# Patient Record
Sex: Male | Born: 1935 | Race: White | Hispanic: No | Smoking: Former smoker
Health system: Southern US, Community
[De-identification: ages and names within clinical notes are randomized; demographics above are authoritative.]

## PROBLEM LIST (undated history)

## (undated) DIAGNOSIS — I10 Essential (primary) hypertension: Secondary | ICD-10-CM

## (undated) DIAGNOSIS — N183 Chronic kidney disease, stage 3 unspecified: Secondary | ICD-10-CM

## (undated) DIAGNOSIS — C801 Malignant (primary) neoplasm, unspecified: Secondary | ICD-10-CM

## (undated) DIAGNOSIS — N3281 Overactive bladder: Secondary | ICD-10-CM

## (undated) DIAGNOSIS — N401 Enlarged prostate with lower urinary tract symptoms: Secondary | ICD-10-CM

## (undated) DIAGNOSIS — K2900 Acute gastritis without bleeding: Secondary | ICD-10-CM

## (undated) DIAGNOSIS — E119 Type 2 diabetes mellitus without complications: Secondary | ICD-10-CM

## (undated) DIAGNOSIS — H269 Unspecified cataract: Secondary | ICD-10-CM

## (undated) DIAGNOSIS — I495 Sick sinus syndrome: Secondary | ICD-10-CM

## (undated) DIAGNOSIS — K5900 Constipation, unspecified: Secondary | ICD-10-CM

## (undated) DIAGNOSIS — Z9109 Other allergy status, other than to drugs and biological substances: Secondary | ICD-10-CM

## (undated) DIAGNOSIS — K5730 Diverticulosis of large intestine without perforation or abscess without bleeding: Secondary | ICD-10-CM

## (undated) DIAGNOSIS — K224 Dyskinesia of esophagus: Secondary | ICD-10-CM

## (undated) DIAGNOSIS — I5030 Unspecified diastolic (congestive) heart failure: Secondary | ICD-10-CM

## (undated) DIAGNOSIS — E538 Deficiency of other specified B group vitamins: Secondary | ICD-10-CM

## (undated) DIAGNOSIS — K222 Esophageal obstruction: Secondary | ICD-10-CM

## (undated) DIAGNOSIS — K5790 Diverticulosis of intestine, part unspecified, without perforation or abscess without bleeding: Secondary | ICD-10-CM

## (undated) DIAGNOSIS — M1990 Unspecified osteoarthritis, unspecified site: Secondary | ICD-10-CM

## (undated) DIAGNOSIS — K219 Gastro-esophageal reflux disease without esophagitis: Secondary | ICD-10-CM

## (undated) DIAGNOSIS — Z86010 Personal history of colonic polyps: Secondary | ICD-10-CM

## (undated) DIAGNOSIS — I4891 Unspecified atrial fibrillation: Secondary | ICD-10-CM

## (undated) DIAGNOSIS — I441 Atrioventricular block, second degree: Secondary | ICD-10-CM

## (undated) DIAGNOSIS — Z800 Family history of malignant neoplasm of digestive organs: Secondary | ICD-10-CM

## (undated) DIAGNOSIS — Z950 Presence of cardiac pacemaker: Secondary | ICD-10-CM

## (undated) DIAGNOSIS — K449 Diaphragmatic hernia without obstruction or gangrene: Secondary | ICD-10-CM

## (undated) DIAGNOSIS — K56609 Unspecified intestinal obstruction, unspecified as to partial versus complete obstruction: Secondary | ICD-10-CM

## (undated) DIAGNOSIS — Z85828 Personal history of other malignant neoplasm of skin: Secondary | ICD-10-CM

## (undated) DIAGNOSIS — N138 Other obstructive and reflux uropathy: Secondary | ICD-10-CM

## (undated) HISTORY — DX: Benign prostatic hyperplasia with lower urinary tract symptoms: N40.1

## (undated) HISTORY — DX: Chronic kidney disease, stage 3 (moderate): N18.3

## (undated) HISTORY — DX: Presence of cardiac pacemaker: Z95.0

## (undated) HISTORY — DX: Unspecified atrial fibrillation: I48.91

## (undated) HISTORY — DX: Overactive bladder: N32.81

## (undated) HISTORY — DX: Dyskinesia of esophagus: K22.4

## (undated) HISTORY — DX: Sick sinus syndrome: I49.5

## (undated) HISTORY — DX: Chronic kidney disease, stage 3 unspecified: N18.30

## (undated) HISTORY — DX: Type 2 diabetes mellitus without complications: E11.9

## (undated) HISTORY — DX: Deficiency of other specified B group vitamins: E53.8

## (undated) HISTORY — PX: TOTAL HIP ARTHROPLASTY: SHX124

## (undated) HISTORY — PX: APPENDECTOMY: SHX54

## (undated) HISTORY — DX: Other obstructive and reflux uropathy: N13.8

## (undated) HISTORY — PX: EYE SURGERY: SHX253

## (undated) HISTORY — PX: PROSTATE SURGERY: SHX751

## (undated) HISTORY — PX: LAPAROSCOPY: SHX197

## (undated) HISTORY — DX: Unspecified osteoarthritis, unspecified site: M19.90

## (undated) HISTORY — DX: Acute gastritis without bleeding: K29.00

## (undated) HISTORY — DX: Atrioventricular block, second degree: I44.1

## (undated) HISTORY — DX: Unspecified cataract: H26.9

## (undated) HISTORY — PX: TONSILLECTOMY: SUR1361

## (undated) HISTORY — DX: Esophageal obstruction: K22.2

## (undated) HISTORY — DX: Essential (primary) hypertension: I10

## (undated) HISTORY — DX: Diverticulosis of intestine, part unspecified, without perforation or abscess without bleeding: K57.90

## (undated) HISTORY — DX: Personal history of colonic polyps: Z86.010

## (undated) HISTORY — DX: Family history of malignant neoplasm of digestive organs: Z80.0

## (undated) HISTORY — DX: Diaphragmatic hernia without obstruction or gangrene: K44.9

## (undated) HISTORY — DX: Diverticulosis of large intestine without perforation or abscess without bleeding: K57.30

---

## 1998-11-05 ENCOUNTER — Ambulatory Visit (HOSPITAL_COMMUNITY): Admission: RE | Admit: 1998-11-05 | Discharge: 1998-11-05 | Payer: Self-pay | Admitting: Orthopedic Surgery

## 1998-11-05 ENCOUNTER — Encounter: Payer: Self-pay | Admitting: Orthopedic Surgery

## 1998-11-20 ENCOUNTER — Encounter: Payer: Self-pay | Admitting: Orthopedic Surgery

## 1998-11-20 ENCOUNTER — Ambulatory Visit (HOSPITAL_COMMUNITY): Admission: RE | Admit: 1998-11-20 | Discharge: 1998-11-20 | Payer: Self-pay | Admitting: Orthopedic Surgery

## 1998-12-04 ENCOUNTER — Ambulatory Visit (HOSPITAL_COMMUNITY): Admission: RE | Admit: 1998-12-04 | Discharge: 1998-12-04 | Payer: Self-pay | Admitting: Orthopedic Surgery

## 1998-12-04 ENCOUNTER — Encounter: Payer: Self-pay | Admitting: Orthopedic Surgery

## 1999-01-08 ENCOUNTER — Encounter: Admission: RE | Admit: 1999-01-08 | Discharge: 1999-04-08 | Payer: Self-pay | Admitting: Family Medicine

## 1999-06-10 ENCOUNTER — Encounter: Admission: RE | Admit: 1999-06-10 | Discharge: 1999-06-17 | Payer: Self-pay | Admitting: Family Medicine

## 2003-02-20 ENCOUNTER — Encounter: Admission: RE | Admit: 2003-02-20 | Discharge: 2003-02-20 | Payer: Self-pay | Admitting: Family Medicine

## 2003-03-22 ENCOUNTER — Encounter: Admission: RE | Admit: 2003-03-22 | Discharge: 2003-03-22 | Payer: Self-pay | Admitting: Family Medicine

## 2003-04-26 ENCOUNTER — Encounter: Admission: RE | Admit: 2003-04-26 | Discharge: 2003-04-26 | Payer: Self-pay | Admitting: Family Medicine

## 2003-04-26 ENCOUNTER — Ambulatory Visit (HOSPITAL_COMMUNITY): Admission: RE | Admit: 2003-04-26 | Discharge: 2003-04-26 | Payer: Self-pay | Admitting: Family Medicine

## 2003-06-06 ENCOUNTER — Encounter: Admission: RE | Admit: 2003-06-06 | Discharge: 2003-06-06 | Payer: Self-pay | Admitting: Family Medicine

## 2003-12-29 ENCOUNTER — Inpatient Hospital Stay (HOSPITAL_COMMUNITY): Admission: EM | Admit: 2003-12-29 | Discharge: 2003-12-31 | Payer: Self-pay | Admitting: Emergency Medicine

## 2004-01-15 ENCOUNTER — Encounter: Admission: RE | Admit: 2004-01-15 | Discharge: 2004-01-15 | Payer: Self-pay | Admitting: Family Medicine

## 2004-04-29 ENCOUNTER — Encounter: Admission: RE | Admit: 2004-04-29 | Discharge: 2004-04-29 | Payer: Self-pay | Admitting: Sports Medicine

## 2004-04-29 ENCOUNTER — Encounter: Admission: RE | Admit: 2004-04-29 | Discharge: 2004-04-29 | Payer: Self-pay | Admitting: Family Medicine

## 2004-05-30 ENCOUNTER — Encounter: Admission: RE | Admit: 2004-05-30 | Discharge: 2004-05-30 | Payer: Self-pay | Admitting: Family Medicine

## 2004-10-21 ENCOUNTER — Ambulatory Visit: Payer: Self-pay | Admitting: Sports Medicine

## 2004-11-08 ENCOUNTER — Ambulatory Visit: Payer: Self-pay | Admitting: Gastroenterology

## 2004-11-17 HISTORY — PX: PACEMAKER INSERTION: SHX728

## 2004-11-25 ENCOUNTER — Ambulatory Visit: Payer: Self-pay | Admitting: Gastroenterology

## 2005-01-16 ENCOUNTER — Ambulatory Visit: Payer: Self-pay | Admitting: Family Medicine

## 2005-04-16 ENCOUNTER — Ambulatory Visit: Payer: Self-pay | Admitting: Family Medicine

## 2005-04-18 ENCOUNTER — Encounter: Admission: RE | Admit: 2005-04-18 | Discharge: 2005-04-18 | Payer: Self-pay | Admitting: Family Medicine

## 2005-04-24 ENCOUNTER — Encounter: Admission: RE | Admit: 2005-04-24 | Discharge: 2005-04-24 | Payer: Self-pay | Admitting: Family Medicine

## 2005-04-25 ENCOUNTER — Encounter: Admission: RE | Admit: 2005-04-25 | Discharge: 2005-05-23 | Payer: Self-pay | Admitting: Sports Medicine

## 2005-05-21 ENCOUNTER — Ambulatory Visit: Payer: Self-pay | Admitting: Family Medicine

## 2005-08-28 ENCOUNTER — Ambulatory Visit: Payer: Self-pay | Admitting: Sports Medicine

## 2005-10-02 ENCOUNTER — Ambulatory Visit: Payer: Self-pay | Admitting: Family Medicine

## 2005-10-02 ENCOUNTER — Ambulatory Visit: Payer: Self-pay | Admitting: Internal Medicine

## 2005-10-02 ENCOUNTER — Inpatient Hospital Stay (HOSPITAL_COMMUNITY): Admission: EM | Admit: 2005-10-02 | Discharge: 2005-10-15 | Payer: Self-pay | Admitting: Emergency Medicine

## 2005-10-03 ENCOUNTER — Encounter (INDEPENDENT_AMBULATORY_CARE_PROVIDER_SITE_OTHER): Payer: Self-pay | Admitting: *Deleted

## 2005-10-03 ENCOUNTER — Encounter: Payer: Self-pay | Admitting: Cardiology

## 2005-10-03 ENCOUNTER — Ambulatory Visit: Payer: Self-pay | Admitting: Cardiology

## 2005-10-08 ENCOUNTER — Encounter (INDEPENDENT_AMBULATORY_CARE_PROVIDER_SITE_OTHER): Payer: Self-pay | Admitting: *Deleted

## 2005-10-14 DIAGNOSIS — Z950 Presence of cardiac pacemaker: Secondary | ICD-10-CM

## 2005-10-14 HISTORY — DX: Presence of cardiac pacemaker: Z95.0

## 2005-11-05 ENCOUNTER — Ambulatory Visit: Payer: Self-pay | Admitting: Family Medicine

## 2005-11-27 ENCOUNTER — Ambulatory Visit: Payer: Self-pay | Admitting: Family Medicine

## 2005-12-12 ENCOUNTER — Ambulatory Visit: Payer: Self-pay | Admitting: Sports Medicine

## 2006-01-20 ENCOUNTER — Ambulatory Visit: Payer: Self-pay | Admitting: Family Medicine

## 2006-02-20 ENCOUNTER — Ambulatory Visit: Payer: Self-pay | Admitting: Family Medicine

## 2006-02-27 ENCOUNTER — Ambulatory Visit: Payer: Self-pay | Admitting: Sports Medicine

## 2006-03-04 ENCOUNTER — Ambulatory Visit: Payer: Self-pay | Admitting: Family Medicine

## 2006-03-07 ENCOUNTER — Observation Stay (HOSPITAL_COMMUNITY): Admission: EM | Admit: 2006-03-07 | Discharge: 2006-03-07 | Payer: Self-pay | Admitting: Emergency Medicine

## 2006-04-03 ENCOUNTER — Ambulatory Visit: Payer: Self-pay | Admitting: Family Medicine

## 2006-04-10 ENCOUNTER — Ambulatory Visit: Payer: Self-pay | Admitting: Family Medicine

## 2006-04-23 ENCOUNTER — Encounter: Admission: RE | Admit: 2006-04-23 | Discharge: 2006-05-21 | Payer: Self-pay | Admitting: Sports Medicine

## 2006-05-22 ENCOUNTER — Encounter: Admission: RE | Admit: 2006-05-22 | Discharge: 2006-06-18 | Payer: Self-pay | Admitting: Sports Medicine

## 2006-06-19 ENCOUNTER — Encounter: Admission: RE | Admit: 2006-06-19 | Discharge: 2006-07-02 | Payer: Self-pay | Admitting: Sports Medicine

## 2006-06-23 ENCOUNTER — Ambulatory Visit: Payer: Self-pay | Admitting: Sports Medicine

## 2006-07-10 ENCOUNTER — Ambulatory Visit: Payer: Self-pay | Admitting: Family Medicine

## 2006-08-28 ENCOUNTER — Ambulatory Visit: Payer: Self-pay | Admitting: Family Medicine

## 2006-10-06 ENCOUNTER — Ambulatory Visit: Payer: Self-pay | Admitting: Sports Medicine

## 2006-10-07 ENCOUNTER — Encounter: Admission: RE | Admit: 2006-10-07 | Discharge: 2006-10-07 | Payer: Self-pay | Admitting: Sports Medicine

## 2006-10-09 ENCOUNTER — Ambulatory Visit: Payer: Self-pay | Admitting: Gastroenterology

## 2006-10-21 ENCOUNTER — Ambulatory Visit: Payer: Self-pay | Admitting: Gastroenterology

## 2006-10-21 ENCOUNTER — Encounter (INDEPENDENT_AMBULATORY_CARE_PROVIDER_SITE_OTHER): Payer: Self-pay | Admitting: *Deleted

## 2006-10-21 DIAGNOSIS — Z860100 Personal history of colon polyps, unspecified: Secondary | ICD-10-CM

## 2006-10-21 HISTORY — DX: Personal history of colon polyps, unspecified: Z86.0100

## 2006-10-21 HISTORY — DX: Personal history of colonic polyps: Z86.010

## 2006-10-27 ENCOUNTER — Ambulatory Visit: Payer: Self-pay | Admitting: Sports Medicine

## 2006-11-03 ENCOUNTER — Encounter: Admission: RE | Admit: 2006-11-03 | Discharge: 2006-11-03 | Payer: Self-pay | Admitting: Sports Medicine

## 2007-01-06 ENCOUNTER — Ambulatory Visit: Payer: Self-pay

## 2007-01-14 DIAGNOSIS — K219 Gastro-esophageal reflux disease without esophagitis: Secondary | ICD-10-CM

## 2007-01-14 DIAGNOSIS — H269 Unspecified cataract: Secondary | ICD-10-CM

## 2007-01-14 DIAGNOSIS — I495 Sick sinus syndrome: Secondary | ICD-10-CM

## 2007-01-14 DIAGNOSIS — H409 Unspecified glaucoma: Secondary | ICD-10-CM | POA: Insufficient documentation

## 2007-01-14 DIAGNOSIS — N400 Enlarged prostate without lower urinary tract symptoms: Secondary | ICD-10-CM | POA: Insufficient documentation

## 2007-01-25 ENCOUNTER — Ambulatory Visit: Payer: Self-pay | Admitting: Physical Medicine & Rehabilitation

## 2007-01-25 ENCOUNTER — Encounter
Admission: RE | Admit: 2007-01-25 | Discharge: 2007-04-25 | Payer: Self-pay | Admitting: Physical Medicine & Rehabilitation

## 2007-02-02 ENCOUNTER — Ambulatory Visit: Payer: Self-pay | Admitting: Sports Medicine

## 2007-02-02 LAB — CONVERTED CEMR LAB
BUN: 28 mg/dL — ABNORMAL HIGH (ref 6–23)
CO2: 24 meq/L (ref 19–32)
Calcium: 9.6 mg/dL (ref 8.4–10.5)
Creatinine, Ser: 1.3 mg/dL (ref 0.40–1.50)
Glucose, Bld: 83 mg/dL (ref 70–99)

## 2007-02-10 ENCOUNTER — Encounter: Admission: RE | Admit: 2007-02-10 | Discharge: 2007-02-10 | Payer: Self-pay | Admitting: Sports Medicine

## 2007-04-06 ENCOUNTER — Ambulatory Visit: Payer: Self-pay | Admitting: Sports Medicine

## 2007-04-06 LAB — CONVERTED CEMR LAB
Albumin: 4.2 g/dL (ref 3.5–5.2)
Alkaline Phosphatase: 49 units/L (ref 39–117)
BUN: 24 mg/dL — ABNORMAL HIGH (ref 6–23)
CO2: 23 meq/L (ref 19–32)
Cortisol, Plasma: 10.5 ug/dL
Glucose, Bld: 87 mg/dL (ref 70–99)
Hgb A1c MFr Bld: 6 %
MCV: 92.9 fL
Potassium: 4.8 meq/L (ref 3.5–5.3)
RBC: 3.46 M/uL
TSH: 3.688 microintl units/mL (ref 0.350–5.50)
Total Bilirubin: 0.4 mg/dL (ref 0.3–1.2)
WBC: 7.1 10*3/uL

## 2007-04-13 ENCOUNTER — Telehealth: Payer: Self-pay | Admitting: *Deleted

## 2007-05-24 ENCOUNTER — Encounter: Payer: Self-pay | Admitting: Family Medicine

## 2007-06-17 ENCOUNTER — Encounter: Admission: RE | Admit: 2007-06-17 | Discharge: 2007-06-17 | Payer: Self-pay | Admitting: Orthopedic Surgery

## 2007-07-07 ENCOUNTER — Ambulatory Visit: Payer: Self-pay | Admitting: Family Medicine

## 2007-07-07 LAB — CONVERTED CEMR LAB: Hgb A1c MFr Bld: 5.7 %

## 2007-07-21 ENCOUNTER — Inpatient Hospital Stay (HOSPITAL_COMMUNITY): Admission: RE | Admit: 2007-07-21 | Discharge: 2007-07-26 | Payer: Self-pay | Admitting: Orthopedic Surgery

## 2007-08-30 ENCOUNTER — Telehealth: Payer: Self-pay | Admitting: *Deleted

## 2007-09-01 ENCOUNTER — Ambulatory Visit: Payer: Self-pay | Admitting: Family Medicine

## 2007-09-09 ENCOUNTER — Encounter: Payer: Self-pay | Admitting: Family Medicine

## 2007-09-10 ENCOUNTER — Encounter: Payer: Self-pay | Admitting: Family Medicine

## 2007-09-22 ENCOUNTER — Ambulatory Visit: Payer: Self-pay | Admitting: Family Medicine

## 2007-09-23 ENCOUNTER — Encounter: Payer: Self-pay | Admitting: Family Medicine

## 2007-10-25 ENCOUNTER — Encounter: Payer: Self-pay | Admitting: Family Medicine

## 2007-11-26 ENCOUNTER — Ambulatory Visit: Payer: Self-pay | Admitting: Gastroenterology

## 2007-11-26 LAB — CONVERTED CEMR LAB
Basophils Absolute: 0 10*3/uL (ref 0.0–0.1)
Eosinophils Absolute: 0.4 10*3/uL (ref 0.0–0.6)
Folate: >20 ng/mL
HCT: 36.2 % — ABNORMAL LOW (ref 39.0–52.0)
Iron: 47 ug/dL (ref 42–165)
MCHC: 35.8 g/dL (ref 30.0–36.0)
Neutrophils Relative %: 72.6 % (ref 43.0–77.0)
Platelets: 211 10*3/uL (ref 150–400)
RBC: 3.93 M/uL — ABNORMAL LOW (ref 4.22–5.81)
RDW: 12.8 % (ref 11.5–14.6)
Transferrin: 247.3 mg/dL (ref >=212.0)
Vitamin B-12: 323 pg/mL (ref 211–911)

## 2007-12-01 ENCOUNTER — Telehealth: Payer: Self-pay | Admitting: *Deleted

## 2007-12-01 ENCOUNTER — Ambulatory Visit: Payer: Self-pay | Admitting: Family Medicine

## 2007-12-09 ENCOUNTER — Telehealth: Payer: Self-pay | Admitting: *Deleted

## 2007-12-13 ENCOUNTER — Encounter: Payer: Self-pay | Admitting: Family Medicine

## 2007-12-13 ENCOUNTER — Ambulatory Visit: Payer: Self-pay | Admitting: Gastroenterology

## 2008-01-25 ENCOUNTER — Telehealth (INDEPENDENT_AMBULATORY_CARE_PROVIDER_SITE_OTHER): Payer: Self-pay | Admitting: Family Medicine

## 2008-01-26 ENCOUNTER — Ambulatory Visit: Payer: Self-pay | Admitting: Family Medicine

## 2008-01-26 DIAGNOSIS — D518 Other vitamin B12 deficiency anemias: Secondary | ICD-10-CM

## 2008-01-27 ENCOUNTER — Encounter: Payer: Self-pay | Admitting: Family Medicine

## 2008-02-02 ENCOUNTER — Ambulatory Visit: Payer: Self-pay | Admitting: Family Medicine

## 2008-02-02 ENCOUNTER — Telehealth (INDEPENDENT_AMBULATORY_CARE_PROVIDER_SITE_OTHER): Payer: Self-pay | Admitting: Family Medicine

## 2008-02-02 ENCOUNTER — Telehealth: Payer: Self-pay | Admitting: *Deleted

## 2008-02-02 ENCOUNTER — Encounter: Admission: RE | Admit: 2008-02-02 | Discharge: 2008-02-02 | Payer: Self-pay | Admitting: Family Medicine

## 2008-02-02 LAB — CONVERTED CEMR LAB
BUN: 21 mg/dL (ref 6–23)
CO2: 21 meq/L (ref 19–32)
Calcium: 9.6 mg/dL (ref 8.4–10.5)
Chloride: 98 meq/L (ref 96–112)
Creatinine, Ser: 1.23 mg/dL (ref 0.40–1.50)
Glucose, Bld: 105 mg/dL — ABNORMAL HIGH (ref 70–99)

## 2008-02-07 ENCOUNTER — Encounter: Payer: Self-pay | Admitting: Family Medicine

## 2008-02-14 ENCOUNTER — Telehealth: Payer: Self-pay | Admitting: Family Medicine

## 2008-02-24 ENCOUNTER — Ambulatory Visit: Payer: Self-pay | Admitting: Family Medicine

## 2008-03-22 ENCOUNTER — Ambulatory Visit: Payer: Self-pay | Admitting: Family Medicine

## 2008-03-22 LAB — CONVERTED CEMR LAB
ALT: 10 units/L (ref 0–53)
AST: 13 units/L (ref 0–37)
Alkaline Phosphatase: 51 units/L (ref 39–117)
BUN: 23 mg/dL (ref 6–23)
Calcium: 9.4 mg/dL (ref 8.4–10.5)
Creatinine, Ser: 1.39 mg/dL (ref 0.40–1.50)
Total Bilirubin: 0.4 mg/dL (ref 0.3–1.2)

## 2008-03-23 ENCOUNTER — Telehealth: Payer: Self-pay | Admitting: *Deleted

## 2008-03-23 ENCOUNTER — Encounter: Admission: RE | Admit: 2008-03-23 | Discharge: 2008-03-23 | Payer: Self-pay | Admitting: Family Medicine

## 2008-04-12 ENCOUNTER — Ambulatory Visit: Payer: Self-pay | Admitting: Family Medicine

## 2008-05-24 ENCOUNTER — Ambulatory Visit: Payer: Self-pay | Admitting: Family Medicine

## 2008-06-27 ENCOUNTER — Ambulatory Visit: Payer: Self-pay | Admitting: Family Medicine

## 2008-07-05 ENCOUNTER — Ambulatory Visit: Payer: Self-pay | Admitting: Family Medicine

## 2008-07-05 LAB — CONVERTED CEMR LAB
AST: 12 units/L (ref 0–37)
Albumin: 4.8 g/dL (ref 3.5–5.2)
BUN: 24 mg/dL — ABNORMAL HIGH (ref 6–23)
CO2: 24 meq/L (ref 19–32)
Calcium: 9.2 mg/dL (ref 8.4–10.5)
Chloride: 104 meq/L (ref 96–112)
Cholesterol: 152 mg/dL (ref 0–200)
Creatinine, Ser: 1.44 mg/dL (ref 0.40–1.50)
HDL: 28 mg/dL — ABNORMAL LOW (ref >39)
Potassium: 5.3 meq/L (ref 3.5–5.3)
Total CHOL/HDL Ratio: 5.4

## 2008-07-06 ENCOUNTER — Encounter: Payer: Self-pay | Admitting: Family Medicine

## 2008-07-07 ENCOUNTER — Encounter: Admission: RE | Admit: 2008-07-07 | Discharge: 2008-07-07 | Payer: Self-pay | Admitting: Family Medicine

## 2008-07-10 ENCOUNTER — Telehealth (INDEPENDENT_AMBULATORY_CARE_PROVIDER_SITE_OTHER): Payer: Self-pay | Admitting: Family Medicine

## 2008-07-28 ENCOUNTER — Ambulatory Visit: Payer: Self-pay | Admitting: Family Medicine

## 2008-08-07 ENCOUNTER — Encounter: Payer: Self-pay | Admitting: Family Medicine

## 2008-08-28 ENCOUNTER — Ambulatory Visit: Payer: Self-pay | Admitting: Family Medicine

## 2008-10-04 ENCOUNTER — Ambulatory Visit: Payer: Self-pay | Admitting: Family Medicine

## 2008-10-04 DIAGNOSIS — K222 Esophageal obstruction: Secondary | ICD-10-CM | POA: Insufficient documentation

## 2008-10-04 LAB — CONVERTED CEMR LAB: Hgb A1c MFr Bld: 6.2 %

## 2008-11-01 ENCOUNTER — Ambulatory Visit: Payer: Self-pay | Admitting: Family Medicine

## 2008-11-14 ENCOUNTER — Encounter: Payer: Self-pay | Admitting: Family Medicine

## 2008-12-06 ENCOUNTER — Ambulatory Visit: Payer: Self-pay | Admitting: Family Medicine

## 2008-12-14 DIAGNOSIS — Z860100 Personal history of colon polyps, unspecified: Secondary | ICD-10-CM | POA: Insufficient documentation

## 2008-12-14 DIAGNOSIS — K449 Diaphragmatic hernia without obstruction or gangrene: Secondary | ICD-10-CM | POA: Insufficient documentation

## 2008-12-14 DIAGNOSIS — K5730 Diverticulosis of large intestine without perforation or abscess without bleeding: Secondary | ICD-10-CM

## 2008-12-15 ENCOUNTER — Ambulatory Visit: Payer: Self-pay | Admitting: Gastroenterology

## 2008-12-20 ENCOUNTER — Ambulatory Visit: Payer: Self-pay | Admitting: Gastroenterology

## 2009-01-03 ENCOUNTER — Ambulatory Visit: Payer: Self-pay | Admitting: Family Medicine

## 2009-02-01 ENCOUNTER — Ambulatory Visit: Payer: Self-pay | Admitting: Family Medicine

## 2009-02-21 ENCOUNTER — Ambulatory Visit: Payer: Self-pay | Admitting: Family Medicine

## 2009-03-06 ENCOUNTER — Ambulatory Visit: Payer: Self-pay | Admitting: Family Medicine

## 2009-04-06 ENCOUNTER — Ambulatory Visit: Payer: Self-pay | Admitting: Family Medicine

## 2009-04-11 ENCOUNTER — Ambulatory Visit: Payer: Self-pay | Admitting: Family Medicine

## 2009-04-11 DIAGNOSIS — M25559 Pain in unspecified hip: Secondary | ICD-10-CM

## 2009-05-03 ENCOUNTER — Telehealth: Payer: Self-pay | Admitting: Family Medicine

## 2009-05-03 ENCOUNTER — Ambulatory Visit: Payer: Self-pay | Admitting: Family Medicine

## 2009-05-08 ENCOUNTER — Ambulatory Visit: Payer: Self-pay | Admitting: Family Medicine

## 2009-05-15 ENCOUNTER — Ambulatory Visit: Payer: Self-pay | Admitting: Family Medicine

## 2009-05-15 DIAGNOSIS — R498 Other voice and resonance disorders: Secondary | ICD-10-CM | POA: Insufficient documentation

## 2009-05-29 ENCOUNTER — Ambulatory Visit: Payer: Self-pay | Admitting: Gastroenterology

## 2009-05-29 DIAGNOSIS — R1310 Dysphagia, unspecified: Secondary | ICD-10-CM | POA: Insufficient documentation

## 2009-05-29 LAB — CONVERTED CEMR LAB
Basophils Absolute: 0 10*3/uL (ref 0.0–0.1)
Eosinophils Absolute: 0.1 10*3/uL (ref 0.0–0.7)
Ferritin: 128.8 ng/mL (ref 22.0–322.0)
Folate: >20 ng/mL
HCT: 35.8 % — ABNORMAL LOW (ref 39.0–52.0)
Hemoglobin: 12.3 g/dL — ABNORMAL LOW (ref 13.0–17.0)
Lymphs Abs: 0.9 10*3/uL (ref 0.7–4.0)
MCHC: 34.3 g/dL (ref 30.0–36.0)
Monocytes Absolute: 0.7 10*3/uL (ref 0.1–1.0)
Neutro Abs: 6.7 10*3/uL (ref 1.4–7.7)
Platelets: 219 10*3/uL (ref 150.0–400.0)
RDW: 13.3 % (ref 11.5–14.6)
Vitamin B-12: 643 pg/mL (ref 211–911)

## 2009-05-31 ENCOUNTER — Telehealth: Payer: Self-pay | Admitting: Gastroenterology

## 2009-05-31 ENCOUNTER — Encounter: Payer: Self-pay | Admitting: Gastroenterology

## 2009-06-07 ENCOUNTER — Telehealth: Payer: Self-pay | Admitting: Gastroenterology

## 2009-06-13 ENCOUNTER — Ambulatory Visit: Payer: Self-pay | Admitting: Family Medicine

## 2009-06-13 LAB — CONVERTED CEMR LAB
Calcium: 9.2 mg/dL (ref 8.4–10.5)
Creatinine, Ser: 1.2 mg/dL (ref 0.40–1.50)
Sodium: 130 meq/L — ABNORMAL LOW (ref 135–145)

## 2009-06-18 ENCOUNTER — Encounter: Payer: Self-pay | Admitting: Family Medicine

## 2009-06-28 ENCOUNTER — Ambulatory Visit: Payer: Self-pay | Admitting: Family Medicine

## 2009-06-29 ENCOUNTER — Ambulatory Visit: Payer: Self-pay | Admitting: Gastroenterology

## 2009-07-05 ENCOUNTER — Ambulatory Visit: Payer: Self-pay | Admitting: Family Medicine

## 2009-07-05 LAB — CONVERTED CEMR LAB

## 2009-07-09 ENCOUNTER — Telehealth: Payer: Self-pay | Admitting: Gastroenterology

## 2009-07-10 ENCOUNTER — Telehealth: Payer: Self-pay | Admitting: Family Medicine

## 2009-07-10 ENCOUNTER — Ambulatory Visit: Payer: Self-pay | Admitting: Family Medicine

## 2009-07-10 DIAGNOSIS — R0609 Other forms of dyspnea: Secondary | ICD-10-CM

## 2009-07-10 DIAGNOSIS — R0989 Other specified symptoms and signs involving the circulatory and respiratory systems: Secondary | ICD-10-CM

## 2009-07-10 LAB — CONVERTED CEMR LAB
BUN: 33 mg/dL — ABNORMAL HIGH (ref 6–23)
Chloride: 97 meq/L (ref 96–112)
Potassium: 5.4 meq/L — ABNORMAL HIGH (ref 3.5–5.3)
Sodium: 130 meq/L — ABNORMAL LOW (ref 135–145)

## 2009-07-16 ENCOUNTER — Ambulatory Visit: Payer: Self-pay | Admitting: Family Medicine

## 2009-07-16 ENCOUNTER — Encounter: Payer: Self-pay | Admitting: Family Medicine

## 2009-07-16 ENCOUNTER — Encounter: Admission: RE | Admit: 2009-07-16 | Discharge: 2009-07-16 | Payer: Self-pay | Admitting: Family Medicine

## 2009-07-16 ENCOUNTER — Telehealth (INDEPENDENT_AMBULATORY_CARE_PROVIDER_SITE_OTHER): Payer: Self-pay | Admitting: *Deleted

## 2009-07-16 DIAGNOSIS — R0602 Shortness of breath: Secondary | ICD-10-CM

## 2009-07-26 ENCOUNTER — Telehealth: Payer: Self-pay | Admitting: Family Medicine

## 2009-07-30 ENCOUNTER — Encounter: Payer: Self-pay | Admitting: Family Medicine

## 2009-07-30 ENCOUNTER — Ambulatory Visit: Payer: Self-pay | Admitting: Family Medicine

## 2009-07-31 LAB — CONVERTED CEMR LAB
CO2: 21 meq/L (ref 19–32)
Chloride: 99 meq/L (ref 96–112)
Glucose, Bld: 109 mg/dL — ABNORMAL HIGH (ref 70–99)
Potassium: 5 meq/L (ref 3.5–5.3)
Sodium: 133 meq/L — ABNORMAL LOW (ref 135–145)

## 2009-08-08 ENCOUNTER — Ambulatory Visit: Payer: Self-pay | Admitting: Family Medicine

## 2009-08-17 ENCOUNTER — Ambulatory Visit: Payer: Self-pay | Admitting: Family Medicine

## 2009-09-05 ENCOUNTER — Ambulatory Visit: Payer: Self-pay | Admitting: Family Medicine

## 2009-09-05 DIAGNOSIS — J309 Allergic rhinitis, unspecified: Secondary | ICD-10-CM | POA: Insufficient documentation

## 2009-09-20 ENCOUNTER — Ambulatory Visit: Payer: Self-pay | Admitting: Family Medicine

## 2009-10-10 ENCOUNTER — Ambulatory Visit: Payer: Self-pay | Admitting: Family Medicine

## 2009-10-22 ENCOUNTER — Ambulatory Visit: Payer: Self-pay | Admitting: Family Medicine

## 2009-11-22 ENCOUNTER — Ambulatory Visit: Payer: Self-pay | Admitting: Family Medicine

## 2009-12-26 ENCOUNTER — Ambulatory Visit: Payer: Self-pay | Admitting: Family Medicine

## 2009-12-27 ENCOUNTER — Encounter: Payer: Self-pay | Admitting: Family Medicine

## 2009-12-27 LAB — CONVERTED CEMR LAB
BUN: 23 mg/dL (ref 6–23)
CO2: 26 meq/L (ref 19–32)
Calcium: 8.9 mg/dL (ref 8.4–10.5)
Glucose, Bld: 115 mg/dL — ABNORMAL HIGH (ref 70–99)
Sodium: 133 meq/L — ABNORMAL LOW (ref 135–145)

## 2010-01-25 ENCOUNTER — Ambulatory Visit: Payer: Self-pay | Admitting: Family Medicine

## 2010-02-11 ENCOUNTER — Encounter: Payer: Self-pay | Admitting: Family Medicine

## 2010-02-25 ENCOUNTER — Ambulatory Visit: Payer: Self-pay | Admitting: Family Medicine

## 2010-02-26 ENCOUNTER — Encounter: Payer: Self-pay | Admitting: Family Medicine

## 2010-03-05 ENCOUNTER — Encounter: Payer: Self-pay | Admitting: Family Medicine

## 2010-03-13 ENCOUNTER — Encounter: Payer: Self-pay | Admitting: Family Medicine

## 2010-03-29 ENCOUNTER — Ambulatory Visit: Payer: Self-pay | Admitting: Family Medicine

## 2010-05-02 ENCOUNTER — Encounter (INDEPENDENT_AMBULATORY_CARE_PROVIDER_SITE_OTHER): Payer: Self-pay | Admitting: *Deleted

## 2010-05-09 ENCOUNTER — Ambulatory Visit: Payer: Self-pay | Admitting: Family Medicine

## 2010-05-16 ENCOUNTER — Ambulatory Visit: Payer: Self-pay | Admitting: Gastroenterology

## 2010-05-22 ENCOUNTER — Ambulatory Visit: Payer: Self-pay | Admitting: Gastroenterology

## 2010-05-22 DIAGNOSIS — K2900 Acute gastritis without bleeding: Secondary | ICD-10-CM | POA: Insufficient documentation

## 2010-05-29 ENCOUNTER — Ambulatory Visit: Payer: Self-pay | Admitting: Family Medicine

## 2010-05-29 ENCOUNTER — Telehealth: Payer: Self-pay | Admitting: Family Medicine

## 2010-05-29 DIAGNOSIS — R609 Edema, unspecified: Secondary | ICD-10-CM

## 2010-05-29 LAB — CONVERTED CEMR LAB
Albumin: 4.3 g/dL (ref 3.5–5.2)
Alkaline Phosphatase: 63 units/L (ref 39–117)
BUN: 28 mg/dL — ABNORMAL HIGH (ref 6–23)
Calcium: 9.5 mg/dL (ref 8.4–10.5)
Creatinine, Ser: 1.45 mg/dL (ref 0.40–1.50)
Glucose, Bld: 119 mg/dL — ABNORMAL HIGH (ref 70–99)
Potassium: 5 meq/L (ref 3.5–5.3)

## 2010-05-30 ENCOUNTER — Encounter: Payer: Self-pay | Admitting: Family Medicine

## 2010-06-10 ENCOUNTER — Ambulatory Visit: Payer: Self-pay | Admitting: Family Medicine

## 2010-07-11 ENCOUNTER — Ambulatory Visit: Payer: Self-pay | Admitting: Family Medicine

## 2010-08-14 ENCOUNTER — Ambulatory Visit: Payer: Self-pay | Admitting: Family Medicine

## 2010-09-13 ENCOUNTER — Ambulatory Visit: Payer: Self-pay | Admitting: Family Medicine

## 2010-09-17 ENCOUNTER — Telehealth (INDEPENDENT_AMBULATORY_CARE_PROVIDER_SITE_OTHER): Payer: Self-pay | Admitting: *Deleted

## 2010-09-18 ENCOUNTER — Ambulatory Visit: Payer: Self-pay | Admitting: Family Medicine

## 2010-09-18 ENCOUNTER — Encounter: Payer: Self-pay | Admitting: Family Medicine

## 2010-09-25 ENCOUNTER — Encounter: Payer: Self-pay | Admitting: Family Medicine

## 2010-10-16 ENCOUNTER — Ambulatory Visit: Payer: Self-pay | Admitting: Family Medicine

## 2010-11-20 ENCOUNTER — Ambulatory Visit
Admission: RE | Admit: 2010-11-20 | Discharge: 2010-11-20 | Payer: Self-pay | Source: Home / Self Care | Attending: Family Medicine | Admitting: Family Medicine

## 2010-11-27 ENCOUNTER — Encounter: Payer: Self-pay | Admitting: Family Medicine

## 2010-11-27 ENCOUNTER — Ambulatory Visit: Admission: RE | Admit: 2010-11-27 | Discharge: 2010-11-27 | Payer: Self-pay | Source: Home / Self Care

## 2010-11-27 LAB — CONVERTED CEMR LAB
ALT: 13 units/L (ref 0–53)
AST: 14 units/L (ref 0–37)
Albumin: 4.8 g/dL (ref 3.5–5.2)
Calcium: 9.9 mg/dL (ref 8.4–10.5)
Chloride: 103 meq/L (ref 96–112)
Platelets: 217 10*3/uL (ref 150–400)
Potassium: 5.3 meq/L (ref 3.5–5.3)
RDW: 14.4 % (ref 11.5–15.5)

## 2010-12-03 ENCOUNTER — Encounter: Payer: Self-pay | Admitting: Family Medicine

## 2010-12-03 DIAGNOSIS — N183 Chronic kidney disease, stage 3 unspecified: Secondary | ICD-10-CM | POA: Insufficient documentation

## 2010-12-08 ENCOUNTER — Encounter: Payer: Self-pay | Admitting: Orthopedic Surgery

## 2010-12-17 NOTE — Assessment & Plan Note (Signed)
Summary: b12/eo  B12 &  Flu shot given today Nurse Visit   Allergies: 1)  Codeine  Immunizations Administered:  Influenza Vaccine # 1:    Vaccine Type: Fluvax 3+    Site: left deltoid    Mfr: GlaxoSmithKline    Dose: 0.5 ml    Route: IM    Given by: Jimmy Footman, CMA    Exp. Date: 05/14/2011    Lot #: [id number]    VIS given: 06/11/10 version given August 14, 2010.  Flu Vaccine Consent Questions:    Do you have a history of severe allergic reactions to this vaccine? no    Any prior history of allergic reactions to egg and/or gelatin? no    Do you have a sensitivity to the preservative Thimersol? no    Do you have a past history of Guillan-Barre Syndrome? no    Do you currently have an acute febrile illness? no    Have you ever had a severe reaction to latex? no    Vaccine information given and explained to patient? yes  Medication Administration  Injection # 1:    Medication: Vit B12 1000 mcg    Diagnosis: OTHER VITAMIN B12 DEFICIENCY ANEMIA (ICD-281.1)    Route: IM    Site: R deltoid    Exp Date: 12/19/2011    Lot #: [id number]    Mfr: American Regent    Patient tolerated injection without complications    Given by: Jimmy Footman, CMA (August 14, 2010 2:29 PM)  Orders Added: 1)  Admin 1st Vaccine [90471] 2)  Flu Vaccine 66yrs + MEDICARE PATIENTS [Q2039] 3)  Vit B12 1000 mcg [J3420]

## 2010-12-17 NOTE — Assessment & Plan Note (Signed)
Summary: B12 inj,tcb   Nurse Visit   Allergies: 1)  Codeine  Medication Administration  Injection # 1:    Medication: Vit B12 1000 mcg    Diagnosis: OTHER VITAMIN B12 DEFICIENCY ANEMIA (ICD-281.1)    Route: IM    Site: R deltoid    Exp Date: 10/2010    Lot #: [id number]    Mfr: American Regent    Patient tolerated injection without complications    Given by: Theresia Lo RN (May 09, 2010 12:25 PM)  Orders Added: 1)  Vit B12 1000 mcg [J3420] 2)  Admin of Injection (IM/SQ) [96045]   Medication Administration  Injection # 1:    Medication: Vit B12 1000 mcg    Diagnosis: OTHER VITAMIN B12 DEFICIENCY ANEMIA (ICD-281.1)    Route: IM    Site: R deltoid    Exp Date: 10/2010    Lot #: [id number]    Mfr: American Regent    Patient tolerated injection without complications    Given by: Theresia Lo RN (May 09, 2010 12:25 PM)  Orders Added: 1)  Vit B12 1000 mcg [J3420] 2)  Admin of Injection (IM/SQ) [11914]  patient reports  he has had problem with feet swelling in the past but generally will go down at night. recently however swelling has continued in feet even in AM when he gets up. appointment scheduled with Dr. Jennette Kettle at patient's request for next available which is 05/29/2010. advised patient we can make a work in appointment sooner but  he says he will wait. advised to call back if he decides on a work in appointment. Theresia Lo RN  May 09, 2010 12:28 PM

## 2010-12-17 NOTE — Progress Notes (Signed)
 ----   Converted from flag ---- ---- 09/13/2010 3:00 PM, Theresia Lo RN wrote:  will you enter order for shingles vaccine. patient has found out that his insuance that is secondary will pay for it to be given here. Larita Fife ------------------------------  ok    New Problems: PREVENTIVE HEALTH CARE (ICD-V70.0)   New Problems: PREVENTIVE HEALTH CARE (ICD-V70.0)   Complete Medication List: 1)  Exforge 10-160 Mg Tabs (Amlodipine besylate-valsartan) .Marland Kitchen.. 1 by mouth once daily 2)  Protonix 40 Mg Tbec (Pantoprazole sodium) .... Take 1 tablet by mouth once a day 3)  Hydrochlorothiazide 25 Mg Tabs (Hydrochlorothiazide) .... Take one tablet by mouth every other day 4)  Altace 5 Mg Caps (Ramipril) .Marland Kitchen.. 1 by mouth once daily generic preferred 5)  Bayer Childrens Aspirin 81 Mg Chew (Aspirin) .... Take 1 tablet by mouth once a day 6)  Cyanocobalamin 1000 Mcg/ml Soln (Cyanocobalamin) .... Im injection once monthly 7)  Bystolic 10 Mg Tabs (Nebivolol hcl) .... One tablet by mouth once daily 8)  Vitamin B 12 Shot Q M   patient scheduled for tomorrow. Theresia Lo RN  September 17, 2010 5:00 PM

## 2010-12-17 NOTE — Assessment & Plan Note (Signed)
Summary: B12 inj,tcb   Nurse Visit   Allergies: 1)  Codeine  Medication Administration  Injection # 1:    Medication: Vit B12 1000 mcg    Diagnosis: OTHER VITAMIN B12 DEFICIENCY ANEMIA (ICD-281.1)    Route: IM    Site: L deltoid    Exp Date: 07/2011    Lot #: [id number]    Mfr: American Regent    Patient tolerated injection without complications    Given by: Theresia Lo RN (November 22, 2009 10:47 AM)  Orders Added: 1)  Vit B12 1000 mcg [J3420] 2)  Admin of Injection (IM/SQ) [16109]   Medication Administration  Injection # 1:    Medication: Vit B12 1000 mcg    Diagnosis: OTHER VITAMIN B12 DEFICIENCY ANEMIA (ICD-281.1)    Route: IM    Site: L deltoid    Exp Date: 07/2011    Lot #: [id number]    Mfr: American Regent    Patient tolerated injection without complications    Given by: Theresia Lo RN (November 22, 2009 10:47 AM)  Orders Added: 1)  Vit B12 1000 mcg [J3420] 2)  Admin of Injection (IM/SQ) [60454]

## 2010-12-17 NOTE — Assessment & Plan Note (Signed)
Summary: B12 inj,tcb   Nurse Visit   Allergies: 1)  Codeine  Medication Administration  Injection # 3:    Medication: Vit B12 1000 mcg    Diagnosis: OTHER VITAMIN B12 DEFICIENCY ANEMIA (ICD-281.1)    Route: IM    Site: L deltoid    Exp Date: 12/2011    Lot #: [id number]    Mfr: American Regent    Patient tolerated injection without complications    Given by: Theresia Lo RN (June 10, 2010 1:33 PM)  Orders Added: 1)  Vit B12 1000 mcg [J3420] 2)  Admin of Injection (IM/SQ) [54098]   Medication Administration  Injection # 3:    Medication: Vit B12 1000 mcg    Diagnosis: OTHER VITAMIN B12 DEFICIENCY ANEMIA (ICD-281.1)    Route: IM    Site: L deltoid    Exp Date: 12/2011    Lot #: [id number]    Mfr: American Regent    Patient tolerated injection without complications    Given by: Theresia Lo RN (June 10, 2010 1:33 PM)  Orders Added: 1)  Vit B12 1000 mcg [J3420] 2)  Admin of Injection (IM/SQ) [11914]

## 2010-12-17 NOTE — Letter (Signed)
Summary: LAB Letter  [facility]  [address]   [place], [place] [address]   Phone: [phone]  Fax: [phone]    05/30/2010  [name] [address] [place], [place]  [address]  Dear [name],   Labs looked OK. Good to see you!        Sincerely,   Denny Levy MD  Appended Document: LAB Letter mailed

## 2010-12-17 NOTE — Consult Note (Signed)
Summary: [facility]  [facility]   Imported By: Clydell Hakim 03/27/2010 13:31:23  _____________________________________________________________________  External Attachment:    Type:   Image     Comment:   External Document

## 2010-12-17 NOTE — Progress Notes (Signed)
Summary: refill   Phone Note Refill Request Call back at Home Phone [phone] Message from:  Patient  Refills Requested: Medication #1:  ALTACE 5 MG CAPS 1 by mouth once daily generic preferred   Supply Requested: 3 months pls mail to [address], [place] [address]  Initial call taken by: De Nurse,  May 29, 2010 2:14 PM    Prescriptions: ALTACE 5 MG CAPS (RAMIPRIL) 1 by mouth once daily generic preferred  #90 x 3   Entered and Authorized by:   Denny Levy MD   Signed by:   Denny Levy MD on 05/29/2010   Method used:   Print then Give to Patient   RxID:   [id number]

## 2010-12-17 NOTE — Assessment & Plan Note (Signed)
Summary: TROUBLE SWALLOWING/YF    History of Present Illness Visit Type: Follow-up Visit Primary GI MD: Sheryn Bison MD FACP FAGA Primary Provider: Denny Levy, MD Requesting Provider: na Chief Complaint: Dysphagia with solid  History of Present Illness:   77 year old Caucasian male with recurrent solid food dysphagia on chronic Protonix therapy for chronic GERD. Last endoscopic dilation was approximately year and a half ago. He now relates progressive solid food dysphagia in the mid substernal area without significant acid reflux symptoms. He denies lower gastrointestinal or hepatobiliary problems. He is on B12 replacement therapy and also antihypertensive medications but is stable from a cardiovascular standpoint without symptoms of angina or CHF.   GI Review of Systems    Reports dysphagia with solids.      Denies abdominal pain, acid reflux, belching, bloating, chest pain, dysphagia with liquids, heartburn, loss of appetite, nausea, vomiting, vomiting blood, weight loss, and  weight gain.        Denies anal fissure, black tarry stools, change in bowel habit, constipation, diarrhea, diverticulosis, fecal incontinence, heme positive stool, hemorrhoids, irritable bowel syndrome, jaundice, light color stool, liver problems, rectal bleeding, and  rectal pain.    Current Medications (verified): 1)  Exforge 10-160 Mg  Tabs (Amlodipine Besylate-Valsartan) .Marland Kitchen.. 1 By Mouth Once Daily 2)  Protonix 40 Mg Tbec (Pantoprazole Sodium) .... Take 1 Tablet By Mouth Once A Day 3)  Hydrochlorothiazide 25 Mg Tabs (Hydrochlorothiazide) .... Take One Tablet By Mouth Every Other Day 4)  Altace 5 Mg Caps (Ramipril) .Marland Kitchen.. 1 By Mouth Once Daily Generic Preferred 5)  Bayer Childrens Aspirin 81 Mg Chew (Aspirin) .... Take 1 Tablet By Mouth Once A Day 6)  Cyanocobalamin 1000 Mcg/ml Soln (Cyanocobalamin) .... Im Injection Once Monthly 7)  Bystolic 10 Mg Tabs (Nebivolol Hcl) .... One Tablet By Mouth Once  Daily  Allergies (verified): 1)  Codeine  Past History:  Past medical, surgical, family and social histories (including risk factors) reviewed for relevance to current acute and chronic problems.  Past Medical History: Reviewed history from 07/05/2008 and no changes required. 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 appendectomy tonsillectomy carotid dopplers-bilat plaque 0-49% - 11/17/2002,  Cataract surgery - 04/10/2006,  colonoscopy-IBS, severe diverticulosis, `floppy cecum` - 11/17/1996,  ECHO EF55-65% no wall abnmlty - 10/21/2005, EKG SB w/1st degree AVB - 05/02/2003,  Esophageal stricture dilation Dr. 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 Brother-colon ca 70s,  HTN,  F died 4 HTN,  M died 83 parkinsons,  S died 21 `blood infx`,   Social History: Reviewed history from 03/22/2008 and no changes required. Lives in [place] . Wife Thomasenia Sales) deceased 27-Apr-2007. retired.  2 cats, gardens. ; Prior 40pack-yr tob hx, quit 75yrs ago.  Prior social etoh, none since 78.  Independent ADLs.  Exercise bike daily.  ; Family in  area=stepson John in [place], brother in [place], stepdaughter in [place], stepdaughter in [place].   granddaughter has Hodgkins  lyumphoma 03/2008.  Review of Systems       The patient complains of arthritis/joint pain.  The patient denies allergy/sinus, anemia, anxiety-new, back pain, blood in urine, breast changes/lumps, change in vision, confusion, cough, coughing up blood, depression-new, fainting, fatigue, fever, headaches-new, hearing problems, heart murmur, heart rhythm changes, itching, menstrual pain, muscle pains/cramps, night sweats, nosebleeds, pregnancy symptoms, shortness of breath, skin rash, sleeping problems, sore throat, swelling of feet/legs, swollen lymph glands, thirst - excessive , urination - excessive , urination changes/pain, urine leakage, vision changes, and voice change.    Vital Signs:  Patient profile:   77 year old male Height:      66.75 inches Weight:      191 pounds BMI:     30.25 BSA:     1.98 Pulse rate:   88 / minute Pulse rhythm:   regular BP sitting:   128 / 76  (left arm) Cuff size:   regular  Vitals Entered By: Ok Anis CMA (May 16, 2010 10:01 AM)  Physical Exam  General:  Well developed, well nourished, no acute distress.healthy appearing.  Fascial deformity is fascial with previous Mohs surgery. Head:  Normocephalic and atraumatic. Eyes:  PERRLA, no icterus.exam deferred to patient's ophthalmologist.   Neck:  Supple; no masses or thyromegaly. Lungs:  Clear throughout to auscultation. Heart:  Regular rate and rhythm; no murmurs, rubs,  or bruits.irregular rhythm:.   Abdomen:  Soft, nontender and nondistended. No masses, hepatosplenomegaly or hernias noted. Normal bowel sounds. Extremities:  No clubbing, cyanosis, edema or deformities noted. Neurologic:  Alert and  oriented x4;  grossly normal neurologically. Psych:  Alert and cooperative. Normal mood and affect.   Impression & Recommendations:  Problem # 1:  DYSPHAGIA UNSPECIFIED (ICD-787.20) Assessment Deteriorated Long history of recurrent GERD and peptic strictures of the esophagus. We will schedule repeat  endoscopy and esophageal dilatation his convenience. He is to continue his reflux maneuvers and daily PPI therapy.  Problem # 2:  ADENOCARCINOMA, COLON, FAMILY HX (ICD-V16.0) Assessment: Unchanged He has noted lower GI problems at this time and is up-to-date on his colonoscopy exams.  Problem # 3:  HYPERTENSION, BENIGN SYSTEMIC (ICD-401.1) Assessment: Improved blood pressure 128/76 he is to continue his antihypertensives as per primary care.  Patient Instructions: 1)  Copy sent to : Dr. Philipp Deputy 2)  Please continue current medications.  3)  Conscious Sedation brochure given.  4)  Upper Endoscopy with Dilatation brochure given.  5)  Avoid foods high in acid content ( tomatoes, citrus juices, spicy foods) . Avoid eating within 3 to 4 hours of lying down or before exercising. Do not over eat; try smaller more frequent meals. Elevate head of bed four inches when sleeping.   Appended Document: Orders Update    Clinical Lists Changes  Orders: Added new Test order of EGD SAV (EGD SAV) - Signed

## 2010-12-17 NOTE — Letter (Signed)
Summary: Appt Reminder 2  [facility]  [address] [place], [place] [address]   Phone: [phone]  Fax: [phone]        May 02, 2010 MRN: [id number]    [facility] [address] [place]  [address]    Dear [name],   You have a return appointment with Dr. Jarold Motto on 05-16-10 at 10:30am.  Please remember to bring a complete list of the medicines you are taking, your insurance card and your co-pay.  If you have to cancel or reschedule this appointment, please call before 5:00 pm the evening before to avoid a cancellation fee.  If you have any questions or concerns, please call [phone].    Sincerely,  Dr Norval Gable office

## 2010-12-17 NOTE — Consult Note (Signed)
Summary: Abdominal Pain   NAME:  [name], [name]               ACCOUNT NO.:  [id number]   MEDICAL RECORD NO.:  [id number]          PATIENT TYPE:  INP   LOCATION:  3706                         FACILITY:  [facility]   PHYSICIAN:  Santiago Bumpers. Hensel, M.D.DATE OF BIRTH:  06-Aug-1920   DATE OF CONSULTATION:  10/03/2005  DATE OF DISCHARGE:                                   CONSULTATION   HISTORY OF PRESENT ILLNESS:  [name] is an 77 year old male patient  admitted yesterday after a syncopal episode at [facility].  He was seen by EMS  services and found to have bradycardia.  He has now been seen by cardiology,  and recommendations are outlined in the chart regarding permanent pacemaker.   From our standpoint, the patient has had a two-week history of intermittent  abdominal pain crampy.  This discomfort worsened yesterday morning, and  more specifically hurt over the lower aspect of the abdominal wall.  When he  came to the emergency room, a CT scan showed a mid-small bowel obstruction  as well as diverticulosis of the sigmoid colon.  Followup x-ray this morning  shows a bowel gas pattern consistent with small-bowel obstruction.  He does  have an NG tube with about 400 cc out since it has been placed.   REVIEW OF SYSTEMS:  No chest pain, no shortness of breath.  Otherwise,  negative.   ALLERGIES:  CODEINE CAUSES NAUSEA.   CURRENT MEDICATIONS:  1.  Baby aspirin.  2.  Lovenox.  3.  Avapro.  4.  Dilantin.  5.  Protonix.  6.  Altace.   SOCIAL HISTORY:  He lives in [place].  Married.  Former alcohol and  tobacco use.   FAMILY HISTORY:  Mom died at age 65 of Parkinson's.  Dad died at age 19 of  colon cancer and had hypertension.   PAST MEDICAL HISTORY:  1.  Hypertension.  2.  Glaucoma.  3.  Gout.  4.  Diabetes mellitus, diet-controlled.  5.  BPH.  6.  Arthritis.  7.  Cataracts.  8.  Conjunctivitis.  9.  Dysphagia.  10. GERD.   Of note, he did have a previous episode of syncope  in February of 2005 that  was diagnosed as gastroenteritis.   LABORATORY DATA:  Glucose 130.  __________ occult positive.  Otherwise, his  labs are normal.   PHYSICAL EXAMINATION:  VITAL SIGNS:  Temperature 97.1, pulse 71,  respirations 18, blood pressure 145/53, O2 saturation 96% on room air.  HEENT:  Grossly normal.  NECK:  No carotid or subclavian bruits.  No JVD or thyromegaly.  CHEST:  Clear to auscultation bilaterally, no wheeze or rhonchi.  HEART:  Regular rate and rhythm.  1/6 systolic murmur.  ABDOMEN:  Soft but somewhat distended.  More tender over the lower abdominal  wall.  He does have right appendectomy scar.  EXTREMITIES:  No peripheral edema.  SKIN:  Warm and dry.  NEUROLOGIC:  Intact.   ASSESSMENT:  1.  Small-bowel obstruction.  2.  Hypertension.  3.  Glaucoma.  4.  Gout.  5.  Diabetes mellitus,  diet-controlled.  6.  Benign prostatic hypertrophy.  7.  Arthritis.  8.  Cataracts.  9.  Conjunctivitis.  10. Dysphagia.  11. Gastroesophageal reflux disease.   PLAN:  Continue the patient with IV fluids and keep him n.p.o.  He admits to  some flatus and bowel movements during the day today and I expect this has  resolved to a partial small-bowel obstruction that will hopefully resolve on  its own without surgery.  I will recheck the abdominal films in the morning.  The patient will be seen by Dr. Luisa Hart.      Guy Franco, P.A.    ______________________________  Santiago Bumpers Leveda Anna, M.D.    LB/MEDQ  D:  10/03/2005  T:  10/05/2005  Job:  [id number]

## 2010-12-17 NOTE — Miscellaneous (Signed)
Summary: EYE EXAM   Clinical Lists Changes  Observations: Added new observation of DIAB EYE EX: 02/20/2010 no retinopathy (02/26/2010 11:59) Added new observation of DM PROGRESS: N/A (02/26/2010 11:59) Added new observation of DM FSREVIEW: N/A (02/26/2010 11:59) Added new observation of LIPID PROGRS: N/A (02/26/2010 11:59) Added new observation of LIPID FSREVW: N/A (02/26/2010 11:59)      Complete Medication List: 1)  Exforge 10-160 Mg Tabs (Amlodipine besylate-valsartan) .Marland Kitchen.. 1 by mouth once daily 2)  Protonix 40 Mg Tbec (Pantoprazole sodium) .... Take 1 tablet by mouth once a day 3)  Hydrochlorothiazide 25 Mg Tabs (Hydrochlorothiazide) .... Take one tablet by mouth every other day 4)  Altace 5 Mg Caps (Ramipril) .Marland Kitchen.. 1 by mouth once daily generic preferred 5)  Bayer Childrens Aspirin 81 Mg Chew (Aspirin) .... Take 1 tablet by mouth once a day 6)  Cyanocobalamin 1000 Mcg/ml Soln (Cyanocobalamin) .... Im injection once monthly 7)  Florastor 250 Mg Caps (Saccharomyces boulardii) .... Take one by mouth once daily 8)  Calmoseptine 0.44-20.625 % Oint (Menthol-zinc oxide) .... Apply to buttocks 9)  Triamcinolone Acetonide 0.1 % Crea (Triamcinolone acetonide) .... Dispense one 60 g tube apply once daily or two times a day to affected areas on  arms 10)  Fluticasone Propionate 50 Mcg/act Susp (Fluticasone propionate) .Marland Kitchen.. 1-2 sprays each nostril once daily disp 3 months    Prevention & Chronic Care Immunizations   Influenza vaccine: Fluvax MCR  (08/17/2009)   Influenza vaccine due: 07/18/2010    Tetanus booster: 11/17/2001: Done.   Tetanus booster due: 11/18/2011    Pneumococcal vaccine: Done.  (11/18/1995)   Pneumococcal vaccine deferral: Not indicated  (07/05/2009)   Pneumococcal vaccine due: None    H. zoster vaccine: Not documented  Colorectal Screening   Hemoccult: not indicated had colonoscopy  (11/14/2008)   Hemoccult due: Not Indicated    Colonoscopy: normal   (10/28/2006)   Colonoscopy due: 10/28/2016  Other Screening   PSA: done at urology office  (02/02/2008)   PSA action/deferral: Not indicated  (07/05/2009)   PSA due due: 02/01/2009   Smoking status: quit  (10/10/2009)  Lipids   Total Cholesterol: 152  (07/05/2008)   LDL: 61  (07/05/2008)   LDL Direct: 91  (12/26/2009)   HDL: 28  (07/05/2008)   Triglycerides: 315  (07/05/2008)   Lipid panel due: 09/04/2009  Hypertension   Last Blood Pressure: 144 / 72  (12/26/2009)   Serum creatinine: 1.34  (12/26/2009)   BMP action: Ordered   Serum potassium 5.0  (12/26/2009)  Self-Management Support :   Personal Goals (by the next clinic visit) :      Personal blood pressure goal: 165/95  (10/10/2009)   Hypertension self-management support: Written self-care plan  (07/30/2009)    Hypertension self-management support not done because: Good outcomes  (10/10/2009)

## 2010-12-17 NOTE — Assessment & Plan Note (Signed)
Summary: shingles vaccine/ls   Nurse Visit   Vital Signs:  Patient profile:   77 year old male Temp:     98.3 degrees F  Vitals Entered By: Theresia Lo RN (September 18, 2010 11:07 AM)  Allergies: 1)  Codeine  Immunizations Administered:  Zostavax # 1:    Vaccine Type: Zostavax    Site: right arm    Mfr: Merck    Dose: 0.65 ml    Route: Nellie    Given by: Theresia Lo RN    Exp. Date: 12/05/2010    Lot #: [id number]    VIS given: 08/29/05 given September 18, 2010.  Orders Added: 1)  Zoster (Shingles) Vaccine Live [90736] 2)  Admin 1st Vaccine [90471]     Vital Signs:  Patient profile:   77 year old male Temp:     98.3 degrees F  Vitals Entered By: Theresia Lo RN (September 18, 2010 11:07 AM)

## 2010-12-17 NOTE — Consult Note (Signed)
Summary: [facility]  [facility]   Imported By: Bradly Bienenstock 02/20/2010 11:23:12  _____________________________________________________________________  External Attachment:    Type:   Image     Comment:   External Document

## 2010-12-17 NOTE — Miscellaneous (Signed)
Summary: Advance Beneficiary Notice  Advance Beneficiary Notice   Imported By: Bradly Bienenstock 09/24/2010 16:37:41  _____________________________________________________________________  External Attachment:    Type:   Image     Comment:   External Document

## 2010-12-17 NOTE — Letter (Signed)
Summary: EGD Instructions  [facility]  [address] [place], [place] [address]   Phone: [phone]  Fax: [phone]       [name]    08-Nov-1920    MRN: [id number]       Procedure Day /Date: 05/22/10 Wednesday     Arrival Time: 7:30 am     Procedure Time: 8:30 am     Location of Procedure:                    _ x _ [facility] (4th Floor)  PREPARATION FOR ENDOSCOPY   On 05/22/10 THE DAY OF THE PROCEDURE:  1.   No solid foods, milk or milk products are allowed after midnight the night before your procedure.  2.   Do not drink anything colored red or purple.  Avoid juices with pulp.  No orange juice.  3.  You may drink clear liquids until 6:30 am, which is 2 hours before your procedure.                                                                                                CLEAR LIQUIDS INCLUDE: Water Jello Ice Popsicles Tea (sugar ok, no milk/cream) Powdered fruit flavored drinks Coffee (sugar ok, no milk/cream) Gatorade Juice: apple, white grape, white cranberry  Lemonade Clear bullion, consomm, broth Carbonated beverages (any kind) Strained chicken noodle soup Hard Candy   MEDICATION INSTRUCTIONS  Unless otherwise instructed, you should take regular prescription medications with a small sip of water as early as possible the morning of your procedure.                 OTHER INSTRUCTIONS  You will need a responsible adult at least 75 years of age to accompany you and drive you home.   This person must remain in the waiting room during your procedure.  Wear loose fitting clothing that is easily removed.  Leave jewelry and other valuables at home.  However, you may wish to bring a book to read or an iPod/MP3 player to listen to music as you wait for your procedure to start.  Remove all body piercing jewelry and leave at home.  Total time from sign-in until discharge is approximately 2-3 hours.  You should go  home directly after your procedure and rest.  You can resume normal activities the day after your procedure.  The day of your procedure you should not:   Drive   Make legal decisions   Operate machinery   Drink alcohol   Return to work  You will receive specific instructions about eating, activities and medications before you leave.    The above instructions have been reviewed and explained to me by  Lamona Curl CMA Duncan Dull)  May 16, 2010 10:48 AM     I fully understand and can verbalize these instructions _____________________________ Date 05/16/10

## 2010-12-17 NOTE — Assessment & Plan Note (Signed)
Summary: b-12,df   Nurse Visit   Allergies: 1)  Codeine  Medication Administration  Injection # 1:    Medication: Vit B12 1000 mcg    Diagnosis: OTHER VITAMIN B12 DEFICIENCY ANEMIA (ICD-281.1)    Route: IM    Site: L deltoid    Exp Date: 12/2011    Lot #: [id number]    Mfr: American Regent    Patient tolerated injection without complications    Given by: Theresia Lo RN (Mar 29, 2010 4:09 PM)  Orders Added: 1)  Vit B12 1000 mcg [J3420] 2)  Admin of Injection (IM/SQ) [45409]   Medication Administration  Injection # 1:    Medication: Vit B12 1000 mcg    Diagnosis: OTHER VITAMIN B12 DEFICIENCY ANEMIA (ICD-281.1)    Route: IM    Site: L deltoid    Exp Date: 12/2011    Lot #: [id number]    Mfr: [employer] Regent    Patient tolerated injection without complications    Given by: Theresia Lo RN (Mar 29, 2010 4:09 PM)  Orders Added: 1)  Vit B12 1000 mcg [J3420] 2)  Admin of Injection (IM/SQ) [81191]

## 2010-12-17 NOTE — Op Note (Signed)
Summary: Small Bowel Obstruction   NAME:  [name], [name]               ACCOUNT NO.:  [id number]   MEDICAL RECORD NO.:  [id number]          PATIENT TYPE:  INP   LOCATION:  3706                         FACILITY:  [facility]   PHYSICIAN:  Sandria Bales. Ezzard Standing, M.D.  DATE OF BIRTH:  08-28-1920   DATE OF PROCEDURE:  10/08/2005  DATE OF DISCHARGE:                                 OPERATIVE REPORT   PREOPERATIVE DIAGNOSIS:  Small bowel obstruction.   POSTOPERATIVE DIAGNOSIS:  Small bowel obstruction secondary to adhesions of  the right lower quadrant from a prior appendectomy.   PROCEDURE:  Laparoscopic enterolysis of adhesions.   SURGEON:  Sandria Bales. Ezzard Standing, M.D.   FIRST ASSISTANT:  Anselm Pancoast. Zachery Dakins, M.D.   ANESTHESIA:  General endotracheal anesthesia.   ESTIMATED BLOOD LOSS:  Minimal.   INDICATIONS FOR PROCEDURE:  [name] is an 77 year old white male with a  history of syncope and cardiac issues admitted to the hospital on October 02, 2005.  Unfortunately, he has developed a small bowel obstruction.  At  one point, it was thought this may be getting better, but now his  obstruction has gotten worse, and he now comes for laparoscopic and possible  open exploration for enterolysis of adhesions.   The indications and potential complications of the procedure were explained  to the patient.  The potential complications include but are not limited to  bleeding, infection, the possibility of bowel resection, also the  possibility there is something other than adhesions causing his bowel  obstruction.   DESCRIPTION OF PROCEDURE:  The patient comes to the operating room for  general anesthesia.  The patient was placed in a supine position, his left  arm tucked, right arm to his side, had a Foley catheter placed.  His abdomen  was prepped with Betadine solution.  He was given 1 gram Cefoxitin at the  initiation of the procedure.   I was going to start out laparoscopically just to see what I  could see and I  placed 10 mm laparoscope through a 12 mm Hasson trocar in the infraumbilical  incision.  I placed a 5 mm trocar in the right subcostal, 5 mm trocar in the  left subcostal, and a 5 mm trocar in the left mid abdomen.  Using a 0 degree  10mm laparoscope, I first did an exploration.  The right and left lobes of  the liver were unremarkable.  The anterior wall of the stomach was  decompressed and unremarkable.  The colon was actually distended but so was  much of the small bowel.  I was able to identify the terminal ileum and  cecum.   He had at least two band areas, one where the small bowel was adhesed along  the pelvic brim and a second band that came back on itself medial to the  right colon.  I was able to do enterolysis, doing this laparoscopically.  Again, I identified the ileocecal valve and the antimesenteric folds of fat  at the terminal ileum.  I was able to take the adhesions down.  I used  almost exclusively scissors and used no cautery or Harmonic scalpel because  of was worried about bowel damage or heat damage from these instruments.   At the end of the procedure, I ran the bowel from the terminal cecum to the  terminal ileum all the way back to the proximal jejunum until I got to just  totally free bowel that was markedly distended.  At this point, I irrigated  the abdomen out with a liter of saline.  I thought I did an adequate  enterolysis of adhesions and found, again, at least two points which I  thought were high grade obstructions which were causing his problems which  had been taken care of.  I saw no evidence of any malignancy or any other  cause for the bowel obstruction.   At the end of the procedure, the NG tube was replaced and I was able to  visualize the NG tube through the wall of the stomach and this was  positioned in the mid stomach.  The patient tolerated the procedure well.  The trocars were then removed, there was no bleeding at any  trocar site.  The umbilical port was closed with a 0 Vicryl suture.  The skin at each site  was closed with a 5-0 Vicryl suture, painted with tincture of Benzoin, and  covered with Steri-Strips.  The patient tolerated the procedure well and was  transported to the recovery room in good condition.  Sponge and needle count  were correct at the end of the case.      Sandria Bales. Ezzard Standing, M.D.  Electronically Signed     DHN/MEDQ  D:  10/08/2005  T:  10/08/2005  Job:  [id number]   cc:   William A. Leveda Anna, M.D.  Fax: [phone]   Richard A. Alanda Amass, M.D.  Fax: [phone]

## 2010-12-17 NOTE — Miscellaneous (Signed)
Summary: MYOVIEW   Clinical Lists Changes  Observations: Added new observation of ECHOINTERP:  Myoview EF 81% normal wall motion, normal perfusion all regions Dr Lynnea Ferrier (03/04/2010 15:58)      Echocardiogram  Procedure date:  03/04/2010  Findings:       Myoview EF 81% normal wall motion, normal perfusion all regions Dr Lynnea Ferrier   Echocardiogram  Procedure date:  03/04/2010  Findings:       Myoview EF 81% normal wall motion, normal perfusion all regions Dr Lynnea Ferrier

## 2010-12-17 NOTE — Assessment & Plan Note (Signed)
Summary: f/u,df   Vital Signs:  Patient profile:   77 year old male Weight:      188.5 pounds Temp:     97.6 degrees F oral Pulse rate:   81 / minute Pulse rhythm:   regular BP sitting:   144 / 72  (right arm) Cuff size:   regular  Vitals Entered By: Loralee Pacas CMA (December 26, 2009 11:25 AM) CC: follow-up visit Comments congestion, sharp pains in left arm 1-2 x q day x 3 days, B-12 inj  B-12 inj given.Loralee Pacas CMA  December 26, 2009 2:51 PM   Primary Care Provider:  Denny Levy, MD  CC:  follow-up visit.  History of Present Illness: Follow up hypertension. Taking medicines regularly with no problems. Not having any any headaches or chest pains. Bp ranges usually 145-155 / 88 ish. pulse is in mid80s although occasionally up to 92. he says no more dizziness. He thinks his heart does 'beat fast" some times, but does not notice any assoc signs particularly atthese times. Needs some refills.  Needs b 12 shot.  Has had a bit of a chest cold and using otc cough syrup which has seemed to help.  Once or twice he has had some sharp fleeting shoulder pains on left.  Swallowing is going OK.   Current Medications (verified): 1)  Exforge 10-160 Mg  Tabs (Amlodipine Besylate-Valsartan) .Marland Kitchen.. 1 By Mouth Once Daily 2)  Protonix 40 Mg Tbec (Pantoprazole Sodium) .... Take 1 Tablet By Mouth Once A Day 3)  Hydrochlorothiazide 25 Mg Tabs (Hydrochlorothiazide) .... Take One Tablet By Mouth Every Other Day 4)  Altace 5 Mg Caps (Ramipril) .Marland Kitchen.. 1 By Mouth Once Daily Generic Preferred 5)  Bayer Childrens Aspirin 81 Mg Chew (Aspirin) .... Take 1 Tablet By Mouth Once A Day 6)  Cyanocobalamin 1000 Mcg/ml Soln (Cyanocobalamin) .... Im Injection Once Monthly 7)  Florastor 250 Mg Caps (Saccharomyces Boulardii) .... Take One By Mouth Once Daily 8)  Calmoseptine 0.44-20.625 % Oint (Menthol-Zinc Oxide) .... Apply To Buttocks 9)  Triamcinolone Acetonide 0.1 % Crea (Triamcinolone Acetonide) ....  Dispense One 60 G Tube Apply Once Daily or Two Times A Day To Affected Areas On  Arms 10)  Fluticasone Propionate 50 Mcg/act Susp (Fluticasone Propionate) .Marland Kitchen.. 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 Wife Thomasenia Sales) deceased 2007-03-31. retired.  2 cats, gardens. ; Prior 40pack-yr tob hx, quit 81yrs ago.  Prior  social etoh, none since 1990.  Independent ADLs.  Exercise bike daily.  ; Family in area=stepson John in [place], brother in [place], stepdaughter in [place], stepdaughter in [place].   granddaughter has Hodgkins lyumphoma 03/2008.  Review of Systems       The patient complains of decreased hearing.  The patient denies anorexia, fever, weight loss, weight gain, chest pain, syncope, prolonged cough, headaches, abdominal pain, severe  indigestion/heartburn, difficulty walking, and depression.         very slight ankle swelling noticed in evenings, goes away by am no change in dyspnea on exertion some cough but getting better w cough syruo  Physical Exam  General:  alert.   Neck:  supple, full ROM, no masses, no thyromegaly, no JVD, and no carotid bruits.   Lungs:  normal respiratory effort, normal breath sounds, and no wheezes.   Heart:  normal rate, regular rhythm, and no murmur.   Abdomen:  soft and normal bowel sounds.   Msk:  normal ROM.  rises from a chair independently. Is using cane when walking but pretty steady gait. Neurologic:  alert & oriented X3.   Psych:  Oriented X3, memory intact for recent and remote, normally interactive, good eye contact, not anxious appearing, and not depressed appearing.     Impression & Recommendations:  Problem # 1:  HYPERTENSION, BENIGN SYSTEMIC (ICD-401.1) Assessment Unchanged  His updated medication list for this problem includes:    Exforge 10-160 Mg Tabs (Amlodipine besylate-valsartan) .Marland Kitchen... 1 by mouth once daily    Hydrochlorothiazide 25 Mg Tabs (Hydrochlorothiazide) .Marland Kitchen... Take one tablet by mouth every other day    Altace 5 Mg Caps (Ramipril) .Marland Kitchen... 1 by mouth once daily generic preferred  Orders: Basic Met-[facility] (81191-47829) Direct LDL-[facility] (56213-08657) [facility]- Est  Level 4 (84696) doing well on this regimen.  Problem # 2:  DYSPHAGIA UNSPECIFIED (ICD-787.20) no current issues after last esophageal dilation  Problem # 3:  OTHER VITAMIN B12 DEFICIENCY ANEMIA (ICD-281.1)  His updated medication list for this problem includes:    Cyanocobalamin 1000 Mcg/ml Soln (Cyanocobalamin) ..... Im injection once monthly  Orders: Vit B12 1000 mcg (J3420) [facility]- Est  Level 4 (99214) energy level seems good  Complete Medication List: 1)  Exforge 10-160 Mg Tabs (Amlodipine besylate-valsartan) .Marland Kitchen.. 1 by mouth once daily 2)  Protonix 40 Mg Tbec (Pantoprazole sodium) .... Take 1  tablet by mouth once a day 3)  Hydrochlorothiazide 25 Mg Tabs (Hydrochlorothiazide) .... Take one tablet by mouth every other day 4)  Altace 5 Mg Caps (Ramipril) .Marland Kitchen.. 1 by mouth once daily generic preferred 5)  Bayer Childrens Aspirin 81 Mg Chew (Aspirin) .... Take 1 tablet by mouth once a day 6)  Cyanocobalamin 1000 Mcg/ml Soln (Cyanocobalamin) .... Im injection once monthly 7)  Florastor 250 Mg Caps (Saccharomyces boulardii) .... Take one by mouth once daily 8)  Calmoseptine 0.44-20.625 % Oint (Menthol-zinc oxide) .... Apply to buttocks 9)  Triamcinolone Acetonide 0.1 % Crea (Triamcinolone acetonide) .... Dispense one 60 g tube apply once daily or two times a day to affected areas on  arms 10)  Fluticasone Propionate 50 Mcg/act Susp (Fluticasone propionate) .Marland Kitchen.. 1-2 sprays each nostril once daily disp 3 months Prescriptions: PROTONIX 40 MG TBEC (PANTOPRAZOLE SODIUM) Take 1 tablet by mouth once a day  #90 x 3   Entered and Authorized by:   Denny Levy MD   Signed by:   Denny Levy MD on 12/26/2009   Method used:   Print  then Give to Patient   RxID:   [id number] EXFORGE 10-160 MG  TABS (AMLODIPINE BESYLATE-VALSARTAN) 1 by mouth once daily  #90 x 3   Entered and Authorized by:   Denny Levy MD   Signed by:   Denny Levy MD on 12/26/2009   Method used:   Print then Give to Patient   RxID:   [id number]   Prevention & Chronic Care Immunizations   Influenza vaccine: Fluvax MCR  (08/17/2009)   Influenza vaccine due: 07/18/2010    Tetanus booster: 11/17/2001: Done.   Tetanus booster due: 11/18/2011    Pneumococcal vaccine: Done.  (11/18/1995)   Pneumococcal vaccine deferral: Not indicated  (07/05/2009)   Pneumococcal vaccine due: None    H. zoster vaccine: Not documented  Colorectal Screening   Hemoccult: not indicated had colonoscopy  (11/14/2008)   Hemoccult due: Not Indicated    Colonoscopy: normal  (10/28/2006)   Colonoscopy due: 10/28/2016  Other Screening   PSA: done at  urology office  (02/02/2008)   PSA action/deferral: Not indicated  (07/05/2009)   PSA due due: 02/01/2009   Smoking status: quit  (10/10/2009)  Lipids   Total Cholesterol: 152  (07/05/2008)   LDL: 61  (07/05/2008)   LDL Direct: Not documented   HDL: 28  (07/05/2008)   Triglycerides: 315  (07/05/2008)   Lipid panel due: 09/04/2009  Hypertension   Last Blood Pressure: 144 / 72  (12/26/2009)   Serum creatinine: 1.29  (07/30/2009)   BMP action: Ordered   Serum potassium 5.0  (07/30/2009)    Hypertension flowsheet reviewed?: Yes   Progress toward BP goal: At goal  Self-Management Support :   Personal Goals (by the next clinic visit) :      Personal blood pressure goal: 165/95  (10/10/2009)   Hypertension self-management support: Written self-care plan  (07/30/2009)    Hypertension self-management support not done because: Good outcomes  (10/10/2009)   Medication Administration  Injection # 1:    Medication: Vit B12 1000 mcg    Diagnosis: OTHER VITAMIN B12 DEFICIENCY ANEMIA (ICD-281.1)    Route: IM    Site: R deltoid    Exp Date: 08/18/2011    Lot #: [id number]    Mfr: American Regent    Patient tolerated injection without complications    Given by: Loralee Pacas CMA (December 26, 2009 2:50 PM)  Orders Added: 1)  Basic Met-[facility] [phone] 2)  Direct LDL-[facility] [phone] 3)  Vit B12 1000 mcg [J3420] 4)  [facility]- Est  Level 4 [56387]

## 2010-12-17 NOTE — Miscellaneous (Signed)
   Clinical Lists Changes  Problems: Changed problem from Status post  HIP REPLACEMENT RIGHT (CPT-27125) to Status post  ARTHRP ACETBLR/PROX FEM PROSTC AGRFT/ALGRFT (CPT-27130) - right hip replacement

## 2010-12-17 NOTE — Procedures (Signed)
Summary: Upper Endoscopy  Patient: [name] Note: All result statuses are Final unless otherwise noted.  Tests: (1) Upper Endoscopy (EGD)   EGD Upper Endoscopy       DONE     [facility]     [address]     [place], [place]  [address]           ENDOSCOPY PROCEDURE REPORT           PATIENT:  [name], [name]  MR#:  [id number]     BIRTHDATE:  11/08/1920, 89 yrs. old  GENDER:  male           ENDOSCOPIST:  Vania Rea. Jarold Motto, MD, [facility]     Referred by:           PROCEDURE DATE:  05/22/2010     PROCEDURE:  EGD with biopsy, Elease Hashimoto Dilation of Esophagus     ASA CLASS:  Class II     INDICATIONS:  GERD, dysphagia           MEDICATIONS:   Fentanyl 25 mcg IV, Versed 1 mg IV     TOPICAL ANESTHETIC:           DESCRIPTION OF PROCEDURE:   After the risks benefits and     alternatives of the procedure were thoroughly explained, informed     consent was obtained.  The LB GIF-H180 [id number] endoscope was     introduced through the mouth and advanced to the second portion of     the duodenum, without limitations.  The instrument was slowly     withdrawn as the mucosa was fully examined.     <<PROCEDUREIMAGES>>           Moderate gastritis was found in the body and the antrum of the     stomach. erosions and dried heme.clo bx. done.  Normal duodenal     folds were noted.  Normal GE junction was noted. dilated #41f     maloney dilator.no heme or pain.  The esophagus and     gastroesophageal junction were completely normal in appearance. no     inflammation,food,stricture, or large HH.    Retroflexed views     revealed no abnormalities.    The scope was then withdrawn from     the patient and the procedure completed.           COMPLICATIONS:  None           ENDOSCOPIC IMPRESSION:     1) Moderate gastritis in the body and the antrum of the stomach           2) Normal duodenal folds     3) Normal GE junction     4) Normal esophagus     1.R/O H.PYLORI VS. NSAID DAMAGE  2.CHRONIC GERD AND PROBABLE OCCULT STRICTURE DILATED.     RECOMMENDATIONS:     1) Rx CLO if positive     2) continue PPI     3) post dilation instructions           REPEAT EXAM:  No           ______________________________     Vania Rea. Jarold Motto, MD, [facility] Graham           CC:  Denny Levy, MD           n.     Rosalie Doctor:   Vania Rea. Patterson at 05/22/2010 08:52 AM  Lawyer, [place], [phone]  Note: An exclamation mark (!) indicates a result that was not dispersed into the flowsheet. Document Creation Date: 05/22/2010 8:54 AM _______________________________________________________________________  (1) Order result status: Final Collection or observation date-time: 05/22/2010 08:45 Requested date-time:  Receipt date-time:  Reported date-time:  Referring Physician:   Ordering Physician: Sheryn Bison [phone]) Specimen Source:  Source: Launa Grill Order Number: [id number] Lab site:

## 2010-12-17 NOTE — Assessment & Plan Note (Signed)
Summary: b-12,df   Nurse Visit   Allergies: 1)  Codeine  Medication Administration  Injection # 1:    Medication: Vit B12 1000 mcg    Diagnosis: OTHER VITAMIN B12 DEFICIENCY ANEMIA (ICD-281.1)    Route: IM    Site: R deltoid    Exp Date: 10/2011    Lot #: [id number]    Mfr: American Regent    Patient tolerated injection without complications    Given by: Theresia Lo RN (February 25, 2010 12:01 PM)  Orders Added: 1)  Vit B12 1000 mcg [J3420] 2)  Admin of Injection (IM/SQ) [96045]   Medication Administration  Injection # 1:    Medication: Vit B12 1000 mcg    Diagnosis: OTHER VITAMIN B12 DEFICIENCY ANEMIA (ICD-281.1)    Route: IM    Site: R deltoid    Exp Date: 10/2011    Lot #: [id number]    Mfr: [employer]ent    Patient tolerated injection without complications    Given by: Theresia Lo RN (February 25, 2010 12:01 PM)  Orders Added: 1)  Vit B12 1000 mcg [J3420] 2)  Admin of Injection (IM/SQ) [11914]

## 2010-12-17 NOTE — Assessment & Plan Note (Signed)
Summary: b12,df   Nurse Visit   Allergies: 1)  Codeine  Medication Administration  Injection # 1:    Medication: Vit B12 1000 mcg    Diagnosis: OTHER VITAMIN B12 DEFICIENCY ANEMIA (ICD-281.1)    Route: IM    Site: L deltoid    Exp Date: 06/2012    Lot #: [id number]    Mfr: [employer]    Patient tolerated injection without complications    Given by: Theresia Lo RN (October 16, 2010 11:37 AM)  Orders Added: 1)  Vit B12 1000 mcg [J3420] 2)  Admin of Injection (IM/SQ) [81191]   Medication Administration  Injection # 1:    Medication: Vit B12 1000 mcg    Diagnosis: OTHER VITAMIN B12 DEFICIENCY ANEMIA (ICD-281.1)    Route: IM    Site: L deltoid    Exp Date: 06/2012    Lot #: [id number]    Mfr: [employer]    Patient tolerated injection without complications    Given by: Theresia Lo RN (October 16, 2010 11:37 AM)  Orders Added: 1)  Vit B12 1000 mcg [J3420] 2)  Admin of Injection (IM/SQ) [21308]

## 2010-12-17 NOTE — Assessment & Plan Note (Signed)
Summary: feet swelling/ls   Vital Signs:  Patient profile:   77 year old male Height:      66.75 inches Weight:      190.5 pounds BMI:     30.17 Temp:     97.8 degrees F oral Pulse rate:   85 / minute BP sitting:   130 / 68  (left arm) Cuff size:   regular  Vitals Entered By: Gladstone Pih (May 29, 2010 11:05 AM) CC: C/O swelling in feet and cold, dry cough Is Patient Diabetic? Yes Did you bring your meter with you today? No Pain Assessment Patient in pain? no        Primary Care Provider:  Denny Levy, MD  CC:  C/O swelling in feet and cold and dry cough.  History of Present Illness: 1) CV: his heart doctor added new med (bystolic). doing ok altho if he gets up from a supine position he sometimes has a few minutes of weakness or dizziness.  2) Feet are swelling a little more than usual--esp the right one. Goes down overnight but is painful when swollen. No increase in baseline DOE. No episodes of SOB during night.  3) had his Esophagus stretched again and is feeling some better from tat standpoint. Still a sens that a lot of mucus is in his throat and his saliva seems thicker.  Habits & Providers  Alcohol-Tobacco-Diet     Tobacco Status: quit     Year Quit: 1971  Current Medications (verified): 1)  Exforge 10-160 Mg  Tabs (Amlodipine Besylate-Valsartan) .Marland Kitchen.. 1 By Mouth Once Daily 2)  Protonix 40 Mg Tbec (Pantoprazole Sodium) .... Take 1 Tablet By Mouth Once A Day 3)  Hydrochlorothiazide 25 Mg Tabs (Hydrochlorothiazide) .... Take One Tablet By Mouth Every Other Day 4)  Altace 5 Mg Caps (Ramipril) .Marland Kitchen.. 1 By Mouth Once Daily Generic Preferred 5)  Bayer Childrens Aspirin 81 Mg Chew (Aspirin) .... Take 1 Tablet By Mouth Once A Day 6)  Cyanocobalamin 1000 Mcg/ml Soln (Cyanocobalamin) .... Im Injection Once Monthly 7)  Bystolic 10 Mg Tabs (Nebivolol Hcl) .... 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         Please see HPI for additional ROS.\par   Physical Exam  General:  alert, well-developed, well-nourished, and well-hydrated.   Neck:  supple, full ROM,  no masses, and no thyromegaly.   Lungs:  normal respiratory effort, normal breath sounds, no crackles, and no wheezes.   Heart:  normal rate, regular rhythm, and no murmur.   Abdomen:  soft, non-tender, and normal bowel sounds.   Extremities:  slight trace  edema left and 1= PITTING ON RIGHT le TO MID SHIN . SENSATION b FEET IS A LITTLE DIMINISHED BUT SYMMETRICAL  PULSES 1+ b dP. CALVES b ARE SOFT   Impression & Recommendations:  Problem # 1:  EDEMA (ICD-782.3)  MILD VEOUS STASIS, A LITLE ASYMMETRICAL WILL RECOMMEND MEDIUM SUPPORT HOSE (INFO  GIVEN0  Orders: [facility]- Est  Level 4 (19147)  Problem # 2:  DYSPHAGIA UNSPECIFIED (ICD-787.20)  VERY MUCH IMPROVED AFTER DILATION. STILL SENSE OF MUCUS IN THROAT--i THINK NOT CONCERNING AND WILL FOLLOW  Orders: [facility]- Est  Level 4 (82956)  Problem # 3:  HYPERTENSION, BENIGN SYSTEMIC (ICD-401.1)  His updated medication list for this problem includes:    Exforge 10-160 Mg Tabs (Amlodipine besylate-valsartan) .Marland Kitchen... 1 by mouth once daily    Hydrochlorothiazide 25 Mg Tabs (Hydrochlorothiazide) .Marland Kitchen... Take one tablet by mouth every other day    Altace 5 Mg Caps (Ramipril) .Marland Kitchen... 1 by mouth once daily generic preferred    Bystolic 10 Mg Tabs (Nebivolol hcl) ..... One tablet by mouth once daily  Orders: Comp Met-[facility] (21308-65784) [facility]- Est  Level 4 (99214) cARDS ADDED BYSTOLIC--HE IS HAVING SOME MILD ORTHOSTASIS BUT TOLRATING IT PRETTY WELL. CHECK LABS REFILLED HIS ALTACE  Complete Medication List: 1)  Exforge 10-160 Mg Tabs (Amlodipine besylate-valsartan) .Marland Kitchen.. 1 by mouth once daily 2)  Protonix 40 Mg Tbec (Pantoprazole sodium) .... Take 1 tablet by mouth once a day 3)  Hydrochlorothiazide 25 Mg Tabs (Hydrochlorothiazide) .... Take one tablet by mouth every other day 4)  Altace 5 Mg Caps (Ramipril) .Marland Kitchen.. 1 by mouth once daily generic preferred 5)  Bayer Childrens Aspirin 81 Mg Chew (Aspirin) .... Take 1 tablet by mouth once a day 6)  Cyanocobalamin 1000 Mcg/ml Soln (Cyanocobalamin) .... Im injection once monthly 7)  Bystolic 10 Mg Tabs (Nebivolol hcl) .... One tablet by mouth once daily 8)  Vitamin B 12 Shot Q M   Prevention & Chronic Care Immunizations   Influenza vaccine: Fluvax MCR  (08/17/2009)   Influenza vaccine due: 07/18/2010    Tetanus booster: 11/17/2001: Done.   Tetanus booster due: 11/18/2011    Pneumococcal vaccine: Done.  (11/18/1995)   Pneumococcal vaccine deferral: Not indicated  (07/05/2009)   Pneumococcal vaccine due: None    H. zoster vaccine: Not  documented  Colorectal Screening   Hemoccult: not indicated had colonoscopy  (11/14/2008)   Hemoccult due: Not Indicated    Colonoscopy: normal  (10/28/2006)   Colonoscopy due: 10/28/2016  Other Screening   PSA: done at urology office  (02/02/2008)   PSA action/deferral: Not indicated  (07/05/2009)   PSA due due: 02/01/2009   Smoking status: quit  (05/29/2010)  Lipids   Total Cholesterol: 152  (07/05/2008)   Lipid panel action/deferral: Not indicated   LDL: 61  (07/05/2008)   LDL Direct: 91  (12/26/2009)   HDL: 28  (07/05/2008)   Triglycerides: 315  (07/05/2008)   Lipid panel due: 09/04/2009  Hypertension   Last Blood Pressure: 130 / 68  (05/29/2010)   Serum creatinine: 1.34  (12/26/2009)   BMP action: Ordered   Serum potassium 5.0  (12/26/2009) CMP ordered     Hypertension flowsheet reviewed?: Yes   Progress toward BP goal:  At goal  Self-Management Support :   Personal Goals (by the next clinic visit) :      Personal blood pressure goal: 165/95  (10/10/2009)   Hypertension self-management support: Written self-care plan  (07/30/2009)    Hypertension self-management support not done because: Good outcomes  (10/10/2009)

## 2010-12-17 NOTE — Assessment & Plan Note (Signed)
Summary: b12,df   Nurse Visit   Allergies: 1)  Codeine  Medication Administration  Injection # 1:    Medication: Vit B12 1000 mcg    Diagnosis: OTHER VITAMIN B12 DEFICIENCY ANEMIA (ICD-281.1)    Route: IM    Site: R deltoid    Exp Date: 12/2011    Lot #: [id number]    Mfr: [employer]    Given by: Theresia Lo RN (July 11, 2010 11:40 AM)  Orders Added: 1)  Vit B12 1000 mcg [J3420] 2)  Admin of Injection (IM/SQ) [60454]   Medication Administration  Injection # 1:    Medication: Vit B12 1000 mcg    Diagnosis: OTHER VITAMIN B12 DEFICIENCY ANEMIA (ICD-281.1)    Route: IM    Site: R deltoid    Exp Date: 12/2011    Lot #: [id number]    Mfr: [employer]    Given by: Theresia Lo RN (July 11, 2010 11:40 AM)  Orders Added: 1)  Vit B12 1000 mcg [J3420] 2)  Admin of Injection (IM/SQ) [09811]

## 2010-12-17 NOTE — Miscellaneous (Signed)
Summary: clo teast  Clinical Lists Changes  Problems: Added new problem of GASTRITIS, ACUTE W/O HEMORRHAGE (ICD-535.00) Orders: Added new Test order of TLB-H Pylori Screen Gastric Biopsy (83013-CLOTEST) - Signed

## 2010-12-17 NOTE — Letter (Signed)
Summary: LAB Letter  [facility]  [address]   [place], [place] [address]   Phone: [phone]  Fax: [phone]    12/27/2009  YASHAS CAMILLI [address] [place], [place]  [address]  Dear Mr. Fogel,  Your lab work looked great! Stable kidney function and electrolytes. LDL cholesterol at 91 and our goal for you is less than 100 so this is perfect. Keep up the good work!         Sincerely,   Denny Levy MD  Appended Document: LAB Letter mailed.

## 2010-12-17 NOTE — Assessment & Plan Note (Signed)
Summary: b 12,df   Nurse Visit   Allergies: 1)  Codeine  Medication Administration  Injection # 1:    Medication: Vit B12 1000 mcg    Diagnosis: OTHER VITAMIN B12 DEFICIENCY ANEMIA (ICD-281.1)    Route: IM    Site: R deltoid    Exp Date: 06/2012    Lot #: [id number]    Mfr: [employer]    Patient tolerated injection without complications    Given by: Theresia Lo RN (September 13, 2010 2:57 PM)  Orders Added: 1)  Vit B12 1000 mcg [J3420] 2)  Admin of Injection (IM/SQ) [45409]   Medication Administration  Injection # 1:    Medication: Vit B12 1000 mcg    Diagnosis: OTHER VITAMIN B12 DEFICIENCY ANEMIA (ICD-281.1)    Route: IM    Site: R deltoid    Exp Date: 06/2012    Lot #: [id number]    Mfr: [employer]    Patient tolerated injection without complications    Given by: Theresia Lo RN (September 13, 2010 2:57 PM)  Orders Added: 1)  Vit B12 1000 mcg [J3420] 2)  Admin of Injection (IM/SQ) [82956]

## 2010-12-17 NOTE — Assessment & Plan Note (Signed)
Summary: B12inj,tcb   Nurse Visit   Allergies: 1)  Codeine  Medication Administration  Injection # 1:    Medication: Vit B12 1000 mcg    Diagnosis: OTHER VITAMIN B12 DEFICIENCY ANEMIA (ICD-281.1)    Route: IM    Site: L deltoid    Exp Date: 09/2011    Lot #: [id number]    Mfr: [employer]    Patient tolerated injection without complications    Given by: Theresia Lo RN (January 25, 2010 11:13 AM)  Orders Added: 1)  Vit B12 1000 mcg [J3420] 2)  Admin of Injection (IM/SQ) [[address]]   Medication Administration  Injection # 1:    Medication: Vit B12 1000 mcg    Diagnosis: OTHER VITAMIN B12 DEFICIENCY ANEMIA (ICD-281.1)    Route: IM    Site: L deltoid    Exp Date: 09/2011    Lot #: [id number]    Mfr: [employer]    Patient tolerated injection without complications    Given by: Theresia Lo RN (January 25, 2010 11:13 AM)  Orders Added: 1)  Vit B12 1000 mcg [J3420] 2)  Admin of Injection (IM/SQ) [60454]

## 2010-12-19 NOTE — Assessment & Plan Note (Signed)
Summary: b12 shot/bmc   Nurse Visit   Allergies: 1)  Codeine  Medication Administration  Injection # 1:    Medication: Vit B12 1000 mcg    Diagnosis: OTHER VITAMIN B12 DEFICIENCY ANEMIA (ICD-281.1)    Route: IM    Site: R deltoid    Exp Date: 06/2012    Lot #: [id number]    Mfr: [employer]    Patient tolerated injection without complications    Given by: Theresia Lo RN (November 20, 2010 11:38 AM)  Orders Added: 1)  Vit B12 1000 mcg [J3420] 2)  Admin of Injection (IM/SQ) [24401]   Medication Administration  Injection # 1:    Medication: Vit B12 1000 mcg    Diagnosis: OTHER VITAMIN B12 DEFICIENCY ANEMIA (ICD-281.1)    Route: IM    Site: R deltoid    Exp Date: 06/2012    Lot #: [id number]    Mfr: [employer]    Patient tolerated injection without complications    Given by: Theresia Lo RN (November 20, 2010 11:38 AM)  Orders Added: 1)  Vit B12 1000 mcg [J3420] 2)  Admin of Injection (IM/SQ) [64403]

## 2010-12-19 NOTE — Assessment & Plan Note (Signed)
Summary: cpe/kh   Vital Signs:  Patient profile:   77 year old male Height:      66.75 inches Weight:      194 pounds Temp:     97.7 degrees F oral Pulse rate:   84 / minute BP sitting:   142 / 70  (left arm) Cuff size:   regular  Vitals Entered By: Loralee Pacas CMA (November 27, 2010 10:21 AM) CC: cpe Is Patient Diabetic? Yes Comments pt states that he is having some issues with leaking (incontience), and trouble with his throat   Primary Care Provider:  Denny Levy, MD  CC:  cpe.  History of Present Illness: Follow up hypertension. Taking medicines regularly with no problems. Not having any any headaches or chest pains. His cardiologist has changed a couple of meds--has also added a 'cholesterol" med (for his triglyceried. He has questions about that   he got a call from the GI doctor--it is time for repeat colonoscopy. He does not think he wants to do any more. has questions  swallowing is  going well after his last esophageal dilation. Continues on protinix--not a lot of heartburn. wonders if he should continue since he is asx.  doing fairly well through holidays--saw his son and family. Misses his wife (deceased) but coping prettty well> does not feel depressed. Is a little lonely and sometimes the days are long. continues with some urinary hesitancy--no worse. the urologist rtried him on several meds, none of which seemed to help so he stopped them all.  Habits & Providers  Alcohol-Tobacco-Diet     Tobacco Status: quit     Tobacco Counseling: not to resume use of tobacco products     Year Quit: 1971  Exercise-Depression-Behavior     Have you felt down or hopeless? no     Have you felt little pleasure in things? no     Depression Counseling: not indicated; screening negative for depression     Seat Belt Use: always  Current Medications (verified): 1)  Exforge 10-160 Mg  Tabs (Amlodipine Besylate-Valsartan) .Marland Kitchen.. 1 By Mouth Once Daily 2)  Protonix 40 Mg Tbec  (Pantoprazole Sodium) .... Take 1 Tablet By Mouth Once A Day 3)  Hydrochlorothiazide 25 Mg Tabs (Hydrochlorothiazide) .... Take One Tablet By Mouth Every Other Day 4)  Altace 5 Mg Caps (Ramipril) .Marland Kitchen.. 1 By Mouth Once Daily Generic Preferred 5)  Bayer Childrens Aspirin 81 Mg Chew (Aspirin) .... Take 1 Tablet By Mouth Once A Day 6)  Cyanocobalamin 1000 Mcg/ml Soln (Cyanocobalamin) .... Im Injection Once Monthly 7)  Bystolic 5 Mg Tabs (Nebivolol Hcl) .Marland Kitchen.. 1 By Mouth Once Daily 8)  Vitamin B 12 Shot Q M 9)  Fenofibrate Micronized 134 Mg Caps (Fenofibrate Micronized) .Marland Kitchen.. 1 By Mouth Qd 10)  Ocuvite-Lutein  Caps (Multiple Vitamins-Minerals) .Marland Kitchen.. 1 By Mouth Once Daily  Allergies (verified): 1)  Codeine  Family History: Reviewed history from 05/16/2010 and no changes required. Brother-colon ca 70s,  HTN,  F died 80 HTN,  M died 72 parkinsons,  S died 43 `blood infx`,   Social History: Reviewed history from 03/22/2008 and no changes required. Lives in [place] . Wife Thomasenia Sales) deceased 2007-04-19. retired.  2 cats, gardens. ; Prior 40pack-yr tob hx, quit 70yrs ago.  Prior social etoh, none since 60.  Independent ADLs.  Exercise bike daily.  ; Family in area=stepson John in [place], brother in [place], stepdaughter in [place], Museum/gallery conservator in [place].   granddaughter has Hodgkins lyumphoma  03/2008.Seat Belt Use:  always  Review of Systems GU:  Denies urinary hesitancy.  Physical Exam  General:  alert, well-developed, well-nourished, and well-hydrated.   Neck:  supple, full ROM, no masses, no thyromegaly, and no carotid bruits.   Lungs:  normal respiratory effort, normal breath sounds, and no wheezes.   Heart:  normal rate, regular rhythm, no murmur, and no gallop.   Abdomen:  soft, non-tender, and normal bowel sounds.   Msk:  walks with a cane but pretty good speed. seems steady with his cane. can rise from a chair without assistance--a little slow but overall  good. Extremities:  no lower extremity edema Neurologic:  alert & oriented X3 and strength normal in all extremities.   Psych:  Oriented X3, memory intact for recent and remote, normally interactive, good eye contact, not anxious appearing, and not depressed appearing.     Impression & Recommendations:  Problem # 1:  HYPERTENSION, BENIGN SYSTEMIC (ICD-401.1)  Orders: Comp Met-[facility] ([address]) CBC-[facility] (09811) [facility]- Est  Level 4 (91478) doing well  Problem # 2:  ADENOCARCINOMA, COLON, FAMILY HX (ICD-V16.0)  Orders: CBC-[facility] (29562) [facility]- Est  Level 4 (13086) we discussed his colonoscopy plans--he does not want to have any more screenings and I agree with that plan as I think risk outweighs benefit  Problem # 3:  DYSPHAGIA UNSPECIFIED (ICD-787.20)  Orders: [facility]- Est  Level 4 (57846) doing well currently continue oprotonix  Complete Medication List: 1)  Exforge 10-160 Mg Tabs (Amlodipine besylate-valsartan) .Marland Kitchen.. 1 by mouth once daily 2)  Protonix 40 Mg Tbec (Pantoprazole sodium) .... Take 1 tablet by mouth once a day 3)  Hydrochlorothiazide 25 Mg Tabs (Hydrochlorothiazide) .... Take one tablet by mouth every other day 4)  Altace 5 Mg Caps (Ramipril) .Marland Kitchen.. 1 by mouth once daily generic preferred 5)  Bayer Childrens Aspirin 81 Mg Chew (Aspirin) .... Take 1 tablet by mouth once a day 6)  Cyanocobalamin 1000 Mcg/ml Soln (Cyanocobalamin) .... Im injection once monthly 7)  Bystolic 5 Mg Tabs (Nebivolol hcl) .Marland Kitchen.. 1 by mouth once daily 8)  Vitamin B 12 Shot Q M  9)  Fenofibrate Micronized 134 Mg Caps (Fenofibrate micronized) .Marland Kitchen.. 1 by mouth qd 10)  Ocuvite-lutein Caps (Multiple vitamins-minerals) .Marland Kitchen.. 1 by mouth once daily Comp Met-[facility] ([address]-28413) CBC-[facility] (24401) [facility]- Est  Level 4 (02725)  Patient Instructions: 1)  Great to see you. Let me see you back in 3-4 months---sooner if something comes up. Prescriptions: HYDROCHLOROTHIAZIDE 25 MG TABS (HYDROCHLOROTHIAZIDE) take one tablet by  mouth every other day  #45 x 3   Entered and Authorized by:   Denny Levy MD   Signed by:   Denny Levy MD on 11/27/2010   Method used:   Print then Give to Patient   RxID:   [id number] PROTONIX 40 MG TBEC (PANTOPRAZOLE SODIUM) Take 1 tablet by mouth once a day  #90 x 3   Entered and Authorized by:   Denny Levy MD   Signed by:   Denny Levy MD on 11/27/2010   Method used:   Print then Give to Patient   RxID:   [id number] EXFORGE 10-160 MG  TABS (AMLODIPINE BESYLATE-VALSARTAN) 1 by mouth once daily  #90 x 3   Entered and Authorized by:   Denny Levy MD   Signed by:   Denny Levy MD on 11/27/2010   Method used:   Print then Give to Patient   RxID:   [id number]    Orders Added: 1)  Comp  Met-[facility] [21308-65784] 2)  CBC-[facility] [85027] 3)  [facility]- Est  Level 4 [69629]     Impression & Recommendations:  His updated medication list for this problem includes:    Exforge 10-160 Mg Tabs (Amlodipine besylate-valsartan) .Marland Kitchen... 1 by mouth once daily    Hydrochlorothiazide 25 Mg Tabs (Hydrochlorothiazide) .Marland Kitchen... Take one tablet by mouth every other day    Altace 5 Mg Caps (Ramipril) .Marland Kitchen... 1 by mouth once daily generic preferred    Bystolic 5 Mg Tabs (Nebivolol hcl) .Marland Kitchen... 1 by mouth once daily  Orders: Comp Met-[facility] (528[address]-32440) CBC-[facility] (10272) [facility]- Est  Level 4 (53664)   Orders: CBC-[facility] (40347) [facility]- Est  Level 4 (42595)   Orders: [facility]- Est  Level 4 (63875)   Complete Medication List: 1)  Exforge 10-160 Mg Tabs (Amlodipine besylate-valsartan) .Marland Kitchen.. 1 by mouth once daily 2)  Protonix 40 Mg Tbec (Pantoprazole sodium) .... Take 1 tablet by mouth once a day 3)  Hydrochlorothiazide 25 Mg Tabs (Hydrochlorothiazide) .... Take one tablet by mouth every other day 4)  Altace 5 Mg Caps (Ramipril) .Marland Kitchen.. 1 by mouth once daily generic preferred 5)  Bayer Childrens Aspirin 81 Mg Chew (Aspirin) .... Take 1 tablet by mouth once a day 6)  Cyanocobalamin 1000 Mcg/ml Soln (Cyanocobalamin) .... Im  injection once monthly 7)  Bystolic 5 Mg Tabs (Nebivolol hcl) .Marland Kitchen.. 1 by mouth once daily 8)  Vitamin B 12 Shot Q M  9)  Fenofibrate Micronized 134 Mg Caps (Fenofibrate micronized) .Marland Kitchen.. 1 by mouth qd 10)  Ocuvite-lutein Caps (Multiple vitamins-minerals) .Marland Kitchen.. 1 by mouth once daily   Prevention & Chronic Care Immunizations   Influenza vaccine: Fluvax 3+  (08/14/2010)   Influenza vaccine due: 07/18/2010    Tetanus booster: 11/17/2001: Done.   Tetanus booster due: 11/18/2011    Pneumococcal vaccine: Done.  (11/18/1995)   Pneumococcal vaccine deferral: Not indicated  (07/05/2009)   Pneumococcal vaccine due: None    H. zoster vaccine: 09/18/2010: Zostavax  Colorectal Screening   Hemoccult: not indicated had colonoscopy  (11/14/2008)   Hemoccult due: Not Indicated    Colonoscopy: normal  (10/28/2006)   Colonoscopy due: 10/28/2016  Other Screening   PSA: done at urology office  (02/02/2008)   PSA action/deferral: Not indicated  (07/05/2009)   PSA due due: 02/01/2009   Smoking status: quit  (11/27/2010)  Lipids   Total Cholesterol: 152  (07/05/2008)   Lipid panel action/deferral: Not indicated   LDL: 61  (07/05/2008)   LDL Direct: 91  (12/26/2009)   HDL: 28  (07/05/2008)   Triglycerides: 315  (07/05/2008)   Lipid panel due: 09/04/2009  Hypertension   Last Blood Pressure: 142 / 70  (11/27/2010)   Serum creatinine: 1.45  (05/29/2010)   BMP action: Ordered   Serum potassium 5.0  (05/29/2010) CMP ordered     Hypertension flowsheet reviewed?: Yes   Progress toward BP goal: At goal  Self-Management Support :   Personal Goals (by the next clinic visit) :      Personal blood pressure goal: 165/95  (10/10/2009)   Hypertension self-management support: Written self-care plan  (07/30/2009)    Hypertension self-management support not done because: Good outcomes  (10/10/2009)

## 2010-12-19 NOTE — Miscellaneous (Signed)
   Clinical Lists Changes  Problems: Added new problem of CHRONIC KIDNEY DISEASE STAGE III (MODERATE) (ICD-585.3) Orders: Added new Test order of Basic Met-[facility] [phone]) - Signed      Complete Medication List: 1)  Exforge 10-160 Mg Tabs (Amlodipine besylate-valsartan) .Marland Kitchen.. 1 by mouth once daily 2)  Protonix 40 Mg Tbec (Pantoprazole sodium) .... Take 1 tablet by mouth once a day 3)  Hydrochlorothiazide 25 Mg Tabs (Hydrochlorothiazide) .... Take one tablet by mouth every other day 4)  Altace 5 Mg Caps (Ramipril) .Marland Kitchen.. 1 by mouth once daily generic preferred 5)  Bayer Childrens Aspirin 81 Mg Chew (Aspirin) .... Take 1 tablet by mouth once a day 6)  Cyanocobalamin 1000 Mcg/ml Soln (Cyanocobalamin) .... Im injection once monthly 7)  Bystolic 5 Mg Tabs (Nebivolol hcl) .Marland Kitchen.. 1 by mouth once daily 8)  Vitamin B 12 Shot Q M  9)  Fenofibrate Micronized 134 Mg Caps (Fenofibrate micronized) .Marland Kitchen.. 1 by mouth qd 10)  Ocuvite-lutein Caps (Multiple vitamins-minerals) .Marland Kitchen.. 1 by mouth once daily

## 2010-12-19 NOTE — Letter (Signed)
Summary: Generic Letter  [facility]  [address]   [place], [place] [address]   Phone: [phone]  Fax: [phone]    12/03/2010  [name] [address] [place], [place]  [address]  Dear Mr. Lindahl, All of your lab work looked essentially OK. Your kidney function was a little elevated--I think we should recheck it in a month.   You can come by for a lab only---no need to fast---say mid February.  Great to see you!          Sincerely,   Denny Levy MD  Appended Document: Generic Letter mailed

## 2010-12-20 ENCOUNTER — Encounter: Payer: Self-pay | Admitting: *Deleted

## 2010-12-20 ENCOUNTER — Encounter: Payer: Self-pay | Admitting: Family Medicine

## 2010-12-20 ENCOUNTER — Ambulatory Visit (INDEPENDENT_AMBULATORY_CARE_PROVIDER_SITE_OTHER): Payer: Medicare Other

## 2010-12-20 DIAGNOSIS — D518 Other vitamin B12 deficiency anemias: Secondary | ICD-10-CM

## 2010-12-20 LAB — CONVERTED CEMR LAB
Chloride: 103 meq/L (ref 96–112)
Potassium: 5 meq/L (ref 3.5–5.3)
Sodium: 138 meq/L (ref 135–145)

## 2010-12-24 ENCOUNTER — Encounter: Payer: Self-pay | Admitting: Family Medicine

## 2010-12-25 NOTE — Assessment & Plan Note (Signed)
Summary: B 12 Injection   Nurse Visit   Allergies: 1)  Codeine  Medication Administration  Injection # 1:    Medication: Vit B12 1000 mcg    Diagnosis: OTHER VITAMIN B12 DEFICIENCY ANEMIA (ICD-281.1)    Route: IM    Site: L deltoid    Exp Date: 04/2012    Lot #: [id number]    Mfr: [employer]    Patient tolerated injection without complications    Given by: Theresia Lo RN (December 20, 2010 11:58 AM)  Orders Added: 1)  Vit B12 1000 mcg [J3420] 2)  Admin of Injection (IM/SQ) [[address]]   Medication Administration  Injection # 1:    Medication: Vit B12 1000 mcg    Diagnosis: OTHER VITAMIN B12 DEFICIENCY ANEMIA (ICD-281.1)    Route: IM    Site: L deltoid    Exp Date: 04/2012    Lot #: [id number]    Mfr: [employer]    Patient tolerated injection without complications    Given by: Theresia Lo RN (December 20, 2010 11:58 AM)  Orders Added: 1)  Vit B12 1000 mcg [J3420] 2)  Admin of Injection (IM/SQ) [60454]

## 2011-01-02 NOTE — Letter (Signed)
Summary: Generic Letter  [facility]  [address]   [place], [place] [address]   Phone: [phone]  Fax: [phone]    12/24/2010  [facility] [address] [place], [place]  [address]  [place]  Dear Mr. Hulsey, Your kidney function was up just a bit. Let's check it again in about a month--lab only. Great to see you. Everything else was A OK.          Sincerely,   Denny Levy MD  Appended Document: Generic Letter mailed

## 2011-01-20 ENCOUNTER — Other Ambulatory Visit: Payer: Medicare Other

## 2011-01-20 ENCOUNTER — Encounter: Payer: Self-pay | Admitting: Family Medicine

## 2011-01-20 ENCOUNTER — Ambulatory Visit (INDEPENDENT_AMBULATORY_CARE_PROVIDER_SITE_OTHER): Payer: Medicare Other | Admitting: *Deleted

## 2011-01-20 DIAGNOSIS — E538 Deficiency of other specified B group vitamins: Secondary | ICD-10-CM

## 2011-01-20 LAB — CONVERTED CEMR LAB
BUN: 25 mg/dL — ABNORMAL HIGH (ref 6–23)
CO2: 24 meq/L (ref 19–32)
Chloride: 100 meq/L (ref 96–112)
Creatinine, Ser: 1.7 mg/dL — ABNORMAL HIGH (ref 0.40–1.50)
Potassium: 4.8 meq/L (ref 3.5–5.3)

## 2011-01-20 LAB — BASIC METABOLIC PANEL
BUN: 25 mg/dL — ABNORMAL HIGH (ref 6–23)
Chloride: 100 mEq/L (ref 96–112)
Glucose, Bld: 162 mg/dL — ABNORMAL HIGH (ref 70–99)
Potassium: 4.8 mEq/L (ref 3.5–5.3)

## 2011-01-20 MED ORDER — CYANOCOBALAMIN 1000 MCG/ML IJ SOLN
1000.0000 ug | Freq: Once | INTRAMUSCULAR | Status: AC
  Administered 2011-01-20: 1000 ug via INTRAMUSCULAR

## 2011-01-21 ENCOUNTER — Emergency Department (HOSPITAL_COMMUNITY): Payer: Medicare Other

## 2011-01-21 ENCOUNTER — Encounter: Payer: Self-pay | Admitting: Family Medicine

## 2011-01-21 ENCOUNTER — Inpatient Hospital Stay (HOSPITAL_COMMUNITY)
Admission: EM | Admit: 2011-01-21 | Discharge: 2011-01-25 | DRG: 388 | Disposition: A | Discharge status: Home Health | Payer: Medicare Other | Attending: Family Medicine | Admitting: Family Medicine

## 2011-01-21 DIAGNOSIS — I509 Heart failure, unspecified: Secondary | ICD-10-CM | POA: Diagnosis present

## 2011-01-21 DIAGNOSIS — N179 Acute kidney failure, unspecified: Secondary | ICD-10-CM | POA: Diagnosis present

## 2011-01-21 DIAGNOSIS — N189 Chronic kidney disease, unspecified: Secondary | ICD-10-CM | POA: Diagnosis present

## 2011-01-21 DIAGNOSIS — J189 Pneumonia, unspecified organism: Secondary | ICD-10-CM | POA: Diagnosis present

## 2011-01-21 DIAGNOSIS — Z950 Presence of cardiac pacemaker: Secondary | ICD-10-CM

## 2011-01-21 DIAGNOSIS — E860 Dehydration: Secondary | ICD-10-CM | POA: Diagnosis present

## 2011-01-21 DIAGNOSIS — R55 Syncope and collapse: Secondary | ICD-10-CM | POA: Diagnosis present

## 2011-01-21 DIAGNOSIS — K56609 Unspecified intestinal obstruction, unspecified as to partial versus complete obstruction: Principal | ICD-10-CM | POA: Diagnosis present

## 2011-01-21 DIAGNOSIS — I129 Hypertensive chronic kidney disease with stage 1 through stage 4 chronic kidney disease, or unspecified chronic kidney disease: Secondary | ICD-10-CM | POA: Diagnosis present

## 2011-01-21 DIAGNOSIS — H269 Unspecified cataract: Secondary | ICD-10-CM | POA: Diagnosis present

## 2011-01-21 DIAGNOSIS — E538 Deficiency of other specified B group vitamins: Secondary | ICD-10-CM | POA: Diagnosis present

## 2011-01-21 DIAGNOSIS — Z7982 Long term (current) use of aspirin: Secondary | ICD-10-CM

## 2011-01-21 DIAGNOSIS — K219 Gastro-esophageal reflux disease without esophagitis: Secondary | ICD-10-CM | POA: Diagnosis present

## 2011-01-21 LAB — DIFFERENTIAL
Basophils Relative: 0 % (ref 0–1)
Eosinophils Absolute: 0.2 10*3/uL (ref 0.0–0.7)
Monocytes Absolute: 1.1 10*3/uL — ABNORMAL HIGH (ref 0.1–1.0)
Monocytes Relative: 7 % (ref 3–12)
Neutro Abs: 11.8 10*3/uL — ABNORMAL HIGH (ref 1.7–7.7)

## 2011-01-21 LAB — BASIC METABOLIC PANEL
BUN: 28 mg/dL — ABNORMAL HIGH (ref 6–23)
CO2: 24 mEq/L (ref 19–32)
Calcium: 9.4 mg/dL (ref 8.4–10.5)
Creatinine, Ser: 2.46 mg/dL — ABNORMAL HIGH (ref 0.4–1.5)
GFR calc Af Amer: 30 mL/min — ABNORMAL LOW (ref >60)

## 2011-01-21 LAB — CBC
Hemoglobin: 13.7 g/dL (ref 13.0–17.0)
MCH: 30.5 pg (ref 26.0–34.0)
MCHC: 33.7 g/dL (ref 30.0–36.0)

## 2011-01-21 NOTE — Progress Notes (Signed)
Late entry          patient reported that he feels his kidney function test are up due to the medication he is on for cholesterol fenofibrate. Brings in a print out from pharmacy about side effects. Has been on this med for 6 months he states. Also states when he checks BP at home it is usually higher than normally. BP checked in office 150/83 pulse 73. Dr. Jennette Kettle notified and she came in to see patient and discuss.

## 2011-01-22 ENCOUNTER — Encounter: Payer: Self-pay | Admitting: Family Medicine

## 2011-01-22 DIAGNOSIS — N179 Acute kidney failure, unspecified: Secondary | ICD-10-CM

## 2011-01-22 DIAGNOSIS — J159 Unspecified bacterial pneumonia: Secondary | ICD-10-CM

## 2011-01-22 DIAGNOSIS — K56609 Unspecified intestinal obstruction, unspecified as to partial versus complete obstruction: Secondary | ICD-10-CM

## 2011-01-22 LAB — COMPREHENSIVE METABOLIC PANEL
ALT: 17 U/L (ref 0–53)
Alkaline Phosphatase: 28 U/L — ABNORMAL LOW (ref 39–117)
CO2: 25 mEq/L (ref 19–32)
Chloride: 106 mEq/L (ref 96–112)
Glucose, Bld: 152 mg/dL — ABNORMAL HIGH (ref 70–99)
Potassium: 4.4 mEq/L (ref 3.5–5.1)
Sodium: 138 mEq/L (ref 135–145)
Total Protein: 5.9 g/dL — ABNORMAL LOW (ref 6.0–8.3)

## 2011-01-22 LAB — CBC
HCT: 35.6 % — ABNORMAL LOW (ref 39.0–52.0)
MCH: 29.7 pg (ref 26.0–34.0)
MCHC: 33.1 g/dL (ref 30.0–36.0)
MCV: 89.7 fL (ref 78.0–100.0)
RDW: 13.7 % (ref 11.5–15.5)

## 2011-01-22 NOTE — H&P (Addendum)
Family Medicine Teaching [facility] Admission History and Physical  Patient name: [name] Medical record number: [id number] Date of birth: 1920/01/25 Age: 77 y.o. Gender: male  Primary Care Provider: Denny Levy, MD  Chief Complaint: Nausea/Vomiting History of Present Illness: [name] is a 77 y.o. year old male presenting with nausea and vomiting.  Pt. States that he felt fine this am and had breakfast.  After breakfast he started feeling bad, had nausea and began vomiting.    Patient does admit to intermittent abdominal pain for the past two weeks, mainly on his left side.  He has had normal bowel movements daily.  Denies blood in his stool, but does have occasional diarrhea.  Of note he did have a previous SBO in 2006 and had exploratory laparotomy for lysis of adhesions done by Dr. Ezzard Standing.  He also has had cough for the past few days and some pain with deep inspiration. Also has had some difficulty passing urine along with some frequency at night.   He denies headache, palpitations, or shortness of breath.  Did have syncopal episode in ED after vomiting.     Patient Active Problem List  Diagnoses  . OTHER VITAMIN B12 DEFICIENCY ANEMIA  . GLAUCOMA  . CATARACT  . HYPERTENSION, BENIGN SYSTEMIC  . SICK SINUS SYNDROME  . ALLERGIC RHINITIS  . ESOPHAGEAL STRICTURE  . GASTROESOPHAGEAL REFLUX, NO ESOPHAGITIS  . GASTRITIS, ACUTE W/O HEMORRHAGE  . HIATAL HERNIA  . DIVERTICULOSIS, COLON  . BENIGN PROSTATIC HYPERTROPHY, WITH URINARY OBSTRUCTION  . HIP PAIN, LEFT  . EDEMA  . HOARSENESS  . DYSPNEA  . OTHER DYSPNEA AND RESPIRATORY ABNORMALITIES  . DYSPHAGIA UNSPECIFIED  . COLONIC POLYPS, HYPERPLASTIC, HX OF  . CHRONIC KIDNEY DISEASE STAGE III (MODERATE)   Past Medical History: No past medical history on file.  Past Surgical History: No past surgical history on file.  Social History: History   Social History  . Marital Status: Widowed    Spouse Name: N/A    Number of  Children: N/A  . Years of Education: N/A   Social History Main Topics  . Smoking status: Not on file  . Smokeless tobacco: Not on file  . Alcohol Use: Not on file  . Drug Use: Not on file  . Sexually Active: Not on file   Other Topics Concern  . Not on file   Social History Narrative  . No narrative on file    Family History: No family history on file.  Allergies: Allergies  Allergen Reactions  . Codeine     REACTION: nausea    Current Outpatient Prescriptions  Medication Sig Dispense Refill  . amLODipine-valsartan (EXFORGE) 10-160 MG per tablet Take 1 tablet by mouth daily.        Marland Kitchen aspirin (ASPIRIN CHILDRENS) 81 MG chewable tablet Chew 81 mg by mouth daily.        . Cyanocobalamin (VITAMIN B-12 IJ) Inject as directed. Once a month       . cyanocobalamin 1000 MCG/ML injection Inject into the muscle. IM injection once monthly       . hydrochlorothiazide 25 MG tablet Take 25 mg by mouth. Take one tab every other day       . nebivolol (BYSTOLIC) 10 MG tablet Take 10 mg by mouth daily.        . pantoprazole (PROTONIX) 40 MG tablet Take 40 mg by mouth daily.        . ramipril (ALTACE) 5 MG capsule Take 5 mg  by mouth daily. Generic preferred        Review Of Systems: Per HPI  Otherwise 12 point review of systems was performed and was unremarkable.  Physical Exam: Pulse: 98.2 Blood Pressure: 115/58 RR: 15   O2: 98% on RA Temp: 98.2  HR 67  >1000 cc out from NG tube General: alert, appears stated age, no distress and NG tube in place HEENT: PERRLA, extra ocular movement intact, sclera clear, anicteric, oropharynx clear, no lesions, neck supple with midline trachea and thyroid without masses Heart: 2/6 SEM is heard throughout the precordium, distant heart sounds, regular rate and rhythm Lungs: clear to auscultation, no wheezes or rales and unlabored breathing Abdomen: abdomen is soft without significant tenderness, masses, organomegaly or guarding, however there is mild  distention Extremities: extremities normal, atraumatic, no cyanosis or edema Skin: Multiple cherry angiomas, otherwise unremarkable Neurology: normal without focal findings and mental status, speech normal, alert and oriented x3  Labs and Imaging: Lab Results  Component Value Date/Time   NA 134* 01/21/2011  7:56 PM   K 4.3 01/21/2011  7:56 PM   CL 99 01/21/2011  7:56 PM   CO2 24 01/21/2011  7:56 PM   BUN 28* 01/21/2011  7:56 PM   CREATININE 2.46* 01/21/2011  7:56 PM   CREATININE 1.70* 01/20/2011 11:31 AM   GLUCOSE 164* 01/21/2011  7:56 PM   Lab Results  Component Value Date   WBC 14.9* 01/21/2011   HGB 13.7 01/21/2011   HCT 40.7 01/21/2011   MCV 90.6 01/21/2011   PLT 271 01/21/2011   Troponin: 0.02  Abd X-Ray:       1.  Significant stool within nondilated loops of colon.   2.  Dilated central small bowel loops containing air fluid levels.   Findings are consistent with small bowel obstruction, either   partial or early.  There is also concern regarding bowel wall   thickening.  Further evaluation with CT of the abdomen and pelvis   with intravenous and oral contrast may be helpful. CT Abd:   1.  Partial/low grade small bwel obstruction patterno.  Some of the   small bowel loops are angulated, suggesting the obstruction may be   due to adhesions.  No mass lesion or focal inflammatory change is   seen.  There is a relative transition point in the right aspect of   the pelvis.   2. Nasogastric tube terminates in the distal esophagus. Suggest   advancement of nasogastric tube.   3.  Patchy airspace disease in the left lower lobe. Findings are   consistent with aspiration or pneumonia.   4.  Diverticulosis of the sigmoid colon.   3.  Atherosclerotic disease of the abdominal aorta with focal   infrarenal abdominal aortic ectasia.  Negative for aneurysm.   Assessment and Plan: [name] is a 77 y.o. year old male presenting with nausea/vomiting with syncopal episode in ED after  vomiting 1. Small Bowel Obstruction:  Will continue NG tube to intermittent suction and hydrate patient.  Per ED physician Dr. Violeta Gelinas saw pt. In ED and will follow along.  Keep NPO except meds for now.   2.  Pneumonia:  PNA vs. Aspiration on CT, since patient has had cough and pain with inspiration prior to this episode today along with elevated white count, will treat as community acquired pneumonia.  If worsening will consider change in antibiotic coverage to cover for aspiration.  Recheck CBC in am.    3.  HTN:  Continue home medications   4.  GERD w/ history of gastritis:  Continue protonix per home regimen 5.  Acute on chronic renal failure:  Cr. Has slowly been trending up over past few months, last on 01/20/11 was 1.7.  Today 2.46, likely 2/2 to vomiting and dehydration.  Will hydrate and see if this improves.   6. FEN/GI: NPO except for meds, NS @ 150cc/hr 7. Prophylaxis: Heparin 5000 units sq tid 8. Disposition: Pending clinical improvement 9. Code:  Limited, no compressions or shocks.  Does wish to have intubation if needed.

## 2011-01-23 ENCOUNTER — Inpatient Hospital Stay (HOSPITAL_COMMUNITY): Payer: Medicare Other

## 2011-01-23 LAB — CBC
MCV: 91.1 fL (ref 78.0–100.0)
Platelets: 160 10*3/uL (ref 150–400)
RDW: 14.2 % (ref 11.5–15.5)
WBC: 7.7 10*3/uL (ref 4.0–10.5)

## 2011-01-23 LAB — BASIC METABOLIC PANEL
BUN: 28 mg/dL — ABNORMAL HIGH (ref 6–23)
Creatinine, Ser: 2.01 mg/dL — ABNORMAL HIGH (ref 0.4–1.5)
GFR calc non Af Amer: 32 mL/min — ABNORMAL LOW (ref >60)
Potassium: 3.8 mEq/L (ref 3.5–5.1)

## 2011-01-24 LAB — CBC
HCT: 31 % — ABNORMAL LOW (ref 39.0–52.0)
Platelets: 166 10*3/uL (ref 150–400)
RDW: 13.6 % (ref 11.5–15.5)
WBC: 6.5 10*3/uL (ref 4.0–10.5)

## 2011-01-24 LAB — BASIC METABOLIC PANEL
GFR calc Af Amer: 44 mL/min — ABNORMAL LOW (ref >60)
GFR calc non Af Amer: 37 mL/min — ABNORMAL LOW (ref >60)
Potassium: 3.2 mEq/L — ABNORMAL LOW (ref 3.5–5.1)
Sodium: 138 mEq/L (ref 135–145)

## 2011-01-25 LAB — BASIC METABOLIC PANEL
CO2: 22 mEq/L (ref 19–32)
Glucose, Bld: 115 mg/dL — ABNORMAL HIGH (ref 70–99)
Potassium: 3.9 mEq/L (ref 3.5–5.1)
Sodium: 137 mEq/L (ref 135–145)

## 2011-01-25 LAB — CBC
HCT: 31 % — ABNORMAL LOW (ref 39.0–52.0)
Hemoglobin: 10.4 g/dL — ABNORMAL LOW (ref 13.0–17.0)
RBC: 3.45 MIL/uL — ABNORMAL LOW (ref 4.22–5.81)
WBC: 6.1 10*3/uL (ref 4.0–10.5)

## 2011-01-26 NOTE — H&P (Signed)
NAME:  CAISON, HEARN NO.:  [id number]  MEDICAL RECORD NO.:  [id number]           PATIENT TYPE:  E  LOCATION:  [facility]                         FACILITY:  [facility]  PHYSICIAN:  Jyren Cerasoli A. Sheffield Slider, M.D.    DATE OF BIRTH:  [birth year]  DATE OF ADMISSION:  01/21/2011 DATE OF DISCHARGE:                             HISTORY & PHYSICAL   PRIMARY CARE PROVIDER:  Nestor Ramp, MD at the [facility].  CHIEF COMPLAINT:  Nausea and vomiting.  HISTORY OF PRESENT ILLNESS:  Mr. Engelhard is an 77 year old male, who presented with nausea and vomiting.  The patient states that he felt fine this morning and tolerated breakfast.  After breakfast, he started feeling bad, had nausea and began vomiting.  The patient did admit to intermittent abdominal pain for the past 2 weeks, mainly on his left side.  He has had normal bowel movements daily.  Denies blood in his stool, but does have occasional diarrhea.  Of note, he did have a previous small bowel obstruction in 2006 and had exploratory laparotomy for lysis of adhesions done by Dr. Ezzard Standing.  He also had cough for the past few days and some pain with deep inspiration.  Also has had difficulty passing urine along with some frequency at night.  He denies headache, palpitations, shortness of breath.  He did have syncopal episode in the ED after vomiting this afternoon.  PAST MEDICAL HISTORY: 1. B12 deficiency. 2. Cataracts bilateral. 3. Hypertension. 4. Sick sinus syndrome, status post pacemaker. 5. Allergic rhinitis. 6. Esophageal stricture. 7. Gastroesophageal reflux disease. 8. Gastritis. 9. Hiatal hernia. 10.Diverticulosis. 11.BPH. 12.Hip pain. 13.History of colonic polyps. 14.Chronic kidney disease.  PAST SURGICAL HISTORY: 1. Pacemaker. 2. Exploratory laparotomy.  SOCIAL HISTORY:  He is widowed.  He does have a stepson and two stepdaughters.  He has a remote history of smoking and alcohol use, but has not  done either one in greater than 20 years.  ALLERGIES:  CODEINE.  MEDICATIONS: 1. Exforge 10/160 mg 1 tablet p.o. daily. 2. Aspirin 81 mg 1 tablet p.o. daily. 3. Vitamin B12 injections every month. 4. Hydrochlorothiazide 25 mg 1 tablet every other day. 5. Bystolic 10 mg p.o. daily. 6. Protonix 40 mg p.o. daily. 7. Altace 5 mg p.o. daily.  PHYSICAL EXAMINATION:  VITAL SIGNS:  Heart rate 67, blood pressure 115/58, respirations 15, O2 98% on room air, temperature 98.2.  He has had greater than 1000 mL out from NG tube. GENERAL:  Alert, appears stated age, no distress.  NG tube in place. HEENT:  Pupils equal, round, and reactive to light.  Extraocular movement intact.  Sclerae are clear and anicteric.  Oropharynx clear. No lesions. NECK:  Supple with midline trachea and thyroid without masses. HEART:  2/6 systolic ejection murmurs heard throughout the precordium. Distant heart sounds with regular rate and rhythm. LUNGS:  Clear to auscultation.  No wheezes or rales and unlabored breathing. ABDOMEN:  Soft without significant tenderness, masses, organomegaly, or guarding.  There is mild distention present. EXTREMITIES:  Normal, atraumatic without cyanosis or edema. SKIN:  Multiple cherry angiomas, otherwise unremarkable. NEUROLOGIC:  Normal without focal findings.  Mental status; speech normal, alert and oriented x3.  LABS AND IMAGING:  BMET with sodium 134, potassium 4.3, chloride 99, bicarb of 24, BUN of 28, creatinine 2.46, glucose 164.  CBC with WBC of 14.9, hemoglobin 13.7, hematocrit of 40.7, platelets of 271.  Troponin was 0.02.  Abdominal x-ray with impression: 1. Significant stools with nondilated loops of colon. 2. Dilated central small bowel loops containing air-fluid levels.     Findings are consistent with small bowel obstruction either partial     or early, also concerned regarding bowel wall thickening.  CT of the abdomen: 1. Partial low-grade small bowel  obstruction pattern, some of the     small bowel loops are angulated suggesting obstruction maybe due to     adhesions.  No mass, lesion, or focal inflammatory change is seen.     There is relative transition point in the right aspect of the     pelvis. 2. Nasogastric tube, turbinates, and distal esophagus. 3. Patchy airspace disease in the left lower lobe findings are     consistent with aspiration or pneumonia. 4. Diverticulosis of the sigmoid. 5. Atherosclerotic disease with abdominal aorta with focal infrarenal     abdominal aortic ectasia, negative for aneurysm.  ASSESSMENT AND PLAN:  Mr. Franks is an 77 year old male presenting with nausea and vomiting with syncopal episode in the ED after vomiting. 1. Small bowel obstruction.  We will continue NG tube to intermittent     suction and hydrate the patient.  Per ED physician, Dr. Violeta Gelinas saw the patient in ED and will follow along.  We will keep     n.p.o. except meds for now. 2. Pneumonia versus aspiration on CT.  Since the patient has had cough     and pain with inspiration prior to this episode, today with     elevated white count, we will treat this community-acquired     pneumonia.  If worsening, we will consider change in antibiotic     coverage to cover for aspiration.  Recheck CBC in the morning. 3. Hypertension.  Continue home medications. 4. Gastroesophageal reflux disease and history of gastritis.  Continue     Protonix per home regimen. 5. Acute on chronic renal failure.  Creatinine has slowly been     trending up over the past few months.  Last creatinine on January 20, 2011 with 1.7.  Today, it is 2.46, likely secondary to vomiting and     dehydration.  We will hydrate and see if this improves. 6. Syncopal episode, likely secondary to vomiting; however, given     cardiac history, we will cycle cardiac enzymes.  No further     symptoms of lightheadedness, dizziness, or feeling of passing out     during the  interview or exam. 7. Fluids, electrolytes, nutrition/gastrointestinal.  n.p.o. except     for meds.  Normal saline at 150 mL per hour. 8. Prophylaxis.  Heparin 5000 units subcu t.i.d. 9. Disposition.  Pending clinical improvement. 10.Code status, limited.  No compressions or shocks.  Does wish to     have intubation if needed.    ______________________________ Everrett Coombe, MD   ______________________________ Arnette Norris. Sheffield Slider, M.D.    CM/MEDQ  D:  01/22/2011  T:  01/22/2011  Job:  [id number]  Electronically Signed by Everrett Coombe MD on 01/24/2011 12:00:41 AM Electronically Signed by Zachery Dauer M.D. on 01/26/2011 08:54:25 PM

## 2011-01-28 ENCOUNTER — Encounter: Payer: Self-pay | Admitting: Home Health Services

## 2011-01-28 ENCOUNTER — Inpatient Hospital Stay (HOSPITAL_COMMUNITY)
Admission: AD | Admit: 2011-01-28 | Discharge: 2011-01-31 | DRG: 554 | Disposition: A | Discharge status: Home / Self Care | Payer: Medicare Other | Source: Ambulatory Visit | Attending: Family Medicine | Admitting: Family Medicine

## 2011-01-28 ENCOUNTER — Encounter: Payer: Self-pay | Admitting: Family Medicine

## 2011-01-28 ENCOUNTER — Ambulatory Visit (INDEPENDENT_AMBULATORY_CARE_PROVIDER_SITE_OTHER): Payer: Medicare Other | Admitting: Family Medicine

## 2011-01-28 DIAGNOSIS — I129 Hypertensive chronic kidney disease with stage 1 through stage 4 chronic kidney disease, or unspecified chronic kidney disease: Secondary | ICD-10-CM | POA: Diagnosis present

## 2011-01-28 DIAGNOSIS — K5730 Diverticulosis of large intestine without perforation or abscess without bleeding: Secondary | ICD-10-CM | POA: Diagnosis present

## 2011-01-28 DIAGNOSIS — M25539 Pain in unspecified wrist: Secondary | ICD-10-CM | POA: Insufficient documentation

## 2011-01-28 DIAGNOSIS — M129 Arthropathy, unspecified: Secondary | ICD-10-CM

## 2011-01-28 DIAGNOSIS — M109 Gout, unspecified: Principal | ICD-10-CM | POA: Diagnosis present

## 2011-01-28 DIAGNOSIS — Z87891 Personal history of nicotine dependence: Secondary | ICD-10-CM

## 2011-01-28 DIAGNOSIS — N400 Enlarged prostate without lower urinary tract symptoms: Secondary | ICD-10-CM | POA: Diagnosis present

## 2011-01-28 DIAGNOSIS — Z96649 Presence of unspecified artificial hip joint: Secondary | ICD-10-CM

## 2011-01-28 DIAGNOSIS — K219 Gastro-esophageal reflux disease without esophagitis: Secondary | ICD-10-CM | POA: Diagnosis present

## 2011-01-28 DIAGNOSIS — N183 Chronic kidney disease, stage 3 unspecified: Secondary | ICD-10-CM | POA: Diagnosis present

## 2011-01-28 DIAGNOSIS — M25569 Pain in unspecified knee: Secondary | ICD-10-CM

## 2011-01-28 DIAGNOSIS — M25469 Effusion, unspecified knee: Secondary | ICD-10-CM

## 2011-01-28 DIAGNOSIS — Z7982 Long term (current) use of aspirin: Secondary | ICD-10-CM

## 2011-01-28 DIAGNOSIS — H409 Unspecified glaucoma: Secondary | ICD-10-CM | POA: Diagnosis present

## 2011-01-28 LAB — BASIC METABOLIC PANEL
BUN: 15 mg/dL (ref 6–23)
Chloride: 98 mEq/L (ref 96–112)
Creatinine, Ser: 1.73 mg/dL — ABNORMAL HIGH (ref 0.4–1.5)
Glucose, Bld: 156 mg/dL — ABNORMAL HIGH (ref 70–99)

## 2011-01-28 LAB — CBC
HCT: 33.2 % — ABNORMAL LOW (ref 39.0–52.0)
MCH: 30.2 pg (ref 26.0–34.0)
MCV: 88.8 fL (ref 78.0–100.0)
Platelets: 259 10*3/uL (ref 150–400)
RBC: 3.74 MIL/uL — ABNORMAL LOW (ref 4.22–5.81)
RDW: 13.7 % (ref 11.5–15.5)

## 2011-01-29 ENCOUNTER — Inpatient Hospital Stay (HOSPITAL_COMMUNITY): Payer: Medicare Other

## 2011-01-29 ENCOUNTER — Encounter: Payer: Self-pay | Admitting: *Deleted

## 2011-01-29 ENCOUNTER — Encounter: Payer: Self-pay | Admitting: Family Medicine

## 2011-01-29 DIAGNOSIS — M7989 Other specified soft tissue disorders: Secondary | ICD-10-CM

## 2011-01-29 LAB — CBC
HCT: 29.5 % — ABNORMAL LOW (ref 39.0–52.0)
MCH: 30.2 pg (ref 26.0–34.0)
MCHC: 34.2 g/dL (ref 30.0–36.0)
MCV: 88.3 fL (ref 78.0–100.0)
Platelets: 222 10*3/uL (ref 150–400)
RDW: 14 % (ref 11.5–15.5)
WBC: 8.6 10*3/uL (ref 4.0–10.5)

## 2011-01-29 LAB — BASIC METABOLIC PANEL
BUN: 16 mg/dL (ref 6–23)
Calcium: 8.2 mg/dL — ABNORMAL LOW (ref 8.4–10.5)
Creatinine, Ser: 1.53 mg/dL — ABNORMAL HIGH (ref 0.4–1.5)
GFR calc non Af Amer: 43 mL/min — ABNORMAL LOW (ref >60)
Glucose, Bld: 121 mg/dL — ABNORMAL HIGH (ref 70–99)
Potassium: 3.3 mEq/L — ABNORMAL LOW (ref 3.5–5.1)

## 2011-01-29 LAB — URIC ACID: Uric Acid, Serum: 6.6 mg/dL (ref 4.0–7.8)

## 2011-01-29 NOTE — H&P (Signed)
Family Medicine Teaching [facility] Admission History and Physical  Patient name: [name] Medical record number: [id number] Date of birth: 09-15-1920 Age: 77 y.o. Gender: male  Primary Care Provider: Denny Levy, MD  Chief Complaint: left hand/wrist and left knee- pain and swelling  History of Present Illness: [name] is a 77 y.o. year old male presenting with left hand/wrist pain and left knee pain that started the afternoon after discharge from hospital on Saturday 01/25/11.   Pt was recently admitted for small bowel obstruction.  Pt noticed some discomfort in both joints (left hand/wrist and left knee) but states that it wasn't bad so he ignored it.  Over the weekend the pain and swelling increased so he decided to come to the hospital for evaluation.  Pt denies n/v/d.  No fever.  No chills, no rashes. No trauma to hand/arm or knee.  + pain with movement of left wrist, left hand fingers and left knee.   Patient Active Problem List  Diagnoses  . OTHER VITAMIN B12 DEFICIENCY ANEMIA  . GLAUCOMA  . CATARACT  . HYPERTENSION, BENIGN SYSTEMIC  . SICK SINUS SYNDROME  . ALLERGIC RHINITIS  . ESOPHAGEAL STRICTURE  . GASTROESOPHAGEAL REFLUX, NO ESOPHAGITIS  . GASTRITIS, ACUTE W/O HEMORRHAGE  . HIATAL HERNIA  . DIVERTICULOSIS, COLON  . BENIGN PROSTATIC HYPERTROPHY, WITH URINARY OBSTRUCTION  . HIP PAIN, LEFT  . EDEMA  . HOARSENESS  . DYSPNEA  . OTHER DYSPNEA AND RESPIRATORY ABNORMALITIES  . DYSPHAGIA UNSPECIFIED  . COLONIC POLYPS, HYPERPLASTIC, HX OF  . CHRONIC KIDNEY DISEASE STAGE III (MODERATE)   Past Medical History: As per above problem list  Past Surgical History: Hip replacement  Social History: Pt denies tobacco (stopped smoking in 1971), no drugs, no etoh (last drink 1989) Pt is retired from [employer]. Wife passed away approx 4 years ago  Family History: Mother- parkinson's, CHF Father- CHF  Allergies: Allergies  Allergen Reactions  . Codeine    REACTION: nausea   Medications (obtained from D/C summary):  1. Azithromycin 500 mg 1 tablet p.o. daily, take for one additional       day.   2. Florastor 250 mg p.o. daily.    3. Aspirin 81 mg p.o. daily.   4. Bystolic 5 mg p.o. daily.   5. Hydrochlorothiazide 25 mg 1 tablet p.o. daily.   6. Protonix 1 tablet p.o. daily.   7. Vitamin B12 injections monthly.    Review Of Systems: Per HPI    Physical Exam: Pulse: 67  Blood Pressure: 177/78 RR: 18   O2: 99 on RA Temp: 98.2  General:resting quietly, sitting in bed watching tv. HEENT: [place], AT Heart: RRR, no m/r/g Lungs: CTA bilateral, no wheezes Abdomen:soft, nt, nd Extremities:  Left hand/wrist: left wrist limited rom 2/2 pain.  All fingers of left hand edematous (5th digit is the most swollen and reddened), dorsal and palmar aspects of hand edematous.  Point tenderness over dorsal aspect of wrist and over 5th digit of left hand.  Warm to touch, light red/pink tone to skin.  No streaking or tracking of red skin tone.  Left knee: Red, warm to touch, + effusion noted around patella, decreased rom 2/2 pain.  Entire knee tender to palpation. Skin:no rash Neurology: a/o x 3, follows all commands  Labs and Imaging: Lab Results  Component Value Date/Time   NA 133* 01/28/2011  6:12 PM   K 3.8 01/28/2011  6:12 PM   CL 98 01/28/2011  6:12 PM   CO2  26 01/28/2011  6:12 PM   BUN 15 01/28/2011  6:12 PM   CREATININE 1.73* 01/28/2011  6:12 PM   CREATININE 1.70* 01/20/2011 11:31 AM   GLUCOSE 156* 01/28/2011  6:12 PM   Lab Results  Component Value Date   WBC 11.6* 01/28/2011   HGB 11.3* 01/28/2011   HCT 33.2* 01/28/2011   MCV 88.8 01/28/2011   PLT 259 01/28/2011      Assessment and Plan: [name] is a 77 y.o. year old male presenting with left hand/wrist pain and left knee pain: 1. Left hand/wrist pain and left knee pain: pt has not had any fever and states that except for pain in joint he feels well. No elevation in WBC count, Therefore  systemic infection less likely.  U/S of left upper ext. Ordered to r/o DVT.  May have local infection in arm since this is the arm that had the iv site at previous hospitalization.  Left knee swollen- states that he has never had this problem before.  Could possibly tap knee but since empiric antibiotics already started most likely this will effect any culture.   Also need to consider the possibility of gout.  Could add colchicine to regimen.  Pt has elevated creatinine so will avoid NSAID at this time.  Will use tramadol as needed for pain. 2. Renal insufficiency-Cr consistent with value obtained at d/c.  Will monitor. 3. HTN- will restart home med regimen- bystolic and hctz to maintain bp control. 4. FEN/GI:regular diet 5. Prophylaxis:heparin 5000units SQ tid 6. Disposition: pending clinical improvement  Dictation [phone]

## 2011-01-30 LAB — BASIC METABOLIC PANEL
BUN: 15 mg/dL (ref 6–23)
Calcium: 8.6 mg/dL (ref 8.4–10.5)
Chloride: 99 mEq/L (ref 96–112)
Creatinine, Ser: 1.34 mg/dL (ref 0.4–1.5)
GFR calc Af Amer: >60 mL/min (ref >60)

## 2011-01-30 LAB — CBC
MCH: 30 pg (ref 26.0–34.0)
MCHC: 33.9 g/dL (ref 30.0–36.0)
MCV: 88.5 fL (ref 78.0–100.0)
Platelets: 294 10*3/uL (ref 150–400)
RDW: 13.9 % (ref 11.5–15.5)

## 2011-02-04 NOTE — Assessment & Plan Note (Signed)
Summary: LEFT WRIST PAIN/SWELLING,MC   Vital Signs:  Patient profile:   77 year old male Pulse rate:   63 / minute BP sitting:   143 / 71  (right arm)  Vitals Entered By: Lillia Pauls CMA (January 28, 2011 2:50 PM) CC: LT WRIST AND KNEE PAIN AND SWELLING   History of Present Illness: 77 year old male seen at [facility], who was d/c from inpatient stay 01/25/2011 for SBO, and had ARF up to cr of 2.5 that ultimately resolved inhouse.  He is currently tolerating by mouth fluids, small amounts of food and passing gas without problems.  1. L knee: redness, swelling, restriction of motion in his left knee that has been ongoing for the last 2 days and worsening. He is walking with some notable difficulty. Restriction is dramatic and 1 week ago, he did not have.  when he had a syncopal event in the ER, he describes, being grabbed by the leg and body, but he did not have any significant knee pain at that time.  2. L dorsal of wrist and forearm, swelling, worsening each day, now with dramatic restriction of motion at the fingers. Redness and warmth on the volar distal forearm. He did have a IV in place on the dorsum of his wrist at day of discharge which was removed.   ROS: significant for improving SBO, improved ARF at time of discharge - but unclear status today. o/w as above. Some occ bladder symptoms. No gross fever, sweats, chills.  Allergies: 1)  Codeine  Past History:  Past medical, surgical, family and social histories (including risk factors) reviewed, and no changes noted (except as noted below).  Past Medical History: Reviewed history from 07/05/2008 and no changes required. 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 appendectomy tonsillectomy carotid dopplers-bilat plaque 0-49% - 11/17/2002,  Cataract surgery - 04/10/2006,  colonoscopy-IBS, severe diverticulosis, `floppy cecum` - 11/17/1996,  ECHO EF55-65% no wall abnmlty - 10/21/2005, EKG SB w/1st degree AVB - 05/02/2003,  Esophageal stricture dilation Dr. 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 Brother-colon ca 70s,  HTN,  F died 22 HTN,  M died 73 parkinsons,  S died 62 `blood infx`,   Social History: Reviewed history from 03/22/2008 and no changes required. Lives in [place] . Wife Thomasenia Sales) deceased 2007-04-12. retired.  2 cats, gardens. ; Prior 40pack-yr tob hx, quit 75yrs ago.  Prior social etoh, none since 28.  Independent ADLs.  Exercise bike daily.  ; Family in area=stepson John in [place], brother in [place], stepdaughter in [place], stepdaughter in [place].   granddaughter has Hodgkins lyumphoma 04-11-08.  Physical Exam  General:  alert, well-developed, well-nourished, and well-hydrated.   Head:  normocephalic and atraumatic.   Ears:  no external deformities.   Nose:  no external deformity.   Msk:  L hand and wrist, dramatic swelling. throughout volar forearm and worsened into hand.  no movement of digit #5, restriction >50% all digits 1-4 at all joints. REDNESS, VOLAR FOREARM PAIN WITH COMPRESSION  L KNEE: ANTERIOR REDNESS MILD / MOD EFFUSION APPROX 40 DEGREES OF MOTION ONLY. PAIN WITH MINIMAL MOVEMENT.   Impression & Recommendations:  Problem # 1:  KNEE  PAIN, LEFT (ICD-719.46) Assessment New I am considerably worried about this patient who was just d/c from the hospital septic joint is a very real possibility.  Severe restriction of motion, redness, bad pain, warmth.  To ER, needs urgent diagnostic tap, CBC. i do not think stable enough for outpatient work-up  ARF last week - reeval renal function imaging needed ASAP  His updated medication list for this problem includes:    Bayer Childrens Aspirin 81 Mg Chew (Aspirin) .Marland Kitchen... Take 1 tablet by mouth once a day  Problem # 2:  WRIST PAIN, LEFT (ICD-719.43) Diffuse wrist swelling, progressive.  anterior redness and pain. concern for possible DVT, UE. cellulitis possible  less likely purely from superficial thrombophlebitis or iv infiltration  again, needs urgent eval for this as well.  Complete Medication List: 1)  Exforge 10-160 Mg Tabs (Amlodipine besylate-valsartan) .Marland Kitchen.. 1 by mouth once daily 2)  Protonix 40 Mg Tbec (Pantoprazole sodium) .... Take 1 tablet by mouth once a day 3)  Hydrochlorothiazide 25 Mg Tabs (Hydrochlorothiazide) .... Take one tablet by mouth every other day 4)  Altace 5 Mg Caps (Ramipril) .Marland Kitchen.. 1 by mouth once daily generic preferred 5)  Bayer Childrens Aspirin 81 Mg Chew (Aspirin) .... Take 1 tablet by mouth once a day 6)  Cyanocobalamin 1000 Mcg/ml Soln (Cyanocobalamin) .... Im injection once monthly 7)  Bystolic 5 Mg Tabs (Nebivolol hcl) .Marland Kitchen.. 1 by mouth once daily 8)  Vitamin B 12 Shot Q M  9)  Fenofibrate Micronized 134 Mg Caps (Fenofibrate micronized) .Marland Kitchen.. 1 by mouth qd 10)  Ocuvite-lutein Caps (Multiple vitamins-minerals) .Marland Kitchen.. 1 by mouth once daily   Orders Added: 1)  Est. Patient Level IV [04540]

## 2011-02-04 NOTE — Miscellaneous (Signed)
Summary: HOSPITAL ADMISSION   Clinical Lists Changes  Observations: Added new observation of NEURO EXAM: no focal neurologic deficits (01/28/2011 17:22) Added new observation of MSK EXAM: LEFT hand and distal forearm is s2wollen, not ed. Itis TTP (from swelling I think). radial pulse normal 2+.  LEFT knee is red, warm and TTP (01/28/2011 17:22) Added new observation of PEADULT: Denny Levy MD ~General`Gen appear ~Msk`MSK EXAM ~Neurologic`Neuro exam (01/28/2011 17:22) Added new observation of GEN APPEAR: alert, well-developed, well-nourished, and well-hydrated.   (01/28/2011 17:22) Added new observation of HPI: [name] was recently in hospital for SBO and was doing well from that standopoint. Yesterday he started having some left wrist and hand swelling, got much worse today. Also today he notes red warm painful right knee. Was onhis way to ED when I saw him in triage area. (01/28/2011 17:22) Added new observation of PRIMARY MD: Denny Levy, MD (01/28/2011 17:22)      Impression & Recommendations: Dub Amis Karsten Ro swelling--concern for DVT. will admit and try to get US--if not available will heparize w gtt tonight and [place] in am 2) KNEE--looks like septic joint. Will get blood cultures, start broad spectrum abx  I have seen and examined [name] in the ED triage area, discussed with Drs Tawni Pummel and Lula Olszewski. we agree to the above plan for admission. They plan more complete evaluation once he reaches the floor bed and we will complete plan then. please see their H&P for further and more complete details.  Complete Medication List: 1)  Exforge 10-160 Mg Tabs (Amlodipine besylate-valsartan) .Marland Kitchen.. 1 by mouth once daily 2)  Protonix 40 Mg Tbec (Pantoprazole sodium) .... Take 1 tablet by mouth once a day 3)  Hydrochlorothiazide 25 Mg Tabs (Hydrochlorothiazide) .... Take one tablet by mouth every other day 4)  Altace 5 Mg Caps (Ramipril) .Marland Kitchen.. 1 by mouth once daily generic preferred 5)  Bayer Childrens Aspirin  81 Mg Chew (Aspirin) .... Take 1 tablet by mouth once a day 6)  Cyanocobalamin 1000 Mcg/ml Soln (Cyanocobalamin) .... Im injection once monthly 7)  Bystolic 5 Mg Tabs (Nebivolol hcl) .Marland Kitchen.. 1 by mouth once daily 8)  Vitamin B 12 Shot Q M  9)  Fenofibrate Micronized 134 Mg Caps (Fenofibrate micronized) .Marland Kitchen.. 1 by mouth qd 10)  Ocuvite-lutein Caps (Multiple vitamins-minerals) .Marland Kitchen.. 1 by mouth once daily   Primary Care Provider:  Denny Levy, MD   History of Present Illness: [name] was recently in hospital for SBO and was doing well from that standopoint. Yesterday he started having some left wrist and hand swelling, got much worse today. Also today he notes red warm painful right knee. Was onhis way to ED when I saw him in triage area.   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 Wife Thomasenia Sales) deceased Apr 22, 2007. retired.  2 cats, gardens. ; Prior 40pack-yr tob hx, quit 32yrs ago.  Prior social etoh, none since 38.  Independent ADLs.  Exercise bike daily.  ; Family in area=stepson John in [place], brother in [place], stepdaughter in [place], stepdaughter in [place].   granddaughter has Hodgkins lyumphoma 04-21-2008.   Physical Exam  General:  alert, well-developed, well-nourished, and well-hydrated.   Msk:  LEFT hand and distal forearm is s2wollen, not ed. Itis TTP (from swelling I think). radial pulse normal 2+.  LEFT knee is red, warm and TTP Neurologic:  no focal neurologic deficits

## 2011-02-05 ENCOUNTER — Encounter: Payer: Self-pay | Admitting: Family Medicine

## 2011-02-05 ENCOUNTER — Ambulatory Visit (INDEPENDENT_AMBULATORY_CARE_PROVIDER_SITE_OTHER): Payer: Medicare Other | Admitting: Family Medicine

## 2011-02-05 VITALS — BP 154/76 | HR 73 | Temp 97.8°F | Ht 67.0 in | Wt 184.0 lb

## 2011-02-05 DIAGNOSIS — I10 Essential (primary) hypertension: Secondary | ICD-10-CM

## 2011-02-05 DIAGNOSIS — N289 Disorder of kidney and ureter, unspecified: Secondary | ICD-10-CM

## 2011-02-05 DIAGNOSIS — R1310 Dysphagia, unspecified: Secondary | ICD-10-CM

## 2011-02-05 NOTE — Progress Notes (Signed)
  Subjective:    Patient ID: [name], male    DOB: 13-Aug-1920, 77 y.o.   MRN: [id number]  HPI F/u 2 recent hospitalizations 1) SBO: no more emesis or nausea--has had a little constipation Appetite back to normal. Energy not quite back to normal yet 2) 2nd hospitalization for wrist swelling and pain and what we thought was a cellulitis of wrist and knee. Both have improved--knee is totally better. Wrist still a little stiff and pinkie finger still stiff and swollen more than usual but all in all he is OK w current status, Does not want injection.  3) has follow up with his cardiologist tomorrow--he had blamed his rising creatiniine on his fenofibrate so we had done a trial of stopping that--then he ended up in hosp and had some ARF assoc with his SBO--he does not wantto retsart fenofibrate. 4) having some "sticking" sensation of food in his posterio pharynx   Review of Systems  Constitutional: Positive for fatigue. Negative for fever and chills.  Respiratory: Positive for choking. Negative for chest tightness.   Gastrointestinal: Positive for constipation. Negative for nausea and abdominal pain.  Genitourinary: Negative for difficulty urinating.  Musculoskeletal: Positive for arthralgias. Negative for joint swelling.  Neurological: Negative for dizziness.       Objective:   Physical Exam  Constitutional: He appears well-developed and well-nourished.  Eyes: Conjunctivae are normal.  Neck: Normal range of motion. Neck supple. No thyromegaly present.  Cardiovascular: Regular rhythm, S1 normal, S2 normal and intact distal pulses.  Bradycardia present.   Pulmonary/Chest: He has no wheezes. He has no rales.  Musculoskeletal:       LEFT WRIST mildly painful with resisted extension and flexion but much improved. No swelling or effisuion. The left 5th finger is somewhat swollen diffusely. Normal grip strength  Left knee is normal--no erythema or effusion. Full extension and flexion           Assessment & Plan:

## 2011-02-05 NOTE — Discharge Summary (Signed)
NAME:  [name], HENRICHS NO.:  [id number]  MEDICAL RECORD NO.:  [id number]           PATIENT TYPE:  E  LOCATION:  [facility]                         FACILITY:  [facility]  PHYSICIAN:  Dirck Butch A. Sheffield Slider, M.D.    DATE OF BIRTH:  24-Jul-1920  DATE OF ADMISSION:  01/21/2011 DATE OF DISCHARGE:  01/25/2011                              DISCHARGE SUMMARY   PRIMARY CARE PHYSICIAN:  Dr. Denny Levy at [facility].  DISCHARGE DIAGNOSES: 1. Small bowel obstruction. 2. Pneumonia. 3. Hypertension. 4. Gastroesophageal reflux disease. 5. Acute on chronic renal failure. 6. Syncopal episode. 7. B12 deficiency. 8. Bilateral cataracts. 9. Sick sinus syndrome status post pacemaker. 10.Allergic rhinitis. 11.Esophageal stricture. 12.Hiatal hernia. 13.Diverticulosis. 14.Benign prostatic hyperplasia. 15.Hip pain. 16.History of colonic polyps.  DISCHARGE MEDICATIONS:  He is to stop the following medications: 1. Exforge. 2. Ramipril.  NEW MEDICATIONS: 1. Azithromycin 500 mg 1 tablet p.o. daily, take for one additional     day. 2. Florastor 250 mg p.o. daily.  He is to continue the following medications: 1. Aspirin 81 mg p.o. daily. 2. Bystolic 5 mg p.o. daily. 3. Hydrochlorothiazide 25 mg 1 tablet p.o. daily. 4. Protonix 1 tablet p.o. daily. 5. Vitamin B12 injections monthly.  CONSULTS:  General Surgery.  PROCEDURES AND IMAGING:  He had an abdominal 2-view on January 21, 2011, impression: 1. Significant stool with a nondilated loops of colon. 2. Dilated central small bowel loops containing air-fluid levels.     Findings consistent with small bowel obstruction either partial or     early.  Also, concern regarding bowel wall thickening.  Further     evaluation of CT of the abdomen and pelvis with intravenous and     oral contrast may be helpful.  He had a CT of abdomen on January 21, 2011, impression: 1. Partial/low-grade small bowel obstruction pattern.  Small  bowel     loops are angulated suggesting the obstruction may be due to     adhesions.  No mass lesion or focal inflammatory changes seen.     There is a relative transition point in the right aspect of the     pelvis. 2. Nasogastric tube terminated in the distal esophagus. 3. Patchy airspace disease in the left lower lobe consistent with     aspiration or pneumonia. 4. Diverticulosis in the sigmoid. 5. Atherosclerotic disease of the abdominal aorta with focal     infrarenal abdominal aortic ectasia negative for aneurysm.  He had a 2-view of the abdomen on January 23, 2011, impression, findings may consistent with partial small bowel obstruction with 42-mm loop in the left ventral abdomen.  He had an echo, impression, LVEF of 60%-65%, normal wall motion. Doppler parameters consistent with grade 2 diastolic dysfunction.  There was mild mitral regurgitation, moderate left atrial enlargement, PA peak pressure was 41 mmHg.  LABORATORY DATA:  At the time of admission, CBC with WBCs of 14.9, hemoglobin 13.7, hematocrit of 40.7, and platelets of 271.  Chemistry with sodium 134, potassium 4.3, chloride 99, bicarb of 24, creatinine is 2.46, BUN of 28.  Troponin 0.02.  Cardiac  enzymes were negative x2.  At the time of discharge, CBC, WBCs were 6.1, hemoglobin 10.4, hematocrit 31, and platelets of 174.  Sodium 137, potassium 3.9, chloride 109, bicarb 22, BUN of 18, creatinine 1.60, and glucose 115.  BRIEF HOSPITAL COURSE:  This is an 77 year old male with history of small bowel obstruction and remote history of appendectomy, here with another small bowel obstruction thought to be secondary to adhesions. 1. Small bowel obstruction.  The patient was admitted to floor and NG     tube was continued with low intermittent suction.  The patient was     hydrated with intravenous fluids.  Surgery was consulted during the     admission and followed along throughout the entire admission and     gave  recommendations.  Initially, the patient was kept n.p.o.     except for meds.  Throughout his hospital course, he improved and     was able to have his NG tube clamped.  He tolerated having his NG     tube clamped without nausea or vomiting.  The following day, his NG     tube was removed and he was advanced to clear liquid diet.  He     tolerated clear liquid diet well and was advanced as tolerated from     that point.  He experienced no additional nausea, vomiting, or     bloating.  He had regular bowel movements throughout the admission     with one large bowel movement after Fleet enema prior to date of     discharge. 2. Pneumonia.  The patient initially started on azithromycin and     ceftriaxone to treat for community-acquired pneumonia.  He did have     elevated CBC upon admission.  This trended down throughout the     admission.  The patient was relatively asymptomatic during the     admission.  Did have one episode where he required 3 liters of     oxygen, although, this was thought to be due to fluid overload. 3. Acute on chronic renal failure.  Looking back at prior office note,     looks like his creatinine has slowly been trending up over the past     few months, however, when he came in to the hospital this time,     creatinine was significantly elevated at 2.46, this was thought to     be likely secondary to his vomiting and dehydration.  With     hydration, his creatinine improved and at the time of discharge it     was 1.6.  During the admission, his nephrotoxic medications     including Exforge as well as ramipril were held.  We will defer to     PCP to add these back on as needed. 4. Syncopal episode.  The patient did have syncopal episode after     vomiting in the emergency department.  Cardiac enzymes were cycled,     which were negative.  We did get a repeat echocardiogram to rule     out any cardiogenic causes.  Also, he did have some symptoms of     fluid overload  during the admission and required 3 liters of     oxygen, at one point this improved with 20 mg of Lasix.  He     experienced no further symptoms of lightheadedness, dizziness, or     feelings of passing out during the hospitalization. 5. Hypertension.  The patient  was continued on his Bystolic and     hydrochlorothiazide during the admission.  His ramipril and Exforge     were held due to his increased creatinine. 6. Hand swelling and pain.  On the day of discharge, the patient did     have hand swelling and pain in his left hand.  The patient stated     that this did not feel like gout attack that he previously had.     Area was not warm or reddened.  His IV had infiltrated prior and it     was felt that this was cause of the swelling in his hand.     Recommend close follow-up of this at the [facility]     with a nurse to visit.  Flag has been sent to call the patient and     schedule an appointment either the day after or 2 days after his     discharge. 7. Gastroesophageal reflux disease/gastritis.  He has continued on     Protonix per his home regimen.  DISCHARGE INSTRUCTIONS:  The patient was discharged with instructions and no restrictions in activity.  He is to follow a low-sodium heart- healthy diet.  He is to avoid any straining and to stop any activity that causes chest pain, shortness of breath, dizziness, sweating, or excessive weakness.  He was instructed that if he had increased nausea, vomiting, or abdominal distention that he should call [facility] and go to the ER.  It was explained to him that we had stopped some of his blood pressure medicines and we will defer to Dr. Jennette Kettle to add these back on as needed at his follow-up appointment.  FOLLOWUP:  He is to follow up with Dr. Jennette Kettle at the [facility].  He was given a number to call and make an appointment and told to schedule within the next 1 to 2 weeks.  I would like for him to follow  up in [facility] on Monday or Tuesday after discharge to have a nurse visit to look in his wrist, ensure this is a worsening, and followup his abdominal distention, nausea, and vomiting.  A flag was sent through [facility] for this appointment to made.  DISCHARGE CONDITION:  The patient is discharged home in stable medical condition.    ______________________________ Everrett Coombe, MD   ______________________________ Arnette Norris. Sheffield Slider, M.D.    CM/MEDQ  D:  01/27/2011  T:  01/28/2011  Job:  [id number]  cc:   Nestor Ramp, MD  Electronically Signed by Everrett Coombe MD on 02/04/2011 02:33:17 PM Electronically Signed by Zachery Dauer M.D. on 02/05/2011 04:24:41 PM

## 2011-02-05 NOTE — Patient Instructions (Signed)
Great to see you! Glad you are back on your feet. Please tell Dr Lynnea Ferrier tthe following: 1) recently hospitalized for SBO and pneumonia--resolved. 2) re-hospitalized with swollen left hand and knee--???maybe gout, maybe injury--whichever it resolved as well. 3) has had some significant increase inhis creatinine--I faxed you a note earlier in the month about that--he thought it was related to his cholesterol meds--which we stopped. During hi recent hospital stay he had ARF on CRI and we ended up stopping his Ramipril and Exforge. In my office today his BP is 154 /76 so I an restarting his altace. He seems to be doing very well. Just wanted to update you

## 2011-02-05 NOTE — Assessment & Plan Note (Signed)
He has traditionally done a little better with his BP in the 120-140 range as he had a lot of dizziness when we dropped it below that last year. Extremely functional for his age!

## 2011-02-05 NOTE — Consult Note (Signed)
  NAME:  [name], [name] NO.:  [id number]  MEDICAL RECORD NO.:  [id number]           PATIENT TYPE:  E  LOCATION:  [facility]                         FACILITY:  [facility]  PHYSICIAN:  Gabrielle Dare. Janee Morn, M.D.DATE OF BIRTH:  Oct 28, 1920  DATE OF CONSULTATION: DATE OF DISCHARGE:                                CONSULTATION   CHIEF COMPLAINT:  Nausea and vomiting and abdominal pain.  HISTORY OF PRESENT ILLNESS:  [name] is an 77 year old white male who developed nausea and vomiting around 9:00 a.m. on January 21, 2011.  It worsened and he developed a syncopal type episode and he came to the emergency department for evaluation.  Once here, he had increased amounts of nausea and vomiting.  Workup was done including x-rays and CT scan of the abdomen and pelvis showing partial small-bowel obstruction. The patient had abdominal pain associated with this earlier but that has resolved.  PAST MEDICAL HISTORY:  GERD, hypercholesterolemia, hypertension, pacemaker, and small-bowel obstruction in the past.  PAST SURGICAL HISTORY:  Laparoscopic lysis of adhesions for small-bowel obstruction in 2006 by Dr. Ovidio Kin.  He has also had appendectomy and left hip arthroplasty.  SOCIAL HISTORY:  Does not smoke or drink alcohol.  ALLERGIES:  CODEINE.  MEDICATIONS:  Bystolic, Protonix, hydrochlorothiazide, Exforge, aspirin, and ramipril.  REVIEW OF SYSTEMS:  GI:  Nausea and vomiting and abdominal pain as described above.  Currently his pain has resolved.  Remainder of the system review is unremarkable.  PHYSICAL EXAMINATION:  VITAL SIGNS:  Temperature 98.2, blood pressure 115/58, heart rate 67, respirations 15. GENERAL:  He is awake and alert. HEENT:  He has a nasogastric tube in place with orange output. NECK:  Supple with no tenderness. LUNGS:  Clear to auscultation. HEART:  Regular with no murmurs.  Impulses palpable in the left chest. ABDOMEN:  Soft.  There is mild distention.   There is no significant tenderness present.  A few bowel sounds are heard but they are hypoactive.  No masses are felt.  EXTREMITIES:  Have no significant edema.  LABORATORY STUDIES:  Sodium 134, potassium 4.3, chloride 99, CO2 24, BUN 28, creatinine 2.46, glucose 164, white blood cell count 14.9, hemoglobin 13.7.  IMPRESSION:  Partial small-bowel obstruction and renal insufficiency which is possibly acute on chronic.  RECOMMENDATIONS:  I agree with medical admission.  Recommend NG tube decompression and IV fluid resuscitation and we will follow along with you.  If this small bowel obstruction is likely due to adhesions, hopefully things will open up with conservative management, otherwise he may need to proceed with surgery.  Plan was discussed in detail with the patient and his family member.     Gabrielle Dare Janee Morn, M.D.     BET/MEDQ  D:  01/22/2011  T:  01/22/2011  Job:  [id number]  cc:   Tinnie Gens P. Weldon Inches, MD Beryl Meager  Electronically Signed by Violeta Gelinas M.D. on 02/05/2011 04:47:59 PM

## 2011-02-05 NOTE — Assessment & Plan Note (Signed)
Recommend he call Dr Jarold Motto re this--does not sound like his typical recurrence of esophageal stricture.

## 2011-02-05 NOTE — Assessment & Plan Note (Signed)
Creatinine had increased to > 2 while in hospital and now back to 1.5 baseline. I doubt it was the fenofibrate (bur he thinks it was related to that ad does not want to retsart). I have restarted his ACE as I do believe he needs that---unclear to me if he needs the exforge (ccb/arb) and will leave that to Dr Lynnea Ferrier. Plan to keep close eye on his kidney function.

## 2011-02-06 LAB — BASIC METABOLIC PANEL
BUN: 25 mg/dL — ABNORMAL HIGH (ref 6–23)
CO2: 25 mEq/L (ref 19–32)
Calcium: 9 mg/dL (ref 8.4–10.5)
Chloride: 99 mEq/L (ref 96–112)
Creat: 1.35 mg/dL (ref 0.40–1.50)
Glucose, Bld: 155 mg/dL — ABNORMAL HIGH (ref 70–99)
Potassium: 5.3 mEq/L (ref 3.5–5.3)
Sodium: 135 mEq/L (ref 135–145)

## 2011-02-10 ENCOUNTER — Ambulatory Visit (INDEPENDENT_AMBULATORY_CARE_PROVIDER_SITE_OTHER): Payer: Medicare Other | Admitting: Family Medicine

## 2011-02-10 ENCOUNTER — Encounter: Payer: Self-pay | Admitting: Family Medicine

## 2011-02-10 VITALS — BP 156/78 | HR 88 | Ht 66.0 in | Wt 183.0 lb

## 2011-02-10 DIAGNOSIS — N481 Balanitis: Secondary | ICD-10-CM

## 2011-02-10 DIAGNOSIS — N390 Urinary tract infection, site not specified: Secondary | ICD-10-CM

## 2011-02-10 DIAGNOSIS — N476 Balanoposthitis: Secondary | ICD-10-CM

## 2011-02-10 DIAGNOSIS — N400 Enlarged prostate without lower urinary tract symptoms: Secondary | ICD-10-CM

## 2011-02-10 DIAGNOSIS — N401 Enlarged prostate with lower urinary tract symptoms: Secondary | ICD-10-CM

## 2011-02-10 DIAGNOSIS — N189 Chronic kidney disease, unspecified: Secondary | ICD-10-CM

## 2011-02-10 DIAGNOSIS — R300 Dysuria: Secondary | ICD-10-CM | POA: Insufficient documentation

## 2011-02-10 LAB — POCT URINALYSIS DIPSTICK
Bilirubin, UA: NEGATIVE
Ketones, UA: NEGATIVE
Leukocytes, UA: NEGATIVE
Nitrite, UA: NEGATIVE
Protein, UA: NEGATIVE
pH, UA: 6.5

## 2011-02-10 MED ORDER — CEPHALEXIN 500 MG PO CAPS: 500.0000 mg | ORAL_CAPSULE | Freq: Two times a day (BID) | ORAL | Status: DC

## 2011-02-10 MED ORDER — CLOTRIMAZOLE 1 % EX CREA: TOPICAL_CREAM | CUTANEOUS | Status: DC

## 2011-02-10 MED ORDER — DOXAZOSIN MESYLATE 1 MG PO TABS: 1.0000 mg | ORAL_TABLET | Freq: Every day | ORAL | Status: DC

## 2011-02-10 NOTE — Patient Instructions (Signed)
Benign Prostatic Hyperplasia You have an enlarged prostate. This is common in elderly males. It is called BPH. This stands for benign prostate hyperplasia. The prostate gland is located in base of the bladder. When it grows, the prostate blocks the urethra. This is the tube which drains urine from the bladder.  SYMPTOMS OF BPH INCLUDE:  Weak urine stream.  Dribbling.   Feeling like the bladder has not emptied completely.   Difficulty starting urination.  Getting up frequently at night to urinate.   Urinating more frequently during the day.   Complete urinary blockage or severe pain with urination requires immediate attention. DIAGNOSIS  Your caregiver often has a good idea what is wrong by taking a history and doing a physical exam.   Special x-rays may be done.  TREATMENT  For mild problems, no treatment may be necessary.   If the problems are moderate, medications may provide relief. Some of these work by making the prostate gland smaller. The herb saw palmetto is commonly used.   If complete blockage occurs, a Foley catheter is usually left in place for a few days.   Surgery is often needed for more severe problems. TURP is the prostate surgery for BPH which is done through the urethra. TURP stands for transurethral resection of the prostate. It involves cutting away chips from the prostate. It is done by removing chips so that they can come out through the penis.   Techniques using heat, microwave and laser to remove the prostate blockage are also being used.  HOME CARE INSTRUCTIONS  Give yourself time when you urinate.   Stay away from alcohol.   Beverages containing caffeine such as coffee, tea and colas can make the problems worse.   Decongestants, antihistamines, and some prescription medicines can also make the problem worse.   Follow up with your caregiver for further treatment as recommended.  SEEK IMMEDIATE MEDICAL CARE IF:  You develop increased pain with  urination or are unable to pass your water.   You develop severe abdominal pain, vomiting, a high fever, or fainting.   You develop back pain or blood in your urine.  MAKE SURE YOU:   Understand these instructions.   Will watch your condition.   Will get help right away if you are not doing well or get worse.  Document Released: 11/03/2005 Document Re-Released: 01/28/2010 [facility] Patient Information 2011 [place], [place].Balanitis Balanitis is an common infection of the head (glans) of the penis. CAUSES Balanitis has multiple causes. Frequently balanitis is the result of poor personal hygiene. Especially if no circumcision has been done. Without adequate washing, many different kinds of germs (viruses, bacteria, and yeast) collect between the foreskin and the glans. This can cause an infection. Lack of air and irritation from a normal secretion called smegma contribute to the cause in uncircumcised males. Other causes include chemical irritation by certain soaps (especially soaps with perfumes). When no circumcision has been done, a frequent cause of poor hygiene is that the tip of the foreskin is tight (phimosis) and cannot be pulled back for adequate washing. Illnesses in other areas of the body can also cause Balanitis. This includes illnesses that cause water retention and swelling, such as:  Heart failure.   Cirrhosis of the liver.   Kidney problems.  Other contributing causes include:  Obesity.   Certain allergies to drugs such as tetracycline and sulfa.   Diabetes.  SYMPTOMS Symptoms may include:  Discharge coming from under the foreskin.   Tenderness.  Itching and inability to get an erection (because of the pain).   Redness and a rash is frequently seen.   If the problem remains for a while, sores can be seen on the glans and on the foreskin.  If the condition is not treated other complications such as a scar of the opening to the urethra (tube that carries the  urine out from the bladder) can occur and block the bladder. This narrowing is called meatal stenosis. Other problems can occur such as:  Infection of the lymph nodes in the crease of the groin.   Ballooning of the foreskin when voiding (when the foreskin opening has scarred down and been made smaller).   Blockage of the bladder.   Frequent urinary infections occur in children with balanitis.  HOME CARE INSTRUCTIONS  Pull back foreskin to urinate and when washing.   Pull back foreskin when putting medicine on the affected area to prevent the foreskin from swelling and being trapped behind the head.   Keep foreskin and glans clean and dry.   Sitz baths may be helpful.   Use Antibiotics as directed - anti fungus medication (depends on cause).   Pain medication, if needed.   Circumcision (may be recommended).  SEEK IMMEDIATE MEDICAL CARE IF:  The affected area becomes trapped behind the head.   You start a fever.   The swelling increases.  Document Released: 03/22/2009 Document Re-Released: 01/28/2010 [facility] Patient Information 2011 [place], [place].

## 2011-02-11 ENCOUNTER — Telehealth: Payer: Self-pay | Admitting: Family Medicine

## 2011-02-11 ENCOUNTER — Other Ambulatory Visit (INDEPENDENT_AMBULATORY_CARE_PROVIDER_SITE_OTHER): Payer: Medicare Other | Admitting: Family Medicine

## 2011-02-11 DIAGNOSIS — N400 Enlarged prostate without lower urinary tract symptoms: Secondary | ICD-10-CM

## 2011-02-11 DIAGNOSIS — R300 Dysuria: Secondary | ICD-10-CM

## 2011-02-11 DIAGNOSIS — N481 Balanitis: Secondary | ICD-10-CM

## 2011-02-11 DIAGNOSIS — N476 Balanoposthitis: Secondary | ICD-10-CM

## 2011-02-11 LAB — BASIC METABOLIC PANEL
BUN: 22 mg/dL (ref 6–23)
Chloride: 98 mEq/L (ref 96–112)
Creat: 1.36 mg/dL (ref 0.40–1.50)
Glucose, Bld: 111 mg/dL — ABNORMAL HIGH (ref 70–99)
Potassium: 5.2 mEq/L (ref 3.5–5.3)

## 2011-02-11 MED ORDER — CLOTRIMAZOLE 1 % EX CREA: TOPICAL_CREAM | CUTANEOUS | Status: DC

## 2011-02-11 MED ORDER — TAMSULOSIN HCL 0.4 MG PO CAPS: 0.4000 mg | ORAL_CAPSULE | ORAL | Status: DC

## 2011-02-11 MED ORDER — CEPHALEXIN 500 MG PO CAPS: 500.0000 mg | ORAL_CAPSULE | Freq: Two times a day (BID) | ORAL | Status: AC

## 2011-02-11 NOTE — Progress Notes (Signed)
  Subjective:    Patient ID: [name], male    DOB: [birth year], 77 y.o.   MRN: [id number]  HPI 62 YOM with PMhx/o BPH w/ obstruction with 1-2 weeks history of dysuria and increased urinary frequency. No fever, nausea, abd pain, flank pain. No hematuria. Pt also reports persistent increased urinary frequency.Pt states that he urinates at baseline q2 hours. This has been stable per pt. Pt wears diapers for unrinary incontinence. Also reports redness around penile and testicular area. Pt does report any urine on red area causes some pain.   Review of Systems See HPI    Objective:   Physical Exam Gen: up in chair, NAD CV: RRR, no murmurs auscultated PULM: CTAB, no wheezes, rales, rhoncii ABD: Obese/S/NT/+ bowel sounds, no CVA tenderness GU: + redness on phallus, meatus and testes, no penile discharge, LAD EXT: 2+ peripheral pulses    Assessment & Plan:  Dysuria- Plan to treat pt w/ extended course of keflex. Urine culture. No clinical signs of upper UTI. Will follow up in 1 week. Balanitis- Plan to treat with topical lotrimin. Also addressed importance of barrier protection and increasing diaper changes.  BPH-Will start pt on cardura for concominant BPH treatment and peripheral vasodilatory effects for BP.

## 2011-02-11 NOTE — Telephone Encounter (Signed)
Pt wants to know what pharmacy we sent his meds to yesterday? He says they are not at his pharmacy ([facility])

## 2011-02-12 ENCOUNTER — Encounter: Payer: Medicare Other | Admitting: Family Medicine

## 2011-02-12 LAB — CULTURE, URINE COMPREHENSIVE: Colony Count: NO GROWTH

## 2011-02-13 ENCOUNTER — Inpatient Hospital Stay: Payer: Medicare Other

## 2011-02-16 DIAGNOSIS — N481 Balanitis: Secondary | ICD-10-CM | POA: Insufficient documentation

## 2011-02-16 DIAGNOSIS — N390 Urinary tract infection, site not specified: Secondary | ICD-10-CM | POA: Insufficient documentation

## 2011-02-16 NOTE — Assessment & Plan Note (Signed)
Plan to treat pt w/ extended course of keflex. Urine culture. No clinical signs of upper UTI. Will follow up in 1 week.

## 2011-02-16 NOTE — Assessment & Plan Note (Signed)
Will start pt on cardura for concominant BPH treatment and peripheral vasodilatory effects for BP

## 2011-02-16 NOTE — Assessment & Plan Note (Signed)
Plan to treat with topical lotrimin. Also addressed importance of barrier protection and increasing diaper changes

## 2011-02-17 ENCOUNTER — Encounter: Payer: Self-pay | Admitting: Family Medicine

## 2011-02-17 ENCOUNTER — Ambulatory Visit (INDEPENDENT_AMBULATORY_CARE_PROVIDER_SITE_OTHER): Payer: Medicare Other | Admitting: Family Medicine

## 2011-02-17 DIAGNOSIS — N401 Enlarged prostate with lower urinary tract symptoms: Secondary | ICD-10-CM

## 2011-02-17 DIAGNOSIS — N400 Enlarged prostate without lower urinary tract symptoms: Secondary | ICD-10-CM

## 2011-02-17 DIAGNOSIS — I10 Essential (primary) hypertension: Secondary | ICD-10-CM

## 2011-02-17 DIAGNOSIS — N476 Balanoposthitis: Secondary | ICD-10-CM

## 2011-02-17 DIAGNOSIS — N390 Urinary tract infection, site not specified: Secondary | ICD-10-CM

## 2011-02-17 DIAGNOSIS — N481 Balanitis: Secondary | ICD-10-CM

## 2011-02-17 MED ORDER — DOXAZOSIN MESYLATE 1 MG PO TABS: 1.0000 mg | ORAL_TABLET | Freq: Every day | ORAL | Status: DC

## 2011-02-17 NOTE — Assessment & Plan Note (Addendum)
day 7/10 of keflex. Tolerating well. Urine cx negative. Clinically improving. Will finish abx course.

## 2011-02-17 NOTE — Progress Notes (Signed)
  Subjective:    Patient ID: [name], male    DOB: [birth year], 77 y.o.   MRN: [id number]  HPI Pt here for follow up on UTI, balanitis, BPH.  Pt reports marked resolution in penile and scrotal redness. Also with resolution in dysuria. Still some increased urinary frequency. No abd pain, nausea, fever per pt. No rash.  BPH- Pt states that he was unable to get cardura medication. Was rx'd flomax instead. As stated above, still with some increased urinary frequency, though sxs not worsened.   Review of Systems See HPI     Objective:   Physical Exam Gen: up in chair, NAD  CV: RRR, no murmurs auscultated  PULM: CTAB, no wheezes, rales, rhoncii  ABD: Obese/S/NT/+ bowel sounds, no CVA tenderness  GU: minimal redness on phallus, meatus and testes, no penile discharge, LAD  EXT: 2+ peripheral pulses      Assessment & Plan:  UTI- day 7/10 of keflex. Tolerating well. Urine cx negative. Clinically improving. Will finish abx course.  Balanitis- Clinically resolved. Instructed pt to finish 2 weeks course of medication. Also encouraged frequent diaper changing and barrier cream application.  BPH- Will start back on Cardura for concominant BPH and HTN management. Discussed orthostatic sxs at length. Pt w/ tentative follow up with Dr. Jennette Kettle 4/11.

## 2011-02-17 NOTE — Patient Instructions (Signed)
STOP the flomax START the cardura; If you have any weakness or dizziness after taking the cardura, please give [place] a call and stop taking this medication.   Make sure to take your blood pressures every day Follow up with Dr. Jennette Kettle on the 11th Please call if you have any questions or concerns.

## 2011-02-17 NOTE — Assessment & Plan Note (Signed)
Clinically resolved. Instructed pt to finish 2 weeks course of medication. Also encouraged frequent diaper changing and barrier cream application.

## 2011-02-17 NOTE — Assessment & Plan Note (Signed)
Will start back on Cardura for concominant BPH and HTN management. Discussed orthostatic sxs at length. Pt w/ tentative follow up with Dr. Jennette Kettle 4/11.

## 2011-02-17 NOTE — Progress Notes (Addendum)
  Subjective:    Patient ID: [name], male    DOB: 03-07-1920, 77 y.o.   MRN: [id number]  HPI    Review of Systems     Objective:   Physical Exam        Assessment & Plan:  Discussed medications with Dr. Jennette Kettle. Dr. Jennette Kettle desires to avoid cardura 2/2 to peripheral alpha effects to avoid weakness, hypotension. Left VM on pt's phone as well as pharmacy to d/c cardura and to continue with flomax.

## 2011-02-26 ENCOUNTER — Encounter: Payer: Self-pay | Admitting: Family Medicine

## 2011-02-26 ENCOUNTER — Ambulatory Visit (INDEPENDENT_AMBULATORY_CARE_PROVIDER_SITE_OTHER): Payer: Medicare Other | Admitting: Family Medicine

## 2011-02-26 VITALS — BP 153/77 | HR 76 | Temp 97.4°F | Ht 67.0 in | Wt 185.0 lb

## 2011-02-26 DIAGNOSIS — I10 Essential (primary) hypertension: Secondary | ICD-10-CM

## 2011-02-26 DIAGNOSIS — N401 Enlarged prostate with lower urinary tract symptoms: Secondary | ICD-10-CM

## 2011-02-26 DIAGNOSIS — D518 Other vitamin B12 deficiency anemias: Secondary | ICD-10-CM

## 2011-02-26 MED ORDER — CYANOCOBALAMIN 1000 MCG/ML IJ SOLN
1000.0000 ug | Freq: Once | INTRAMUSCULAR | Status: AC
  Administered 2011-02-26: 1000 ug via INTRAMUSCULAR

## 2011-02-26 MED ORDER — CYANOCOBALAMIN 1000 MCG/ML IJ SOLN: 1000.0000 ug | Freq: Once | INTRAMUSCULAR | Status: DC

## 2011-02-26 MED ORDER — TRAMADOL HCL 50 MG PO TABS: 50.0000 mg | ORAL_TABLET | Freq: Three times a day (TID) | ORAL | Status: DC | PRN

## 2011-02-26 NOTE — Assessment & Plan Note (Signed)
B12 inj today 

## 2011-02-26 NOTE — Progress Notes (Signed)
  Subjective:    Patient ID: [name], male    DOB: Jan 17, 1920, 77 y.o.   MRN: [id number]  HPI  [name] is here for followup of urinary frequency which has mostly resolved. He is still having some hesitancy but thinks the Flomax has improved some. He was not sure if he should stay on the Flomax for now. The rash on his penis has resolved.  Continues to have left wrist pain. This is becoming fairly bothersome to him. He wonders if it is arthritis. The pain in his left knee has totally resolved.  He uses one or 2 tramadol today for his arthralgias and this helps him quite a bit. He would like a refill.  He is here also for his B12 shot.  He says he has good days and bad days. Today is actually one of the best days he sat for a long time. His energy level is good.  Followup hypertension. His blood pressure diary reveals systolics in the 127 2 151 range with diastolics as low as 60 and as high as 88. His pulse varies from 50-85.  Review of Systems    denies shortness of breath beyond his baseline. He has not had any chest pains. Appetite and weight remain stable. He is sleeping fairly well. His mood is stable. He denies episodes of depression Objective:   Physical Exam    GENERALl: Well developed, well nourished, in no acute distress. NECK: Supple, FROM, without lymphadenopathy.  THYROID: normal without nodularity CAROTID ARTERIES: without bruits LUNGS: clear to auscultation bilaterally. No wheezes or rales. HEART: Regular rate and rhythm, no murmurs ABDOMEN: soft with positive bowel sounds NEURO: No gross focal deficits  LEFT wrist:mildy ttp dorsum, some crepitus. Distally NV intact      Assessment & Plan:  #1 hypertension with known coronary disease: I am very happy with his blood pressure in this range. I think he does best when his systolics are less than 160 but he seems to get lightheaded when they are much below 1:30. I think we will continue the current medication regimen  unchanged. The last check on his renal function was much improved I don't think we need to recheck that today I did plan on seeing him back in 4-6 weeks for regular followup and certainly in the interim.  #2 urinary hesitancy: BPH colon I will continue the Flomax at 0.4 mg daily we'll see how he does with this since he is getting some improvement.  #3: Arthralgias and other pains or any well controlled by inhalers tramadol. I would continue that and I have given him a refill today.  #4. 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 I don't know why didn't bother him more before the hospitalization. Perhaps something in that inflammatory reaction he had in his hand his 30s. We decided on injection therapy today. We'll see how that does.

## 2011-02-26 NOTE — Assessment & Plan Note (Signed)
Started flomax

## 2011-02-28 NOTE — Discharge Summary (Signed)
NAME:  [name], [name] NO.:  [id number]  MEDICAL RECORD NO.:  [id number]           PATIENT TYPE:  I  LOCATION:  5008                         FACILITY:  [facility]  PHYSICIAN:  Nestor Ramp, MD        DATE OF BIRTH:  [birth year]  DATE OF ADMISSION:  01/28/2011 DATE OF DISCHARGE:  01/31/2011                              DISCHARGE SUMMARY   PRIMARY CARE PROVIDER:  Huntley Dec L. Jennette Kettle, MD, at [facility].  DISCHARGE DIAGNOSES: 1. Left wrist pain, unknown etiology. 2. Left knee pain, unknown etiology. 3. Recent small bowel obstruction. 4. Vitamin B12 deficiency. 5. Glaucoma. 6. Cataracts. 7. Hypertension. 8. Sick sinus syndrome. 9. Allergic rhinitis. 10.Esophageal stricture. 11.Reflux. 12.Hiatal hernia. 13.Diverticulosis. 14.Benign prostatic hypertrophy. 15.Hip pain. 16.History of colonic polyps. 17.Chronic kidney disease.  DISCHARGE MEDICATIONS:  New medications, tramadol 50 mg 1 tablet p.o. q.6 h. P.r.n.  Discontinue the following medications: 1. Aspirin 81 mg 1 tablet p.o. daily. 2. Bystolic 5 mg 1 tablet p.o. daily. 3. Hydrochlorothiazide 25 mg 1 tablet p.o. daily. 4. Protonix 40 mg 1 tablet p.o. daily. 5. Vitamin B12 injections monthly.  PROCEDURES/IMAGING: 1. He had an x-ray of the knee on January 29, 2011, impression     suprapatellar joint effusion.  No plain film evidence of     osteomyelitis. 2. He had a wrist x-ray on January 29, 2011, impression erosion of the     lunate, this may be related to arthropathy rather than infection;     however, infection cannot be entirely excluded in a proper clinical     setting.  Left first metacarpal carpal and degenerative changes are     present. 3. Upper extremity Doppler, impression no evidence of deep vein or     superficial thrombosis involving the left upper extremity.  LABORATORY DATA:  At the time of admission, WBCs of 11.6, hemoglobin 11.3, hematocrit 33.2, and platelets 259,000.   Sodium was 133, potassium 3.8, chloride 98, bicarb 26, creatinine 1.738, BUN of 15, glucose of 156.  During the admission, uric acid was checked and found to be 6.6. At time of discharge, WBCs were 7.3, hemoglobin 11.2, hematocrit 30.0, platelets 294,000.  Sodium was 137, potassium 4.2, chloride 99, bicarb 28, creatinine 1.34, BUN of 15, glucose of 111.  BRIEF HOSPITAL COURSE:  This an 77 year old male with recent hospitalization for small bowel obstruction and pneumonia here with swelling and redness of the left wrist and left knee. 1. Left wrist and left knee swelling.  Initially, this was felt to be     a possible cellulitis, so the patient was initially started on     vancomycin.  Also thought that there was a possibility of upper     extremity DVT, so the patient was also initially placed on a     heparin drip and upper extremity Dopplers were ordered.  Upper     extremity Doppler showed no evidence of DVT, so heparin drip was     stopped.  Other possibility of source of pain and swelling was     polyarticular gout.  X-ray  of wrist did show erosion of the lunate     and possible arthropathy.  He did receive colchicine during the     admission.  The patient's pain, swelling, and redness did improve,     unsure of which of the treatments improved his symptoms.  Felt like     most likely it was polyarticular and gout; however, NSAIDs were     avoided at this time due to his chronic kidney disease.  Also at     time of discharge, he continued to get better, so steroid injection     was avoided at that time.  He was given tramadol for pain control.     He progressively continued to do well throughout the admission and     his left wrist pain had almost completely resolved prior to     discharge and his knee pain was continuing to resolve. 2. Recent small bowel obstruction.  The patient had no symptoms of     small bowel obstruction during this hospitalization. 3. Chronic kidney disease.   The patient's creatinine remained stable     throughout the hospitalization and was better than baseline at time     of discharge at 1.34.  Recommend followup BMET at his followup     appointment.  NSAIDs were avoided during this admission due to his     chronic kidney disease. 4. Hypertension.  He was continued on his home medicines of Bystolic     and hydrochlorothiazide.  His blood pressures did well throughout     the hospitalization.  DISCHARGE INSTRUCTIONS:  The patient was instructed to increase activity slowly with no restrictions in diet.  He is to avoid straining and stepping activity that causes chest pain, shortness of breath, and swelling or excessive weakness.  He was reminded that if he developed increased redness or swelling to call [facility].  FOLLOWUP:  He is to follow up with Dr. Jennette Kettle on February 05, 2011, at 11 a.m.  He was given phone number if he needs to change this appointment.  DISCHARGE CONDITION:  The patient was discharged home in stable medical condition.    ______________________________ Samuel Coombe, MD   ______________________________ Nestor Ramp, MD   CM/MEDQ  D:  02/05/2011  T:  02/05/2011  Job:  [id number]  Electronically Signed by Samuel Coombe MD on 02/18/2011 09:24:45 PM Electronically Signed by Denny Levy MD on 02/28/2011 03:47:02 PM

## 2011-03-03 ENCOUNTER — Encounter: Payer: Self-pay | Admitting: Family Medicine

## 2011-03-20 NOTE — H&P (Signed)
NAME:  Samuel, Ford NO.:  [id number]  MEDICAL RECORD NO.:  [id number]           PATIENT TYPE:  I  LOCATION:  5008                         FACILITY:  [facility]  PHYSICIAN:  Nestor Ramp, MD        DATE OF BIRTH:  22-Jun-1920  DATE OF ADMISSION:  01/28/2011 DATE OF DISCHARGE:                             HISTORY & PHYSICAL   CHIEF COMPLAINT:  Left hand wrist and left knee pain and swelling.  HISTORY OF PRESENT ILLNESS:  [name] is an 77 year old male presenting with left hand and wrist pain and left knee pain that started the afternoon after discharge from hospital on Saturday, January 25, 2011. The patient was recently admitted for small bowel obstruction that has resolved.  Therefore, the patient was discharged on Saturday, January 25, 2011.  The patient noted some discomfort in both joints, both the left wrist and the left knee.  He states that it was not bad, so he ignored it.  He first noted this discomfort on Saturday after discharge.  Over the weekend, the pain and swelling increased, so he decided to come to the hospital for evaluation.  The patient denies nausea, vomiting, or diarrhea.  No fever.  No chills.  No rashes.  No trauma to hand or knee. Positive pain with movement of left wrist, left hand fingers, and left knee.  Left hand is where IV site was on previous admission.  PAST MEDICAL PROBLEMS: 1. Vitamin B12 deficiency. 2. Glaucoma. 3. Cataracts. 4. Hypertension. 5. Sick sinus syndrome. 6. Allergic rhinitis. 7. Esophageal stricture. 8. Reflux. 9. Hiatal hernia. 10.Diverticulosis. 11.BPH. 12.Chronic kidney disease, stage III.  PAST SURGICAL HISTORY:  His replacement.  SOCIAL HISTORY:  The patient denies tobacco.  Stopped smoking in 1971. No drugs.  No alcohol.  Last drink, 1989.  The patient is retired from [employer].  Wife passed away approximately 4 years ago.  FAMILY HISTORY:  Mother with Parkinson's and CHF.  Daughter,  CHF.  ALLERGIES:  CODEINE causes nausea.  Medications which were obtained from the discharge summary from Saturday: 1. Florastor 250 mg p.o. daily. 2. Aspirin 81 mg p.o. daily. 3. Bystolic 5 mg p.o. daily. 4. Hydrochlorothiazide 25 mg 1 tablet p.o. daily. 5. Protonix 1 tablet p.o. daily. 6. Vitamin B12 injections monthly. 7. The patient also received azithromycin 500 mg 1 tablet p.o. daily     that was for one additional day after the day of discharge.  REVIEW OF SYSTEMS:  Per HPI.  PHYSICAL EXAMINATION:  VITAL SIGNS:  Pulse 67, blood pressure 177/78, respiratory rate 18, pulse ox 99% on room air, and temperature 98.2. GENERAL:  Resting quietly, sitting in bed, watching TV. HEENT:  Normocephalic and atraumatic. HEART:  Regular rate and rhythm.  No murmurs, rubs, or gallops. LUNGS:  Clear to auscultation bilateral.  No wheezes. ABDOMEN:  Soft, nontender, and nondistended. EXTREMITIES:  Left hand and wrist exam:  Left wrist limited range of motion secondary to pain.  All fingers of left hand edematous.  This digit is the most swollen and reddened.  Dorsal and palmar aspects of the hand edematous.  Point tenderness over dorsal aspect of the wrist and over fifth digit of left hand, warm to touch light, red, pink tone to skin.  No streaking or tracking of red skin tone.  Left knee exam red, warm to touch.  Positive effusion noted around patella.  Decreased range of motion secondary to pain.  Entire knee tender to palpation. SKIN:  No rash. NEUROLOGY:  Alert and oriented x3.  Follows all commands.  LABORATORY DATA:  Sodium 133, creatinine 1.73, and glucose 156.  White blood cells 11.6 and hemoglobin 11.3.  PLAN:  [name] is an 77 year old male presenting with left hand, wrist pain, and left knee pain. 1. Left hand wrist pain and left knee pain.  The patient has not had     any fever and states that except for pain in the joint, he feels     well.  No elevation of white blood  cell count.  Therefore, systemic     infection is less likely.  Ultrasound of left upper extremity     ordered to rule out DVT.  May have local infection on the arms     since the arm was the IV site at previous hospitalization.  Left     knee swollen.  States that he has never had this problem before.     Could possibly tap the knee since empiric antibiotic already     started.  Most likely, this will affect any culture that could be     obtained.  Ultimately consider a possibility of gout.  Could add     colchicine to regimen.  The patient has elevated creatinine, so we     will avoid NSAIDS at this time.  We will use tramadol as needed for     pain. 2. Renal insufficiency.  Creatinine consistent with the value obtained     at DC.  We will monitor closely. 3. Hypertension.  We will restart home med regimen, Bystolic and HCTZ     to bring pain and blood pressure control. 4. Fluids, electrolytes, nutrition and gastrointestinal.  Regular     diet. 5. Prophylaxis.  Heparin 5000 units subcu t.i.d. 6. Disposition.  Pending clinical improvement.     Ellin Mayhew, MD   ______________________________ Nestor Ramp, MD    DC/MEDQ  D:  01/29/2011  T:  01/29/2011  Job:  [id number]  Electronically Signed by Ellin Mayhew  on 03/04/2011 02:23:36 PM Electronically Signed by Denny Levy MD on 03/20/2011 09:31:32 AM

## 2011-03-31 ENCOUNTER — Ambulatory Visit (INDEPENDENT_AMBULATORY_CARE_PROVIDER_SITE_OTHER): Payer: Medicare Other | Admitting: *Deleted

## 2011-03-31 DIAGNOSIS — E538 Deficiency of other specified B group vitamins: Secondary | ICD-10-CM

## 2011-03-31 MED ORDER — CYANOCOBALAMIN 1000 MCG/ML IJ SOLN
1000.0000 ug | Freq: Once | INTRAMUSCULAR | Status: AC
  Administered 2011-03-31: 1000 ug via INTRAMUSCULAR

## 2011-04-01 NOTE — Op Note (Signed)
NAME:  [name], [name] NO.:  [id number]   MEDICAL RECORD NO.:  [id number]          PATIENT TYPE:  INP   LOCATION:  X005                         FACILITY:  [facility]   PHYSICIAN:  Ollen Gross, M.D.    DATE OF BIRTH:  December 03, 1919   DATE OF PROCEDURE:  07/21/2007  DATE OF DISCHARGE:                               OPERATIVE REPORT   PREOPERATIVE DIAGNOSIS:  Osteoarthritis, left hip.   POSTOPERATIVE DIAGNOSIS:  Osteoarthritis, left hip.   PROCEDURE:  Left total hip arthroplasty.   SURGEON:  Ollen Gross, M.D.   ASSISTANT:  Avel Peace PA-C   ANESTHESIA:  General.   ESTIMATED BLOOD LOSS:  200 mL.   DRAIN:  None.   COMPLICATIONS:  None.   CONDITION:  Stable to recovery.   BRIEF CLINICAL NOTE:  Mr. Sweetin is an 77 year old male with severe end-  stage arthritis of the left hip with progressively worsening pain and  dysfunction.  He has failed nonoperative management and presents for  total hip arthroplasty.   PROCEDURE IN DETAIL:  The successful administration of general  anesthetic, the patient was placed in the right lateral decubitus  position with a left side up and held with the hip positioner.  Left  lower extremity isolated from his perineum with plastic drapes and  prepped and draped in usual sterile fashion.  Short posterolateral  incision was made with 10 blade through subcutaneous tissue to the level  of fascia lata which was incised in line with the skin incision.  Sciatic nerve is palpated protected and short rotators isolated off the  femur.  Capsulectomy is performed.  The hips dislocated.  Center of  femoral head is marked and a trial prosthesis placed such that the  center of trial head corresponds to the center of his native femoral  head.  Osteotomy lines marked on the femoral neck and osteotomy made  with an oscillating saw.  Femoral head removed and the femur retracted  anteriorly to gain acetabular exposure.   Femur was retracted  anteriorly and acetabular retractors were placed.  The labrum and osteophytes were then removed.  Reaming starts at 47 mm  coursing increments of 2 to 55 mm and then a 56 mm Pinnacle acetabular  shell was placed in anatomic position and transfixed with two dome  screws.  Trial 36-mm neutral +4 liner was placed.   Femur was prepared with the canal finder and irrigation.  Axial reaming  is performed to 15.5 mm, proximal reaming to a 20 F.  The sleeve  machined to a large.  20 F large trial sleeve is placed, 20 x 15 stem  and 36 plus 8 neck matching native anteversion.  The trial 36 plus 0  head is placed in the hip is reduced with outstanding stability.  There  is full extension, full external rotation, 70 degrees flexion, 40  degrees adduction 90 degrees internal rotation, 90 degrees of flexion  and 40 degrees of internal rotation.  By placing left leg on top of the  right, leg lengths were found to be equal.  Hip was then dislocated.  All trials removed.  The permanent apex hole eliminator is placed in  acetabular shell.  The permanent 36 mm neutral +4 Marathon liner is  placed.  On the femoral side we placed the 20 F large sleeve and 20 x 15  stem and 36 plus +8 neck matching native anteversion.  The 36.0 head is  placed and the hips reduced with the same stability parameters.  Wounds  copiously irrigated saline solution and short rotators reattached to the  femur through drill holes.  Fascia lata was closed with interrupted #1  Vicryl subcu closed with #1-0 and #2-0 Vicryl and subcuticular running 4-  0 Monocryl.  The incisions cleaned and dried and Steri-Strips and bulky  sterile dressing applied.  He is placed into a knee immobilizer,  awakened, transferred to recovery in stable condition.      Ollen Gross, M.D.  Electronically Signed     FA/MEDQ  D:  07/21/2007  T:  07/21/2007  Job:  [id number]

## 2011-04-01 NOTE — H&P (Signed)
NAME:  [name], [name] NO.:  [id number]   MEDICAL RECORD NO.:  [id number]          PATIENT TYPE:  INP   LOCATION:  NA                           FACILITY:  [facility]   PHYSICIAN:  Ollen Gross, M.D.    DATE OF BIRTH:  [birth year]   DATE OF ADMISSION:  07/21/2007  DATE OF DISCHARGE:                              HISTORY & PHYSICAL   CHIEF COMPLAINT:  Left hip pain.   HISTORY OF PRESENT ILLNESS:  The patient is an 77 year old male who has  been seen by Dr. Lequita Halt recently for ongoing left hip pain which has  been progressive over the past 20 years now.  Over the past several  months it has increased to the point where it is constant all the time.  He has pain with activity and also at rest.  He has been seen in the  past by Dr. Albertha Ghee and noted to have significant arthritis.  He  was subsequently referred to Dr. Lequita Halt where he was found to have  severe arthritis of the left hip with bone-on-bone and essentially no  joint space.  It is felt that he has reached the point where he could  undergo surgical intervention.  Risks and benefits have been discussed  and he elects to proceed with surgery.  He has had a recent stress test  which showed normal pattern of perfusion in all regions, post stress  ejection fraction of 82%, no significant ischemia demonstrated, felt to  be a low risk scanned.  Compared to a previous study there was no  significant change.  Normal myocardial perfusion.   ALLERGIES:  No known drug allergies.   INTOLERANCES:  Codeine causes sickness.   CURRENT MEDICATIONS:  Aspirin, Protonix,  Exforge tabs,  hydrochlorothiazide, Altace, Tylenol, loratadine.   PAST MEDICAL HISTORY:  History which sounds like sick sinus  syndrome/bradycardia requiring pacemaker placement, hypertension, hiatal  hernia, reflux disease, diverticulosis, enlarged prostate, diet-  controlled diabetes mellitus and osteoarthritis.   PAST SURGICAL HISTORY:  Appendectomy  age 29, prostate trimming procedure  approximately 12 years ago.  He has had bilateral cataract procedures,  pacemaker placement about 2 years ago.  He had laparoscopic procedures  secondary to adhesions from his previous appendectomy surgery.   SOCIAL HISTORY:  Married, retired, quit smoking in 1971.  Quit alcohol  in 1990.  He wants to look into a rehabilitation facility.  He would  like to look into [facility] which is close to his home.   FAMILY HISTORY:  Both parents are deceased.  Family history of heart  disease, hypertension.  He has a brother with prostate cancer.   REVIEW OF SYSTEMS:  GENERAL:  No fevers, chills, night sweats.  NEURO:  No seizures, syncope or paralysis.  RESPIRATORY: Intermittent cough.  No  productive cough.  No shortness breath or hemoptysis.  CARDIOVASCULAR:  Does have pacemaker placement for an arrhythmia which sounds like sick  sinus syndrome, bradycardia.  No chest pain or angina.  GI: Some  constipation.  No diarrhea, no nausea or vomiting, no blood or mucous in  the  stool.  GU:  A little bit of frequency and urgency, nocturia.  No  dysuria or hematuria.  MUSCULOSKELETAL: Left hip.   PHYSICAL EXAMINATION:  VITAL SIGNS: Pulse 64, respirations 12, blood  pressure 138/54.  GENERAL:  An 76 year old white male well-nourished, well-developed, no  acute distress, somewhat mildly flat affect.  Good historian.  HEENT: Normocephalic, atraumatic.  Pupils are round and reactive.  Oropharynx clear.  He does have upper partial plate.  NECK:  Supple.  CHEST: Clear anterior and posterior chest walls.  No rhonchi, rales or  wheezing.  HEART: Regular rhythm.  He does have a faint early systolic ejection  murmur, S1, S2 noted.  ABDOMEN: Soft, nontender.  Bowel sounds present.  RECTUM/GENITALIA:  Not done. __________ .  EXTREMITIES: Left hip shows flexion of 90 degrees, zero internal  rotation, 10 degrees external rotation, 10 degrees abduction and  painful  range of motion.   IMPRESSION:  1. Osteoarthritis, left hip.  2. Hypertension.  3. History of bradycardia/possible sick sinus syndrome requiring      pacemaker placement.  4. Cardiac murmur.  5. Hiatal hernia.  6. Reflux disease.  7. Diverticulosis.  8. Enlarged prostate.  9. Diet-controlled diabetes mellitus.   PLAN:  The patient was admitted to [facility] to undergo a left  total hip replacement arthroplasty.  Surgery will be performed by Dr.  Ollen Gross.      Alexzandrew L. Perkins, P.A.C.      Ollen Gross, M.D.  Electronically Signed    ALP/MEDQ  D:  07/20/2007  T:  07/21/2007  Job:  [id number]   cc:   Nestor Ramp, MD   Darlin Priestly, MD  Fax: [phone]   Ollen Gross, M.D.  Fax: [phone]

## 2011-04-01 NOTE — Discharge Summary (Signed)
[name], [name] NO.:  [id number]   MEDICAL RECORD NO.:  [id number]          PATIENT TYPE:  INP   LOCATION:  1605                         FACILITY:  [facility]   PHYSICIAN:  Ollen Gross, M.D.    DATE OF BIRTH:  1920/01/14   DATE OF ADMISSION:  07/21/2007  DATE OF DISCHARGE:  07/26/2007                               DISCHARGE SUMMARY   ADMITTING DIAGNOSES:  1. Osteoarthritis of the left hip.  2. Hypertension.  3. History of bradycardia/possible sick sinus syndrome, requiring      pacemaker placement.  4. Cardiac murmur.  5. Hiatal hernia.  6. Reflux disease.  7. Diverticulosis.  8. Enlarged prostate.  9. Diet-controlled diabetes mellitus.   DISCHARGE DIAGNOSES:  1. Osteoarthritis left hip, status post left total hip replacement      arthroplasty.  2. Postoperative blood loss anemia.  3. Status post transfusion, without sequelae.  4. Mild postoperative hyponatremia.  5. Hypertension.  6. History of bradycardia/possible sick sinus syndrome, requiring      pacemaker placement.  7. Cardiac murmur.  8. Hiatal hernia.  9. Reflux disease.  10.Diverticulosis.  11.Enlarged prostate.  12.Diet-controlled diabetes mellitus.   PROCEDURE:  July 21, 2007, left total hip.  Surgeon:  Dr. Lequita Halt.  Assistant:  Alexzandrew L. Perkins, P.A.C.  Anesthesia:  General.   CONSULTS:  None.   BRIEF HISTORY:  [name] is an 77 year old male with severe end-stage  arthritis of the left hip, progressive worsening pain and dysfunction,  who failed nonoperative management and now presents for total hip  arthroplasty.   LABORATORY DATA:  Preoperative CBC:  Hemoglobin 12.3, hematocrit 36.1,  white cell count 7.2, postop hemoglobin 9.9, drifting down to 8.5.  He  given blood.  Posttransfusion hemoglobin back up to 10.3.  PT/PTT on  admission 12.4 and 28, respectively, 0.9 INR.  Serial pro times  followed.  Last known PT/INR 22.3 and 1.9.  Chem panel on admission:  Elevated BUN at 29.  Remaining chem panel within normal limits.  Serial  BMETs were followed.  Sodium did drop a little bit from 137 to 134, got  as low as 132, stabilized.  BUN came down to 26.  Remaining electrolytes  remained within normal limits.  Preop UA negative.  Blood group type O  positive.   Chest x-ray, July 14, 2007:  COPD without acute failure.  Left hip  films preop, July 14, 2007:  Severe left hip arthritis, mild right hip  arthritis.   EKG, June 18, 2007:  Rate 67, sinus rhythm, unconfirmed.   HOSPITAL COURSE:  The patient was admitted to [facility] and  total the procedure well, later transferred from the recovery room to  the orthopedic floor.  Started on PCA and p.o. analgesic for pain  following surgery.  Had a decent night.  Doing pretty well on the  morning of day #1.  Was weaned over to p.m. medications.  Started on  modified carb diet for his diabetes.  Hemoglobin postop was 9.9.  The  patient lived alone and wanted to look into skilled nurse facility.  We  got social work involved.  Started getting up out of bed by day #2.  He  was doing a little bit better.  Hemoglobin was down to 8.5.  Unfortunately, though, he was feeling tired and weak, so we gave him  blood.  He tolerated the blood well, and posttransfusion hemoglobin came  back up to 10.3.  Dressing was changed on day #2.  Incision looked good.  His pulse rate was in the 60s and 70s.  He was controlled well.  He  started getting up with PT and progressing through the weekend.  He was  ambulating about 100-120 feet.  Continued to tolerate therapy through  the weekend, and by Monday morning, July 26, 2007, he was doing very  well, seen on rounds with Dr. Lequita Halt, stable, and felt ready for  discharge.   DISCHARGE PLAN:  Patient discharged to skilled nursing facility of  choice, awaiting bed offers and patient choice.   DISCHARGE DIAGNOSES:  Please see above.   CURRENT MEDICATIONS AT  TIME OF TRANSFER:  1. Coumadin protocol.  Please titrate the Coumadin level for a target      INR between 2-3.  He needs to be on Coumadin for 3 weeks.  2. Colace 100 mg p.o. b.i.d.  3. Hydrochlorothiazide 50 mg p.o. daily.  4. Protonix 40 mg q.a.m..  5. Exforge, which is amlodipine 5 mg and valsartan 160 mg q.a.m.  6. Altace 20 mg p.o. daily.  7. Vicodin 5 mg 1-2 q.4-6 h. as needed for pain.  8. Tylenol 1-2 q.4-6 h. as needed for mild pain, temperature or      headache.  9. Robaxin 500 mg p.o. q.6-8 h. p.r.n. spasm  10.Claritin 10 mg p.o. daily p.r.n.  11.MiraLax 17 g powder in 8 ounces of water p.o. daily p.r.n.  12.Laxative of choice.  13.Enema of choice.   DIET:  Cardiac diet, heart healthy, modified carb, diabetic diet.   ACTIVITY:  He is partial weightbearing 25% to 50% to the left lower  extremity.  Gait training, ambulation, ADLs.  Total hip protocol.  Hip  precautions.  He needs to be up out of bed minimum b.i.d.  Daily  dressing changes.  May start showering.  However, do not submerge the  incision under water.  Daily dressing change following shower.   FOLLOWUP:  He needs to follow up with Dr. Lequita Halt in the office 2 weeks  from surgery.  Please contact the office at [phone] for appointment  time and transfer of the patient.   DISPOSITION:  Pending at the time of dictation.   CONDITION ON DISCHARGE:  Improving.      Alexzandrew L. Perkins, P.A.C.      Ollen Gross, M.D.  Electronically Signed    ALP/MEDQ  D:  07/26/2007  T:  07/26/2007  Job:  [id number]   cc:   Nestor Ramp, MD   Darlin Priestly, MD  Fax: [phone]

## 2011-04-01 NOTE — Assessment & Plan Note (Signed)
[facility]                         GASTROENTEROLOGY OFFICE NOTE   NAME:Samuel, SHAHEEM Ford                      MRN:          [id number]  DATE:11/26/2007                            DOB:          1920-06-12    REFERRING PHYSICIAN:  Nestor Ramp, MD   [name] is an 77 year old white male with multiple medical problems  whom I have followed for many years.  He is referred today for  evaluation of progressive solid food dysphagia.   Mr. 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  He also has a very redundant and  torturous colon and has floppy-cecum syndrome.  He has chronic  abdominal gas and bloating.  Is having regular bowel movements and no  melena or hematochezia.  He was previously on Nexium but is currently  taking generic pantoprazole 40 mg a day.  He continues with some mild  reflux symptoms.  He denies any hepatobiliary complaints.  He was  recently seen by Dr. Jennette Kettle and had a generally normal physical exam  including liver profile.   PAST MEDICAL HISTORY:  Remarkable for diabetes mellitus, sick sinus  syndrome, benign essential hypertension, glaucoma, BPH, and allergic  conjunctivitis.  He had a hip replacement this past fall with Dr.  Lequita Halt.   MEDICATIONS:  1. Protonix 40 mg a day.  2. Altace 10 mg twice a day.  3. Aspirin 81 mg a day.  4. Hydrochlorothiazide 25 mg a day.  5. Exforge 5-160 a day.  6. Daily multivitamin.   He denies allergies, except nausea and vomiting with CODEINE.   FAMILY HISTORY:  Remarkable for colon cancer in his brother.   SOCIAL HISTORY:  The patient's wife died approximately a month ago.  He  currently is in the grieving process.  He denies severe depression.  He  currently is living alone, but has some step-children who help him with  his care.  He does not smoke or abuse  ethanol.   REVIEW OF SYSTEMS:  Remarkable for mild peripheral edema, diffuse  arthralgias, chronic insomnia, some urinary incontinence, and chronic  hoarseness in the morning, perhaps related to acid reflux.  He denies  any specific cardiovascular, pulmonary, or other neurological or  psychiatric difficulties.  Review of systems otherwise noncontributory.   EXAMINATION:  He is a healthy-appearing white male appearing his stated  age in no acute distress.  He is 5 feet 7-1/2 inches, and weighs 190 pounds.  Blood pressure 128/58  and pulse was 72 and regular.  I could not appreciate stigmata of chronic liver disease, or  thyromegaly.  His chest was clear and he appeared to be in a regular rhythm without  murmurs, gallops, or rubs.  There was no organomegaly, abdominal masses, or significant tenderness.  Bowel sounds were very active, but non-obstructed.  Peripheral extremities were unremarkable, except for trace edema, but no  phlebitis or swollen joints.  Mental status was clear.  Inspection of the rectum shows some perirectal  scaling and irritation,  but no fissures or fistulae.  Rectal exam showed no masses or  tenderness, and there was soft stool present, but it was guaiac  negative.   ASSESSMENT:  1. Chronic gastroesophageal reflux disease and probable recurrent      peptic stricture of the esophageus.  2. Chronic irritable bowel syndrome and associated gas and bloating,      with possible maldigestion of nonabsorbable carbohydrates.  3. Family history of colon carcinoma.  4. Multiple medical problems, including hypertensive cardiovascular      disease and diabetes.  5. Previous colonoscopy showing diverticulosis.   RECOMMENDATIONS:  1. Continue all medications per Dr. Jennette Kettle.  2. Continue reflux regimen and daily PPI therapy.  3. Outpatient endoscopy and probable dilatation.  4. Trial of Align probiotic therapy for several weeks.  5. Followup colonoscopy due in December  2010.     Vania Rea. Jarold Motto, MD, Caleen Essex, FAGA  Electronically Signed    DRP/MedQ  DD: 11/26/2007  DT: 11/26/2007  Job #: [id number]

## 2011-04-04 NOTE — Op Note (Signed)
NAME:  [name], [name] NO.:  [id number]   MEDICAL RECORD NO.:  [id number]          PATIENT TYPE:  OBV   LOCATION:  2550                         FACILITY:  [facility]   PHYSICIAN:  Michel Harrow, MD DATE OF BIRTH:  1919-12-11   DATE OF PROCEDURE:  03/07/2006  DATE OF DISCHARGE:  03/07/2006                                 OPERATIVE REPORT   PREOPERATIVE DIAGNOSES:  1. Prolapsed, necrotic iris tissue, right eye.  2. Leaking corneal wound, right eye.   POSTOPERATIVE DIAGNOSES:  1. Prolapsed, necrotic iris tissue, right eye.  2. Leaking corneal wound, right eye.   PROCEDURE PERFORMED:  1. Excision of necrotic iris tissue, right eye.  2. Reformation of anterior chamber, right eye.  3. Closure of corneal wound, right eye.   ANESTHESIA:  Moderate anesthesia care and local (peribulbar 50/50 mixture of  0.75% Marcaine and 2% lidocaine with epinephrine).   COMPLICATIONS:  None.   BLOOD LOSS:  None.   INDICATIONS FOR SURGERY:  This patient had cataract surgery by another  physician a week and a half ago.  He presented to the physician on call with  increased pain and decreased vision.  He was noted to have a leaking corneal  wound with iris prolapse.   PROCEDURE:  After informed consent was obtained, the patient was transferred  to the operating room and laid in a supine position on the OR table.  The  patient was sedated by the anesthesiologist and a 50/50 mixture of 2%  lidocaine with epinephrine and 0.75% Marcaine was used to give a peribulbar  block around the patient's right eye.  Topical proparacaine 0.5% was  instilled topically.  The patient's right eye was prepped and draped in the  usual sterile ophthalmic fashion.  A lid speculum was then put into  position.  The operating microscope was swung into place.   Using 0.12 forceps and micro Westcott scissors, the necrotic, prolapsed iris  tissue was excised.  A super sharp blade was used to create an  inferior  temporal paracentesis.  Viscoelastic on a 27-gauge cannula was instilled  through the paracentesis to sweep the temporal iris tissue away from the  wound.  Three interrupted 10-0 nylon sutures were used to suture the  temporal corneal cataract wound.  Balanced salt solution was injected  through the temporal paracentesis to deepen the anterior chamber to  physiologic level.  Miochol was injected through the paracentesis, and the  pupil was noted to be more round and better centered than at initial  presentation.  No residual iris tissue was noted in the temporal corneal  wound.  This was verified by touching the wound with a Wexell gently.  The  wounds were checked for security, and the eye was noted to be water-tight,  and the anterior chamber was well formed.  The eye was noted to be holding  physiologic pressure.   The lid speculum was removed.  Maxitrol ophthalmic ointment was placed into  the patient's right eye.  This is followed by a sterile eye patch and Fox  shield.  Patient was transferred  to the recovery room in good, stable  condition.  He is to follow up with Dr. Charlotte Sanes in the morning at 10: 00 at  [facility].  He is to keep his patch and shield  on overnight.  Instructions were reviewed with the patient's family.      Michel Harrow, MD     CLM/MEDQ  D:  03/07/2006  T:  03/09/2006  Job:  [id number]

## 2011-04-04 NOTE — H&P (Signed)
NAME:  [name], [name] NO.:  [id number]   MEDICAL RECORD NO.:  [id number]           PATIENT TYPE:   LOCATION:                                 FACILITY:   PHYSICIAN:  Santiago Bumpers. Hensel, M.D.DATE OF BIRTH:  06-Jul-1920   DATE OF ADMISSION:  10/02/2005  DATE OF DISCHARGE:                                HISTORY & PHYSICAL   CHIEF COMPLAINT:  Abdominal pain and syncope.   HISTORY OF PRESENT ILLNESS:  Patient's abdominal pain started approximately  two weeks ago initially after meals and has now changed to being all the  time starting today.  Patient is achy and has some right-sided soreness.   At dinner tonight patient began to feel weak and cold with nausea.  Told his  son he was going to faint and then came to the ER.  He has had occasional  weakness episodes during the day now and again.   REVIEW OF SYSTEMS:  No chest pain.  Occasional shortness of breath in the  a.m.  No bloody stools.  No tarry stools.  No diarrhea or constipation.   PROBLEM LIST:  1.  Hypertension.  2.  Glaucoma.  3.  Conjunctivitis.  4.  Diabetes 2.  5.  BPH.  6.  Cataracts.  7.  Dysphagia.  8.  GERD.   MEDICATIONS:  1.  Altace 10 mg daily.  2.  Aspirin 81 mg daily.  3.  Atacand 32 mg daily.  4.  Balcyanocobalamin 250 mcg q.i.d.  5.  Loratadine p.r.n.  6.  Vitamins daily.  7.  Nasacort one to two sprays each nostril b.i.d.  8.  Nexium 40 mg daily.  9.  Patanol one to two drops each eye b.i.d.   ALLERGIES:  CODEINE causes nausea.   FAMILY HISTORY:  Mother died at 81 from Parkinson's.  Father died of colon  cancer in 20s, also had hypertension.  A sister at 26 from a blood  infection, arthritis.   SOCIAL HISTORY:  Patient lives in [place] with disabled wife,  __________ Alinda Money, retired.  Prior 40-pack-year tobacco history, quit 30  years ago.  Prior social alcohol, none since 1990.  Independent in ADLs.  Exercises bike 30 minutes daily.   CODE STATUS:  Patient is  full code.   PHYSICAL EXAMINATION:  VITAL SIGNS:  96.8, pulse 63, went up to 72,  respirations 18, blood pressure 124/57.  GENERAL APPEARANCE:  No apparent distress.  Mental status:  Alert and  oriented.  HEENT:  Pupils are equal, round, and reactive to light.  Extraocular  movements intact.  Oropharynx was clear.  No lymphadenopathy.  No  thyromegaly.  LUNGS:  Clear to auscultation bilaterally.  HEART:  Regular rate and rhythm.  No murmurs.  2+ pulses bilaterally.  ABDOMEN:  Soft, distended, hypo bowel sounds with mild hyporesonance to  percussion.  Diffusely tender to palpation, greater tenderness in lower  quadrant.   LABORATORIES:  Fecal occult blood negative.  White blood cells 10.7, H&H  14/41.3, platelets 225.  ANC was 9.7 which is high.  Sodium 137, potassium  4.6,  chloride 106, bicarbonate 25, BUN 22, creatinine 1.2, platelets 170.  UA:  Specific gravity 1.027, lipase 14.  Point of care markers negative x1.  EKG showed an initial rate of 21.  Second EKG showed a rate of 61 in sinus  rhythm.  CT of the abdomen and pelvis showed mild small bowel obstruction  secondary to adhesions; bibasilar atelectasis.   ASSESSMENT/PLAN:  This is an 77 year old male with bradycardia and  concomitant abdominal pain.  1.  Syncope.  This is likely caused by bradycardia.  Patient got better with      one amp of atropine intravenous.  Patient has chronic bradycardia, but      has never before had symptoms.  We have consulted cardiology, consider      pacemaker.  Admit to tele bed.  Repeat EKG in a.m.  H&H stable.  Point      of care negative x1.  We will repeat cardiac enzymes and check 2-D      echocardiogram.  2.  Abdominal pain.  Patient has small bowel obstruction by CT.  Will keep      patient n.p.o. and get GI consult.  Symptoms in some ways do sound like      mesenteric ischemia but patient does have tenderness to palpation and we      will consider CT angio.  Lipase is within normal  limits; therefore, it      is not likely pancreatitis.  No gallstones on CT and pain is not in the      right upper quadrant.  Therefore, unlikely gallbladder disease.  We will      consult GI in the a.m.  3.  Hypertension.  Continue home medications.  Well controlled.  4.  Glaucoma on home medications.  5.  Diabetes type 2 diet controlled.  6.  Gastroesophageal reflux disease.  Home Nexium.  7.  Code status:  Full code.      Rolm Gala, M.D.    ______________________________  Santiago Bumpers. Leveda Anna, M.D.    Bennetta Laos  D:  10/02/2005  T:  10/02/2005  Job:  [id number]

## 2011-04-04 NOTE — Assessment & Plan Note (Signed)
[facility]                           GASTROENTEROLOGY OFFICE NOTE   NAME:[name], [name]                      MRN:          [id number]  DATE:10/09/2006                            DOB:          1920-01-23    [name] is an 77 year old white male with chronic BPH, coronary artery  disease, heart failure with pacemaker insertion, and history of a chronic  floppy cecum syndrome per previous colonoscopy exams.  He apparently was  hospitalized a year ago by Dr. Ezzard Standing with small bowel obstruction, and the  patient relates he had partial bowel resection, although I do not have these  records for review.  I have seen him many times in the past, and his last  barium enema exam was in 1999, showed severe diverticulosis throughout the  colon.  Previous abdominal ultrasound exams in 2004 were normal.   [name] comes to the office today, mostly complaining of abdominal gas,  bloating, and alternating diarrhea and constipation.  He really denies much  abdominal pain.  He denies melena or hematochezia, anorexia, or weight loss.  He denies reflux symptoms and is taking Protonix 40 mg daily.  He has, in  the past, had some nonspecific dysphagia with a negative work-up including  empiric esophageal dilatations.  He is followed by Dr. Cleophas Dunker at [facility] and Dr. Jenne Campus for his cardiac problems.   PAST MEDICAL HISTORY:  1. Appendectomy in 1997.  2. His BPH is apparently managed by Dr. Etta Grandchild, but I have no notes in our      office chart for review along these lines.  3. The patient additionally suffers from hypertension and glaucoma.   MEDICATIONS:  1. Protonix 40 mg daily.  2. Altace 10 mg twice daily.  3. Aspirin 81 mg daily.  4. Xalatan eye drops daily.  5. HCTZ 25 mg daily.  6. Exforge 5 days 160 mg daily.  7. Detrol LA daily.  8. He apparently was supposed to start on prednisone for hip pain,      allegedly per Dr. Cleophas Dunker, but he has  not done such.   He allegedly has nausea and vomiting with CODEINE use.   FAMILY HISTORY:  Remarkable for his brother who had colon cancer at age 95.   SOCIAL HISTORY:  The patient is married and lives with his wife.  He has a  high school education and is retired.  He does not smoke or abuse ethanol.   REVIEW OF SYSTEMS:  Negative for any current cardiovascular or pulmonary  complaints.  He also is doing well from a urologic standpoint.  Review of  systems otherwise noncontributory.   PHYSICAL EXAMINATION:  GENERAL:  He is a healthy-appearing elderly white  male appearing younger than his stated age.  VITAL SIGNS:  He is 5 feet 8 inches tall, weighs 174 pounds.  Blood pressure  is 126/62, and pulse was 64 and regular.  I could not appreciate stigmata of  chronic liver disease.  CHEST:  Clear.  CARDIAC:  Unremarkable.  He seemed to be in a regular rhythm at this time.  ABDOMEN:  There is no hepatosplenomegaly, abdominal masses, or tenderness.  MENTAL STATUS:  Clear.  EXTREMITIES:  There is no peripheral edema, phlebitis, or swollen joints.  RECTAL:  Deferred.   ASSESSMENT:  1. History of recent intestinal adhesions with small intestinal      obstruction requiring surgery per Dr. Ezzard Standing.  We will try to obtain      these records for review.  2. Status post appendectomy.  3. Possible bacterial overgrowth syndrome accounting for  many of his      chronic gastrointestinal complaints.  4. Chronic gastroesophageal reflux disease.  5. Multiple cardiovascular issues with pacemaker implantation and chronic      aspirin therapy.  6. History of chronic benign prostatic hypertrophy.  7. History of glaucoma.  8. History of extensive diverticulosis of the colon.   RECOMMENDATIONS:  1. Continue all medications as listed above.  2. Empiric trial of Xifaxan 400 mg t.i.d. x10 days along with Align      probiotic therapy.  3. Continue reflux regimen and Protonix therapy.  4. Outpatient  colonoscopy exam for family history of colon carcinoma.     Vania Rea. Jarold Motto, MD, Caleen Essex, FAGA  Electronically Signed    DRP/MedQ  DD: 10/09/2006  DT: 10/09/2006  Job #: [id number]   cc:   Melina Fiddler, MD  Darlin Priestly, MD  Claudette Laws, M.D.

## 2011-04-04 NOTE — Consult Note (Signed)
NAME:  [name], [name] NO.:  [id number]   MEDICAL RECORD NO.:  [id number]          PATIENT TYPE:  INP   LOCATION:  3706                         FACILITY:  [facility]   PHYSICIAN:  Santiago Bumpers. Hensel, M.D.DATE OF BIRTH:  1920/07/19   DATE OF CONSULTATION:  10/03/2005  DATE OF DISCHARGE:                                   CONSULTATION   HISTORY OF PRESENT ILLNESS:  Mr. Goar is an 77 year old male patient  admitted yesterday after a syncopal episode at [facility].  He was seen by EMS  services and found to have bradycardia.  He has now been seen by cardiology,  and recommendations are outlined in the chart regarding permanent pacemaker.   From our standpoint, the patient has had a two-week history of intermittent  abdominal pain crampy.  This discomfort worsened yesterday morning, and  more specifically hurt over the lower aspect of the abdominal wall.  When he  came to the emergency room, a CT scan showed a mid-small bowel obstruction  as well as diverticulosis of the sigmoid colon.  Followup x-ray this morning  shows a bowel gas pattern consistent with small-bowel obstruction.  He does  have an NG tube with about 400 cc out since it has been placed.   REVIEW OF SYSTEMS:  No chest pain, no shortness of breath.  Otherwise,  negative.   ALLERGIES:  CODEINE CAUSES NAUSEA.   CURRENT MEDICATIONS:  1.  Baby aspirin.  2.  Lovenox.  3.  Avapro.  4.  Dilantin.  5.  Protonix.  6.  Altace.   SOCIAL HISTORY:  He lives in [place].  Married.  Former alcohol and  tobacco use.   FAMILY HISTORY:  Mom died at age 24 of Parkinson's.  Dad died at age 55 of  colon cancer and had hypertension.   PAST MEDICAL HISTORY:  1.  Hypertension.  2.  Glaucoma.  3.  Gout.  4.  Diabetes mellitus, diet-controlled.  5.  BPH.  6.  Arthritis.  7.  Cataracts.  8.  Conjunctivitis.  9.  Dysphagia.  10. GERD.   Of note, he did have a previous episode of syncope in February of 2005 that  was diagnosed as gastroenteritis.   LABORATORY DATA:  Glucose 130.  __________ occult positive.  Otherwise, his  labs are normal.   PHYSICAL EXAMINATION:  VITAL SIGNS:  Temperature 97.1, pulse 71,  respirations 18, blood pressure 145/53, O2 saturation 96% on room air.  HEENT:  Grossly normal.  NECK:  No carotid or subclavian bruits.  No JVD or thyromegaly.  CHEST:  Clear to auscultation bilaterally, no wheeze or rhonchi.  HEART:  Regular rate and rhythm.  1/6 systolic murmur.  ABDOMEN:  Soft but somewhat distended.  More tender over the lower abdominal  wall.  He does have right appendectomy scar.  EXTREMITIES:  No peripheral edema.  SKIN:  Warm and dry.  NEUROLOGIC:  Intact.   ASSESSMENT:  1.  Small-bowel obstruction.  2.  Hypertension.  3.  Glaucoma.  4.  Gout.  5.  Diabetes mellitus, diet-controlled.  6.  Benign prostatic  hypertrophy.  7.  Arthritis.  8.  Cataracts.  9.  Conjunctivitis.  10. Dysphagia.  11. Gastroesophageal reflux disease.   PLAN:  Continue the patient with IV fluids and keep him n.p.o.  He admits to  some flatus and bowel movements during the day today and I expect this has  resolved to a partial small-bowel obstruction that will hopefully resolve on  its own without surgery.  I will recheck the abdominal films in the morning.  The patient will be seen by Dr. Luisa Hart.      Guy Franco, P.A.    ______________________________  Santiago Bumpers Leveda Anna, M.D.    LB/MEDQ  D:  10/03/2005  T:  10/05/2005  Job:  [id number]

## 2011-04-04 NOTE — Op Note (Signed)
NAME:  [name], [name] NO.:  [id number]   MEDICAL RECORD NO.:  [id number]          PATIENT TYPE:  INP   LOCATION:  3706                         FACILITY:  [facility]   PHYSICIAN:  Samuel Priestly, MD  DATE OF BIRTH:  10-02-1920   DATE OF PROCEDURE:  10/14/2005  DATE OF DISCHARGE:                                 OPERATIVE REPORT   PROCEDURE:  Insertion of a Medtronic Adapta generator with passive atrial  and ventricular leads.   ATTENDING PHYSICIAN:  [name], M.D.   COMPLICATIONS:  None.   INDICATIONS FOR PROCEDURE:  [name] is an 77 year old male patient of Dr.  Maisie Fus Ford, who was admitted on October 02, 2005, after the patient had  complaint of abdominal pain and a syncopal event.  He has a history of  intermittent syncope and presyncope dating back for several months.  While  in the hospital, he did have a documented bradycardic episode with heart  rate in the 20s consistent with junctional escape rhythm.  Because of his  recurrent syncope with now documented junctional rhythm, he is now referred  for a dual chamber pacemaker implant for sick sinus syndrome.   DESCRIPTION OF PROCEDURE:  After giving informed written consent, the  patient was brought to the cardiac cath lab where the left anterior chest  was shaved, prepped and draped in a sterile fashion.  1% lidocaine was used  to anesthetize the left subclavian region.  Next, an approximately 3 cm mid  infraclavicular incision was carried out and hemostasis was obtained with  electrocautery.  Blunt dissection was used to carry this down to the left  pectoral fascia.  Next, an approximately 3 by 4 cm pocket was created over  the left pectoral fascia and hemostasis was obtained with electrocautery.  The left subclavian vein was then easily entered with a single stick and a  guide-wire was easily passed through the SVC and right atrium.  Following  this, a 9 French dilator and sheath were then  tracked over the retained  guide-wire, the guide-wire and dilator were then removed.  Next, a 58 cm  passive Medtronic lead, model number [id number], serial number [id number], was  then easily passed into the SVC right atrium.  The guide-wire was retained,  the peel away sheath was removed.  A second 9 French dilator and sheath were  then placed over the retained guide-wire and a second Medtronic passive  lead, 53 cm, model 4592, serial number [id number], was then passed into the  right atrium, the guide-wire was retained, the peel away sheath was removed.  The guide-wire was then passed into the sheath.  A J-curve was then placed  in the ventricular lead stylet and the ventricular lead was allowed to  prolapse into the RV apex and threshold was determined.  The R wave measured  at 14 millivolts, threshold was 0.3 volts at 0.5 milliseconds, impedance 743  ohms, current 0.5 milliamps, 10 volts negative for diaphragmatic  stimulation.  The atrial J was then allowed to form and the atrial lead was  placed in the right  atrial appendage.  The P wave measured 3.2 millivolts,  threshold in the atrium was 0.6 volts at 0.5 milliseconds, impedance 459  ohms, current 1.3 milliamps, again, 10 volts negative.  Two silk sutures  were placed to the pectoral fascia and then each lead was secured with two  silk sutures.  The pocket was then copiously irrigated with 1% Kanamycin  solution.  The leads were then connected in serial fashion to a Medtronic  Adapta model number ADD401, serial number [id number], head screws were  tightened, and pacing was confirmed.  Again, hemostasis was confirmed.  The  subcutaneous layer of the pocket was then closed using running 2-0 Vicryl.  The skin layers were then closed using running 4-0 Vicryl.  Steri-Strips  were then applied.  The patient returned to the recovery room in stable  condition.   CONCLUSION:  Successful implantation of a Medtronic Adapta generator, model   number ADD401, serial number [id number], with passive atrial and ventricular  leads.      Samuel Priestly, MD  Electronically Signed     RHM/MEDQ  D:  10/14/2005  T:  10/14/2005  Job:  [id number]   cc:   Samuel Lor, MD  Fax: [phone]

## 2011-04-04 NOTE — Op Note (Signed)
NAME:  [name], [name] NO.:  [id number]   MEDICAL RECORD NO.:  [id number]          PATIENT TYPE:  INP   LOCATION:  3706                         FACILITY:  [facility]   PHYSICIAN:  [name], M.D.  DATE OF BIRTH:  1920/03/27   DATE OF PROCEDURE:  10/08/2005  DATE OF DISCHARGE:                                 OPERATIVE REPORT   PREOPERATIVE DIAGNOSIS:  Small bowel obstruction.   POSTOPERATIVE DIAGNOSIS:  Small bowel obstruction secondary to adhesions of  the right lower quadrant from a prior appendectomy.   PROCEDURE:  Laparoscopic enterolysis of adhesions.   SURGEON:  [name], M.D.   FIRST ASSISTANT:  [name]. [name], M.D.   ANESTHESIA:  General endotracheal anesthesia.   ESTIMATED BLOOD LOSS:  Minimal.   INDICATIONS FOR PROCEDURE:  [name] is an 77 year old white male with a  history of syncope and cardiac issues admitted to the hospital on October 02, 2005.  Unfortunately, he has developed a small bowel obstruction.  At  one point, it was thought this may be getting better, but now his  obstruction has gotten worse, and he now comes for laparoscopic and possible  open exploration for enterolysis of adhesions.   The indications and potential complications of the procedure were explained  to the patient.  The potential complications include but are not limited to  bleeding, infection, the possibility of bowel resection, also the  possibility there is something other than adhesions causing his bowel  obstruction.   DESCRIPTION OF PROCEDURE:  The patient comes to the operating room for  general anesthesia.  The patient was placed in a supine position, his left  arm tucked, right arm to his side, had a Foley catheter placed.  His abdomen  was prepped with Betadine solution.  He was given 1 gram Cefoxitin at the  initiation of the procedure.   I was going to start out laparoscopically just to see what I could see and I  placed 10 mm  laparoscope through a 12 mm Hasson trocar in the infraumbilical  incision.  I placed a 5 mm trocar in the right subcostal, 5 mm trocar in the  left subcostal, and a 5 mm trocar in the left mid abdomen.  Using a 0 degree  10mm laparoscope, I first did an exploration.  The right and left lobes of  the liver were unremarkable.  The anterior wall of the stomach was  decompressed and unremarkable.  The colon was actually distended but so was  much of the small bowel.  I was able to identify the terminal ileum and  cecum.   He had at least two band areas, one where the small bowel was adhesed along  the pelvic brim and a second band that came back on itself medial to the  right colon.  I was able to do enterolysis, doing this laparoscopically.  Again, I identified the ileocecal valve and the antimesenteric folds of fat  at the terminal ileum.  I was able to take the adhesions down.  I used  almost exclusively scissors and used  no cautery or Harmonic scalpel because  of was worried about bowel damage or heat damage from these instruments.   At the end of the procedure, I ran the bowel from the terminal cecum to the  terminal ileum all the way back to the proximal jejunum until I got to just  totally free bowel that was markedly distended.  At this point, I irrigated  the abdomen out with a liter of saline.  I thought I did an adequate  enterolysis of adhesions and found, again, at least two points which I  thought were high grade obstructions which were causing his problems which  had been taken care of.  I saw no evidence of any malignancy or any other  cause for the bowel obstruction.   At the end of the procedure, the NG tube was replaced and I was able to  visualize the NG tube through the wall of the stomach and this was  positioned in the mid stomach.  The patient tolerated the procedure well.  The trocars were then removed, there was no bleeding at any trocar site.  The umbilical port  was closed with a 0 Vicryl suture.  The skin at each site  was closed with a 5-0 Vicryl suture, painted with tincture of Benzoin, and  covered with Steri-Strips.  The patient tolerated the procedure well and was  transported to the recovery room in good condition.  Sponge and needle count  were correct at the end of the case.      [name], M.D.  Electronically Signed     DHN/MEDQ  D:  10/08/2005  T:  10/08/2005  Job:  [id number]   cc:   [name], M.D.  Fax: [phone]   [name], M.D.  Fax: [phone]

## 2011-04-04 NOTE — H&P (Signed)
NAME:  [name], [name]                    ACCOUNT NO.:  [id number]   MEDICAL RECORD NO.:  [id number]                   PATIENT TYPE:  EMS   LOCATION:  MAJO                                 FACILITY:  [facility]   PHYSICIAN:  Billey Gosling, M.D.                 DATE OF BIRTH:  12/18/1919   DATE OF ADMISSION:  12/29/2003  DATE OF DISCHARGE:                                HISTORY & PHYSICAL   CHIEF COMPLAINT:  Vomiting, diarrhea, and syncope.   SUBJECTIVE:  This 77 year old white male presents with complaints of loose  stools and nausea that developed last night, then developed vomiting earlier  this morning.  Patient was on the toilet around approximately 1:30 in the  morning having a bowel movement when he felt lightheaded and subsequently  passed out and hit his head on a tile floor.  This syncopal event was not  witnessed, and patient denies any bowel or bladder incontinence or tongue-  biting.  The patient denies any dizziness now while in the emergency room.  He has not had any recent travel and has not been on recent antibiotics.  He  ate at [employer] yesterday, but his wife is not sick.  He denies  hematochezia or hematemesis.   REVIEW OF SYSTEMS:  Negative for fever, chills, weight loss, cough,  shortness of breath, chest pain, edema, dysuria, polydipsia, or vision  changes.  Positive for heartburn, nausea, vomiting, diarrhea.  A bruise to  his right forehead.  Polyuria for years.  Some hip arthritis on his left  side and dry throat.   PAST MEDICAL HISTORY:  1. Hypertension.  2. Glaucoma.  3. Diabetes mellitus type 2 with last hemoglobin A1C in June, 2004 as 5.7.  4. BPH.  5. Cataract.  6. Diverticulosis.  Last colonoscopy in March, 1998.  7. Arthritis of the hip.  8. Gout.   PAST SURGICAL HISTORY:  1. Nasal surgery in the 1970s.  2. Tonsillectomy in 1929.  3. Appendectomy in 1992.  4. Prostate surgery in 1994.   ALLERGIES:  CODEINE causes nausea.   MEDICATIONS:  1. Altace 10 mg b.i.d.  2. Aspirin 81 mg daily.  3. Atacand 16 mg daily.  4. Xalatan eye drops daily.  5. Multivitamin daily.  6. Nexium, which the patient discontinued last week secondary to dry mouth.  7. Oxytrol patch.  Patient discontinued this last week as well.  8. Patanol eye drops p.r.n.   FAMILY HISTORY:  Mother died of Parkinson's disease.  Father died of  hypertension.  Colon cancer runs in the family.   SOCIAL HISTORY:  Patient lives with his wife in [place] the [place].  He has two  cats and enjoys gardening.  He is independent of all of his ADLs and  exercises daily.  He quit tobacco greater than 30 years ago and denies any  alcohol use currently.   PHYSICAL EXAMINATION:  VITAL SIGNS:  Temperature 97.7, pulse 64,  respirations 16, blood pressure 150/38, 99% on 2 liters of oxygen by nasal  cannula.  GENERAL:  Patient appears comfortable.  He is well-developed and well-  nourished in no acute distress.  He is alert and oriented x 3.  HEENT:  Pupils are equal, round and reactive to light.  Extraocular  movements are intact.  Normocephalic and atraumatic.  Nares without  discharge.  Oropharynx clear.  Mucous membranes slightly dry.  NECK:  Supple.  No lymphadenopathy.  No thyromegaly.  Full range of motion.  LUNGS:  Clear to auscultation bilaterally with good effort.  HEART:  Regular rate and rhythm without murmurs, rubs or gallops.  BACK:  No CVA tenderness.  ABDOMEN:  Soft.  Normoactive bowel sounds.  Mild epigastric and right lower  quadrant tenderness to palpation but no rebound or guarding.  No  hepatomegaly.  EXTREMITIES:  Capillary refill less than two seconds.  Strength 5/5  bilaterally.  No clubbing, cyanosis or edema.  NEUROLOGIC:  Motor and sensation intact.  Finger-to-nose test within normal  limits.  Cranial nerves II-XII grossly intact.  SKIN:  Small ecchymoses on right forehead.   LABORATORY DATA:  White blood count 11.3, hemoglobin 13.8,  hematocrit 40.9,  platelets 205.  Absolute neutrophil count 10.5.  The i-STAT shows sodium  140, potassium 4.4, chloride 107, bicarb 24, BUN 29, creatinine 1.3, glucose  149.   CT of the head showed mild atrophy but no acute bleed.   EKG shows normal sinus rhythm with a rate of 62.  Some LVH but no ischemic  changes noted.   ASSESSMENT/PLAN:  An 77 year old white male with hypertension, diabetes type  2, presents with nausea, vomiting, diarrhea, and a syncopal event.  Problem #1:  Nausea, vomiting, and diarrhea:  Given acute onset of and  leukocytosis, this is most likely gastroenteritis.  Will give Phenergan as  needed for nausea and hydrate with IV fluids.  Doubt this is gastroparesis  from diabetes, and the patient has not had recent antibiotics, so I will not  check a C. diff.  Will start Protonix daily.  Problem #2:  Syncopal event:  History is consistent with a vasovagal event  secondary to dehydration from problem #1, and this was also during a bowel  movement.  Will check orthostatics and hydrate with IV fluids.  Doubt this  was a cardiac event since no chest pain and normal EKG.  Will monitor for 24  hours in the hospital.  Problem #3:  Diabetes mellitus type 2:  Diet controlled.  Patient is due for  a hemoglobin A1C.  Will schedule him  for a followup with his primary care Karris Deangelo as an outpatient for this.  Check CBG twice daily while in the hospital.  Problem #4:  Hypertension:  Elevated blood pressure now and not at goal,  less than 130/80.  Will continue his home medications but check orthostatics  secondary to problem #1.                                                Billey Gosling, M.D.    AS/MEDQ  D:  12/29/2003  T:  12/29/2003  Job:  [id number]   cc:   Altamease Oiler C. Merilynn Finland, M.D.  Fam. Med - Resident - [place], [place] [address]  Fax: [phone]

## 2011-04-04 NOTE — Consult Note (Signed)
Consult requested for evaluation of left hip pain versus radicular pain.   HISTORY:  An 77 year old male who has complained of left hip pain as  well as back pain.  His hip pain is both on the lateral aspect as well  as in the groin.  His back pain is more central and his other pain  includes left calf pain as well as a weakness feeling in his left  anterior shin area.  In addition, he has some minor right-sided knee  pain which he attributes to favoring the left leg and putting more  weight on his right.  He has had a left hip injection which was done at  [facility].  This gave him a good but temporary pain relief of  his groin pain.  His pain is generally worse with ambulation, improves  with rest.   Medication-wise he has been tried on Lyrica for radicular-type pain.  In  addition, he uses acetaminophen for his arthritic pains.   He has had a left hip x-ray on October 07, 2006, showing moderate to  severe osteoarthritis of the hip with joint space narrowing and  subchondral sclerosis.  Other radiologic imaging studies include fluoro  guidance for his left hip injection performed November 03, 2006.  In  addition, he has had MRI of the lumbar spine April 24, 2005, showing facet  arthropathy L2-3, mild stenosis at that level; L3-4 diffuse bulging of  the disk, central disk protrusion, vertebral spurring posteriorly  bilateral facet arthropathy with moderate spinal stenosis.  In L4-5  there is left foraminal encroachment to the disk protrusion and  spondylosis and facet arthropathy with compression of left L4 nerve root  and in L5-S1 there is a mild degenerative spondylolisthesis L5 and S1  due to facet degeneration.   PAST SURGICAL HISTORY:  On March 07, 2006, he had prolapse in the  iris  tissue right eye repaired.  On October 14, 2005, he had an insertion of  pacemaker per Dr.McQueen.  On October 08, 2005, he had small bowel  obstruction with laparoscopic enterolysis of  adhesions per Dr. Ezzard Standing.   His other past medical history includes viral gastroenteritis,  hypertension, gastroesophageal reflux, diabetes type 2 - diet  controlled, glaucoma, cataracts, and benign prostatic hypertrophy.   FUNCTIONAL STATUS:  He is independent with all his self-care mobility.  He assists his 74 year old wife, who is disabled.   His review of systems is positive for trouble walking due to groin pain,  urinary problems, constipation, as well as blood sugar regulation  problems.   FAMILY HISTORY:  Heart disease, high blood pressure.   EXAMINATION:  VITAL SIGNS:  Blood pressure 162/64, pulse 61,  respirations 16, O2 sat 100% room air.  GENERAL:  In no acute distress.  Mood and affect appropriate.  His gait,  he is using a cane.  He favors the left leg.  He has a stiff-legged gait  with the left.  No evidence of toe drag or knee instability.  MUSCULOSKELETAL:  His left hip has markedly reduced internal rotation  and moderate severely reduced external rotation.  There is also limited  hip flexion actively so that he is rated 3- at the left hip.  His knee  extension and ankle dorsiflexion are 5 on the left side.  He is 5/5  throughout in the right lower extremity.  He has decreased sensation to  pinprick in the left L3, L4, L5, and S1 dermatomes.  He has sluggish  deep  tendon reflexes bilateral knees and ankles but no asymmetry.  His  knees show no evidence of effusion or joint deformities.  He has no low  extremity edema or intrinsic atrophy.   IMPRESSION:  1. Left hip osteoarthritis.  I believe this is causing the majority of      his symptoms.  As he is trying to postpone surgery, I think it      would be helpful to repeat the fluoroscopically guided hip      injection which was done at [facility].  2. Lumbar facet arthropathy.  He does have pain with extension and      relatively little pain with flexion.  3. Left L4 radiculitis with some sensory deficits  in multiple lower      lumbar levels.  I do not think this is causing him much pain or      disability at this point, but if he develops increasing left calf      pain, I would suspect this would be the culprit.   I do not think spinal injection is needed at this time.  If he develops  more left calf pain, we could certainly set him up for left L4  transforaminal epidural steroid injection under fluoroscopic guidance.  I explained this to the patient and I will send a copy to his primary  physician, Dr. Melina Fiddler, at [facility].  I will see him back on a p.r.n. basis.      Erick Colace, M.D.  Electronically Signed     AEK/MedQ  D:01/25/2007 13:44:06  T:01/25/2007 15:41:49  Job #:  [id number]   cc:   Melina Fiddler, MD

## 2011-04-04 NOTE — Discharge Summary (Signed)
NAME:  [name], [name]                         ACCOUNT NO.:  [id number]   MEDICAL RECORD NO.:  [id number]                   PATIENT TYPE:  INP   LOCATION:  5501                                 FACILITY:  [facility]   PHYSICIAN:  Leighton Roach McDiarmid, M.D.             DATE OF BIRTH:  09-04-1920   DATE OF ADMISSION:  12/29/2003  DATE OF DISCHARGE:  12/31/2003                                 DISCHARGE SUMMARY   DISCHARGE DIAGNOSES:  1. Viral gastroenteritis.  2. Syncope.  3. Hypertension.  4. Gastrointestinal reflux disease.  5. Diabetes mellitus, type 2, diet controlled.   DISCHARGE MEDICATIONS:  1. Nexium 40 mg p.o. daily.  2. Atacand 16 mg p.o. daily.  3. Altace 10 mg p.o. b.i.d.  4. Aspirin 81 mg daily.  5. Multivitamins one daily.   ADMISSION HISTORY:  Mr. Shaul is an 77 year old white male who presented to  [facility] with diarrhea and vomiting.  While having a diarrhea episode, he  became lightheaded and had a syncopal episode on the toilet.  He was  admitted for IV fluid hydration and limited syncopal workup.   HOSPITAL COURSE:  #1 - VOMITING AND DIARRHEA:  The patient's vomiting  subsided on hospital day #1.  He continued to have copious diarrhea up until  the night of hospital day #2.  He was continued on IV fluid hydration.  He  continued to have a good appetite throughout his hospital stay and took p.o.  easily.  A fecal white cell was ordered, but never sent and his diarrhea  subsided prior to discharge and so it was felt that his gastroenteritis was  more likely viral.  By the day of discharge, he was taking p.o. well without  vomiting or diarrhea.   #2 - SYNCOPAL EPISODE:  A head CT was done in the emergency department which  showed no acute changes or bleed.  His EKG showed a normal sinus rhythm with  a rate of 62 with LVH, but no ST changes.  Orthostatics were checked which  were negative.  The patient was ruled out for an MI.  It was decided that  his syncopal  episode was likely vasovagal in combination with dehydration.  He had no further syncopal episodes of dizziness throughout his  hospitalization.   #3 - HYPERTENSION:  His blood pressure remained within normal limits  throughout his stay.  He was continued on Altace and Atacand.   #4 - GASTROINTESTINAL REFLUX DISEASE:  He was started on Protonix in this  hospitalization and will continue with Nexium as an outpatient.   #5 - DIABETES MELLITUS, TYPE 2, DIET CONTROLLED:  He was placed on a  carbohydrate-modified diet and his fasting sugars remained in the low 100s.  His last hemoglobin A1C was 5.7 in July of 2004.   CONDITION ON DISCHARGE:  The patient was discharged to home in stable  condition.   INSTRUCTIONS GIVEN TO  PATIENT:  The patient was told to continue his normal  medical regimen.  A 90-day supply of Nexium, Atacand, and Altace was  prescribed for him since he mails in his prescriptions.  He was told to make  an appointment with Dr. Merilynn Finland in the next one to two weeks.      Maurice March, M.D.                        Etta Grandchild, M.D.    LC/MEDQ  D:  12/31/2003  T:  12/31/2003  Job:  [id number]   cc:   Altamease Oiler C. Merilynn Finland, M.D.  Fam. Med - Resident - [place], [place] [address]  Fax: [phone]

## 2011-04-04 NOTE — Discharge Summary (Signed)
[name], [name] NO.:  [id number]   MEDICAL RECORD NO.:  [id number]          PATIENT TYPE:  INP   LOCATION:  3706                         FACILITY:  [facility]   PHYSICIAN:  Rolm Gala, M.D.    DATE OF BIRTH:  1920-05-01   DATE OF ADMISSION:  10/01/2005  DATE OF DISCHARGE:  10/15/2005                                 DISCHARGE SUMMARY   PRIMARY CARE PHYSICIAN:  Franklyn Lor, MD   CONSULTING PHYSICIANS:  1.  [facility].  2.  [facility].   FINAL DIAGNOSES:  1.  Partial small bowel obstruction.  2.  Sick sinus syndrome.  3.  Diabetes type 2.  4.  Hypertension.  5.  Glaucoma.  6.  Benign prostatic hypertrophy.  7.  Cataracts.  8.  Dysphagia.  9.  Gastroesophageal reflux disease.   For history and physical, see admission H&P.   LABORATORY DATA:  A CBC at discharge showed white blood cells 8.4, H&H  10.3/29.8, platelets 298.  A total iron on November 27 was 29, TIBC 192,  percent saturation 15.  Blood ferritin level on November 27 was 107.  PTT on  November 26 was 42.  PT 14.5, INR 1.1.  Prealbumin on November 26 was 8.9.  Serum uric acid on November 24 was 4.9.  HbA1c on November 19 was 5.8.  cardiac panels were negative x 3.  Fecal occult blood was positive x 1,  negative x 2.  Blood lipase level was 14.   HOSPITAL COURSE:  This is an 77 year old male who was admitted for a small  bowel obstruction and sick sinus syndrome.   1.  Small bowel obstruction.  The patient was followed by surgery and      initially an NG tube was put down.  The patient was given IV fluids,      made NPO and NG drainage was placed 1:1 with normal saline.  The patient      put out feculent material from his NG tube.  The patient had an      exploratory laparotomy for lysis of adhesions on October 08, 2005.  The      patient's diet was advanced as tolerated and at discharge, the patient      was eating a full diet.  He had no further cramping pain.  2.   Sick sinus syndrome.  On admission, the patient had a heart rate down in      the 20s which came up to 60 with atropine.  The patient was followed by      cardiology.  Thank you very much for your help.  Pacemaker was placed on      October 14, 2005 and a catheterization was done.  The patient was      discharged with a stable heart rate one day after implantation.  3.  Diabetes type 2.  This is diet controlled.  The patient's Hba1c was      within normal limits.  His blood sugars ranged from 100 to 138 while in      the hospital.  4.  Hypertension.  The patient's blood pressures were elevated during most      of his hospital stay, however, he was not on most of his medication      because he was NPO.  Hydrochlorothiazide was added to the patient's      blood pressure regimen.  Consider beta blocker as an outpatient.  5.  Glaucoma.  The patient was continued on his home medications.  6.  BPH, stable.  7.  Cataracts.  The patient was continued on his home medications.  8.  Dysphagia.  9.  GERD.  The patient was continued on his home medications.   For primary MD follow up, please evaluate the patient's blood pressure  medications.  May need to add a beta blocker.   The patient did have a positive fecal occult blood test.  This may be due to  the small bowel obstruction but a colonoscopy might be prudent if he has not  had one recently.  The patient certainly needs stool cards to make sure that  this remains negative.   DISCHARGE MEDICATIONS:  1.  Ferrous sulfate 325 mg daily.  2.  Altace 10 mg daily.  3.  Aspirin 81 mg daily.  4.  Actonel 32 mg daily.  5.  Eyes drops daily as previously.  6.  Nasonex or Nasacort two sprays twice daily.  7.  Nexium 40 mg daily.  8.  Hydrochlorothiazide 12.5 mg daily.  9.  Senokot two tablets twice daily when constipated.      Rolm Gala, M.D.     Bennetta Laos  D:  10/15/2005  T:  10/16/2005  Job:  [id number]   cc:   Franklyn Lor, MD  Fax:  [phone]   [facility]   [facility]

## 2011-04-08 ENCOUNTER — Ambulatory Visit (INDEPENDENT_AMBULATORY_CARE_PROVIDER_SITE_OTHER): Payer: Medicare Other | Admitting: Gastroenterology

## 2011-04-08 ENCOUNTER — Encounter: Payer: Self-pay | Admitting: Gastroenterology

## 2011-04-08 DIAGNOSIS — K219 Gastro-esophageal reflux disease without esophagitis: Secondary | ICD-10-CM

## 2011-04-08 DIAGNOSIS — R1310 Dysphagia, unspecified: Secondary | ICD-10-CM

## 2011-04-08 NOTE — Patient Instructions (Signed)
Your procedure has been scheduled for 04/09/2011, please follow the seperate instructions.

## 2011-04-08 NOTE — Progress Notes (Signed)
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 He is chronically all Protonix 40 mg a day and probiotic. He continues to complain of a hoarseness and choking sensation in his throat with intermittent solid food dysphagia. The patient relates an endoscopy his endoscopies the past have given him transient relief of his problems. He has no symptoms suggestive of achalasia. He has hypertensive cardiovascular disease and apparently this is stable multiple medications. Patient also has a history of chronic B12 deficiency and is on parenteral replacement therapy. Denies lower gastrointestinal or hepatobiliary complaints this time. Despite these complaints has had no anorexia or weight loss, melena or hematochezia. Denies use of alcohol, cigarettes, or NSAIDs.  Current Medications, Allergies, Past Medical History, Past Surgical History, Family History and Social History were reviewed in [employer] record.  Pertinent Review of Systems Negative.. no active cardiopulmonary symptoms but he does have some dyspnea with exertion.   Physical Exam: Elderly appearing white male in no acute distress. I cannot appreciate stigmata of chronic liver disease, thyromegaly or lymphadenopathy. Examination oropharynx is unremarkable. Chest is generally clear and cardiac exam shows regular rhythm without murmurs gallops or rubs. I cannot appreciate hepatosplenomegaly, abdominal masses or tenderness. Bowel sounds are normal. Mental status is normal.   Assessment and Plan: Recurrent esophageal stricturing from acid reflux, consider possible Zenker's diverticulum. I've schedule repeat endoscopy possible dilatation, but I suspect this patient will also need barium swallow exam. Continue current medications as reviewed. Because of his age and cardiovascular problems I have scheduled  his endoscopy with propofol sedation with nurse anesthesia present. Encounter Diagnosis  Name Primary?  Marland Kitchen Dysphagia Yes

## 2011-04-09 ENCOUNTER — Encounter: Payer: Medicare Other | Admitting: Gastroenterology

## 2011-05-05 ENCOUNTER — Ambulatory Visit (INDEPENDENT_AMBULATORY_CARE_PROVIDER_SITE_OTHER): Payer: Medicare Other | Admitting: *Deleted

## 2011-05-05 DIAGNOSIS — E538 Deficiency of other specified B group vitamins: Secondary | ICD-10-CM

## 2011-05-05 MED ORDER — CYANOCOBALAMIN 1000 MCG/ML IJ SOLN
1000.0000 ug | Freq: Once | INTRAMUSCULAR | Status: AC
  Administered 2011-05-05: 1000 ug via INTRAMUSCULAR

## 2011-05-12 ENCOUNTER — Ambulatory Visit (AMBULATORY_SURGERY_CENTER): Payer: Medicare Other | Admitting: Gastroenterology

## 2011-05-12 ENCOUNTER — Encounter: Payer: Self-pay | Admitting: Gastroenterology

## 2011-05-12 ENCOUNTER — Telehealth: Payer: Self-pay | Admitting: *Deleted

## 2011-05-12 DIAGNOSIS — R1310 Dysphagia, unspecified: Secondary | ICD-10-CM

## 2011-05-12 DIAGNOSIS — K449 Diaphragmatic hernia without obstruction or gangrene: Secondary | ICD-10-CM

## 2011-05-12 DIAGNOSIS — K219 Gastro-esophageal reflux disease without esophagitis: Secondary | ICD-10-CM

## 2011-05-12 MED ORDER — SODIUM CHLORIDE 0.9 % IV SOLN: 500.0000 mL | INTRAVENOUS | Status: DC

## 2011-05-12 NOTE — Patient Instructions (Signed)
Discharge instructions given with verbal understanding. Handouts on hiatal hernia and a dilatation diet given. Resume previous medications. 

## 2011-05-12 NOTE — Telephone Encounter (Signed)
Notified pt and his daughter in law, [name], that his Esophageal Manometry is scheduled for 05/26/11 at 11:30am at [facility]. Failed info sheet to pt's home and faxed info to [facility].  Booking # [phone] [name]

## 2011-05-13 ENCOUNTER — Telehealth: Payer: Self-pay | Admitting: *Deleted

## 2011-05-13 NOTE — Telephone Encounter (Signed)
Addended by: Florene Glen on: 05/13/2011 08:13 AM   Modules accepted: Orders

## 2011-05-13 NOTE — Telephone Encounter (Signed)

## 2011-05-23 ENCOUNTER — Ambulatory Visit (INDEPENDENT_AMBULATORY_CARE_PROVIDER_SITE_OTHER): Payer: Medicare Other | Admitting: Family Medicine

## 2011-05-23 ENCOUNTER — Encounter: Payer: Self-pay | Admitting: Family Medicine

## 2011-05-23 VITALS — BP 122/60 | HR 78 | Temp 98.1°F | Ht 67.0 in | Wt 190.0 lb

## 2011-05-23 DIAGNOSIS — R300 Dysuria: Secondary | ICD-10-CM

## 2011-05-23 DIAGNOSIS — N419 Inflammatory disease of prostate, unspecified: Secondary | ICD-10-CM

## 2011-05-23 LAB — POCT URINALYSIS DIPSTICK
Bilirubin, UA: NEGATIVE
Glucose, UA: NEGATIVE
Ketones, UA: NEGATIVE
Spec Grav, UA: 1.01
Urobilinogen, UA: 0.2

## 2011-05-23 MED ORDER — CIPROFLOXACIN HCL 500 MG PO TABS: 500.0000 mg | ORAL_TABLET | Freq: Two times a day (BID) | ORAL | Status: AC

## 2011-05-23 NOTE — Patient Instructions (Signed)
I am concerned you may have prostatitis. Take cipro 2 x day for 14 days.   Return if any new or worsening of symptoms. Make a f/up appt in 2-4 weeks with Dr. Jennette Kettle

## 2011-05-23 NOTE — Progress Notes (Signed)
  Subjective:    Patient ID: [name], male    DOB: 1920-08-11, 77 y.o.   MRN: [id number]  HPI Pain at base of penis: Pain in this area x 2 weeks. Describes as constant ache.   Reports + tenderness to touch in tissue that it between scrotum and anus.  Continues to have urinary retention and urinary frequency- states that this is normal for him. No dysuria. No blood in urine.  No testicular pain.  No redness of penis or scrotum.  No fever.  No n/v.     Stopped taking flomax approx 4 days ago because he didn't feel like it was helping his symptoms and b/c of some dizziness in the am.    Review of Systems As per above    Objective:   Physical Exam  Constitutional: He appears well-developed and well-nourished.  Abdominal: Hernia confirmed negative in the right inguinal area and confirmed negative in the left inguinal area.  Genitourinary: Testes normal and penis normal. Right testis shows no mass, no swelling and no tenderness. Left testis shows no mass, no swelling and no tenderness. No phimosis, paraphimosis, hypospadias, penile erythema or penile tenderness. No discharge found.     Musculoskeletal:       Slowed but even gait          Assessment & Plan:

## 2011-05-26 ENCOUNTER — Ambulatory Visit (HOSPITAL_COMMUNITY)
Admission: RE | Admit: 2011-05-26 | Discharge: 2011-05-26 | Disposition: A | Discharge status: Home / Self Care | Payer: Medicare Other | Source: Ambulatory Visit | Attending: Gastroenterology | Admitting: Gastroenterology

## 2011-05-26 ENCOUNTER — Encounter (INDEPENDENT_AMBULATORY_CARE_PROVIDER_SITE_OTHER): Payer: Medicare Other | Admitting: Gastroenterology

## 2011-05-26 DIAGNOSIS — K219 Gastro-esophageal reflux disease without esophagitis: Secondary | ICD-10-CM | POA: Insufficient documentation

## 2011-05-26 DIAGNOSIS — R1310 Dysphagia, unspecified: Secondary | ICD-10-CM

## 2011-05-26 NOTE — Assessment & Plan Note (Addendum)
Tenderness on palpation of perineum is concerning for prostatitis.  Will treat for acute prostatitis with cipro 500mg  po BID x 14 days.  Pt is to return if any fever, increase in pain, or any new or worsening symptoms. Reviewed case with preceptor.

## 2011-06-03 NOTE — Progress Notes (Signed)
  Esophageal manometry was completed on 05/26/2011. Results are as follows:  #1.upper esophageal sphincter-is normal coordination between pharyngeal contraction and cricopharyngeal relaxation.  #2. Lower esophageal sphincter-vein pressure is 30 mm mercury with normal relaxation to swallowing.  #3. Motility pattern-there are normally propagated waves to wet and dry swallows throughout the length of the esophagus. Mean amplitude contractions is 95 mm of mercury. 82% of waves are peristaltic.  Assessment this is a normal esophageal manometry without any changes of an esophageal motility disturbance to explain this patient's recurrent dysphagia.

## 2011-06-04 ENCOUNTER — Encounter: Payer: Self-pay | Admitting: Gastroenterology

## 2011-06-05 ENCOUNTER — Ambulatory Visit (INDEPENDENT_AMBULATORY_CARE_PROVIDER_SITE_OTHER): Payer: Medicare Other | Admitting: Family Medicine

## 2011-06-05 ENCOUNTER — Encounter: Payer: Self-pay | Admitting: Family Medicine

## 2011-06-05 VITALS — BP 131/78 | HR 86 | Temp 98.3°F | Ht 66.75 in | Wt 188.0 lb

## 2011-06-05 DIAGNOSIS — E538 Deficiency of other specified B group vitamins: Secondary | ICD-10-CM

## 2011-06-05 DIAGNOSIS — N138 Other obstructive and reflux uropathy: Secondary | ICD-10-CM

## 2011-06-05 DIAGNOSIS — N419 Inflammatory disease of prostate, unspecified: Secondary | ICD-10-CM

## 2011-06-05 DIAGNOSIS — N401 Enlarged prostate with lower urinary tract symptoms: Secondary | ICD-10-CM

## 2011-06-05 MED ORDER — CYANOCOBALAMIN 1000 MCG/ML IJ SOLN
1000.0000 ug | Freq: Once | INTRAMUSCULAR | Status: AC
  Administered 2011-06-05: 1000 ug via INTRAMUSCULAR

## 2011-06-05 NOTE — Patient Instructions (Signed)
Follow up with dr. Jennette Kettle in 2-4 weeks regarding urinary symptoms

## 2011-06-05 NOTE — Assessment & Plan Note (Signed)
Pt states that he wants to try to take flomax again.  I encouraged him to do so with caution and with possible side effects in mind.  Could consider in future possibly starting pt on finasteride?  Also if pt does have dizziness with flomax, but flomax helps symptoms, could consider possibly decreasing the dose of the HCTZ to help prevent hypotension.  BP WNL today. Will defer management of this problem to primary care physician who knows this pt best.

## 2011-06-05 NOTE — Progress Notes (Signed)
  Subjective:    Patient ID: [name], male    DOB: 1920-06-05, 77 y.o.   MRN: [id number]  HPI F/up prostatitis: Much improved, no fever, pain at base of penis now completely resolved.  States he feels much better.    BPH urinary symptoms: Continues to have problems with getting stream started and "dribbling" once stream has started.  This is a chronic issue.  Stopped taking flomax b/c he was concerned about dizziness that he was having some mornings.  States he wants to know if he should restart this b/c the symptoms of the BPH are very bothersome, more bothersome than the mild dizziness that the flomax may have possibly caused.  Pt had no falls while on flomax in past, but did notice occasional mild dizziness, especially in the mornings. Pt isn't sure if the flomax helped his symptoms or not when he took it previously.  Requesting B12 shot: Would like to get this today so he doesn't have to come back for this nurse visit.   Review of Systems as per above   Objective:   Physical Exam  Constitutional: He is oriented to person, place, and time. He appears well-developed and well-nourished.  Cardiovascular: Normal rate and regular rhythm.   No murmur heard. Pulmonary/Chest: Effort normal and breath sounds normal. No respiratory distress. He has no wheezes.  Abdominal: Soft. He exhibits no distension. There is no tenderness. There is no rebound and no guarding.  Musculoskeletal: He exhibits no edema.  Neurological: He is alert and oriented to person, place, and time.          Assessment & Plan:

## 2011-06-05 NOTE — Assessment & Plan Note (Signed)
Now resolved.  Last antibiotic dose tomorrow.

## 2011-07-09 ENCOUNTER — Encounter: Payer: Self-pay | Admitting: Family Medicine

## 2011-07-09 ENCOUNTER — Ambulatory Visit (INDEPENDENT_AMBULATORY_CARE_PROVIDER_SITE_OTHER): Payer: Medicare Other | Admitting: Family Medicine

## 2011-07-09 DIAGNOSIS — N401 Enlarged prostate with lower urinary tract symptoms: Secondary | ICD-10-CM

## 2011-07-09 DIAGNOSIS — I495 Sick sinus syndrome: Secondary | ICD-10-CM

## 2011-07-09 DIAGNOSIS — D518 Other vitamin B12 deficiency anemias: Secondary | ICD-10-CM

## 2011-07-09 MED ORDER — RAMIPRIL 5 MG PO CAPS: 5.0000 mg | ORAL_CAPSULE | Freq: Every day | ORAL | Status: DC

## 2011-07-09 MED ORDER — HYDROCHLOROTHIAZIDE 25 MG PO TABS: ORAL_TABLET | ORAL | Status: DC

## 2011-07-09 MED ORDER — CYANOCOBALAMIN 1000 MCG/ML IJ SOLN
1000.0000 ug | Freq: Once | INTRAMUSCULAR | Status: AC
  Administered 2011-07-09: 1000 ug via INTRAMUSCULAR

## 2011-07-09 NOTE — Patient Instructions (Signed)
We have changed your HCTZ dose. If your legs start swelling go back to original dose and call me. I am getting some blood work and will send you a letter on that. Great to see you!

## 2011-07-10 LAB — COMPREHENSIVE METABOLIC PANEL
Alkaline Phosphatase: 47 U/L (ref 39–117)
BUN: 21 mg/dL (ref 6–23)
CO2: 27 mEq/L (ref 19–32)
Glucose, Bld: 126 mg/dL — ABNORMAL HIGH (ref 70–99)
Total Bilirubin: 0.5 mg/dL (ref 0.3–1.2)

## 2011-07-10 LAB — CBC
Hemoglobin: 12.6 g/dL — ABNORMAL LOW (ref 13.0–17.0)
MCH: 29.6 pg (ref 26.0–34.0)
MCV: 90.4 fL (ref 78.0–100.0)
RBC: 4.25 MIL/uL (ref 4.22–5.81)
WBC: 6.2 10*3/uL (ref 4.0–10.5)

## 2011-07-10 NOTE — Progress Notes (Signed)
  Subjective:    Patient ID: [name], male    DOB: 24-Jan-1920, 77 y.o.   MRN: [id number]  HPI  Followup recent episode of urinary hesitancy. He has restarted Flomax and is better. He has more forced his stream. He still sometimes feels like he does not totally empty his bladder and he does take a while to start his urinary stream. Overall he says he is about 75% improved. He's had no burning on urination. No pelvic or abdominal pain. Denies fever sweats or chills.  Followup cardiovascular disease with hypertension and episodic orthostasis. He occasionally feels a little lightheaded and wonders if we can decrease his fluid pill as he thinks that is what is causing the issue.  Wants his B12 shot today.  Needs some refills.  Overall feels he is doing pretty well. Says he doesn't have the energy that he used to but he still able to go out and mowed his grass with a riding mower. Review of Systems     Objective:   Physical Exam GENERALl: Well developed, well nourished, in no acute distress. NECK: Supple, FROM, without lymphadenopathy.  THYROID: normal without nodularity CAROTID ARTERIES: without bruits LUNGS: clear to auscultation bilaterally. No wheezes or rales. HEART: Regular rate and rhythm, no murmurs ABDOMEN: soft with positive bowel sounds NEURO: No gross focal deficits         Assessment & Plan:  1. Urinary hesitancy. I think he should remain on the Flomax. I did talk to him about whether or not he wanted to go back and see the urologist in right now he says he thinks she's probably as good as he is going to get. He'll let me know if things change. 2. Cardiovascular disease with hypertension, history of sick sinus syndrome and history of fairly significant orthostatic hypotension at times. We reviewed his meds and we'll change his HCTZ to 12.5 mg every other day. He'll watch for increasing lower extremity edema or increasing shortness of breath. With either of these he'll  give me a call and will go back up on his dose. He is currently taking 25 mg every other day. #3. B12 shot today #4. Chronic kidney disease stage III. I will check some labs today. Overall he is doing quite well especially given his age. Return to clinic 3 months sooner with problems.

## 2011-07-11 ENCOUNTER — Encounter: Payer: Self-pay | Admitting: Family Medicine

## 2011-07-23 ENCOUNTER — Telehealth: Payer: Self-pay | Admitting: Family Medicine

## 2011-07-23 MED ORDER — HYDROCHLOROTHIAZIDE 25 MG PO TABS: 25.0000 mg | ORAL_TABLET | Freq: Every day | ORAL | Status: DC

## 2011-07-23 NOTE — Telephone Encounter (Signed)
States that once he started new dose he started swelling and so he is now back on regular dose.

## 2011-07-23 NOTE — Telephone Encounter (Signed)
Spoke with patient and informed of below 

## 2011-07-23 NOTE — Telephone Encounter (Signed)
Spoke with pt and he stated that he went back to the way that he was taking the hctz due to swelling. Forwarded to pcp to find out if this is ok for him to do.Loralee Pacas [place]

## 2011-07-23 NOTE — Telephone Encounter (Signed)
Dear [facility] with return to original dose [facility]! Denny Levy

## 2011-08-11 ENCOUNTER — Ambulatory Visit (INDEPENDENT_AMBULATORY_CARE_PROVIDER_SITE_OTHER): Payer: Medicare Other | Admitting: *Deleted

## 2011-08-11 DIAGNOSIS — Z23 Encounter for immunization: Secondary | ICD-10-CM

## 2011-08-11 DIAGNOSIS — E538 Deficiency of other specified B group vitamins: Secondary | ICD-10-CM

## 2011-08-11 MED ORDER — CYANOCOBALAMIN 1000 MCG/ML IJ SOLN
1000.0000 ug | Freq: Once | INTRAMUSCULAR | Status: AC
  Administered 2011-08-11: 1000 ug via INTRAMUSCULAR

## 2011-08-11 NOTE — Progress Notes (Signed)
Patient in office for B12 injection and Flu vaccine. Requests  Rx written and mailed to him for Tamsulosin 0.4 mg one daily after supper # 90 day supply .will forward message to Dr. Jennette Kettle.  Address [address],  [place] , [place] [address]

## 2011-08-12 ENCOUNTER — Other Ambulatory Visit: Payer: Self-pay | Admitting: Family Medicine

## 2011-08-12 MED ORDER — TAMSULOSIN HCL 0.4 MG PO CAPS: 0.4000 mg | ORAL_CAPSULE | Freq: Every day | ORAL | Status: DC

## 2011-08-12 NOTE — Telephone Encounter (Signed)
Mailed to pt

## 2011-08-29 LAB — COMPREHENSIVE METABOLIC PANEL
ALT: 15
Albumin: 4.5
Alkaline Phosphatase: 50
BUN: 29 — ABNORMAL HIGH
Chloride: 102
Glucose, Bld: 102 — ABNORMAL HIGH
Potassium: 4.9
Total Bilirubin: 0.7

## 2011-08-29 LAB — CBC
HCT: 28.5 — ABNORMAL LOW
HCT: 29.6 — ABNORMAL LOW
HCT: 36.1 — ABNORMAL LOW
Hemoglobin: 8.5 — ABNORMAL LOW
Hemoglobin: 12.3 — ABNORMAL LOW
MCHC: 34.8
MCHC: 35.1
MCV: 88.9
MCV: 91.3
Platelets: 173
Platelets: 207
RDW: 13.9
RDW: 14
RDW: 14.8 — ABNORMAL HIGH
WBC: 6.3
WBC: 7.2

## 2011-08-29 LAB — BASIC METABOLIC PANEL
BUN: 19
BUN: 22
BUN: 26 — ABNORMAL HIGH
CO2: 22
CO2: 25
Calcium: 8.3 — ABNORMAL LOW
Chloride: 101
Chloride: 103
GFR calc non Af Amer: 44 — ABNORMAL LOW
Glucose, Bld: 114 — ABNORMAL HIGH
Glucose, Bld: 115 — ABNORMAL HIGH
Glucose, Bld: 130 — ABNORMAL HIGH
Potassium: 4.3
Potassium: 4.7
Sodium: 133 — ABNORMAL LOW

## 2011-08-29 LAB — URINALYSIS, ROUTINE W REFLEX MICROSCOPIC
Glucose, UA: NEGATIVE
Specific Gravity, Urine: 1.014
pH: 6.5

## 2011-08-29 LAB — ABO/RH: ABO/RH(D): O POS

## 2011-08-29 LAB — CROSSMATCH

## 2011-08-29 LAB — PROTIME-INR
INR: 1.6 — ABNORMAL HIGH
INR: 0.9

## 2011-09-11 ENCOUNTER — Ambulatory Visit (INDEPENDENT_AMBULATORY_CARE_PROVIDER_SITE_OTHER): Payer: Medicare Other | Admitting: *Deleted

## 2011-09-11 DIAGNOSIS — E538 Deficiency of other specified B group vitamins: Secondary | ICD-10-CM

## 2011-09-11 MED ORDER — CYANOCOBALAMIN 1000 MCG/ML IJ SOLN
1000.0000 ug | Freq: Once | INTRAMUSCULAR | Status: AC
  Administered 2011-09-11: 1000 ug via INTRAMUSCULAR

## 2011-10-01 ENCOUNTER — Ambulatory Visit (HOSPITAL_COMMUNITY)
Admission: RE | Admit: 2011-10-01 | Discharge: 2011-10-01 | Disposition: A | Discharge status: Home / Self Care | Payer: Medicare Other | Source: Ambulatory Visit | Attending: Cardiovascular Disease | Admitting: Cardiovascular Disease

## 2011-10-01 ENCOUNTER — Encounter (HOSPITAL_COMMUNITY): Payer: Self-pay | Admitting: Anesthesiology

## 2011-10-01 ENCOUNTER — Encounter (HOSPITAL_COMMUNITY): Payer: Self-pay | Admitting: Cardiovascular Disease

## 2011-10-01 ENCOUNTER — Ambulatory Visit (HOSPITAL_COMMUNITY): Payer: Medicare Other | Admitting: Anesthesiology

## 2011-10-01 ENCOUNTER — Encounter (HOSPITAL_COMMUNITY)
Admission: RE | Disposition: A | Discharge status: Home / Self Care | Payer: Self-pay | Source: Ambulatory Visit | Attending: Cardiovascular Disease

## 2011-10-01 ENCOUNTER — Other Ambulatory Visit: Payer: Self-pay

## 2011-10-01 DIAGNOSIS — I4891 Unspecified atrial fibrillation: Secondary | ICD-10-CM | POA: Insufficient documentation

## 2011-10-01 HISTORY — PX: CARDIOVERSION: SHX1299

## 2011-10-01 SURGERY — CARDIOVERSION
Anesthesia: Monitor Anesthesia Care | Wound class: Clean

## 2011-10-01 MED ORDER — HYDROCORTISONE 1 % EX CREA: 1.0000 "application " | TOPICAL_CREAM | Freq: Three times a day (TID) | CUTANEOUS | Status: DC | PRN

## 2011-10-01 MED ORDER — SODIUM CHLORIDE 0.9 % IV SOLN: 250.0000 mL | INTRAVENOUS | Status: DC

## 2011-10-01 MED ORDER — HYDROCORTISONE 1 % EX CREA
1.0000 "application " | TOPICAL_CREAM | Freq: Three times a day (TID) | CUTANEOUS | Status: DC | PRN
  Filled 2011-10-01: qty 28

## 2011-10-01 MED ORDER — PROPOFOL 10 MG/ML IV EMUL
INTRAVENOUS | Status: DC | PRN
  Administered 2011-10-01: 70 mg via INTRAVENOUS

## 2011-10-01 MED ORDER — SODIUM CHLORIDE 0.9 % IV SOLN
INTRAVENOUS | Status: DC | PRN
  Administered 2011-10-01: 13:00:00 via INTRAVENOUS

## 2011-10-01 NOTE — Anesthesia Postprocedure Evaluation (Signed)
  Anesthesia Post-op Note  Patient: [name]  Procedure(s) Performed:  CARDIOVERSION - OP CARDIOVERSION  Patient Location: PACU and Short Stay  Anesthesia Type: General  Level of Consciousness: awake and alert   Airway and Oxygen Therapy: Patient Spontanous Breathing and Patient connected to nasal cannula oxygen  Post-op Pain: none  Post-op Assessment: Post-op Vital signs reviewed and Patient's Cardiovascular Status Stable  Post-op Vital Signs: Reviewed and stable  Complications: No apparent anesthesia complications

## 2011-10-01 NOTE — Transfer of Care (Signed)
Immediate Anesthesia Transfer of Care Note  Patient: [name]  Procedure(s) Performed:  CARDIOVERSION - OP CARDIOVERSION  Patient Location: PACU and Short Stay  Anesthesia Type: General  Level of Consciousness: awake, alert  and oriented  Airway & Oxygen Therapy: Patient Spontanous Breathing and Patient connected to nasal cannula oxygen  Post-op Assessment: Report given to PACU RN, Post -op Vital signs reviewed and stable and Patient moving all extremities X 4  Post vital signs: Reviewed and stable  Complications: No apparent anesthesia complications

## 2011-10-01 NOTE — Anesthesia Procedure Notes (Addendum)
Procedure Name: MAC Performed by: Rosita Fire Pre-anesthesia Checklist: Patient identified, Emergency Drugs available, Suction available, Patient being monitored and Timeout performed Patient Re-evaluated:Patient Re-evaluated prior to inductionIntubation Type: IV induction

## 2011-10-01 NOTE — Procedures (Signed)
Elective Cardioversion:  1345:  Cardioverted at 150 J per Dr Royann Shivers  Converted NSR pt. Tolerated well.

## 2011-10-01 NOTE — H&P (Signed)
Reevaluated prior to procedure. No changes in history. No change in physical reexamination. Risks and benefits reviewed. He agrees to proceed

## 2011-10-01 NOTE — Anesthesia Preprocedure Evaluation (Addendum)
Anesthesia Evaluation  Patient identified by MRN, date of birth, ID band Patient awake    Airway Mallampati: II      Dental  (+) Partial Upper and Teeth Intact   Pulmonary shortness of breath and with exertion,          Cardiovascular hypertension, Pt. on home beta blockers Irregular Normal    Neuro/Psych  Neuromuscular disease    GI/Hepatic hiatal hernia, GERD-  Controlled,  Endo/Other  Diabetes mellitus-, Type 2  Renal/GU      Musculoskeletal   Abdominal   Peds  Hematology   Anesthesia Other Findings   Reproductive/Obstetrics                          Anesthesia Physical Anesthesia Plan  ASA: III  Anesthesia Plan: General   Post-op Pain Management:    Induction: Intravenous  Airway Management Planned: Mask  Additional Equipment:   Intra-op Plan:   Post-operative Plan:   Informed Consent: I have reviewed the patients History and Physical, chart, labs and discussed the procedure including the risks, benefits and alternatives for the proposed anesthesia with the patient or authorized representative who has indicated his/her understanding and acceptance.   Dental advisory given  Plan Discussed with: CRNA and Anesthesiologist  Anesthesia Plan Comments:         Anesthesia Quick Evaluation

## 2011-10-01 NOTE — Op Note (Signed)
Procedure: Electrical Cardioversion Indications:  Atrial Fibrillation  Procedure Details:  Consent: Risks of procedure as well as the alternatives and risks of each were explained to the (patient/caregiver).  Consent for procedure obtained.  Time Out: Verified patient identification, verified procedure, site/side was marked, verified correct patient position, special equipment/implants available, medications/allergies/relevent history reviewed, required imaging and test results available.  Performed  Patient placed on cardiac monitor, pulse oximetry, supplemental oxygen as necessary.  Sedation given: Propofol by Dr. Randa Evens, Anesthesiology Pacer pads placed anterior and posterior chest.  Cardioverted 1 time(s).  Cardioverted at 150J.biphasic, synchronized.  Evaluation: Findings: Post procedure EKG shows: Atrial paced-ventricular paced rhythm Complications: none Patient tolerated procedure well.  Time Spent Directly with the Patient:  30 minutes   [name] 10/01/2011, 1:51 PM

## 2011-10-01 NOTE — Preoperative (Signed)
Beta Blockers   Reason not to administer Beta Blockers:Not Applicable 

## 2011-10-02 ENCOUNTER — Encounter (HOSPITAL_COMMUNITY): Payer: Self-pay | Admitting: Cardiovascular Disease

## 2011-10-06 ENCOUNTER — Ambulatory Visit (INDEPENDENT_AMBULATORY_CARE_PROVIDER_SITE_OTHER): Payer: Medicare Other | Admitting: Family Medicine

## 2011-10-06 ENCOUNTER — Encounter: Payer: Self-pay | Admitting: Family Medicine

## 2011-10-06 DIAGNOSIS — M25562 Pain in left knee: Secondary | ICD-10-CM | POA: Insufficient documentation

## 2011-10-06 DIAGNOSIS — M25569 Pain in unspecified knee: Secondary | ICD-10-CM

## 2011-10-06 NOTE — Patient Instructions (Signed)
Ok to take HCTZ daily  I am glad your knee pain is getting better  If you have worsening redness or pain, please return for recheck

## 2011-10-06 NOTE — Assessment & Plan Note (Signed)
Unclear etiology, but it is reassuring that pain is mild and improving within 5 days on its own with no treatment.  Likely etiologies to include gout vs small hematoma on knee in setting of recently starting xarelto, less likely cellulitis.  Exam not consistent with blood clot, emboli, or cellulitis.  Wad admitted for similar in March 2012, although today's presentation is much  Milder with only mild pain, improving erythema, and effusion is not significantly limiting ROM.  Given that it appears to be resolving on its own, will not initiate any treatment and will monitor closely.  Advised him and his daughter in law to look for any signs of worsening pain or redness, especially calf pain or signs of skin infection.  Will take HCTZ daily for edema, and elevated legs, consider compression stockings.    If worsens would consider prednisone for treatment of gout if no appearance of cellulitis.  Cautious with other meds given CKD 3 and recent ARF this year.

## 2011-10-06 NOTE — Progress Notes (Signed)
  Subjective:    Patient ID: [name], male    DOB: Jul 25, 1920, 77 y.o.   MRN: [id number]  HPI patient presents today with his daughter-in-law for a same-day appointment for left knee pain and swelling.  Of note patient had a cardioversion 5 days ago and was started on Xarelto.  Patient states onset was 5 days ago, notes no trauma. Has history of intermittent all arthralgias and possible gout but states was never formally diagnosed and has never had an aspiration.  He noticed redness of his left knee and worsening of chronic bilateral lower extremity edema left greater than right. He he notes that his left ankle is also swollen. He states that pain is mild to moderate and has improved over the past few days with intermittent tramadol use.  He has been taking hydrochlorothiazide one tablet every other day  PMH reviewed.  Patient had similar presentation in March of 2012, was admitted, uric acid normal.  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 Review of Systems He denies any fever, chills, nausea, vomiting.   Physical Exam  GEN: Alert & Oriented, No acute distress CV:  Regular Rate & Rhythm, no murmur Respiratory:  Normal work of breathing, CTAB Abd:  + BS, soft, no tenderness to palpation Ext: bilateral lower legs with pretibial pitting edema 1+ to the level of the knee.   Left knee with mild erythema, and effusion.  Able to almost fully extend his knee- symmetrical with right knee.   Quarter sized ecchymoses on patella of left knee.  Left lower leg and ankle mildly erythematous.  2+ DP pulses, no toe discoloration.  No calf tenderness.       Assessment & Plan:

## 2011-10-13 ENCOUNTER — Ambulatory Visit (INDEPENDENT_AMBULATORY_CARE_PROVIDER_SITE_OTHER): Payer: Medicare Other | Admitting: *Deleted

## 2011-10-13 DIAGNOSIS — D518 Other vitamin B12 deficiency anemias: Secondary | ICD-10-CM

## 2011-10-13 MED ORDER — CYANOCOBALAMIN 1000 MCG/ML IJ SOLN
1000.0000 ug | Freq: Once | INTRAMUSCULAR | Status: AC
  Administered 2011-10-13: 1000 ug via INTRAMUSCULAR

## 2011-10-21 ENCOUNTER — Other Ambulatory Visit: Payer: Self-pay | Admitting: Dermatology

## 2011-10-30 ENCOUNTER — Encounter: Payer: Self-pay | Admitting: Family Medicine

## 2011-10-30 ENCOUNTER — Ambulatory Visit (INDEPENDENT_AMBULATORY_CARE_PROVIDER_SITE_OTHER): Payer: Medicare Other | Admitting: Family Medicine

## 2011-10-30 VITALS — BP 148/75 | HR 89 | Temp 98.0°F | Ht 66.0 in | Wt 185.0 lb

## 2011-10-30 DIAGNOSIS — IMO0002 Reserved for concepts with insufficient information to code with codable children: Secondary | ICD-10-CM

## 2011-10-30 DIAGNOSIS — S50319A Abrasion of unspecified elbow, initial encounter: Secondary | ICD-10-CM

## 2011-10-30 NOTE — Patient Instructions (Signed)
Patient given verbal instructions 

## 2011-10-30 NOTE — Assessment & Plan Note (Signed)
Patient's elbow abrasion shows no signs of infection and no signs of continued bleeding at this time. Patient was dressed with a Tegaderm covering at this time and an Ace bandage the patient can remove easily. Patient told to continue to keep the Tegaderm on for the next 48 hours and given a secondary one in case more as needed. Encourage patient though to not use anymore Neosporin and to allow this to heal on its own. Discussed the blood thinners with the family at this time. Per recommendations of cardiology they state that they should continue the Xarltol to indefinitely due to patient's likelihood of recovery back into atrial fibrillation. No need to stop the medication at this time due to patient not bleeding. Told patient though that the bursa likely take a long time to heal.

## 2011-10-30 NOTE — Progress Notes (Signed)
77 year old male who recently had cardioversion done about a month ago and placed on xaralto 2 on Monday morning fell out of bed while he is having a dream and hit his left elbow. This did seem to bleed but did able to stop after approximately one hour. During the course of the last 3 days though patient has continued to have some on and off bleeding of the area and some significant bruising. Patient's daughter-in-law has been covering it and using a lot more of Neosporin but it seems every time she does a dressing change it seems that the skin opened up again and he continues to bleed. Patient denies much pain able to move his elbow fine denies any fevers chills or erythema in the region. Patient also denies any type of numbness or loss of strength in the extremity. Patient also denies any shoulder pain.  Review of systems as above Past medical history reviewed  Physical exam General: No apparent distress appears stated age Patient's left elbow: Patient does have an area of abrasion shape the tear approximately 1 cm in diameter with well-healing granulation tissue around the periphery. Patient does not have any weeping or any bleeding at this time in the area. Just proximal to the elbow on the posterior aspect of the humerus patient does have a significant amount of bruising the patient is nontender patient has full range of motion in flexion extension as well as supination and pronation of the elbow. Patient is neurovascularly intact in 5 out of 5 strength distally from the elbow.

## 2011-11-03 ENCOUNTER — Ambulatory Visit
Admission: RE | Admit: 2011-11-03 | Discharge: 2011-11-03 | Disposition: A | Discharge status: Home / Self Care | Payer: Medicare Other | Source: Ambulatory Visit | Attending: Family Medicine | Admitting: Family Medicine

## 2011-11-03 ENCOUNTER — Encounter: Payer: Self-pay | Admitting: Family Medicine

## 2011-11-03 ENCOUNTER — Ambulatory Visit (INDEPENDENT_AMBULATORY_CARE_PROVIDER_SITE_OTHER): Payer: Medicare Other | Admitting: Family Medicine

## 2011-11-03 VITALS — BP 147/70 | HR 86 | Ht 66.0 in | Wt 187.0 lb

## 2011-11-03 DIAGNOSIS — M79609 Pain in unspecified limb: Secondary | ICD-10-CM

## 2011-11-03 DIAGNOSIS — M79642 Pain in left hand: Secondary | ICD-10-CM

## 2011-11-03 MED ORDER — PREDNISONE 20 MG PO TABS: 20.0000 mg | ORAL_TABLET | Freq: Every day | ORAL | Status: AC

## 2011-11-03 NOTE — Patient Instructions (Signed)
Thank you for coming in today. We will do hand and wrist xrays.  Take prednisone 20mg  1 tablet daily x 5 days.  Come back in 1 week.  I will call you if we need to change anything.

## 2011-11-04 ENCOUNTER — Encounter: Payer: Self-pay | Admitting: Family Medicine

## 2011-11-04 NOTE — Progress Notes (Signed)
[name] is a 77 yo male with a recent history of a fall and a left elbow abrasion/skin tear. He was seen last week and a pressure dressing was applied to the wound due to his bleeding on Xeralto. However the following day his left wrist begun to swell and became painful. He denies any further fall and continues to take his xeralto. No further bleeding. Feels well otherwise.   PMH reviewed.  ROS as above otherwise neg Medications reviewed. Current Outpatient Prescriptions  Medication Sig Dispense Refill  . clotrimazole (LOTRIMIN) 1 % cream Apply to affected area daily for 2 weeks.  30 g  0  . Cyanocobalamin (VITAMIN B-12 IJ) Inject as directed. Once a month       . desonide (DESOWEN) 0.05 % ointment       . hydrochlorothiazide 25 MG tablet Take 1 tablet (25 mg total) by mouth daily. Take one  tablet every other day. May increase to one whole tablet every day if lower extremity swelling or increased shortness of breath  90 tablet  3  . hydrocortisone 1 % cream Apply 1 application topically 3 (three) times daily as needed (skin irritation).  30 g  0  . nebivolol (BYSTOLIC) 10 MG tablet Take 10 mg by mouth daily.        . pantoprazole (PROTONIX) 40 MG tablet Take 40 mg by mouth daily.        . predniSONE (DELTASONE) 20 MG tablet Take 1 tablet (20 mg total) by mouth daily.  5 tablet  0  . Probiotic Product (PHILLIPS COLON HEALTH PO) Take by mouth daily.        . ramipril (ALTACE) 5 MG capsule Take 1 capsule (5 mg total) by mouth daily. Generic preferred  90 capsule  3  . Rivaroxaban (XARELTO) 15 MG TABS tablet Take 15 mg by mouth daily.        . Tamsulosin HCl (FLOMAX) 0.4 MG CAPS Take 1 capsule (0.4 mg total) by mouth daily.  90 capsule  3  . traMADol (ULTRAM) 50 MG tablet Take 1 tablet (50 mg total) by mouth every 8 (eight) hours as needed for pain.  90 tablet  5    Exam:  BP 147/70  Pulse 86  Ht 5\' 6"  (1.676 m)  Wt 187 lb (84.823 kg)  BMI 30.18 kg/m2 Gen: Well NAD MSK; Swelling of the  left hand and wrist. Fingers are NVI. TTP ulnar and dorsal wrist. Reduced ROM of wrist due to pain.

## 2011-11-04 NOTE — Assessment & Plan Note (Signed)
DDX for Osteoarthritis, gout, Fx or hemarthrosis.  Will eval with Xray to show no bony fracture. Will provide mild to moderate pain medication and follow up in clinic in 1 week or sooner if needed. Aspiration contraindicated currently due to anticoagulation. Will follow. Red Flags reviewed with pt and family who express understanding.

## 2011-11-12 ENCOUNTER — Encounter: Payer: Self-pay | Admitting: Family Medicine

## 2011-11-12 ENCOUNTER — Ambulatory Visit (INDEPENDENT_AMBULATORY_CARE_PROVIDER_SITE_OTHER): Payer: Medicare Other | Admitting: Family Medicine

## 2011-11-12 DIAGNOSIS — E538 Deficiency of other specified B group vitamins: Secondary | ICD-10-CM

## 2011-11-12 DIAGNOSIS — M79609 Pain in unspecified limb: Secondary | ICD-10-CM

## 2011-11-12 DIAGNOSIS — M79642 Pain in left hand: Secondary | ICD-10-CM

## 2011-11-12 MED ORDER — CYANOCOBALAMIN 1000 MCG/ML IJ SOLN
1000.0000 ug | Freq: Once | INTRAMUSCULAR | Status: AC
  Administered 2011-11-12: 1000 ug via INTRAMUSCULAR

## 2011-11-12 NOTE — Progress Notes (Signed)
Addended by: Orvil Feil L on: 11/12/2011 03:01 PM   Modules accepted: Orders

## 2011-11-12 NOTE — Assessment & Plan Note (Signed)
Doing well today. Still has some residual swelling. I have asked the patient to followup with his primary care doctor in one month. I provided him red flag signs or symptoms.  He expresses understanding.

## 2011-11-12 NOTE — Patient Instructions (Signed)
Thank you for coming in today. Please see Dr. Jennette Kettle in 1 month.  Come back sooner if you get worse or the pain comes back or that finger gets really hot and painful.

## 2011-11-12 NOTE — Progress Notes (Signed)
[name] presents to clinic today to followup with his left hand and wrist pain. In the interval he completed his prednisone dose.  His wrist and hand pain has resolved however he does note continued fifth metacarpophalangeal joint swelling and proximal phalangeal joint swelling.  This is nonpainful and not red or hot.  He feels well.  PMH reviewed.  ROS as above otherwise neg Medications reviewed. Current Outpatient Prescriptions  Medication Sig Dispense Refill  . clotrimazole (LOTRIMIN) 1 % cream Apply to affected area daily for 2 weeks.  30 g  0  . Cyanocobalamin (VITAMIN B-12 IJ) Inject as directed. Once a month       . desonide (DESOWEN) 0.05 % ointment       . hydrochlorothiazide 25 MG tablet Take 1 tablet (25 mg total) by mouth daily. Take one  tablet every other day. May increase to one whole tablet every day if lower extremity swelling or increased shortness of breath  90 tablet  3  . hydrocortisone 1 % cream Apply 1 application topically 3 (three) times daily as needed (skin irritation).  30 g  0  . nebivolol (BYSTOLIC) 10 MG tablet Take 10 mg by mouth daily.        . pantoprazole (PROTONIX) 40 MG tablet Take 40 mg by mouth daily.        . predniSONE (DELTASONE) 20 MG tablet Take 1 tablet (20 mg total) by mouth daily.  5 tablet  0  . Probiotic Product (PHILLIPS COLON HEALTH PO) Take by mouth daily.        . ramipril (ALTACE) 5 MG capsule Take 1 capsule (5 mg total) by mouth daily. Generic preferred  90 capsule  3  . Rivaroxaban (XARELTO) 15 MG TABS tablet Take 15 mg by mouth daily.        . Tamsulosin HCl (FLOMAX) 0.4 MG CAPS Take 1 capsule (0.4 mg total) by mouth daily.  90 capsule  3  . traMADol (ULTRAM) 50 MG tablet Take 1 tablet (50 mg total) by mouth every 8 (eight) hours as needed for pain.  90 tablet  5    Exam:  BP 146/84  Pulse 80  Temp(Src) 97.5 F (36.4 C) (Oral)  Ht 5\' 5"  (1.651 m)  Wt 183 lb (83.008 kg)  BMI 30.45 kg/m2 Gen: Well NAD MSK: Left wrist with normal  range of motion and no effusion. Left fifth metacarpophalangeal joint with mild swelling and left fifth proximal phalangeal swelling without pain  Normal hearing range of motion.

## 2011-12-10 ENCOUNTER — Ambulatory Visit (INDEPENDENT_AMBULATORY_CARE_PROVIDER_SITE_OTHER): Payer: Medicare Other | Admitting: Family Medicine

## 2011-12-10 VITALS — BP 169/84 | HR 82 | Temp 98.7°F | Wt 183.0 lb

## 2011-12-10 DIAGNOSIS — I495 Sick sinus syndrome: Secondary | ICD-10-CM

## 2011-12-10 DIAGNOSIS — D518 Other vitamin B12 deficiency anemias: Secondary | ICD-10-CM

## 2011-12-10 DIAGNOSIS — I10 Essential (primary) hypertension: Secondary | ICD-10-CM

## 2011-12-10 DIAGNOSIS — N183 Chronic kidney disease, stage 3 unspecified: Secondary | ICD-10-CM

## 2011-12-10 MED ORDER — NEBIVOLOL HCL 10 MG PO TABS: 10.0000 mg | ORAL_TABLET | Freq: Every day | ORAL | Status: DC

## 2011-12-11 ENCOUNTER — Encounter: Payer: Self-pay | Admitting: Family Medicine

## 2011-12-11 LAB — BASIC METABOLIC PANEL
CO2: 28 mEq/L (ref 19–32)
Calcium: 9.6 mg/dL (ref 8.4–10.5)
Chloride: 97 mEq/L (ref 96–112)
Creat: 1.15 mg/dL (ref 0.50–1.35)
Glucose, Bld: 104 mg/dL — ABNORMAL HIGH (ref 70–99)

## 2011-12-11 NOTE — Progress Notes (Signed)
  Subjective:    Patient ID: DOCTOR SHEAHAN, male    DOB: 02-Jan-1920, 78 y.o.   MRN: [id number]  HPI  #1. Followup hypertension with known heart disease. He brings his blood pressure readings. He is occasionally in the 150-170 range systolic. Diastolics are usually in the mid 80s to low 90s. He has had some episodes of lightheadedness particularly after standing quickly. They usually resolve within 30 seconds. He denies chest pains, denies unusual shortness of breath with exertion. His energy level is about the same. He's not had any lower extremity swelling. He is having no problems with his medicines. He does need some refills.  #2. Followup left hand pain. Says is almost totally resolved except for the stiffness and the little finger. The pain is really much better. He is occasionally using tramadol for her neck seems to help.  Review of Systems Denies fever, sweats, chills. Denies unusual weight change. Please see history of present illness above for additional pertinent review of systems.    Objective:   Physical Exam Vital signs reviewed GENERALl: Well developed, well nourished, in no acute distress. NECK: Supple, FROM, without lymphadenopathy.  THYROID: normal without nodularity CAROTID ARTERIES: without bruits LUNGS: clear to auscultation bilaterally. No wheezes or rales. HEART: Regular rate and rhythm, no murmurs ABDOMEN: soft with positive bowel sounds NEURO: No gross focal deficits except decreased hearing.         Assessment & Plan:  #1. Hypertension. I would not increase his hypertensive medications at this time. I think his orthostatic hypertension could become problematic. I did refill his medications. #2. Left hand pain with some underlying arthritis. Seems to be improving. We'll continue to follow. #3. Low B12: We'll give him his injection today.  #4 chronic renal insufficiency. We'll check his creatinine again today. He is doing remarkably well given his underlying  medical conditions and his age. I'll see him back in 3 months, sooner if problems.

## 2011-12-15 ENCOUNTER — Encounter: Payer: Self-pay | Admitting: Family Medicine

## 2012-01-16 ENCOUNTER — Ambulatory Visit (INDEPENDENT_AMBULATORY_CARE_PROVIDER_SITE_OTHER): Payer: Medicare Other | Admitting: *Deleted

## 2012-01-16 DIAGNOSIS — E538 Deficiency of other specified B group vitamins: Secondary | ICD-10-CM

## 2012-01-16 MED ORDER — CYANOCOBALAMIN 1000 MCG/ML IJ SOLN
1000.0000 ug | Freq: Once | INTRAMUSCULAR | Status: AC
  Administered 2012-01-16: 1000 ug via INTRAMUSCULAR

## 2012-01-20 ENCOUNTER — Ambulatory Visit: Payer: Medicare Other

## 2012-01-20 ENCOUNTER — Ambulatory Visit (INDEPENDENT_AMBULATORY_CARE_PROVIDER_SITE_OTHER): Payer: Medicare Other | Admitting: Family Medicine

## 2012-01-20 ENCOUNTER — Telehealth: Payer: Self-pay | Admitting: *Deleted

## 2012-01-20 VITALS — BP 140/72 | HR 88 | Temp 97.7°F | Ht 67.0 in | Wt 186.7 lb

## 2012-01-20 DIAGNOSIS — R1310 Dysphagia, unspecified: Secondary | ICD-10-CM

## 2012-01-20 MED ORDER — PANTOPRAZOLE SODIUM 40 MG PO TBEC: 40.0000 mg | DELAYED_RELEASE_TABLET | Freq: Every day | ORAL | Status: DC

## 2012-01-20 NOTE — Telephone Encounter (Signed)
Per Dr Jennette Kettle pt needs to be seen called pts home and left message to call back I have made an appt for pt on 01/30/2012 10:45am. This is the only appt that Dr Jarold Motto had open within the next two weeks.

## 2012-01-20 NOTE — Telephone Encounter (Signed)
Pt aware, he will be here

## 2012-01-21 ENCOUNTER — Encounter: Payer: Self-pay | Admitting: Family Medicine

## 2012-01-21 NOTE — Progress Notes (Signed)
  Subjective:    Patient ID: [name], male    DOB: Sep 18, 1920, 78 y.o.   MRN: [id number]  HPI  Worsening swallowing problems---has sensation of food getting stuck every time he eats. Has long hx of dry mouth and that also makes this worse. Some occasional choking on food---bread seems to be worst problem. Occ will cough up bits of food.  Needs some refills PERTINENT  PMH / PSH: Had endoscopy and manometry last summer ([name])  Review of Systems Pertinent review of systems: negative for fever or unusual weight change.     Objective:   Physical Exam  Vital signs reviewed. GENERAL: Well-developed, well-nourished, no acute distress. CARDIOVASCULAR: Regular rate and rhythm no murmur gallop or rub LUNGS: Clear to auscultation bilaterally, no rales or wheeze. ABDOMEN: Soft positive bowel sounds NEURO: No gross focal neurological deficits. MSK: Movement of extremity x 4.        Assessment & Plan:  Dysphagia--spoke with [name] while [name] was in teh office--[name] office will call him for appt in next 2 days. Med refills

## 2012-01-30 ENCOUNTER — Other Ambulatory Visit (HOSPITAL_COMMUNITY): Payer: Self-pay | Admitting: Gastroenterology

## 2012-01-30 ENCOUNTER — Other Ambulatory Visit (HOSPITAL_COMMUNITY): Payer: Self-pay | Admitting: Family Medicine

## 2012-01-30 ENCOUNTER — Encounter: Payer: Self-pay | Admitting: Gastroenterology

## 2012-01-30 ENCOUNTER — Ambulatory Visit (INDEPENDENT_AMBULATORY_CARE_PROVIDER_SITE_OTHER): Payer: Medicare Other | Admitting: Gastroenterology

## 2012-01-30 DIAGNOSIS — I4891 Unspecified atrial fibrillation: Secondary | ICD-10-CM

## 2012-01-30 DIAGNOSIS — Z8719 Personal history of other diseases of the digestive system: Secondary | ICD-10-CM

## 2012-01-30 DIAGNOSIS — R1310 Dysphagia, unspecified: Secondary | ICD-10-CM | POA: Insufficient documentation

## 2012-01-30 DIAGNOSIS — I480 Paroxysmal atrial fibrillation: Secondary | ICD-10-CM | POA: Insufficient documentation

## 2012-01-30 DIAGNOSIS — Z7901 Long term (current) use of anticoagulants: Secondary | ICD-10-CM

## 2012-01-30 DIAGNOSIS — Z9889 Other specified postprocedural states: Secondary | ICD-10-CM

## 2012-01-30 DIAGNOSIS — Z9049 Acquired absence of other specified parts of digestive tract: Secondary | ICD-10-CM

## 2012-01-30 DIAGNOSIS — R160 Hepatomegaly, not elsewhere classified: Secondary | ICD-10-CM | POA: Insufficient documentation

## 2012-01-30 NOTE — Patient Instructions (Signed)
Your Ultrasound is scheduled on Monday 02/02/2012 at 11:15 to arrive, [facility]  Nothing to eat or drink after midnight Your Barium Swallow is scheduled on 02/06/2012 at 10am to arrive at 9:45am at [facility] radiology

## 2012-01-30 NOTE — Progress Notes (Signed)
This is a very nice 78 year old Caucasian male with recent onset atrial flutter ablation requiring Xarelto 50 mg a day initiation. He apparently also had cardioversion. He has had several years of unexplained dysphagia for solid foods with negative endoscopic exams, no response to dilation, and a normal esophageal manometry. Continues to complain of" thick spit" of material in his upper esophageal area, no emesis, weight loss, nausea vomiting, or acid reflux symptoms on Protonix 20 mg a day. He has been admitted twice over the last several years with small bowel obstruction of unexplained etiology, and has responded to nasogastric suction and bowel rest. He is status post appendectomy. Currently, he denies bowel or irregularity, melena, hematochezia, systemic or hepatobiliary complaints. Up-to-date on his endoscopy and colonoscopy exams. His daughter-in-law was with him today during his interview and exam.  Current Medications, Allergies, Past Medical History, Past Surgical History, Family History and Social History were reviewed in [employer] record.  Pertinent Review of Systems Negative   Physical Exam: Healthy appearing patient in no distress. Blood pressure 122/64 and pulse 60 and regular. BMI is 29.70. His amylase and oropharynx and neck areas are unremarkable. His chest is clear and he appears to be in a regular rhythm without murmurs gallops or rubs. There does appear to be a fullness in the right upper quadrant area which is nontender. I cannot appreciate any other organomegaly, masses or tenderness. There is no evidence of ascites. All sounds are normal. Mental status is normal, peripheral extremities are unremarkable.    Assessment and Plan: Difficult situation of unexplained dysphagia despite multiple therapeutic and diagnostic tests. I've scheduled him to have barium swallow with speech pathology in attendance, also schedule right upper quadrant ultrasound exam, and  we'll continue medications as listed and reviewed. CT scan of the abdomen one year ago he showed granulomatous calcifications in the liver and spleen, diverticulosis, and fatty infiltration of his pancreas. I will see him back in one month's time for followup. He may need referral to [facility] motility clinic. Encounter Diagnoses  Name Primary?  Marland Kitchen Dysphagia Yes  . Hepatomegaly

## 2012-02-02 ENCOUNTER — Ambulatory Visit (HOSPITAL_COMMUNITY)
Admission: RE | Admit: 2012-02-02 | Discharge: 2012-02-02 | Disposition: A | Discharge status: Home / Self Care | Payer: Medicare Other | Source: Ambulatory Visit | Attending: Gastroenterology | Admitting: Gastroenterology

## 2012-02-02 DIAGNOSIS — R1310 Dysphagia, unspecified: Secondary | ICD-10-CM | POA: Insufficient documentation

## 2012-02-02 DIAGNOSIS — R160 Hepatomegaly, not elsewhere classified: Secondary | ICD-10-CM

## 2012-02-06 ENCOUNTER — Ambulatory Visit (HOSPITAL_COMMUNITY)
Admission: RE | Admit: 2012-02-06 | Discharge: 2012-02-06 | Disposition: A | Discharge status: Home / Self Care | Payer: Medicare Other | Source: Ambulatory Visit | Attending: Gastroenterology | Admitting: Gastroenterology

## 2012-02-06 DIAGNOSIS — R1313 Dysphagia, pharyngeal phase: Secondary | ICD-10-CM | POA: Insufficient documentation

## 2012-02-06 DIAGNOSIS — E119 Type 2 diabetes mellitus without complications: Secondary | ICD-10-CM | POA: Insufficient documentation

## 2012-02-06 DIAGNOSIS — R1319 Other dysphagia: Secondary | ICD-10-CM | POA: Insufficient documentation

## 2012-02-06 DIAGNOSIS — Z79899 Other long term (current) drug therapy: Secondary | ICD-10-CM | POA: Insufficient documentation

## 2012-02-06 DIAGNOSIS — K449 Diaphragmatic hernia without obstruction or gangrene: Secondary | ICD-10-CM | POA: Insufficient documentation

## 2012-02-06 NOTE — Procedures (Addendum)
Modified Barium Swallow Procedure Note Patient Details  Name: [name] MRN: [id number] Date of Birth: 1920/09/10  Today's Date: 02/06/2012 Time: 1035 - 1145    Past Medical History:  Past Medical History  Diagnosis Date  . Overactive bladder   . Arthritis   . Diverticulosis of colon (without mention of hemorrhage)   . Gout   . Chronic kidney disease, stage III (moderate)   . Family history of malignant neoplasm of gastrointestinal tract   . Stricture and stenosis of esophagus   . Acute gastritis without mention of hemorrhage   . Unspecified cataract   . Hypertrophy of prostate with urinary obstruction and other lower urinary tract symptoms (LUTS)   . Personal history of colonic polyps 10/21/2006    hyperplastic   . Anemia   . Vitamin B12 deficiency   . Hiatal hernia   . Type II or unspecified type diabetes mellitus without mention of complication, not stated as uncontrolled     pt states that he checks his blood sugar once weekly, is not on oral meds or insulin   Past Surgical History:  Past Surgical History  Procedure Date  . Appendectomy   . Tonsillectomy   . Pacemaker insertion   . Total hip arthroplasty   . Cardioversion 10/01/2011    Procedure: CARDIOVERSION;  Surgeon: Rachelle Hora [name];  Location: [facility] OR;  Service: Cardiovascular;  Laterality: N/A;  OP CARDIOVERSION   HPI:  78 yo male referred by Dr Jarold Motto for MBS due to pt complaint of sensation of stasis in pharynx with foods requiring him to cough and expectorate food mixed with secretions during meals.  Pt states this occurs once to twice a day usually in the middle of the meal, but can also occur after meal and may contribute to hoarseness.  Pt denies weight loss or pulmonary infections.  He further states this has occured for approx one year with mild worsening.  Pt PMH + for Afib s/p cardioversion,  SBO (last Spring and year prior per pt), previous smoker quit in 1971, recurrent acid reflux with peptic  strictures.  Pt has undergone esophageal manometry 05/26/11 with Dr Jarold Motto with normal results and has undergone dilatation (last done 03/2010).  Pt reports he has undergone several dilatations, ? with transient relief.   Xerostomia is also an issue for this pt with viscous saliva, pt uses biotene rinse (SLP provided written tips for compensation).   Generic protonix and a probiotic have been taken for several years per pt.    Pt also acknowledges occasional sensation of "tightness" in the throat that is alleviated with drinking and eating- causing SLP to question if he has some component of CP HTN.       Recommendation/Prognosis  Clinical Impression Dysphagia Diagnosis: Mild oral phase dysphagia;Moderate pharyngeal phase dysphagia;Mild cervical esophageal phase dysphagia Clinical impression: Pt appears wtih mild oral and moderate pharyngeal dysphagia mostly characterized by decr tongue base retraction with stasis in pharynx in various area.   Pt did not aspirate or overtly penetrate with ANY consistency tested, but he is at risk due to stasis.    Stasis retained in various areas of pharynx including:  Superior side of epiglottis with puree, vallecular space and posterior pharynx with cracker transiting to pyriform with dry swallow, and at vallecula/pyriform with liquids).  Pt sensed large amount of stasis and liquids aided clearance of solids, but he did NOT sense mild amount of stasis with liquids.  Head turn LEFT appeared to aid clearance and pt  reported improved sensation of clearance with this posture, but was not consistent with every swallow.  Head turn right and chin tuck also attempted but without overt improvement.  Pt also with coating along oral and pharyngeal tongue with pudding due to weak TBR.    Most effective strategies for decreasing stasis appeared to be head turn LEFT, following solids with liquids and intermittent dry swallows.   SLP suspects pt with multifactorial dysphagia  including oropharyngeal component and possible UES involvement.  Please note pt with palatal deviation to right upon phonation, ? vagal nerve involvement.  SLP questions source of dypsphagia given pt denial of neuro events and appearance of vagal nerve deficit.  Also question if pt would benefit from further assessment of UES function, (note his manometry eval was normal 05/2011).    Recommend pt use caution with eating, start meals with WATER to moisten dry mucosal tissues, follow EVERY bites of well masticated solid with water, and conduct intermittent dry swallows to aid clearance.  Further recommended pt take rest breaks if senses stasis that won't clear or he is overtly coughing.  Pt may benefit from consuming several small meals/day to decrease episodes of aspiration.  Daughter in law states pt eats at rapid rate, SLP advised pt to slow rate and use strategies to maximize airway protection.  Pt denies pnas or weight loss and therefore appears to be tolerating suspected aspiration events.  Pt is ambulatory with use of cane and reportedly cleans his own home and mows his yard, his mobility and activity likely aid his tolerance of suspected aspiration events.      Swallow Evaluation Recommendations Solid Consistency: Dysphagia 3 (Mechanical soft) Liquid Consistency: Thin (WATER) Liquid Administration via: Cup Medication Administration: Crushed with puree Supervision: Patient able to self feed Compensations: Slow rate;Small sips/bites;Follow solids with liquid (intermittent dry swallow, dtr in law states pt eats rapidly) Postural Changes and/or Swallow Maneuvers: Seated upright 90 degrees;Upright 30-60 min after meal;Head turn left during swallow   Individuals Consulted Consulted and Agree with Results and Recommendations: Patient;Family member/caregiver Family Member Consulted: Dtr in law [name] Report Sent to : Referring physician  General:  Date of Onset: 02/06/12 HPI: 78 yo male referred by  Dr Jarold Motto for MBS due to pt complaint of sensation of stasis in pharynx with foods requiring him to cough and expectorate food mixed with secretions during meals.  Pt states this occurs once to twice a day usually in the middle of the meal, but can also occur after meal and may contribute to hoarseness.  Pt denies weight loss or pulmonary infections.  He further states this has occured for approx one year with mild worsening.  Pt PMH + for Afib s/p cardioversion,  SBO (last Spring and year prior per pt), previous smoker quit in 1971, recurrent acid reflux with peptic strictures.  Pt has undergone esophagal manometry 05/26/11 with Dr Jarold Motto with normal results and has undergone dilatation (last done 03/2010).  Pt reports he has undergone several dilatations, ? with transient relief.   Xerostomia is also an issue for this pt with viscous saliva, pt uses biotene rinse.   Generic protonix and a probiotic have been taken for several years per pt.    Pt also acknowledges occasional sensation of "tighness" in the throat that is alleviated with drinking and eating- causing SLP to question if he has some component of CP HTN.   Diet Prior to this Study: Regular Temperature Spikes Noted: No Respiratory Status: Room air History of Intubation:  No Behavior/Cognition: Alert;Cooperative;Pleasant mood Oral Cavity - Dentition: Dentures, top;Adequate natural dentition Oral Motor / Sensory Function: Impaired motor Oral impairment: Other (Comment) (palatal deviation to right upon phonation, ? vagal nerve ) Vision: Functional for self-feeding Patient Positioning: Postural control adequate for testing Baseline Vocal Quality: Hoarse Volitional Cough: Strong Volitional Swallow: Able to elicit Anatomy: Within functional limits Pharyngeal Secretions: Not observed secondary MBS  Reason for Referral:  Pt c/o dysphagia, food stasis in pharynx, concern for aspiration  Oral Phase Oral Preparation/Oral Phase Oral Phase:  Impaired Oral Phase - Comment Oral Phase - Comment: Pt took small bites of cracker, masticating and adding additional bites to mouth before swallowing bolus.  [name] *dtr in law* present said he would normally put entire cracker in his mouth.  .   Pharyngeal Phase    Pharyngeal Phase Pharyngeal Phase: Impaired Pharyngeal - Nectar Pharyngeal - Nectar Cup: Pharyngeal residue - cp segment;Pharyngeal residue - valleculae;Pharyngeal residue - pyriform;Reduced tongue base retraction;Reduced epiglottic inversion;Compensatory strategies attempted (Comment) (dry swallow decr stasis) Pharyngeal - Thin Pharyngeal - Thin Cup: Pharyngeal residue - cp segment;Pharyngeal residue - pyriform;Pharyngeal residue - valleculae;Reduced tongue base retraction;Reduced epiglottic inversion;Compensatory strategies attempted (Comment);Other (Comment) (piecemeal functional, dry swallow decr stasis) Pharyngeal - Solids Pharyngeal - Puree: Pharyngeal residue - cp segment;Reduced tongue base retraction;Reduced epiglottic inversion;Pharyngeal residue - pyriform;Compensatory strategies attempted (Comment);Other (Comment) (thin liquid swallow decr pudding stasis on epiglottis) Pharyngeal - Regular: Pharyngeal residue - cp segment;Pharyngeal residue - posterior pharnyx;Reduced epiglottic inversion;Pharyngeal residue - pyriform;Compensatory strategies attempted (Comment) (liquid swallow aided clearance of moderate stasis) Pharyngeal Phase - Comment Pharyngeal Comment: head turn left may decr residuals, but did not appear to be consistent, pt reported improvement with swallow with head turn, chin tuck and head turn right did not aid swallow per pt SENSATION Cervical Esophageal Phase  ? Appearance of cervical osteophyte type structure at C5-C6, C6-C7, radiologist not present to confirm Clearance of esophagus appeared adequate, pt did appear with ? Tertiary contractions distally with mild retrograde propulsion with thin liquids.    Cervical Esophageal Phase Cervical Esophageal Phase: Impaired Cervical Esophageal Phase - Comment Cervical Esophageal Comment: ? if pt has crircopharyngeal hypertonicity based on symptoms and stasis  Donavan Burnet, MS [facility] SLP [phone]

## 2012-02-16 ENCOUNTER — Ambulatory Visit (INDEPENDENT_AMBULATORY_CARE_PROVIDER_SITE_OTHER): Payer: Medicare Other | Admitting: *Deleted

## 2012-02-16 DIAGNOSIS — E538 Deficiency of other specified B group vitamins: Secondary | ICD-10-CM

## 2012-02-16 MED ORDER — CYANOCOBALAMIN 1000 MCG/ML IJ SOLN
1000.0000 ug | Freq: Once | INTRAMUSCULAR | Status: AC
  Administered 2012-02-16: 1000 ug via INTRAMUSCULAR

## 2012-02-17 ENCOUNTER — Ambulatory Visit (INDEPENDENT_AMBULATORY_CARE_PROVIDER_SITE_OTHER): Payer: Medicare Other | Admitting: Gastroenterology

## 2012-02-17 ENCOUNTER — Encounter: Payer: Self-pay | Admitting: Gastroenterology

## 2012-02-17 VITALS — BP 136/70 | HR 68 | Ht 66.0 in | Wt 187.4 lb

## 2012-02-17 DIAGNOSIS — R1312 Dysphagia, oropharyngeal phase: Secondary | ICD-10-CM

## 2012-02-17 DIAGNOSIS — Z8719 Personal history of other diseases of the digestive system: Secondary | ICD-10-CM

## 2012-02-17 NOTE — Progress Notes (Signed)
History of Present Illness: This is a very complex 78 year old Caucasian male who appears to have neurogenic dysphagia confirmed by recent barium swallow with speech pathology. He currently is doing much better with his swallowing difficulties following their recommendations. He also has him using Biotene mouthwash with good success. Ultrasound exam showed mild hepatomegaly but no mass lesions or other abnormalities. Review of his labs shows normal liver function tests. The patient does have acid reflux additionally that is controlled daily PPI therapy. The patient's wife is with him today and we reviewed all of his findings in detail.    Current Medications, Allergies, Past Medical History, Past Surgical History, Family History and Social History were reviewed in [employer] record.   Assessment and plan: Neurogenic dysphagia markedly improved with artificial spit and physical maneuvers as per speech pathology excellent workup. This patient has had endoscopy and manometry which otherwise have been unremarkable. We will continue his daily PPI therapy with when necessary followup as needed. No diagnosis found.

## 2012-02-17 NOTE — Patient Instructions (Addendum)
Follow up once a year.

## 2012-02-18 ENCOUNTER — Ambulatory Visit: Payer: Medicare Other | Admitting: Family Medicine

## 2012-02-20 ENCOUNTER — Encounter: Payer: Self-pay | Admitting: Family Medicine

## 2012-03-17 ENCOUNTER — Ambulatory Visit (INDEPENDENT_AMBULATORY_CARE_PROVIDER_SITE_OTHER): Payer: Medicare Other | Admitting: *Deleted

## 2012-03-17 DIAGNOSIS — E538 Deficiency of other specified B group vitamins: Secondary | ICD-10-CM

## 2012-03-17 MED ORDER — CYANOCOBALAMIN 1000 MCG/ML IJ SOLN
1000.0000 ug | Freq: Once | INTRAMUSCULAR | Status: AC
  Administered 2012-03-17: 1000 ug via INTRAMUSCULAR

## 2012-03-31 ENCOUNTER — Telehealth: Payer: Self-pay | Admitting: Family Medicine

## 2012-03-31 ENCOUNTER — Ambulatory Visit (INDEPENDENT_AMBULATORY_CARE_PROVIDER_SITE_OTHER): Payer: Medicare Other | Admitting: Family Medicine

## 2012-03-31 ENCOUNTER — Encounter: Payer: Self-pay | Admitting: Family Medicine

## 2012-03-31 DIAGNOSIS — N453 Epididymo-orchitis: Secondary | ICD-10-CM

## 2012-03-31 DIAGNOSIS — M25579 Pain in unspecified ankle and joints of unspecified foot: Secondary | ICD-10-CM

## 2012-03-31 MED ORDER — COLCHICINE 0.6 MG PO TABS: ORAL_TABLET | ORAL | Status: DC

## 2012-03-31 MED ORDER — CEFTRIAXONE SODIUM 250 MG IJ SOLR
250.0000 mg | Freq: Once | INTRAMUSCULAR | Status: AC
  Administered 2012-03-31: 250 mg via INTRAMUSCULAR

## 2012-03-31 MED ORDER — DOXYCYCLINE HYCLATE 100 MG PO CAPS: 100.0000 mg | ORAL_CAPSULE | Freq: Two times a day (BID) | ORAL | Status: AC

## 2012-03-31 NOTE — Progress Notes (Signed)
  Subjective:    Patient ID: [name], male    DOB: 1920-10-28, 78 y.o.   MRN: [id number]  HPI  2-3 days of right ankle pain. It is swollen and red. No specific injury. Is similar to some of the joint issues he's had in the past but does not feel like that time he had gout. Is different from his gout in that is is not as sensitive to touch. No numbness in the foot. Denies fever.  Second issue is right testicular swelling with very mild pain. That's been going on about a week or week and a half. He's had no penile discharge. He has no new sex partners in fact has not had intercourse in several years. He would be interested in medications for erectile dysfunction however.  Here with his daighter in Social worker. Review of Systems Denies fever, sweats, chills. Has had no urinary burning or urinary problems.    Objective:   Physical Exam  Vital signs reviewed. GENERAL: Well developed, well nourished, no acute distress RIGHT ANKLE: Tender to palpation diffusely. There is very mild erythema. It is swollen with trace edema. The toes are warm and have normal capillary refill. He has normal range of motion of toes. GU: Right testicle has small amount of fluid noticed in the scrotal sac. The testicle is well-defined and without tenderness or nodularity. Left testicle is normal. There is no increased warmth and there is no redness of the scrotum. The epididymis is nontender. Penis is benign without urethral discharge. No LAD in groin.      Assessment & Plan:  #1. Ankle pain right acute to subacute. Unclear if this is an atypical gout, pseudogout. He has had several episodes a similar joint pains once in his wrist a couple of times in his knees. They have never really responded to intra-articular corticosteroid injection. We will try cold wrists today, elevation, continued protected weightbearing with crutches. Like to see him in followup on Monday (6 days). I had Dr. Alvester Morin see him today as well as he we  following up with Dr. Earl Lagos as I am out oftown next week. 2. Will hold his HydroDIURIL today. Unclear if this is contributing to his issues. His blood pressure is extremely well controlled. I think he can do without this for a few days ultimately he might need to come off this medication. In the past we've taken him off his diuretics she's become fluid overloaded so he would need some other type of diuretic therapy. #3. Right testicular swelling with very mild pain. As is not sexually active am unclear the etiology of this. I will treat him empirically for nongonococcal urethritis/orchitis. He has followup Monday.Ceftriaxone IM 250 given on clinic. Start 10 days doxycicline. We can further discuss treatment of erectile dysfunction on his return visit.

## 2012-03-31 NOTE — Telephone Encounter (Signed)
Called and informed of rx being sent.[name]  

## 2012-03-31 NOTE — Telephone Encounter (Signed)
Mrs. [name] calling to say the pharmacy still haven't received the rx for the doxycycline and the colcrys.  Please forward to them and let her know when sent.  You can reach her at [phone].  Please send to [facility]

## 2012-03-31 NOTE — Patient Instructions (Addendum)
HOLD your hydrodiuril---(do not take for next  Few days) Start the colcrys one tab twice a day for ankle pain---take this for 3-5 days. Take the doxycicline twice a day for 10 days Please see DR Alvester Morin on MOnday for a follow up

## 2012-03-31 NOTE — Telephone Encounter (Signed)
Dear Clif[facility] A[facility] Team This has been done Please let her know they should be there now [facility]! Denny Levy

## 2012-04-05 ENCOUNTER — Ambulatory Visit (INDEPENDENT_AMBULATORY_CARE_PROVIDER_SITE_OTHER): Payer: Medicare Other | Admitting: Family Medicine

## 2012-04-05 VITALS — BP 178/90 | HR 81 | Ht 66.0 in | Wt 183.0 lb

## 2012-04-05 DIAGNOSIS — M25571 Pain in right ankle and joints of right foot: Secondary | ICD-10-CM | POA: Insufficient documentation

## 2012-04-05 DIAGNOSIS — M25579 Pain in unspecified ankle and joints of unspecified foot: Secondary | ICD-10-CM

## 2012-04-05 DIAGNOSIS — I10 Essential (primary) hypertension: Secondary | ICD-10-CM

## 2012-04-05 DIAGNOSIS — N452 Orchitis: Secondary | ICD-10-CM

## 2012-04-05 DIAGNOSIS — N453 Epididymo-orchitis: Secondary | ICD-10-CM

## 2012-04-05 MED ORDER — RAMIPRIL 10 MG PO CAPS: 10.0000 mg | ORAL_CAPSULE | Freq: Every day | ORAL | Status: DC

## 2012-04-05 NOTE — Progress Notes (Signed)
  Subjective:    Patient ID: [name], male    DOB: 20-Apr-1920, 78 y.o.   MRN: [id number]  HPI Patient presents today for acute problem followup.  Right ankle/foot: Seen by Dr. Jennette Kettle last week for this. Working diagnoses of gout versus pseudogout. HCTZ was held as SE profile includes  hyperuricemia.  Patient was put on  colchicine for symptomatic treatment. Today, patient states that ankle pain and swelling is resolved. Patient to colchicine for 3 days and then stop when pain resolved. Pain has not recurred.  Right testicular orchitis: Diagnosed by Dr. Jennette Kettle at last visit. Place on doxycycline for symptomatic treatment. Today is day 5 of 10. Patient states that testicular discomfort has resolved and testicular swelling is minimally improved. No dysuria or hematuria. No penile pain.   Review of Systems See HPI, otherwise ROS negative     Objective:   Physical Exam Gen: in chair, NAD CV: RRR PULM: good overall air movement.  GU: minimal R testicular swelling, no pain, no prostatic tenderness on DRE MSK: R ankle without swelling, erythema, or tenderness.      Assessment & Plan:

## 2012-04-05 NOTE — Assessment & Plan Note (Signed)
Altace dose increased to compensate for HCTZ d/c. New Rx given to pt.

## 2012-04-05 NOTE — Patient Instructions (Addendum)
It was good to see today Your ankle any testicles seem to be overall doing better. Continue taking your medications as prescribed INCREASE YOUR ALTACE TO 10MG  DAILY FOR YOUR BLOOD PRESSURE  Make an appointment to be seen either at the end of this week or early next week If he develops any concerning symptoms like fever or worsening pain, please give [place] a call

## 2012-04-05 NOTE — Assessment & Plan Note (Signed)
Clinically improved s/p colchicine. Working dx remains relatively unchanged. Will continue to hold HCTZ as this can be offending agent. Follow up in 5-7 days.

## 2012-04-05 NOTE — Assessment & Plan Note (Signed)
Clinically improving with doxycycline. Continue treatment. Discussed infectous red flags. Follow up visit in 5-7 days to reassess.

## 2012-04-13 ENCOUNTER — Ambulatory Visit: Payer: Medicare Other | Admitting: Family Medicine

## 2012-04-19 ENCOUNTER — Encounter: Payer: Self-pay | Admitting: Family Medicine

## 2012-04-19 ENCOUNTER — Ambulatory Visit (INDEPENDENT_AMBULATORY_CARE_PROVIDER_SITE_OTHER): Payer: Medicare Other | Admitting: Family Medicine

## 2012-04-19 VITALS — BP 158/89 | HR 93 | Ht 66.0 in | Wt 184.0 lb

## 2012-04-19 DIAGNOSIS — D518 Other vitamin B12 deficiency anemias: Secondary | ICD-10-CM

## 2012-04-19 DIAGNOSIS — N401 Enlarged prostate with lower urinary tract symptoms: Secondary | ICD-10-CM

## 2012-04-19 DIAGNOSIS — N453 Epididymo-orchitis: Secondary | ICD-10-CM

## 2012-04-19 DIAGNOSIS — I10 Essential (primary) hypertension: Secondary | ICD-10-CM

## 2012-04-19 DIAGNOSIS — M25571 Pain in right ankle and joints of right foot: Secondary | ICD-10-CM

## 2012-04-19 DIAGNOSIS — N452 Orchitis: Secondary | ICD-10-CM

## 2012-04-19 DIAGNOSIS — M25579 Pain in unspecified ankle and joints of unspecified foot: Secondary | ICD-10-CM

## 2012-04-19 LAB — BASIC METABOLIC PANEL
BUN: 17 mg/dL (ref 6–23)
Potassium: 5.2 mEq/L (ref 3.5–5.3)

## 2012-04-19 MED ORDER — CYANOCOBALAMIN 1000 MCG/ML IJ SOLN
1000.0000 ug | Freq: Once | INTRAMUSCULAR | Status: AC
  Administered 2012-04-19: 1000 ug via INTRAMUSCULAR

## 2012-04-19 NOTE — Progress Notes (Signed)
  Subjective:    Patient ID: [name], male    DOB: 1920-10-11, 78 y.o.   MRN: [id number]  HPI Pt presents today for general follow up visit:  Orchitis: clinically improved. No testicular swelling. No noted pain.   L ankle pain: Clinically resolved. Ambulating with minimal pain. Has been off of colchicine for 3-4 weeks.   Pt has also stopped flomax 2/2 to symptomatic hypotension that has resolved since medication was d/c'd.     Review of Systems See HPI, otherwise RO negative     Objective:   Physical Exam Gen: up in chair, NAD HEENT: NCAT, EOMI, TMs clear bilaterally CV: RRR, no murmurs auscultated PULM: CTAB, no wheezes, rales, rhoncii ABD: S/NT/+ bowel sounds  GU: normal genitalia, no testicular swelling.  EXT: 2+ peripheral pulses         Assessment & Plan:

## 2012-04-19 NOTE — Patient Instructions (Signed)
It was good to see you today  Continue with your current medications  Follow up with Dr. Jennette Kettle in about 2 weeks.  Call if any questions,

## 2012-04-23 NOTE — Assessment & Plan Note (Signed)
Clinically stable. Symptomatic orthostatic hypotension clinically resolved. Continue current regimen. Will also check Cr and K.

## 2012-04-23 NOTE — Assessment & Plan Note (Signed)
Resolved

## 2012-04-23 NOTE — Assessment & Plan Note (Signed)
Asymptomatic off of flomax. Discussed resuming at half regular dose if sxs recur with planned follow up with PCP.

## 2012-04-23 NOTE — Assessment & Plan Note (Signed)
Clinically resolved. Continue to follow. Infectious red flags discussed with pt, which he is aware.

## 2012-05-12 ENCOUNTER — Ambulatory Visit (INDEPENDENT_AMBULATORY_CARE_PROVIDER_SITE_OTHER): Payer: Medicare Other | Admitting: Family Medicine

## 2012-05-12 ENCOUNTER — Encounter: Payer: Self-pay | Admitting: Family Medicine

## 2012-05-12 VITALS — BP 142/72 | HR 80 | Temp 98.7°F | Ht 66.0 in | Wt 182.0 lb

## 2012-05-12 DIAGNOSIS — I10 Essential (primary) hypertension: Secondary | ICD-10-CM

## 2012-05-12 DIAGNOSIS — K222 Esophageal obstruction: Secondary | ICD-10-CM

## 2012-05-12 DIAGNOSIS — N401 Enlarged prostate with lower urinary tract symptoms: Secondary | ICD-10-CM

## 2012-05-12 DIAGNOSIS — H9110 Presbycusis, unspecified ear: Secondary | ICD-10-CM

## 2012-05-14 ENCOUNTER — Encounter: Payer: Self-pay | Admitting: Family Medicine

## 2012-05-14 DIAGNOSIS — H9110 Presbycusis, unspecified ear: Secondary | ICD-10-CM | POA: Insufficient documentation

## 2012-05-14 NOTE — Assessment & Plan Note (Signed)
Currently at goal and doing well and his blood pressure medicine. No medicine changes. There are reviewed.

## 2012-05-14 NOTE — Progress Notes (Signed)
  Subjective:    Patient ID: [name], male    DOB: 1920-08-20, 78 y.o.   MRN: [id number]  HPI  #1. Wants my opinion a getting a hearing aid. He is having some difficulty hearing conversations. He has patella place and wants to know what I think of it. #2. Followup heart disease and hypertension. He's not had any dizzy episodes recently. He stopped his Flomax thinking that might be related to his dizziness and that's about that time and improved. #3. He has had increase in his urinary hesitancy and dribbling since he stopped the Flomax. He's not sure whether he brother be dizzy or have trouble urinating. He is also occasionally having some urinary leakage. #4. He is orchitis is totally resolved and he is having no more testicular pain or redness.  Review of Systems Denies unusual weight change, fever, sweats, chills. He said no shortness of breath and no chest pain. See history of present illness.    Objective:   Physical Exam  GENERAL: Well-developed male no acute distress vital signs are reviewed. His blood pressure is appropriate I want him to somewhere between 1:30 and 160 for systolic and his diastolic should be somewhere between 65 and 90. NECK: No carotid bruits supple no lymphadenopathy no thyromegaly LUNGS: Clear to auscultation CARDIOVASCULAR regular rate and rhythm no murmur gallop or rub Extremity: Slight lower strategy edema that is nonpitting.     Assessment & Plan:

## 2012-05-14 NOTE — Assessment & Plan Note (Signed)
We will try compromise between his urinary hesitancy and his dizziness when he takes the Flomax. We will have him split the Flomax into see how that does he'll let me know in about the next 2-3 weeks.

## 2012-05-14 NOTE — Assessment & Plan Note (Signed)
Currently no problems with this. He follows is fairly carefully as he's had recurrent esophageal strictures with dilation by Dr. Jarold Motto.

## 2012-05-19 ENCOUNTER — Ambulatory Visit (INDEPENDENT_AMBULATORY_CARE_PROVIDER_SITE_OTHER): Payer: Medicare Other | Admitting: *Deleted

## 2012-05-19 DIAGNOSIS — E538 Deficiency of other specified B group vitamins: Secondary | ICD-10-CM

## 2012-05-19 MED ORDER — CYANOCOBALAMIN 1000 MCG/ML IJ SOLN
1000.0000 ug | Freq: Once | INTRAMUSCULAR | Status: AC
  Administered 2012-05-19: 1000 ug via INTRAMUSCULAR

## 2012-06-15 ENCOUNTER — Other Ambulatory Visit: Payer: Self-pay | Admitting: *Deleted

## 2012-06-15 MED ORDER — TRAMADOL HCL 50 MG PO TABS: 50.0000 mg | ORAL_TABLET | Freq: Three times a day (TID) | ORAL | Status: DC | PRN

## 2012-06-21 ENCOUNTER — Other Ambulatory Visit: Payer: Self-pay | Admitting: *Deleted

## 2012-06-21 ENCOUNTER — Ambulatory Visit (INDEPENDENT_AMBULATORY_CARE_PROVIDER_SITE_OTHER): Payer: Medicare Other | Admitting: *Deleted

## 2012-06-21 DIAGNOSIS — E538 Deficiency of other specified B group vitamins: Secondary | ICD-10-CM

## 2012-06-21 MED ORDER — CYANOCOBALAMIN 1000 MCG/ML IJ SOLN
1000.0000 ug | Freq: Once | INTRAMUSCULAR | Status: AC
  Administered 2012-06-21: 1000 ug via INTRAMUSCULAR

## 2012-06-21 MED ORDER — TRAMADOL HCL 50 MG PO TABS: 50.0000 mg | ORAL_TABLET | Freq: Three times a day (TID) | ORAL | Status: DC | PRN

## 2012-07-06 ENCOUNTER — Other Ambulatory Visit: Payer: Self-pay

## 2012-07-23 ENCOUNTER — Ambulatory Visit (INDEPENDENT_AMBULATORY_CARE_PROVIDER_SITE_OTHER): Payer: Medicare Other | Admitting: *Deleted

## 2012-07-23 DIAGNOSIS — E538 Deficiency of other specified B group vitamins: Secondary | ICD-10-CM

## 2012-07-23 MED ORDER — CYANOCOBALAMIN 1000 MCG/ML IJ SOLN
1000.0000 ug | Freq: Once | INTRAMUSCULAR | Status: AC
  Administered 2012-07-23: 1000 ug via INTRAMUSCULAR

## 2012-08-04 ENCOUNTER — Other Ambulatory Visit: Payer: Self-pay | Admitting: Family Medicine

## 2012-08-04 MED ORDER — RIVAROXABAN 15 MG PO TABS: 15.0000 mg | ORAL_TABLET | Freq: Every day | ORAL | Status: DC

## 2012-08-24 ENCOUNTER — Other Ambulatory Visit: Payer: Self-pay | Admitting: *Deleted

## 2012-08-24 ENCOUNTER — Ambulatory Visit (INDEPENDENT_AMBULATORY_CARE_PROVIDER_SITE_OTHER): Payer: Medicare Other | Admitting: *Deleted

## 2012-08-24 DIAGNOSIS — N138 Other obstructive and reflux uropathy: Secondary | ICD-10-CM

## 2012-08-24 DIAGNOSIS — E538 Deficiency of other specified B group vitamins: Secondary | ICD-10-CM

## 2012-08-24 DIAGNOSIS — N401 Enlarged prostate with lower urinary tract symptoms: Secondary | ICD-10-CM

## 2012-08-24 DIAGNOSIS — Z23 Encounter for immunization: Secondary | ICD-10-CM

## 2012-08-24 MED ORDER — TAMSULOSIN HCL 0.4 MG PO CAPS: ORAL_CAPSULE | ORAL | Status: DC

## 2012-08-24 MED ORDER — CYANOCOBALAMIN 1000 MCG/ML IJ SOLN
1000.0000 ug | Freq: Once | INTRAMUSCULAR | Status: AC
  Administered 2012-08-24: 1000 ug via INTRAMUSCULAR

## 2012-08-24 NOTE — Addendum Note (Signed)
Addended by: Salomon Mast on: 08/24/2012 02:18 PM   Modules accepted: Orders

## 2012-08-24 NOTE — Progress Notes (Signed)
Patient reports discomfort with left shoulder  and right lower abdominal discomfort. States he has had this pain for a while but seems to be worsening.  States he has been voiding frequently .   Has not been taking Flomax in past 2 weeks. Dr. Jennette Kettle came in to speak to patient and she advises to send in Flomax 0.4 mg one tab every other day. Marland Kitchen Appointment scheduled tomorrow with Dr. Jennette Kettle.

## 2012-08-25 ENCOUNTER — Ambulatory Visit (INDEPENDENT_AMBULATORY_CARE_PROVIDER_SITE_OTHER): Payer: Medicare Other | Admitting: Family Medicine

## 2012-08-25 ENCOUNTER — Other Ambulatory Visit: Payer: Medicare Other

## 2012-08-25 ENCOUNTER — Encounter: Payer: Self-pay | Admitting: Family Medicine

## 2012-08-25 VITALS — BP 150/82 | HR 93 | Temp 97.7°F | Wt 183.0 lb

## 2012-08-25 DIAGNOSIS — R1031 Right lower quadrant pain: Secondary | ICD-10-CM

## 2012-08-25 DIAGNOSIS — N5089 Other specified disorders of the male genital organs: Secondary | ICD-10-CM

## 2012-08-25 DIAGNOSIS — M25519 Pain in unspecified shoulder: Secondary | ICD-10-CM

## 2012-08-25 DIAGNOSIS — M25512 Pain in left shoulder: Secondary | ICD-10-CM

## 2012-08-25 LAB — COMPREHENSIVE METABOLIC PANEL
AST: 14 U/L (ref 0–37)
Albumin: 4.1 g/dL (ref 3.5–5.2)
Alkaline Phosphatase: 61 U/L (ref 39–117)
BUN: 21 mg/dL (ref 6–23)
Creat: 1.35 mg/dL (ref 0.50–1.35)
Glucose, Bld: 130 mg/dL — ABNORMAL HIGH (ref 70–99)

## 2012-08-25 LAB — CBC WITH DIFFERENTIAL/PLATELET
Basophils Absolute: 0 10*3/uL (ref 0.0–0.1)
Basophils Relative: 0 % (ref 0–1)
HCT: 42.5 % (ref 39.0–52.0)
MCHC: 33.6 g/dL (ref 30.0–36.0)
Monocytes Absolute: 0.5 10*3/uL (ref 0.1–1.0)
Neutro Abs: 5.3 10*3/uL (ref 1.7–7.7)
Platelets: 171 10*3/uL (ref 150–400)
RDW: 14.1 % (ref 11.5–15.5)

## 2012-08-25 MED ORDER — DOXYCYCLINE HYCLATE 50 MG PO CAPS: 50.0000 mg | ORAL_CAPSULE | Freq: Two times a day (BID) | ORAL | Status: DC

## 2012-08-25 NOTE — Patient Instructions (Addendum)
Start the doxycicline today We are setting up a CT scan of your belly I will call you with results  Please call Dr Norval Gable office in the next day or so for an appointment for your swallowing issues

## 2012-08-26 ENCOUNTER — Telehealth: Payer: Self-pay | Admitting: Family Medicine

## 2012-08-26 ENCOUNTER — Ambulatory Visit
Admission: RE | Admit: 2012-08-26 | Discharge: 2012-08-26 | Disposition: A | Discharge status: Home / Self Care | Payer: Medicare Other | Source: Ambulatory Visit | Attending: Family Medicine | Admitting: Family Medicine

## 2012-08-26 DIAGNOSIS — R1031 Right lower quadrant pain: Secondary | ICD-10-CM

## 2012-08-26 MED ORDER — IOHEXOL 300 MG/ML  SOLN
100.0000 mL | Freq: Once | INTRAMUSCULAR | Status: AC | PRN
  Administered 2012-08-26: 100 mL via INTRAVENOUS

## 2012-08-26 NOTE — Progress Notes (Signed)
  Subjective:    Patient ID: [name], male    DOB: 1920-07-27, 78 y.o.   MRN: [id number]  HPI  #1. Left shoulder pain. Has had this before and last time I gave him an injection which really seem to help. Pain is worse when he's trying to sleep on that side at night. Denies numbness or tingling or weakness in the left arm. #2. Recurrence of some right-sided testicular swelling. Painless. No erythema. He had been having a little increased problem with starting his urinary stream but he had stopped the Flomax totally. At yesterday's nurse visit I restarted him on every other day Flomax because when he was on daily Flomax he got a little bit lightheaded and he thinks this dropping his blood pressure too much. So he started taking it again last night. He said no burning on urination, no penile discharge. #3. Right lower quadrant discomfort that feels like gas. He also has had some problems with constipation over the last few weeks.  Review of Systems Denies fever, sweats, chills. No fatigue. No nausea or vomiting. Bowel movements have not changed in caliber or color but he has had some constipation as above. Has seen no blood in his bowel movement. No unusual weight loss or gain.    Objective:   Physical Exam Vital signs are reviewed GENERAL: Well-developed male no acute distress SHOULDER: Left. Full range of motion in all planes the rotator cuff and strength is normal. He does have some pain with supraspinatus testing. Distally he is neurovascularly intact ABDOMEN: Soft positive bowel sounds nontender nondistended. No rebound or guarding. BACK: No CVA tenderness no bruising GU: Testes on the right is without mass or discrete nodule. It appears larger but I think this is related to hernia although I cannot definitively reduce it. Testes are really not very tender on exam and they're no erythema or increased warmth. The penis is without lesion and no discharge.  INJECTION: Patient was given  informed consent, signed copy in the chart. Appropriate time out was taken. Area prepped and draped in usual sterile fashion. One cc of methylprednisolone 40 mg/ml plus  4 cc of 1% lidocaine without epinephrine was injected into the left subacromial bursa using a(n) posterior approach. The patient tolerated the procedure well. There were no complications. Post procedure instructions were given.      Assessment & Plan:  #1. Subacromial bursitis on the left. I gave him corticosteroid injection today. #2. Right testicular enlargement. This may be related to an inguinal hernia. #3 right lower quadrant discomfort, not abdominal pain per se. I think this is likely related to some constipation and bloating. When he had this last year evidently give him some doxycycline on he said that cleared up totally so we will start that again today. I'll also like to get a CT scan of the abdomen pelvis.

## 2012-08-26 NOTE — Telephone Encounter (Signed)
Dear [name] Team Plz call him and tell him the CT scan shows that he has a little hernia on teh ruigt--he actually has one on both sides but the right is a little bigger. That is why his right testacle is larger (swollen). I would do nothing for this except finish teh antibiotics I gave him. If the pain does NOT go away with the antibiotics, he needs to call me and let me know call as soon as he finishes them). If it gets WORSE, he needs to call. The gas should also get better wit the antibiotic. I probably should see him in follow up in 4-6 weeks regardless THANKS! [name]

## 2012-08-27 NOTE — Telephone Encounter (Signed)
Patient returned call to nurse

## 2012-08-27 NOTE — Telephone Encounter (Signed)
Called and spoke with pt and gave him the results and the instructions from Dr. Jennette Kettle. Pt made an appt for 6 wks. Pt understood and agreed.Loralee Pacas [place]

## 2012-08-27 NOTE — Telephone Encounter (Signed)
Called and lvm for pt to call back to inform him of results.  His right testicle is is larger due to swelling and Dr. Jennette Kettle would like for him to finish the antibiotics. If the pain does NOT go away with the antibiotics she would like for him to call her back as soon as he is finished taking ALL of them. However if the pain should get worse he should call to be seen. The antibiotics will help with the gas. Otherwise if all goes well she would like for him to RTC in 4-6 weeks.Loralee Pacas [place]

## 2012-09-16 ENCOUNTER — Ambulatory Visit (INDEPENDENT_AMBULATORY_CARE_PROVIDER_SITE_OTHER): Payer: Medicare Other | Admitting: Gastroenterology

## 2012-09-16 ENCOUNTER — Encounter: Payer: Self-pay | Admitting: Gastroenterology

## 2012-09-16 VITALS — BP 140/86 | HR 80 | Ht 66.0 in | Wt 184.0 lb

## 2012-09-16 DIAGNOSIS — K6389 Other specified diseases of intestine: Secondary | ICD-10-CM

## 2012-09-16 DIAGNOSIS — R140 Abdominal distension (gaseous): Secondary | ICD-10-CM

## 2012-09-16 DIAGNOSIS — R142 Eructation: Secondary | ICD-10-CM

## 2012-09-16 DIAGNOSIS — R141 Gas pain: Secondary | ICD-10-CM

## 2012-09-16 DIAGNOSIS — Z8719 Personal history of other diseases of the digestive system: Secondary | ICD-10-CM

## 2012-09-16 DIAGNOSIS — K589 Irritable bowel syndrome without diarrhea: Secondary | ICD-10-CM

## 2012-09-16 MED ORDER — RIFAXIMIN 550 MG PO TABS: 550.0000 mg | ORAL_TABLET | Freq: Two times a day (BID) | ORAL | Status: DC

## 2012-09-16 NOTE — Patient Instructions (Addendum)
We have sent the following medications to your pharmacy for you to pick up at your convenience: Xifaxan  

## 2012-09-16 NOTE — Progress Notes (Signed)
This is a -year-old Caucasian male in has multiple functional complaints. He is neurogenic dysphagia with recent evaluation by speech pathology, and he seems to be doing well with fair recommendations, and his weight is stable. He is able to swallow food and pills without difficulty. His main complaint is gas and bloating without change in bowel habits. He takes daily probiotics, daily Protonix, and occasional laxatives. He denies lower nominal pain, melena, hematochezia, dysphagia, or history of pancreatitis or hepatitis. He denies a specific food intolerances, use of sorbitol or fructose. He is on B12 replacement therapy.  Current Medications, Allergies, Past Medical History, Past Surgical History, Family History and Social History were reviewed in [employer] record.  Pertinent Review of Systems Negative   Physical Exam: Elderly appearing patient in no distress. Blood pressure 140/86, pulse 80 and regular, weight 184, and BMI 29.70. I cannot appreciate stigmata of chronic liver disease. Chest is clear, and he appears to be in a regular rhythm without murmurs gallops or rubs. His abdomen shows no organomegaly, masses, tenderness, or distention. Bowel sounds are nonobstructive. There is no peripheral edema or phlebitis, and mental status is normal.    Assessment and Plan: Within the last year [name] has had esophageal manometry, endoscopy and colonoscopy, and CT scans of the abdomen and pelvis which have shown no specific abnormalities. He does have acid reflux, we will continue daily Protonix. His symptoms seem most consistent with bacterial overgrowth syndrome and I have prescribed Xifaxan 550 mg twice a day for 2 weeks with continued probiotic therapy. He is to continue to follow his feeding physical maneuvers as suggested by speech pathology. I do not think he needs followup endoscopy. Soft tissue manometry was otherwise entirely normal. Encounter Diagnosis  Name Primary?  .  Bacterial overgrowth syndrome Yes

## 2012-09-22 ENCOUNTER — Encounter: Payer: Self-pay | Admitting: Family Medicine

## 2012-09-22 ENCOUNTER — Ambulatory Visit (INDEPENDENT_AMBULATORY_CARE_PROVIDER_SITE_OTHER): Payer: Medicare Other | Admitting: Family Medicine

## 2012-09-22 VITALS — BP 143/80 | HR 98 | Temp 97.6°F | Wt 184.0 lb

## 2012-09-22 DIAGNOSIS — G8929 Other chronic pain: Secondary | ICD-10-CM

## 2012-09-22 DIAGNOSIS — M25551 Pain in right hip: Secondary | ICD-10-CM | POA: Insufficient documentation

## 2012-09-22 DIAGNOSIS — N5089 Other specified disorders of the male genital organs: Secondary | ICD-10-CM

## 2012-09-22 DIAGNOSIS — M25559 Pain in unspecified hip: Secondary | ICD-10-CM

## 2012-09-22 DIAGNOSIS — D510 Vitamin B12 deficiency anemia due to intrinsic factor deficiency: Secondary | ICD-10-CM

## 2012-09-22 DIAGNOSIS — J309 Allergic rhinitis, unspecified: Secondary | ICD-10-CM

## 2012-09-22 MED ORDER — FLUTICASONE PROPIONATE 50 MCG/ACT NA SUSP
NASAL | Status: DC
Start: 2012-09-22 — End: 2013-07-22

## 2012-09-22 MED ORDER — CYANOCOBALAMIN 1000 MCG/ML IJ SOLN
1000.0000 ug | Freq: Once | INTRAMUSCULAR | Status: AC
  Administered 2012-09-22: 1000 ug via INTRAMUSCULAR

## 2012-09-22 NOTE — Progress Notes (Signed)
  Subjective:    Patient ID: [name], male    DOB: 1920-07-09, 78 y.o.   MRN: [id number]  HPI  #1. Complaint of left nostril being swollen. Has had this ongoing for many years. Over the last month it has seemed more bothersome. Also has fairly chronic rhinorrhea that is unchanged. Previously used steroid nasal spray briefly and had some irritation. No sinus pain. No headaches. No fever. #2. Right lower quadrant pain that is intermittent. Can be 4-6/10 when it occurs. May resolve within a few hours or last all day area does not seem to be related to heating, no relation to activity, no relation to bowel movement. No cramping. Bowel movements her unchanged in frequency, consistency and no blood noted in stool. His gastroenterologist started him on an antibiotic because he thinks maybe there is some bacterial overgrowth. Since she started that and he only has 2 more days of treatment, he is feeling some better. #3. Continued swollen right testicle but it is no longer tender. Continued mild problems with urinary stream hesitation but this is essentially unchanged in the last few months. No discharge from the penis. #4. Chronic right hip pain--tramadol working.  Review of Systems Denies unusual right hip pain. Please see history of present illness above for additional partner review of systems    Objective:   Physical Exam  Vital signs reviewed. GENERAL: Well-developed, well-nourished, no acute distress. CARDIOVASCULAR: RRR LUNGS: Clear to auscultation bilaterally, no rales or wheeze. ABDOMEN: Soft positive bowel sounds NEURO: No gross focal neurological deficits and decreased hearing. MSK: Movement of extremity x 4. Walking with a cane. IR/ER painful on right. PSYCH:  AxO x 4. Normally interactive. Recent and remote memory intact. Normal speech content and fluency.      Assessment & Plan:

## 2012-09-22 NOTE — Assessment & Plan Note (Signed)
stable °

## 2012-09-22 NOTE — Patient Instructions (Addendum)
You are looking WELL! Your blood pressure is doing nicely. I do not think we need to worry about your right abdominal pain unless it gets worse. Let me see you in 3 months.

## 2012-09-22 NOTE — Assessment & Plan Note (Signed)
Using a cane. Continue tramadol when necessary. He uses it mostly at night.

## 2012-09-22 NOTE — Assessment & Plan Note (Signed)
Left nasal turbinate very swollen Will try adding fluticasone

## 2012-10-25 ENCOUNTER — Ambulatory Visit (INDEPENDENT_AMBULATORY_CARE_PROVIDER_SITE_OTHER): Payer: Medicare Other | Admitting: *Deleted

## 2012-10-25 DIAGNOSIS — E538 Deficiency of other specified B group vitamins: Secondary | ICD-10-CM

## 2012-10-25 MED ORDER — CYANOCOBALAMIN 1000 MCG/ML IJ SOLN
1000.0000 ug | Freq: Once | INTRAMUSCULAR | Status: AC
  Administered 2012-10-25: 1000 ug via INTRAMUSCULAR

## 2012-11-02 ENCOUNTER — Ambulatory Visit (HOSPITAL_COMMUNITY)
Admission: RE | Admit: 2012-11-02 | Discharge: 2012-11-02 | Disposition: A | Discharge status: Home / Self Care | Payer: Medicare Other | Source: Ambulatory Visit | Attending: Cardiovascular Disease | Admitting: Cardiovascular Disease

## 2012-11-02 ENCOUNTER — Encounter (HOSPITAL_COMMUNITY): Payer: Self-pay

## 2012-11-02 ENCOUNTER — Encounter (HOSPITAL_COMMUNITY): Payer: Self-pay | Admitting: Anesthesiology

## 2012-11-02 ENCOUNTER — Ambulatory Visit (HOSPITAL_COMMUNITY): Payer: Medicare Other

## 2012-11-02 ENCOUNTER — Encounter (HOSPITAL_COMMUNITY)
Admission: RE | Disposition: A | Discharge status: Home / Self Care | Payer: Self-pay | Source: Ambulatory Visit | Attending: Cardiovascular Disease

## 2012-11-02 DIAGNOSIS — Z5309 Procedure and treatment not carried out because of other contraindication: Secondary | ICD-10-CM | POA: Insufficient documentation

## 2012-11-02 DIAGNOSIS — I4892 Unspecified atrial flutter: Secondary | ICD-10-CM | POA: Insufficient documentation

## 2012-11-02 HISTORY — PX: CARDIOVERSION: SHX1299

## 2012-11-02 LAB — CBC WITH DIFFERENTIAL/PLATELET
Lymphocytes Relative: 16 % (ref 12–46)
Lymphs Abs: 0.9 10*3/uL (ref 0.7–4.0)
Neutrophils Relative %: 76 % (ref 43–77)
Platelets: 160 10*3/uL (ref 150–400)
RBC: 4.54 MIL/uL (ref 4.22–5.81)
WBC: 5.8 10*3/uL (ref 4.0–10.5)

## 2012-11-02 LAB — COMPREHENSIVE METABOLIC PANEL
ALT: 28 U/L (ref 0–53)
AST: 20 U/L (ref 0–37)
Alkaline Phosphatase: 64 U/L (ref 39–117)
CO2: 24 mEq/L (ref 19–32)
GFR calc Af Amer: 55 mL/min — ABNORMAL LOW (ref >90)
GFR calc non Af Amer: 48 mL/min — ABNORMAL LOW (ref >90)
Glucose, Bld: 125 mg/dL — ABNORMAL HIGH (ref 70–99)
Potassium: 4.3 mEq/L (ref 3.5–5.1)
Sodium: 139 mEq/L (ref 135–145)
Total Protein: 7.2 g/dL (ref 6.0–8.3)

## 2012-11-02 LAB — MAGNESIUM: Magnesium: 2.2 mg/dL (ref 1.5–2.5)

## 2012-11-02 SURGERY — CANCELLED PROCEDURE

## 2012-11-02 MED ORDER — SODIUM CHLORIDE 0.9 % IV SOLN
INTRAVENOUS | Status: DC
  Administered 2012-11-02: 500 mL via INTRAVENOUS

## 2012-11-02 MED ORDER — SODIUM CHLORIDE 0.9 % IV SOLN
INTRAVENOUS | Status: DC | PRN
  Administered 2012-11-02 – 2014-09-22 (×2): via INTRAVENOUS

## 2012-11-02 MED ORDER — HYDROCORTISONE 1 % EX CREA: 1.0000 "application " | TOPICAL_CREAM | Freq: Three times a day (TID) | CUTANEOUS | Status: DC | PRN

## 2012-11-02 MED ORDER — SODIUM CHLORIDE 0.9 % IJ SOLN: 3.0000 mL | INTRAMUSCULAR | Status: DC | PRN

## 2012-11-02 NOTE — CV Procedure (Signed)
Pacemake interrogation shows he converted "spontaneously" (on Multaq therapy) to sinus rhythm in last 24 hours. Cardioversion cancelled.  Thurmon Fair, MD, [facility] [phone] office [phone] pager

## 2012-11-02 NOTE — H&P (Signed)
Date of Initial H&P: 10/25/12 and 11/01/12  History reviewed, patient examined, no change in status, stable for surgery. Symptomatic persistent atrial flutter s/p two unsuccessful attempts at overdrive pacing via dual chamber pacemaker, even with Multaq antiarrhythmic therapy. Here for elective DC-cardioversion. Had a similar procedure November 2012, with lasting maintenance of normal rhythm and clinical benefit for about 8 months. This procedure has been fully reviewed with the patient and written informed consent has been obtained. Thurmon Fair, MD, [facility] [phone] office [phone] pager

## 2012-11-02 NOTE — Preoperative (Signed)
Beta Blockers   Reason not to administer Beta Blockers:Not Applicable 

## 2012-11-25 ENCOUNTER — Ambulatory Visit (INDEPENDENT_AMBULATORY_CARE_PROVIDER_SITE_OTHER): Payer: Medicare Other | Admitting: *Deleted

## 2012-11-25 ENCOUNTER — Telehealth: Payer: Self-pay | Admitting: *Deleted

## 2012-11-25 DIAGNOSIS — E538 Deficiency of other specified B group vitamins: Secondary | ICD-10-CM

## 2012-11-25 MED ORDER — CYANOCOBALAMIN 1000 MCG/ML IJ SOLN
1000.0000 ug | Freq: Once | INTRAMUSCULAR | Status: AC
  Administered 2012-11-25: 1000 ug via INTRAMUSCULAR

## 2012-11-25 NOTE — Telephone Encounter (Signed)
Patient requests RX for Bystolic 10 mg , 90 day supply. Please mail to him at address listed . Will forward to Dr. Jennette Kettle.

## 2012-11-26 NOTE — Telephone Encounter (Signed)
Mailing  Denny Levy

## 2012-12-27 ENCOUNTER — Ambulatory Visit (INDEPENDENT_AMBULATORY_CARE_PROVIDER_SITE_OTHER): Payer: Medicare Other | Admitting: *Deleted

## 2012-12-27 DIAGNOSIS — E538 Deficiency of other specified B group vitamins: Secondary | ICD-10-CM

## 2012-12-27 MED ORDER — CYANOCOBALAMIN 1000 MCG/ML IJ SOLN
1000.0000 ug | Freq: Once | INTRAMUSCULAR | Status: AC
  Administered 2012-12-27: 1000 ug via INTRAMUSCULAR

## 2013-01-24 ENCOUNTER — Ambulatory Visit (INDEPENDENT_AMBULATORY_CARE_PROVIDER_SITE_OTHER): Payer: Medicare Other | Admitting: *Deleted

## 2013-01-24 DIAGNOSIS — E538 Deficiency of other specified B group vitamins: Secondary | ICD-10-CM

## 2013-01-25 MED ORDER — CYANOCOBALAMIN 1000 MCG/ML IJ SOLN
1000.0000 ug | Freq: Once | INTRAMUSCULAR | Status: AC
  Administered 2013-01-24: 1000 ug via INTRAMUSCULAR

## 2013-01-25 MED ORDER — CYANOCOBALAMIN 1000 MCG/ML IJ SOLN: 1000.0000 ug | Freq: Once | INTRAMUSCULAR | Status: DC

## 2013-02-16 ENCOUNTER — Encounter: Payer: Self-pay | Admitting: Family Medicine

## 2013-02-16 ENCOUNTER — Ambulatory Visit (INDEPENDENT_AMBULATORY_CARE_PROVIDER_SITE_OTHER): Payer: Medicare Other | Admitting: Family Medicine

## 2013-02-16 VITALS — BP 136/58 | HR 82 | Temp 98.0°F | Ht 66.0 in | Wt 180.0 lb

## 2013-02-16 DIAGNOSIS — R140 Abdominal distension (gaseous): Secondary | ICD-10-CM

## 2013-02-16 DIAGNOSIS — R143 Flatulence: Secondary | ICD-10-CM

## 2013-02-16 MED ORDER — DICYCLOMINE HCL 10 MG PO CAPS: ORAL_CAPSULE | ORAL | Status: DC

## 2013-02-16 MED ORDER — PANTOPRAZOLE SODIUM 40 MG PO TBEC: 40.0000 mg | DELAYED_RELEASE_TABLET | Freq: Every day | ORAL | Status: DC

## 2013-02-16 MED ORDER — RAMIPRIL 10 MG PO CAPS: 10.0000 mg | ORAL_CAPSULE | Freq: Every day | ORAL | Status: DC

## 2013-02-16 NOTE — Patient Instructions (Addendum)
I have given the refills on your Altace and Protonix for you to send in. I have called in a prescription for Bentyl. I would try the Bentyl for your stomach discomfort. He can take one tablet up to 4 times a day if you need it. If it's not helping then I would stop taking it. He seemed to be doing well. Her weight is steady and your blood pressure is great. Good to see you!

## 2013-02-18 NOTE — Progress Notes (Signed)
  Subjective:    Patient ID: [name], male    DOB: 07-May-1920, 79 y.o.   MRN: [id number]  HPI  Here complaining of a lot of bloatedness and gassiness. Usually relieved by bowel movement. He's been taking the probiotics fairly rarely do they don't seem to help much. He has not tried any over-the-counter medication. Is otherwise doing fairly well.  Review of Systems No unusual weight loss. No blood in his stool.    Objective:   Physical Exam  Vital signs are reviewed GENERAL: Well-developed male no acute distress  ABDOMEN soft positive bowel sounds nontender nondistended CREMASTER: Regular rate and rhythm without murmur      Assessment & Plan:  #1. Lightheadedness. We discussed options including over-the-counter Gas-X. I will also give him a prescription for Bentyl to see if that helps. I refilled his other prescription knees and I'll see him back when necessary

## 2013-02-20 ENCOUNTER — Encounter: Payer: Self-pay | Admitting: *Deleted

## 2013-02-23 ENCOUNTER — Encounter: Payer: Self-pay | Admitting: Cardiovascular Disease

## 2013-02-24 ENCOUNTER — Ambulatory Visit (INDEPENDENT_AMBULATORY_CARE_PROVIDER_SITE_OTHER): Payer: Medicare Other | Admitting: *Deleted

## 2013-02-24 ENCOUNTER — Other Ambulatory Visit: Payer: Self-pay | Admitting: Family Medicine

## 2013-02-24 DIAGNOSIS — D518 Other vitamin B12 deficiency anemias: Secondary | ICD-10-CM

## 2013-02-24 MED ORDER — CYANOCOBALAMIN 1000 MCG/ML IJ SOLN
1000.0000 ug | Freq: Once | INTRAMUSCULAR | Status: AC
  Administered 2013-02-24: 1000 ug via INTRAMUSCULAR

## 2013-02-24 NOTE — Progress Notes (Signed)
Patient here today to receive monthly Vitamin B12 injection.   Casey Maxfield Ann, RN   

## 2013-03-29 ENCOUNTER — Ambulatory Visit (INDEPENDENT_AMBULATORY_CARE_PROVIDER_SITE_OTHER): Payer: Medicare Other | Admitting: *Deleted

## 2013-03-29 DIAGNOSIS — D518 Other vitamin B12 deficiency anemias: Secondary | ICD-10-CM

## 2013-03-29 MED ORDER — CYANOCOBALAMIN 1000 MCG/ML IJ SOLN
1000.0000 ug | Freq: Once | INTRAMUSCULAR | Status: AC
  Administered 2013-03-29: 1000 ug via INTRAMUSCULAR

## 2013-03-29 NOTE — Progress Notes (Signed)
Pt here today for B12 injection. Given in right deltoid per pt request  - tolerated well. No further concerns. Wyatt Haste, RN-BSN

## 2013-04-28 ENCOUNTER — Ambulatory Visit (INDEPENDENT_AMBULATORY_CARE_PROVIDER_SITE_OTHER): Payer: Medicare Other | Admitting: *Deleted

## 2013-04-28 DIAGNOSIS — E538 Deficiency of other specified B group vitamins: Secondary | ICD-10-CM

## 2013-04-28 MED ORDER — CYANOCOBALAMIN 1000 MCG/ML IJ SOLN
1000.0000 ug | Freq: Once | INTRAMUSCULAR | Status: AC
  Administered 2013-04-28: 1000 ug via INTRAMUSCULAR

## 2013-04-28 NOTE — Progress Notes (Signed)
Pt here today for b12 shot - given in left deltoid - pt tolerated well - No further issues.  Wyatt Haste, RN-BSN

## 2013-05-13 ENCOUNTER — Other Ambulatory Visit: Payer: Self-pay | Admitting: Cardiovascular Disease

## 2013-05-13 DIAGNOSIS — I4891 Unspecified atrial fibrillation: Secondary | ICD-10-CM

## 2013-05-13 LAB — PACEMAKER DEVICE OBSERVATION

## 2013-05-16 ENCOUNTER — Encounter: Payer: Self-pay | Admitting: *Deleted

## 2013-05-16 LAB — PACEMAKER DEVICE OBSERVATION
AL IMPEDENCE PM: 514 Ohm
AL THRESHOLD: 0.375 V
BATTERY VOLTAGE: 2.7 V
RV LEAD THRESHOLD: 0.875 V
VENTRICULAR PACING PM: 100

## 2013-05-26 ENCOUNTER — Ambulatory Visit (INDEPENDENT_AMBULATORY_CARE_PROVIDER_SITE_OTHER): Payer: Medicare Other | Admitting: *Deleted

## 2013-05-26 DIAGNOSIS — E538 Deficiency of other specified B group vitamins: Secondary | ICD-10-CM

## 2013-05-26 MED ORDER — CYANOCOBALAMIN 1000 MCG/ML IJ SOLN
1000.0000 ug | Freq: Once | INTRAMUSCULAR | Status: AC
  Administered 2013-05-26: 1000 ug via INTRAMUSCULAR

## 2013-05-26 NOTE — Progress Notes (Signed)
Pt here today for B12 injection. Given in right deltoid  - tolerated well. No further concerns. Elizabeth Cloma Rahrig, RN-BSN   

## 2013-06-27 ENCOUNTER — Ambulatory Visit (INDEPENDENT_AMBULATORY_CARE_PROVIDER_SITE_OTHER): Payer: Medicare Other | Admitting: *Deleted

## 2013-06-27 DIAGNOSIS — D518 Other vitamin B12 deficiency anemias: Secondary | ICD-10-CM

## 2013-06-27 MED ORDER — CYANOCOBALAMIN 1000 MCG/ML IJ SOLN
1000.0000 ug | Freq: Once | INTRAMUSCULAR | Status: AC
  Administered 2013-06-27: 1000 ug via INTRAMUSCULAR

## 2013-06-27 NOTE — Progress Notes (Signed)
Pt would like script for Xarelto mailed to his address - he is close to running out and needs to mail it to the pharmacy: [facility]  [address] , [place] [address]   Wyatt Haste, RN-BSN

## 2013-06-27 NOTE — Progress Notes (Signed)
  Subjective:    Patient ID: [name], male    DOB: 1920-09-18, 79 y.o.   MRN: [id number]  HPI    Review of Systems     Objective:   Physical Exam        Assessment & Plan:

## 2013-06-28 ENCOUNTER — Other Ambulatory Visit: Payer: Self-pay | Admitting: Family Medicine

## 2013-06-28 MED ORDER — RIVAROXABAN 15 MG PO TABS: 15.0000 mg | ORAL_TABLET | Freq: Every day | ORAL | Status: DC

## 2013-06-28 NOTE — Progress Notes (Signed)
Dear Cliffton Asters Team Please tell him I am sending him a rx in the mail BUT--I now have th e ability to send it directly ro [facility] and have done that as well. He should call and see if they got the electronic copy and then he willnot haveto  Mail this one. Easier for him. THANKS! Denny Levy

## 2013-06-30 ENCOUNTER — Telehealth: Payer: Self-pay | Admitting: *Deleted

## 2013-06-30 NOTE — Telephone Encounter (Signed)
Message left for pt regarding prescription. Wyatt Haste, RN-BSN

## 2013-06-30 NOTE — Progress Notes (Signed)
Message left Wyatt Haste, RN-BSN

## 2013-07-22 ENCOUNTER — Encounter: Payer: Self-pay | Admitting: Cardiovascular Disease

## 2013-07-22 ENCOUNTER — Ambulatory Visit (INDEPENDENT_AMBULATORY_CARE_PROVIDER_SITE_OTHER): Payer: Medicare Other | Admitting: Cardiovascular Disease

## 2013-07-22 ENCOUNTER — Other Ambulatory Visit: Payer: Self-pay | Admitting: Cardiovascular Disease

## 2013-07-22 VITALS — BP 134/74 | HR 86 | Resp 20 | Ht 66.0 in | Wt 188.4 lb

## 2013-07-22 DIAGNOSIS — Z950 Presence of cardiac pacemaker: Secondary | ICD-10-CM

## 2013-07-22 DIAGNOSIS — I4891 Unspecified atrial fibrillation: Secondary | ICD-10-CM

## 2013-07-22 LAB — PACEMAKER DEVICE OBSERVATION

## 2013-07-22 MED ORDER — DRONEDARONE HCL 400 MG PO TABS: 400.0000 mg | ORAL_TABLET | Freq: Two times a day (BID) | ORAL | Status: DC

## 2013-07-22 NOTE — Patient Instructions (Addendum)
Remote monitoring is used to monitor your Pacemaker of ICD from home. This monitoring reduces the number of office visits required to check your device to one time per year. It allows [place] to keep an eye on the functioning of your device to ensure it is working properly. You are scheduled for a device check from home on 10-24-2013. You may send your transmission at any time that day. If you have a wireless device, the transmission will be sent automatically. After your physician reviews your transmission, you will receive a postcard with your next transmission date.  Your physician recommends that you schedule a follow-up appointment in: 1 year

## 2013-07-23 ENCOUNTER — Encounter: Payer: Self-pay | Admitting: Cardiovascular Disease

## 2013-07-23 DIAGNOSIS — Z950 Presence of cardiac pacemaker: Secondary | ICD-10-CM | POA: Insufficient documentation

## 2013-07-23 NOTE — Progress Notes (Signed)
Patient ID: KMARION RAWL, male   DOB: October 08, 1920, 79 y.o.   MRN: [id number]     Reason for office visit Atrial fibrillation; pacemaker  Mr. Cheever is feeling well. He has a history of persistent atrial fibrillation. We had scheduled him for cardioversion December of 2013 And started antiarrhythmic therapy with multaq, which led to chemical cardioversion within the colon the 2-3 days of therapy. Interrogation of his pacemaker shows that he has had only very brief and very infrequent arrhythmia since then. He generally feels well. He does have "good days and bad days". The episodes of atrial tachyarrhythmia only lasts for about a minute and only 4 of them have happened the last 3 months. It is therefore not the cause of his "bad days". He denies any frank dyspnea or chest pain. His "bad days" consists of increased weakness.  Has long-standing conduction system disease. It appears that after we started treatment with Multaq he developed complete heart block and now has virtually 100% atrial pacing at 100% ventricular pacing/pacemaker dependent. His pacemaker was implanted in 2006 and is approaching end of service.    Allergies  Allergen Reactions  . Codeine     REACTION: nausea    Current Outpatient Prescriptions  Medication Sig Dispense Refill  . beta carotene w/minerals (OCUVITE) tablet Take 1 tablet by mouth daily.      . Cyanocobalamin (VITAMIN B-12 IJ) Inject as directed. Once a month       . dronedarone (MULTAQ) 400 MG tablet Take 1 tablet (400 mg total) by mouth 2 (two) times daily with a meal.  180 tablet  3  . nebivolol (BYSTOLIC) 10 MG tablet Take 1 tablet (10 mg total) by mouth daily.  90 tablet  3  . pantoprazole (PROTONIX) 40 MG tablet Take 1 tablet (40 mg total) by mouth daily.  90 tablet  3  . Probiotic Product (PHILLIPS COLON HEALTH PO) Take by mouth daily.        . ramipril (ALTACE) 10 MG capsule Take 1 capsule (10 mg total) by mouth daily. Generic preferred  90 capsule  3    . Rivaroxaban (XARELTO) 15 MG TABS tablet Take 1 tablet (15 mg total) by mouth daily.  90 tablet  3  . Tamsulosin HCl (FLOMAX) 0.4 MG CAPS Take one tablet every other day  45 capsule  3   No current facility-administered medications for this visit.   Facility-Administered Medications Ordered in Other Visits  Medication Dose Route Frequency Provider Last Rate Last Dose  . 0.9 %  sodium chloride infusion    Continuous PRN Gwenyth Allegra, CRNA        Past Medical History  Diagnosis Date  . Overactive bladder   . Arthritis   . Diverticulosis of colon (without mention of hemorrhage)   . Gout   . Chronic kidney disease, stage III (moderate)   . Family history of malignant neoplasm of gastrointestinal tract   . Stricture and stenosis of esophagus   . Acute gastritis without mention of hemorrhage   . Unspecified cataract   . Hypertrophy of prostate with urinary obstruction and other lower urinary tract symptoms (LUTS)   . Personal history of colonic polyps 10/21/2006    hyperplastic   . Anemia   . Vitamin B12 deficiency   . Hiatal hernia   . Type II or unspecified type diabetes mellitus without mention of complication, not stated as uncontrolled     pt states that he checks his blood sugar once weekly,  is not on oral meds or insulin  . Atrial fibrillation   . Sinus node dysfunction   . Presence of permanent cardiac pacemaker 10/14/05    medtronic Adapta  . Hypertension     Past Surgical History  Procedure Laterality Date  . Appendectomy    . Tonsillectomy    . Pacemaker insertion    . Total hip arthroplasty    . Cardioversion  10/01/2011    Procedure: CARDIOVERSION;  Surgeon: Rachelle Hora Kassidy Dockendorf;  Location: [facility] OR;  Service: Cardiovascular;  Laterality: N/A;  OP CARDIOVERSION  . Cardioversion  11/02/12    successful    Family History  Problem Relation Age of Onset  . Colon cancer Brother 54  . Hypertension Brother   . Hypertension Father   . Parkinsonism Mother   . Hodgkin's  lymphoma Grandchild     History   Social History  . Marital Status: Widowed    Spouse Name: N/A    Number of Children: N/A  . Years of Education: N/A   Occupational History  . Not on file.   Social History Main Topics  . Smoking status: Former Smoker    Quit date: 11/17/1969  . Smokeless tobacco: Never Used  . Alcohol Use: No     Comment: none since 1990  . Drug Use: No  . Sexual Activity: Not on file   Other Topics Concern  . Not on file   Social History Narrative  . No narrative on file    Review of systems: The patient specifically denies any chest pain at rest or with exertion, dyspnea at rest or with exertion, orthopnea, paroxysmal nocturnal dyspnea, syncope, palpitations, focal neurological deficits, intermittent claudication, lower extremity edema, unexplained weight gain, cough, hemoptysis or wheezing.  The patient also denies abdominal pain, nausea, vomiting, dysphagia, diarrhea, constipation, polyuria, polydipsia, dysuria, hematuria, frequency, urgency, abnormal bleeding or bruising, fever, chills, unexpected weight changes, mood swings, change in skin or hair texture, change in voice quality, auditory or visual problems, allergic reactions or rashes, new musculoskeletal complaints other than usual "aches and pains".   PHYSICAL EXAM BP 134/74  Pulse 86  Resp 20  Ht 5\' 6"  (1.676 m)  Wt 188 lb 6.4 oz (85.458 kg)  BMI 30.42 kg/m2  General: Alert, oriented x3, no distress Head: no evidence of trauma, PERRL, EOMI, no exophtalmos or lid lag, no myxedema, no xanthelasma; normal ears, nose and oropharynx Neck: normal jugular venous pulsations and no hepatojugular reflux; brisk carotid pulses without delay and no carotid bruits Chest: clear to auscultation, no signs of consolidation by percussion or palpation, normal fremitus, symmetrical and full respiratory excursions Cardiovascular: normal position and quality of the apical impulse, regular rhythm, normal first and  second heart sounds, no murmurs, rubs or gallops Abdomen: no tenderness or distention, no masses by palpation, no abnormal pulsatility or arterial bruits, normal bowel sounds, no hepatosplenomegaly Extremities: no clubbing, cyanosis or edema; 2+ radial, ulnar and brachial pulses bilaterally; 2+ right femoral, posterior tibial and dorsalis pedis pulses; 2+ left femoral, posterior tibial and dorsalis pedis pulses; no subclavian or femoral bruits Neurological: grossly nonfocal   EKG: AV sequential pacing  Lipid Panel     Component Value Date/Time   CHOL 152 07/05/2008 2038   TRIG 315* 07/05/2008 2038   HDL 28* 07/05/2008 2038   CHOLHDL 5.4 Ratio 07/05/2008 2038   VLDL 63* 07/05/2008 2038   LDLCALC 61 07/05/2008 2038    BMET    Component Value Date/Time   NA 139 11/02/2012  1000   K 4.3 11/02/2012 1000   CL 101 11/02/2012 1000   CO2 24 11/02/2012 1000   GLUCOSE 125* 11/02/2012 1000   BUN 22 11/02/2012 1000   CREATININE 1.28 11/02/2012 1000   CREATININE 1.35 08/25/2012 0859   CALCIUM 9.4 11/02/2012 1000   GFRNONAA 48* 11/02/2012 1000   GFRAA 55* 11/02/2012 1000     ASSESSMENT AND PLAN AF (atrial fibrillation) He had persistent symptomatic atrial fibrillation, with symptoms that are bothersome even with adequate rate control. He has had long-term successful maintenance of sinus rhythm on the multaq, without evidence of congestive heart failure. He is on appropriate anticoagulation. He has not had any bleeding problems.  Pacemaker - Medtronic Adapta 2006 His pacemaker is functioning normally. A check performed today shows very brief episodes of atrial tachycardia/atrial fibrillation generally lasting around a minute. Only 4 episodes have been reported since his last appointment. Normal but the generator is approaching the end of service. Anticipate device change out in the next 6 months. Please see separate device check noted.   recheck pacemaker in 3 months thereafter probably will need  to check monthly; would like to avoid conversion to VVI mode since this may be associated with recurrence of atrial fibrillation Orders Placed This Encounter  Procedures  . EKG 12-Lead   Meds ordered this encounter  Medications  . dronedarone (MULTAQ) 400 MG tablet    Sig: Take 1 tablet (400 mg total) by mouth 2 (two) times daily with a meal.    Dispense:  180 tablet    Refill:  3    Mena Lienau  Thurmon Fair, MD, [facility] [phone] office [phone] pager

## 2013-07-23 NOTE — Assessment & Plan Note (Signed)
He had persistent symptomatic atrial fibrillation, with symptoms that are bothersome even with adequate rate control. He has had long-term successful maintenance of sinus rhythm on the multaq, without evidence of congestive heart failure. He is on appropriate anticoagulation. He has not had any bleeding problems.

## 2013-07-23 NOTE — Assessment & Plan Note (Signed)
His pacemaker is functioning normally. A check performed today shows very brief episodes of atrial tachycardia/atrial fibrillation generally lasting around a minute. Only 4 episodes have been reported since his last appointment. Normal but the generator is approaching the end of service. Anticipate device change out in the next 6 months. Please see separate device check noted.

## 2013-07-25 LAB — PACEMAKER DEVICE OBSERVATION
AL AMPLITUDE: 2 mv
BAMS-0001: 150 {beats}/min
BATTERY VOLTAGE: 2.7 V
RV LEAD AMPLITUDE: 15.68 mv
RV LEAD THRESHOLD: 0.5 V

## 2013-07-26 ENCOUNTER — Ambulatory Visit (INDEPENDENT_AMBULATORY_CARE_PROVIDER_SITE_OTHER): Payer: Medicare Other | Admitting: *Deleted

## 2013-07-26 DIAGNOSIS — D518 Other vitamin B12 deficiency anemias: Secondary | ICD-10-CM

## 2013-07-26 MED ORDER — CYANOCOBALAMIN 1000 MCG/ML IJ SOLN
1000.0000 ug | Freq: Once | INTRAMUSCULAR | Status: AC
  Administered 2013-07-26: 1000 ug via INTRAMUSCULAR

## 2013-08-02 ENCOUNTER — Telehealth: Payer: Self-pay | Admitting: Cardiovascular Disease

## 2013-08-02 NOTE — Telephone Encounter (Signed)
Returned call to [place].  Informed per Dr. Royann Shivers.  Verbalized understanding and repeated instructions.

## 2013-08-02 NOTE — Telephone Encounter (Signed)
Stop xarelto 24 h before, it wears off much quicker than warfarin. Pack and stitch OK if his dentist thinks it is necessary

## 2013-08-02 NOTE — Telephone Encounter (Signed)
Need to know about stopping his Xarelto-having a tooth extracted-how many days before extraction does he need to stop taking it.Also hat to know if he can pack and stitch where the tooth is extracted,If not what other option does he have.

## 2013-08-02 NOTE — Telephone Encounter (Signed)
Message forwarded to Dr. [name].  

## 2013-08-26 ENCOUNTER — Ambulatory Visit (INDEPENDENT_AMBULATORY_CARE_PROVIDER_SITE_OTHER): Payer: Medicare Other | Admitting: *Deleted

## 2013-08-26 DIAGNOSIS — D518 Other vitamin B12 deficiency anemias: Secondary | ICD-10-CM

## 2013-08-26 DIAGNOSIS — Z23 Encounter for immunization: Secondary | ICD-10-CM

## 2013-08-26 MED ORDER — CYANOCOBALAMIN 1000 MCG/ML IJ SOLN
1000.0000 ug | Freq: Once | INTRAMUSCULAR | Status: AC
  Administered 2013-08-26: 1000 ug via INTRAMUSCULAR

## 2013-08-31 ENCOUNTER — Ambulatory Visit (INDEPENDENT_AMBULATORY_CARE_PROVIDER_SITE_OTHER): Payer: Medicare Other | Admitting: Family Medicine

## 2013-08-31 ENCOUNTER — Ambulatory Visit
Admission: RE | Admit: 2013-08-31 | Discharge: 2013-08-31 | Disposition: A | Discharge status: Home / Self Care | Payer: Medicare Other | Source: Ambulatory Visit | Attending: Family Medicine | Admitting: Family Medicine

## 2013-08-31 ENCOUNTER — Encounter: Payer: Self-pay | Admitting: Family Medicine

## 2013-08-31 VITALS — BP 148/87 | HR 91 | Temp 98.3°F | Ht 66.0 in | Wt 189.0 lb

## 2013-08-31 DIAGNOSIS — R1031 Right lower quadrant pain: Secondary | ICD-10-CM

## 2013-08-31 DIAGNOSIS — R52 Pain, unspecified: Secondary | ICD-10-CM

## 2013-08-31 MED ORDER — METRONIDAZOLE 500 MG PO TABS: 500.0000 mg | ORAL_TABLET | Freq: Three times a day (TID) | ORAL | Status: DC

## 2013-08-31 MED ORDER — CIPROFLOXACIN HCL 500 MG PO TABS: 500.0000 mg | ORAL_TABLET | Freq: Two times a day (BID) | ORAL | Status: DC

## 2013-08-31 MED ORDER — IOHEXOL 300 MG/ML  SOLN
30.0000 mL | Freq: Once | INTRAMUSCULAR | Status: AC | PRN
  Administered 2013-08-31: 30 mL via ORAL

## 2013-08-31 NOTE — Assessment & Plan Note (Signed)
Discussed with Dr. Jennette Kettle (PCP, afternoon preceptor). Uncertain cause, though with age, hx of obstruction, and afib on Xarelto, CT scan done this afternoon to rule out dangerous/emergent intra-abd process (perforation, diverticulitis, retroperitoneal bleed, etc). Also with hx of chronic right hip pain, possible contribution to current sx, with vague ?groin pain on exam, as well. CT scan shows diverticulosis and moderate stool burden but no obvious sequelae to suggest acute infection, and pt afebrile in clinic. Called pt (left voice mail) and pt's son (discussed in person; pt present in clinic today with daughter in law, son's wife).   Rx for Cipro/Flagyl for 7 days for possible early diverticulitis, though suspect constipation may also be major contributor. May continue Miralax PRN. F/u in about 1 week to re-evaluate. Strict return precautions discussed with pt's son, who voiced understanding and expressed thanks. Note forwarded to Dr. Jennette Kettle.

## 2013-08-31 NOTE — Patient Instructions (Signed)
Thank you for coming in, today!  I want you to get a CT scan of your abdomen to make sure there's nothing serious going on to cause your pain. We will help schedule this today if possible.  We will let you know what the results look like and give you more advice depending on what the scan shows. Come back in about 1 to 2 weeks, otherwise.  Please feel free to call with any questions or concerns at any time, at [phone]. --Dr. Casper Harrison

## 2013-08-31 NOTE — Progress Notes (Signed)
  Subjective:    Patient ID: [name], male    DOB: [birth year], 79 y.o.   MRN: [id number]  HPI: Pt presents to clinic with daughter in law for acute RLQ pain; of note, pt has chronic abdominal pain and right hip pain as well as hx of diverticulosis/diverticulitis, "twisted colon" (?obstruction) requiring laparoscopic surgery on the right about 7 years ago (?secondary to incision for appendectomy in the 1980's). Pt states he has had RLQ "soreness" for "a long time" but has had worse pain for two days, worse with changes in position and "twisting" his body. Pt has taken tramadol yesterday and today with some relief. Reports having a BM this morning, normal caliber/color without blood, and BM itself was not painful, though abd pain was worse after having the BM. Pain is 2/10 with sitting, up to 7-8/10 with some movement, "at its worst."  Review of Systems: As above. Generally feels "okay," but is concerned about pain. Denies fever/chills, N/V, diarrhea; does state he occasionally takes Miralax. Reports normal BM's/flatus, as above.     Objective:   Physical Exam BP 148/87  Pulse 91  Temp(Src) 98.3 F (36.8 C) (Oral)  Ht 5\' 6"  (1.676 m)  Wt 189 lb (85.73 kg)  BMI 30.52 kg/m2 Gen: elderly male in no acute distress, very pleasant Cardio: irregularly irregular but good distal pulses and no definite murmur appreciated (soft heart sounds) Pulm: CTAB, no wheezes Abd: soft with truncal obesity, normal bowel sounds, pain increased with sitting/lying/position changes for exam  ?mildy distended, vague right lower quadrant vs right groin tenderness to palpation  No discrete masses appreciated, pain not increased with internal/external rotation of hip Ext: warm, well-perfused, stands unassisted, ambulates with cane     Assessment & Plan:  Discussed with Dr. Jennette Kettle (pt's PCP, very familiar with him). Urgent CT today unconcerning for dangerous intra-abdominal pathology. Cipro/Flagyl for possible early  diverticulitis and f/u in 1 week. See problem list notes.

## 2013-09-08 ENCOUNTER — Other Ambulatory Visit: Payer: Self-pay | Admitting: *Deleted

## 2013-09-08 MED ORDER — DRONEDARONE HCL 400 MG PO TABS: 400.0000 mg | ORAL_TABLET | Freq: Two times a day (BID) | ORAL | Status: DC

## 2013-09-08 NOTE — Telephone Encounter (Signed)
Walk-In  Pt out of [facility] and requesting 10-day Rx be sent to [facility] [address]  Confirmed pt does have printed Rx from 9.5.14 and will mail Rx when he gets home.  Samples given to pt in office.

## 2013-10-05 ENCOUNTER — Encounter: Payer: Self-pay | Admitting: Family Medicine

## 2013-10-05 ENCOUNTER — Ambulatory Visit (INDEPENDENT_AMBULATORY_CARE_PROVIDER_SITE_OTHER): Payer: Medicare Other | Admitting: Family Medicine

## 2013-10-05 VITALS — BP 162/78 | HR 92 | Temp 98.1°F | Ht 66.0 in | Wt 187.0 lb

## 2013-10-05 DIAGNOSIS — Z7901 Long term (current) use of anticoagulants: Secondary | ICD-10-CM

## 2013-10-05 DIAGNOSIS — N138 Other obstructive and reflux uropathy: Secondary | ICD-10-CM

## 2013-10-05 DIAGNOSIS — I4891 Unspecified atrial fibrillation: Secondary | ICD-10-CM

## 2013-10-05 DIAGNOSIS — I10 Essential (primary) hypertension: Secondary | ICD-10-CM

## 2013-10-05 DIAGNOSIS — N183 Chronic kidney disease, stage 3 unspecified: Secondary | ICD-10-CM

## 2013-10-05 DIAGNOSIS — N139 Obstructive and reflux uropathy, unspecified: Secondary | ICD-10-CM

## 2013-10-05 DIAGNOSIS — N401 Enlarged prostate with lower urinary tract symptoms: Secondary | ICD-10-CM

## 2013-10-05 LAB — COMPREHENSIVE METABOLIC PANEL
AST: 14 U/L (ref 0–37)
Albumin: 4.4 g/dL (ref 3.5–5.2)
Alkaline Phosphatase: 53 U/L (ref 39–117)
BUN: 20 mg/dL (ref 6–23)
Creat: 1.29 mg/dL (ref 0.50–1.35)
Potassium: 4.9 mEq/L (ref 3.5–5.3)
Sodium: 133 mEq/L — ABNORMAL LOW (ref 135–145)

## 2013-10-05 LAB — CBC WITH DIFFERENTIAL/PLATELET
Basophils Absolute: 0.1 10*3/uL (ref 0.0–0.1)
Basophils Relative: 1 % (ref 0–1)
Eosinophils Absolute: 0.5 10*3/uL (ref 0.0–0.7)
Eosinophils Relative: 6 % — ABNORMAL HIGH (ref 0–5)
HCT: 40 % (ref 39.0–52.0)
Hemoglobin: 13.8 g/dL (ref 13.0–17.0)
MCH: 31 pg (ref 26.0–34.0)
MCHC: 34.5 g/dL (ref 30.0–36.0)
Monocytes Absolute: 0.6 10*3/uL (ref 0.1–1.0)
Monocytes Relative: 7 % (ref 3–12)
Neutrophils Relative %: 65 % (ref 43–77)

## 2013-10-05 MED ORDER — CYANOCOBALAMIN 1000 MCG/ML IJ SOLN
1000.0000 ug | Freq: Once | INTRAMUSCULAR | Status: AC
  Administered 2013-10-05: 1000 ug via INTRAMUSCULAR

## 2013-10-05 MED ORDER — TAMSULOSIN HCL 0.4 MG PO CAPS: ORAL_CAPSULE | ORAL | Status: DC

## 2013-10-05 NOTE — Patient Instructions (Signed)
I will send you a note about your blood work. GREAT to see you!

## 2013-10-07 NOTE — Assessment & Plan Note (Signed)
Recheck Cr. 

## 2013-10-07 NOTE — Assessment & Plan Note (Signed)
-   continue xarelto

## 2013-10-07 NOTE — Assessment & Plan Note (Addendum)
Refilled flomax--it seems to  Be working well

## 2013-10-07 NOTE — Progress Notes (Signed)
  Subjective:    Patient ID: [name], male    DOB: 1920-04-02, 79 y.o.   MRN: [id number]  HPI Followup hypertension. No problems with chest pain. No lower extremity swelling. Occasionally has dizziness after he stands he's been sitting for a while but overall is doing well. No abdominal pain. Bowel movements a normal except for one or 2 episodes of dark her stools.   Review of Systems Pertinent review of systems: negative for fever or unusual weight change.     Objective:   Physical Exam Vital signs reviewed GENERALl: Well developed, well nourished, in no acute distress. NECK: Supple, FROM, without lymphadenopathy.  THYROID: normal without nodularity CAROTID ARTERIES: without bruits LUNGS: clear to auscultation bilaterally. No wheezes or rales. HEART: IRR Irr ni murmur ABDOMEN: soft with positive bowel sounds MSK: MOE x 4 SKIN no rash NEURO: no focal deficits        Assessment & Plan:  Re one or two episodes of dark stools. Will chek hgb.

## 2013-10-14 ENCOUNTER — Emergency Department (HOSPITAL_COMMUNITY)
Admission: EM | Admit: 2013-10-14 | Discharge: 2013-10-14 | Disposition: A | Discharge status: Home / Self Care | Payer: Medicare Other | Attending: Emergency Medicine | Admitting: Emergency Medicine

## 2013-10-14 ENCOUNTER — Encounter (HOSPITAL_COMMUNITY): Payer: Self-pay | Admitting: Emergency Medicine

## 2013-10-14 DIAGNOSIS — Z7901 Long term (current) use of anticoagulants: Secondary | ICD-10-CM | POA: Insufficient documentation

## 2013-10-14 DIAGNOSIS — Z79899 Other long term (current) drug therapy: Secondary | ICD-10-CM | POA: Insufficient documentation

## 2013-10-14 DIAGNOSIS — N183 Chronic kidney disease, stage 3 unspecified: Secondary | ICD-10-CM | POA: Insufficient documentation

## 2013-10-14 DIAGNOSIS — Z87891 Personal history of nicotine dependence: Secondary | ICD-10-CM | POA: Insufficient documentation

## 2013-10-14 DIAGNOSIS — Z950 Presence of cardiac pacemaker: Secondary | ICD-10-CM | POA: Insufficient documentation

## 2013-10-14 DIAGNOSIS — N318 Other neuromuscular dysfunction of bladder: Secondary | ICD-10-CM | POA: Insufficient documentation

## 2013-10-14 DIAGNOSIS — M129 Arthropathy, unspecified: Secondary | ICD-10-CM | POA: Insufficient documentation

## 2013-10-14 DIAGNOSIS — E119 Type 2 diabetes mellitus without complications: Secondary | ICD-10-CM | POA: Insufficient documentation

## 2013-10-14 DIAGNOSIS — I129 Hypertensive chronic kidney disease with stage 1 through stage 4 chronic kidney disease, or unspecified chronic kidney disease: Secondary | ICD-10-CM | POA: Insufficient documentation

## 2013-10-14 DIAGNOSIS — I4891 Unspecified atrial fibrillation: Secondary | ICD-10-CM | POA: Insufficient documentation

## 2013-10-14 DIAGNOSIS — N390 Urinary tract infection, site not specified: Secondary | ICD-10-CM | POA: Insufficient documentation

## 2013-10-14 LAB — PROTIME-INR: Prothrombin Time: 15.1 seconds (ref 11.6–15.2)

## 2013-10-14 LAB — CBC WITH DIFFERENTIAL/PLATELET
Basophils Absolute: 0 10*3/uL (ref 0.0–0.1)
Eosinophils Relative: 4 % (ref 0–5)
HCT: 35.1 % — ABNORMAL LOW (ref 39.0–52.0)
Hemoglobin: 12.2 g/dL — ABNORMAL LOW (ref 13.0–17.0)
Lymphocytes Relative: 15 % (ref 12–46)
MCH: 31.9 pg (ref 26.0–34.0)
MCHC: 34.8 g/dL (ref 30.0–36.0)
MCV: 91.6 fL (ref 78.0–100.0)
Monocytes Absolute: 0.5 10*3/uL (ref 0.1–1.0)
Monocytes Relative: 8 % (ref 3–12)
Neutro Abs: 5 10*3/uL (ref 1.7–7.7)
RBC: 3.83 MIL/uL — ABNORMAL LOW (ref 4.22–5.81)
RDW: 13.7 % (ref 11.5–15.5)
WBC: 6.8 10*3/uL (ref 4.0–10.5)

## 2013-10-14 LAB — URINE MICROSCOPIC-ADD ON

## 2013-10-14 LAB — BASIC METABOLIC PANEL
BUN: 25 mg/dL — ABNORMAL HIGH (ref 6–23)
CO2: 25 mEq/L (ref 19–32)
Calcium: 8.6 mg/dL (ref 8.4–10.5)
Chloride: 101 mEq/L (ref 96–112)
Creatinine, Ser: 1.43 mg/dL — ABNORMAL HIGH (ref 0.50–1.35)
GFR calc Af Amer: 48 mL/min — ABNORMAL LOW (ref >90)
GFR calc non Af Amer: 41 mL/min — ABNORMAL LOW (ref >90)

## 2013-10-14 LAB — URINALYSIS, ROUTINE W REFLEX MICROSCOPIC
Glucose, UA: NEGATIVE mg/dL
Ketones, ur: NEGATIVE mg/dL
Nitrite: NEGATIVE
Protein, ur: NEGATIVE mg/dL
pH: 6 (ref 5.0–8.0)

## 2013-10-14 MED ORDER — SULFAMETHOXAZOLE-TMP DS 800-160 MG PO TABS: 1.0000 | ORAL_TABLET | Freq: Two times a day (BID) | ORAL | Status: DC

## 2013-10-14 NOTE — ED Notes (Signed)
Pt c/o hematuria today; pt takes xerelto; pt sts some discomfort in penis

## 2013-10-14 NOTE — ED Provider Notes (Signed)
TIME SEEN: 4:39 PM  CHIEF COMPLAINT: Hematuria  HPI: Patient is a 79 year old male with a history of A. fib on Xarelto, overactive bladder, chronic kidney disease who presents the emergency department with an episode of hematuria today. He states when urinating this afternoon he saw gross blood. No clots. He did have some discomfort with urination. He states that his hematuria has resolved but now his urine appears dark. Denies any nausea, vomiting or diarrhea. No fevers or chills. No flank pain or abdominal pain. No history of trauma to his back, abdomen or genital area. No prior history of kidney stones. No history of bladder cancer. No testicular pain or swelling. No penile discharge.  ROS: See HPI Constitutional: no fever  Eyes: no drainage  ENT: no runny nose   Cardiovascular:  no chest pain  Resp: no SOB  GI: no vomiting GU: no dysuria Integumentary: no rash  Allergy: no hives  Musculoskeletal: no leg swelling  Neurological: no slurred speech ROS otherwise negative  PAST MEDICAL HISTORY/PAST SURGICAL HISTORY:  Past Medical History  Diagnosis Date  . Overactive bladder   . Arthritis   . Diverticulosis of colon (without mention of hemorrhage)   . Gout   . Chronic kidney disease, stage III (moderate)   . Family history of malignant neoplasm of gastrointestinal tract   . Stricture and stenosis of esophagus   . Acute gastritis without mention of hemorrhage   . Unspecified cataract   . Hypertrophy of prostate with urinary obstruction and other lower urinary tract symptoms (LUTS)   . Personal history of colonic polyps 10/21/2006    hyperplastic   . Anemia   . Vitamin B12 deficiency   . Hiatal hernia   . Type II or unspecified type diabetes mellitus without mention of complication, not stated as uncontrolled     pt states that he checks his blood sugar once weekly, is not on oral meds or insulin  . Atrial fibrillation   . Sinus node dysfunction   . Presence of permanent cardiac  pacemaker 10/14/05    medtronic Adapta  . Hypertension     MEDICATIONS:  Prior to Admission medications   Medication Sig Start Date End Date Taking? Authorizing Provider  beta carotene w/minerals (OCUVITE) tablet Take 1 tablet by mouth daily.    Historical Provider, MD  Cyanocobalamin (VITAMIN B-12 IJ) Inject as directed. Once a month     Historical Provider, MD  dronedarone (MULTAQ) 400 MG tablet Take 1 tablet (400 mg total) by mouth 2 (two) times daily with a meal. 07/22/13   Mihai [name], MD  dronedarone (MULTAQ) 400 MG tablet Take 1 tablet (400 mg total) by mouth 2 (two) times daily with a meal. 09/08/13 09/20/13  Mihai [name], MD  metroNIDAZOLE (FLAGYL) 500 MG tablet Take 1 tablet (500 mg total) by mouth 3 (three) times daily. For 7 days. 08/31/13   Stephanie Coup Street, MD  nebivolol (BYSTOLIC) 10 MG tablet Take 1 tablet (10 mg total) by mouth daily. 12/10/11   Nestor Ramp, MD  pantoprazole (PROTONIX) 40 MG tablet Take 1 tablet (40 mg total) by mouth daily. 02/16/13   Nestor Ramp, MD  Probiotic Product ([facility] COLON HEALTH PO) Take by mouth daily.      Historical Provider, MD  ramipril (ALTACE) 10 MG capsule Take 1 capsule (10 mg total) by mouth daily. Generic preferred 02/16/13   Nestor Ramp, MD  Rivaroxaban (XARELTO) 15 MG TABS tablet Take 1 tablet (15 mg total) by mouth  daily. 06/28/13   Nestor Ramp, MD  tamsulosin [facility]) 0.4 MG CAPS capsule Take one tablet every other day 10/05/13   Nestor Ramp, MD    ALLERGIES:  Allergies  Allergen Reactions  . Codeine     REACTION: nausea    SOCIAL HISTORY:  History  Substance Use Topics  . Smoking status: Former Smoker    Quit date: 11/17/1969  . Smokeless tobacco: Never Used  . Alcohol Use: No     Comment: none since 1990    FAMILY HISTORY: Family History  Problem Relation Age of Onset  . Colon cancer Brother 24  . Hypertension Brother   . Hypertension Father   . Parkinsonism Mother   . Hodgkin's lymphoma Grandchild      EXAM: BP 150/58  Pulse 82  Temp(Src) 97.7 F (36.5 C) (Oral)  Wt 189 lb 3 oz (85.815 kg)  SpO2 99% CONSTITUTIONAL: Alert and oriented and responds appropriately to questions. Well-appearing; well-nourished HEAD: Normocephalic EYES: Conjunctivae clear, PERRL ENT: normal nose; no rhinorrhea; moist mucous membranes; pharynx without lesions noted NECK: Supple, no meningismus, no LAD  CARD: RRR; S1 and S2 appreciated; no murmurs, no clicks, no rubs, no gallops RESP: Normal chest excursion without splinting or tachypnea; breath sounds clear and equal bilaterally; no wheezes, no rhonchi, no rales,  ABD/GI: Normal bowel sounds; non-distended; soft, non-tender, no rebound, no guarding GU:  Normal external genitalia, no blood at urethral meatus, no penile discharge, no scrotal masses, no testicular pain or swelling, 2+ femoral pulses bilaterally BACK:  The back appears normal and is non-tender to palpation, there is no CVA tenderness EXT: Normal ROM in all joints; non-tender to palpation; no edema; normal capillary refill; no cyanosis    SKIN: Normal color for age and race; warm NEURO: Moves all extremities equally PSYCH: The patient's mood and manner are appropriate. Grooming and personal hygiene are appropriate.  MEDICAL DECISION MAKING: Patient here with one episode of gross hematuria that has resolved. He is otherwise well-appearing, hemodynamically stable. Basic labs are pending. Will also check urinalysis and urine culture to rule out UTI. I am not concerned for kidney stone at this time as he is not having any pain.  ED PROGRESS: Patient's labs are reassuring. His creatinine is at baseline. Patient appears to have a urinary tract infection. Urine culture pending. We'll discharge home with prescription for Bactrim. Given return precautions. We'll have him followup with his primary care physician next week for recheck. Patient and family at bedside verbalize understanding and are comfortable  with plan.     Layla Maw Ward, DO 10/14/13 [phone]

## 2013-10-17 ENCOUNTER — Encounter: Payer: Self-pay | Admitting: Family Medicine

## 2013-10-17 LAB — URINE CULTURE: Colony Count: >=100000

## 2013-10-17 NOTE — ED Notes (Signed)
Post ED Visit - Positive Culture Follow-up: Successful Patient Follow-Up  Culture assessed and recommendations reviewed by: []  Wes Dulaney, Pharm.D., BCPS [x]  Celedonio Miyamoto, [address].D., BCPS []  Georgina Pillion, Pharm.D., BCPS []  [place], [address].D., BCPS, AAHIVP []  Estella Husk, Pharm.D., BCPS, AAHIVP X] Treated with Bactrim, organism resistant to prescribed antimicrobial  [ ]  Patient discharged originally without antimicrobial agent and treatment is now indicated  New antibiotic prescription: amoxicillin 500mg  po BID x 7 days  ED Provider: Irish Elders, FNP-C   Called to [facility] by Sheralyn Boatman PF Patient informed of positive  results Larena Sox 10/17/2013, 5:22 PM

## 2013-10-17 NOTE — Progress Notes (Signed)
ED Antimicrobial Stewardship Positive Culture Follow Up   [name] is an 79 y.o. male who presented to [facility] on 10/14/2013 with a chief complaint of  Chief Complaint  Patient presents with  . Hematuria    Recent Results (from the past 720 hour(s))  URINE CULTURE     Status: None   Collection Time    10/14/13  6:26 PM      Result Value Range Status   Specimen Description URINE, CLEAN CATCH   Final   Special Requests NONE   Final   Culture  Setup Time     Final   Value: 10/15/2013 02:43     Performed at [facility] Count     Final   Value: >=100,000 COLONIES/ML     Performed at [facility]   Culture     Final   Value: ENTEROCOCCUS SPECIES     Performed at [facility]   Report Status 10/17/2013 FINAL   Final   Organism ID, Bacteria ENTEROCOCCUS SPECIES   Final    [x]  Treated with Bactrim, organism resistant to prescribed antimicrobial []  Patient discharged originally without antimicrobial agent and treatment is now indicated  New antibiotic prescription: amoxicillin 500mg  po BID x 7 days  ED Provider: Irish Elders, FNP-C   Mickeal Skinner 10/17/2013, 11:06 AM Infectious Diseases Pharmacist Phone# [phone]

## 2013-10-24 ENCOUNTER — Ambulatory Visit (INDEPENDENT_AMBULATORY_CARE_PROVIDER_SITE_OTHER): Payer: Medicare Other

## 2013-10-24 DIAGNOSIS — I495 Sick sinus syndrome: Secondary | ICD-10-CM

## 2013-10-24 LAB — MDC_IDC_ENUM_SESS_TYPE_REMOTE
Battery Impedance: 3382 Ohm
Battery Voltage: 2.67 V
Brady Statistic AP VP Percent: 99.1 %
Brady Statistic AP VS Percent: 0.1 % — CL
Brady Statistic AS VS Percent: 0.1 % — CL
Lead Channel Impedance Value: 520 Ohm
Lead Channel Pacing Threshold Amplitude: 0.375 V
Lead Channel Pacing Threshold Amplitude: 0.75 V
Lead Channel Setting Pacing Amplitude: 1.5 V
Lead Channel Setting Pacing Amplitude: 2.5 V
Lead Channel Setting Pacing Pulse Width: 0.4 ms
Lead Channel Setting Sensing Sensitivity: 5.6 mV

## 2013-10-24 LAB — PACEMAKER DEVICE OBSERVATION

## 2013-10-25 ENCOUNTER — Other Ambulatory Visit: Payer: Self-pay | Admitting: Family Medicine

## 2013-10-25 MED ORDER — TRAMADOL HCL 50 MG PO TABS: ORAL_TABLET | ORAL | Status: DC

## 2013-10-25 NOTE — Progress Notes (Signed)
Faxed form  Back to [facility] no refills

## 2013-10-29 ENCOUNTER — Encounter: Payer: Self-pay | Admitting: *Deleted

## 2013-11-04 ENCOUNTER — Ambulatory Visit (INDEPENDENT_AMBULATORY_CARE_PROVIDER_SITE_OTHER): Payer: Medicare Other | Admitting: *Deleted

## 2013-11-04 DIAGNOSIS — D518 Other vitamin B12 deficiency anemias: Secondary | ICD-10-CM

## 2013-11-04 MED ORDER — CYANOCOBALAMIN 1000 MCG/ML IJ SOLN
1000.0000 ug | Freq: Once | INTRAMUSCULAR | Status: AC
  Administered 2013-11-04: 1000 ug via INTRAMUSCULAR

## 2013-12-05 ENCOUNTER — Telehealth: Payer: Self-pay | Admitting: *Deleted

## 2013-12-05 ENCOUNTER — Ambulatory Visit (INDEPENDENT_AMBULATORY_CARE_PROVIDER_SITE_OTHER): Payer: Medicare Other | Admitting: *Deleted

## 2013-12-05 DIAGNOSIS — D518 Other vitamin B12 deficiency anemias: Secondary | ICD-10-CM

## 2013-12-05 DIAGNOSIS — N401 Enlarged prostate with lower urinary tract symptoms: Secondary | ICD-10-CM

## 2013-12-05 MED ORDER — CYANOCOBALAMIN 1000 MCG/ML IJ SOLN
1000.0000 ug | Freq: Once | INTRAMUSCULAR | Status: AC
  Administered 2013-12-05: 1000 ug via INTRAMUSCULAR

## 2013-12-05 NOTE — Progress Notes (Signed)
Patient in today for B12 injection. Injection given in right deltoid, patient without complaints, site unremarkable. Patient to return next month for next injection.

## 2013-12-05 NOTE — Telephone Encounter (Signed)
Patient comes in today asking for referral to urologist (prefers late morning appts), will forward to PCP.

## 2013-12-06 ENCOUNTER — Ambulatory Visit (INDEPENDENT_AMBULATORY_CARE_PROVIDER_SITE_OTHER): Payer: Medicare Other | Admitting: Gastroenterology

## 2013-12-06 ENCOUNTER — Encounter: Payer: Self-pay | Admitting: Gastroenterology

## 2013-12-06 ENCOUNTER — Other Ambulatory Visit: Payer: Medicare Other

## 2013-12-06 VITALS — BP 110/66 | HR 94 | Ht 66.0 in | Wt 191.2 lb

## 2013-12-06 DIAGNOSIS — I4891 Unspecified atrial fibrillation: Secondary | ICD-10-CM

## 2013-12-06 DIAGNOSIS — Z7901 Long term (current) use of anticoagulants: Secondary | ICD-10-CM

## 2013-12-06 DIAGNOSIS — R1032 Left lower quadrant pain: Secondary | ICD-10-CM

## 2013-12-06 DIAGNOSIS — K5730 Diverticulosis of large intestine without perforation or abscess without bleeding: Secondary | ICD-10-CM

## 2013-12-06 NOTE — Progress Notes (Signed)
This is a 80 year old Caucasian male with multiple functional GI complaints.  Now complains of some intermittent left lower quadrant pain and apparently his primary care physician ordered another CT scan of his abdomen that was unremarkable except for diverticulosis and a large stool burden.  He takes nightly MiraLax and tries to follow a high fiber diet.  He is up-to-date on his colonoscopy.  He complains of some dark stools.  He denies upper GI or l hepatobiliary complaints.  His appetite is good and his weight is stable.  He denies abuse of alcohol, cigarettes, or NSAIDs.    Current Medications, Allergies, Past Medical History, Past Surgical History, Family History and Social History were reviewed in [employer] record.  ROS: All systems were reviewed and are negative unless otherwise stated in the HPI.   Allergies  Allergen Reactions  . Codeine     REACTION: nausea   Outpatient Prescriptions Prior to Visit  Medication Sig Dispense Refill  . beta carotene w/minerals (OCUVITE) tablet Take 1 tablet by mouth daily.      . Cyanocobalamin (VITAMIN B-12 IJ) Inject as directed. Once a month. Take on the 19th of every month      . dronedarone (MULTAQ) 400 MG tablet Take 1 tablet (400 mg total) by mouth 2 (two) times daily with a meal.  180 tablet  3  . nebivolol (BYSTOLIC) 10 MG tablet Take 1 tablet (10 mg total) by mouth daily.  90 tablet  3  . pantoprazole (PROTONIX) 40 MG tablet Take 1 tablet (40 mg total) by mouth daily.  90 tablet  3  . Probiotic Product ([place]) Take by mouth daily.        . ramipril (ALTACE) 10 MG capsule Take 1 capsule (10 mg total) by mouth daily. Generic preferred  90 capsule  3  . Rivaroxaban (XARELTO) 15 MG TABS tablet Take 1 tablet (15 mg total) by mouth daily.  90 tablet  3  . tamsulosin (FLOMAX) 0.4 MG CAPS capsule Take 0.4 mg by mouth every other day. Take one tablet every other day      . traMADol (ULTRAM) 50 MG tablet Take  one PO q 8 prn pain  90 tablet  0  . dronedarone (MULTAQ) 400 MG tablet Take 1 tablet (400 mg total) by mouth 2 (two) times daily with a meal.  24 tablet  0  . sulfamethoxazole-trimethoprim (BACTRIM DS) 800-160 MG per tablet Take 1 tablet by mouth 2 (two) times daily.  14 tablet  0   Facility-Administered Medications Prior to Visit  Medication Dose Route Frequency Provider Last Rate Last Dose  . 0.9 %  sodium chloride infusion    Continuous PRN Eligha Bridegroom, CRNA       Past Medical History  Diagnosis Date  . Overactive bladder   . Arthritis   . Diverticulosis of colon (without mention of hemorrhage)   . Gout   . Chronic kidney disease, stage III (moderate)   . Family history of malignant neoplasm of gastrointestinal tract   . Stricture and stenosis of esophagus   . Acute gastritis without mention of hemorrhage   . Unspecified cataract   . Hypertrophy of prostate with urinary obstruction and other lower urinary tract symptoms (LUTS)   . Personal history of colonic polyps 10/21/2006    hyperplastic   . Anemia   . Vitamin B12 deficiency   . Hiatal hernia   . Type II or unspecified type diabetes mellitus without mention  of complication, not stated as uncontrolled     pt states that he checks his blood sugar once weekly, is not on oral meds or insulin  . Atrial fibrillation   . Sinus node dysfunction   . Presence of permanent cardiac pacemaker 10/14/05    medtronic Adapta  . Hypertension    Past Surgical History  Procedure Laterality Date  . Appendectomy    . Tonsillectomy    . Pacemaker insertion    . Total hip arthroplasty    . Cardioversion  10/01/2011    Procedure: CARDIOVERSION;  Surgeon: Dani Gobble [name];  Location: [facility] OR;  Service: Cardiovascular;  Laterality: N/A;  OP CARDIOVERSION  . Cardioversion  11/02/12    successful   History   Social History  . Marital Status: Widowed    Spouse Name: N/A    Number of Children: N/A  . Years of Education: N/A   Occupational  History  . retired    Social History Main Topics  . Smoking status: Former Smoker    Quit date: 11/17/1969  . Smokeless tobacco: Never Used  . Alcohol Use: No     Comment: none since 1990  . Drug Use: No  . Sexual Activity: None   Other Topics Concern  . None   Social History Narrative  . None   Family History  Problem Relation Age of Onset  . Colon cancer Brother 63  . Hypertension Brother   . Hypertension Father   . Parkinsonism Mother   . Hodgkin's lymphoma Grandchild             Physical Exam: Healthy-appearing 80 year old.  Blood pressure 110/66, pulse 94 and irregular and weight 191.  His abdomen shows no organomegaly, masses, localized tenderness.  Bowel sounds are normal mental status is normal    Assessment and symptomatic diverticulosis with associated chronic constipation in a 80 year old patient.  We will check IFOB stool cards and also COLOGUARD stool tests for DNA since he has a family history of colon cancer in his brother.  I placed him on twice a day Benefiber, liberal by mouth fluids, and he is to continue nightly MiraLax.  I do not think we should prescribe any additional medications in this elderly patient with multiple functional complaints.  He does not complain about his functional swallowing difficulties at this time.  He's had several endoscopies and esophageal manometry is acute nonspecific esophageal motility disorder.  If his COLOGUARD or his IFOB are positive he would need colonoscopy after counseling because of his age and his chronic atrial fibrillation and chronic anticoagulation.  Recent hemoglobin was 12.2 with normochromic normocytic indices.

## 2013-12-06 NOTE — Patient Instructions (Addendum)
Please purchase Benefiber over the counter and take twice daily.  You will receive a call from [facility] regarding your Cologuard Stool Test    Your physician has requested that you go to the basement for the following lab work before leaving today: IFOB  _________________________________________________________________________________________  Diverticulosis Diverticulosis is a common condition that develops when small pouches (diverticula) form in the wall of the colon. The risk of diverticulosis increases with age. It happens more often in people who eat a low-fiber diet. Most individuals with diverticulosis have no symptoms. Those individuals with symptoms usually experience abdominal pain, constipation, or loose stools (diarrhea). HOME CARE INSTRUCTIONS   Increase the amount of fiber in your diet as directed by your caregiver or dietician. This may reduce symptoms of diverticulosis.  Your caregiver may recommend taking a dietary fiber supplement.  Drink at least 6 to 8 glasses of water each day to prevent constipation.  Try not to strain when you have a bowel movement.  Your caregiver may recommend avoiding nuts and seeds to prevent complications, although this is still an uncertain benefit.  Only take over-the-counter or prescription medicines for pain, discomfort, or fever as directed by your caregiver. FOODS WITH HIGH FIBER CONTENT INCLUDE:  Fruits. Apple, peach, pear, tangerine, raisins, prunes.  Vegetables. Brussels sprouts, asparagus, broccoli, cabbage, carrot, cauliflower, romaine lettuce, spinach, summer squash, tomato, winter squash, zucchini.  Starchy Vegetables. Baked beans, kidney beans, lima beans, split peas, lentils, potatoes (with skin).  Grains. Whole wheat bread, brown rice, bran flake cereal, plain oatmeal, white rice, shredded wheat, bran muffins. SEEK IMMEDIATE MEDICAL CARE IF:   You develop increasing pain or severe bloating.  You have an oral  temperature above 102 F (38.9 C), not controlled by medicine.  You develop vomiting or bowel movements that are bloody or black. Document Released: 07/31/2004 Document Revised: 01/26/2012 Document Reviewed: 04/03/2010 [facility] Patient Information 2014 [place].

## 2013-12-09 ENCOUNTER — Telehealth: Payer: Self-pay | Admitting: *Deleted

## 2013-12-09 ENCOUNTER — Other Ambulatory Visit (INDEPENDENT_AMBULATORY_CARE_PROVIDER_SITE_OTHER): Payer: Medicare Other

## 2013-12-09 DIAGNOSIS — R1032 Left lower quadrant pain: Secondary | ICD-10-CM

## 2013-12-09 DIAGNOSIS — I4891 Unspecified atrial fibrillation: Secondary | ICD-10-CM

## 2013-12-09 DIAGNOSIS — K5730 Diverticulosis of large intestine without perforation or abscess without bleeding: Secondary | ICD-10-CM

## 2013-12-09 DIAGNOSIS — Z7901 Long term (current) use of anticoagulants: Secondary | ICD-10-CM

## 2013-12-09 LAB — FECAL OCCULT BLOOD, IMMUNOCHEMICAL: Fecal Occult Bld: POSITIVE — AB

## 2013-12-09 NOTE — Telephone Encounter (Signed)
Informed daughter in law that Dr Sharlett Iles wants to see the pt on 12/16/13. She stated understanding,

## 2013-12-09 NOTE — Telephone Encounter (Signed)
Message copied by Lance Morin on Fri Dec 09, 2013  3:06 PM ------      Message from: PATTERSON, DAVID R      Created: Fri Dec 09, 2013  2:44 PM       Refer case to Dr. Henrene Pastor per possible colonoscopy..inhaled corticosteroids Cologuard done ??? ------

## 2013-12-09 NOTE — Telephone Encounter (Signed)
Dr Sharlett Iles called back again to schedule pt to see him next Friday. lmom for pt to call back.

## 2013-12-16 ENCOUNTER — Ambulatory Visit (INDEPENDENT_AMBULATORY_CARE_PROVIDER_SITE_OTHER): Payer: Medicare Other | Admitting: Gastroenterology

## 2013-12-16 ENCOUNTER — Encounter: Payer: Self-pay | Admitting: Gastroenterology

## 2013-12-16 VITALS — BP 140/78 | HR 70 | Ht 65.5 in | Wt 189.0 lb

## 2013-12-16 DIAGNOSIS — K5900 Constipation, unspecified: Secondary | ICD-10-CM

## 2013-12-16 DIAGNOSIS — K5730 Diverticulosis of large intestine without perforation or abscess without bleeding: Secondary | ICD-10-CM

## 2013-12-16 DIAGNOSIS — Z7901 Long term (current) use of anticoagulants: Secondary | ICD-10-CM

## 2013-12-16 NOTE — Progress Notes (Signed)
This is a 80 year old male with chronic functional constipation recurrent diverticulitis-diverticulosis.  He is doing much better currently on daily MiraLax and Benefiber twice a day.  He denies abdominal pain or rectal bleeding.  There is a vague history of his brother possibly having colon polyps.  Patient had an incomplete colonoscopy in 2007.  He specifically denies bright red blood per rectum.  Hemoccult cards apparently returned guaiac-positive, but I did Hemoccult card today and its office and it was negative.  He was placed due to a Cologuard stool DNA test for colon cancer which has not been completed.  He denies upper GI or hepatobiliary complaints.  He is on chronic  Xarelto per cardiology for his atrial fibrillation.  Today denies a specific cardiovascular problems.   Current Medications, Allergies, Past Medical History, Past Surgical History, Family History and Social History were reviewed in [employer] record.  ROS: All systems were reviewed and are negative unless otherwise stated in the HPI.          Physical Exam: Healthy-appearing elderly patient in no acute distress.  Blood pressure 140/78, pulse 70 and regular and weight 189.  His abdomen is benign without organomegaly, masses, or tenderness.  Bowel sounds are normal.  Rectal exam shows soft formed stool in the rectal vault which is guaiac negative.  There no rectal masses or tenderness.  Mental status is normal.  Review of labs shows no evidence of significant anemia.   Assessment and Plan: Chronic diverticulosis with chronic constipation and chronic lower GI symptomatology.  Colonoscopy this patient would be high risk with his anticoagulation atrial fib.  I will not proceed with colonoscopy we have the Cologuard DNA stool results available.  Previous attempts at colonoscopy in 2007 were only partially successful because of poor prep.  I reviewed his CT scan from recent October of last year he shows no  colonic masses or worrisome lesions.  Despite his negative Hemoccults as an outpatient, I had to digital Hemoccult in the office and was stool appears entirely normal.  We will hold colonoscopy until Cologuard results available.  The patient and his door alarm not anxious to have colonoscopy performed at his age which is reasonable and with his multiple cardiovascular issues.  I'll have him followup with one of our regular physicians upon my retirement.  He is to continue a high-fiber diet, twice a day Benefiber and nightly MiraLax in the interim along with all his other medications as listed and reviewed.

## 2013-12-16 NOTE — Patient Instructions (Signed)
Gave [employer] daughter in law information to call her in regards to Cologuard Stool Test If you do not hear from them in one week please call me back(Debrina Kizer, [place]) [phone]

## 2013-12-28 ENCOUNTER — Encounter: Payer: Self-pay | Admitting: Family Medicine

## 2013-12-28 ENCOUNTER — Other Ambulatory Visit: Payer: Self-pay | Admitting: Family Medicine

## 2013-12-28 ENCOUNTER — Telehealth: Payer: Self-pay | Admitting: Family Medicine

## 2013-12-28 ENCOUNTER — Ambulatory Visit (INDEPENDENT_AMBULATORY_CARE_PROVIDER_SITE_OTHER): Payer: Medicare Other | Admitting: *Deleted

## 2013-12-28 DIAGNOSIS — R300 Dysuria: Secondary | ICD-10-CM

## 2013-12-28 DIAGNOSIS — R319 Hematuria, unspecified: Secondary | ICD-10-CM

## 2013-12-28 LAB — POCT UA - MICROSCOPIC ONLY

## 2013-12-28 LAB — POCT URINALYSIS DIPSTICK
BILIRUBIN UA: NEGATIVE
Glucose, UA: NEGATIVE
Ketones, UA: NEGATIVE
NITRITE UA: NEGATIVE
Protein, UA: 100
Spec Grav, UA: 1.02
Urobilinogen, UA: 1
pH, UA: 7

## 2013-12-28 MED ORDER — CEPHALEXIN 500 MG PO CAPS: 500.0000 mg | ORAL_CAPSULE | Freq: Three times a day (TID) | ORAL | Status: AC

## 2013-12-28 NOTE — Telephone Encounter (Signed)
Urine dipstick final report reviewed,urine appears red,patient likely having gross hematuria.I called and inform him to hold his Xarelto. I will recommend repeat UA during his visit tomorrow morning,if still reddish appearance would need urology referral. I will talk to the cross cover resident tomorrow. Patient instructed today to go to the ED if symptom worsens.

## 2013-12-28 NOTE — Progress Notes (Addendum)
Patient ID: [name], male   DOB: Jan 07, 1920, 80 y.o.   MRN: [id number] Patient seen by nurse for dysuria and hematuria for few days. Instruction given to obtain UA, if positive will give A/B pending culture report. Ideally it would have been best for him to be seen by a provider today but he came in around 4:30 and noone has an opened schedule. Plan to f/u as early as possible tomorrow or if symptom worsens to go to the ED.  NB: Preliminary Urine Dipstick result: Large blood,moderate leukocyte, + bacteria,?? Leukocyte.       Plan to culture urine and start patient on Keflex, his last urine culture was positive for enterococcus which was sensitive to Levaquin and Ampicillin.      He has appointment to be seen in our clinic tomorrow morning.  Addendum: Urine dipstick final report reviewed,urine appears red,patient likely having gross hematuria.I called and inform him to hold his Xarelto. I will recommend repeat UA during his visit tomorrow morning,if still reddish appearance would need urology referral. I will talk to the cross cover resident tomorrow. Patient instructed today to go to the ED if symptom worsens.

## 2013-12-28 NOTE — Progress Notes (Signed)
   Pt walked into clinic complaining of painful and bloody urine.  An UA was ordered per Dr. Gwendlyn Deutscher.  Keflex 500mg  was ordered and appt made for 9:15 AM 12/29/2013.  Derl Barrow, RN

## 2013-12-29 ENCOUNTER — Encounter: Payer: Self-pay | Admitting: Family Medicine

## 2013-12-29 ENCOUNTER — Ambulatory Visit (INDEPENDENT_AMBULATORY_CARE_PROVIDER_SITE_OTHER): Payer: Medicare Other | Admitting: Family Medicine

## 2013-12-29 ENCOUNTER — Other Ambulatory Visit: Payer: Medicare Other

## 2013-12-29 ENCOUNTER — Ambulatory Visit: Payer: Medicare Other

## 2013-12-29 VITALS — BP 157/74 | HR 82 | Temp 97.5°F | Wt 190.0 lb

## 2013-12-29 DIAGNOSIS — R319 Hematuria, unspecified: Secondary | ICD-10-CM

## 2013-12-29 DIAGNOSIS — R310 Gross hematuria: Secondary | ICD-10-CM

## 2013-12-29 LAB — CBC WITH DIFFERENTIAL/PLATELET
Basophils Absolute: 0 10*3/uL (ref 0.0–0.1)
Basophils Relative: 0 % (ref 0–1)
EOS ABS: 0.2 10*3/uL (ref 0.0–0.7)
EOS PCT: 3 % (ref 0–5)
HCT: 42 % (ref 39.0–52.0)
Hemoglobin: 13.8 g/dL (ref 13.0–17.0)
LYMPHS ABS: 1.2 10*3/uL (ref 0.7–4.0)
Lymphocytes Relative: 15 % (ref 12–46)
MCH: 30.5 pg (ref 26.0–34.0)
MCHC: 32.9 g/dL (ref 30.0–36.0)
MCV: 92.9 fL (ref 78.0–100.0)
Monocytes Absolute: 0.5 10*3/uL (ref 0.1–1.0)
Monocytes Relative: 6 % (ref 3–12)
Neutro Abs: 5.9 10*3/uL (ref 1.7–7.7)
Neutrophils Relative %: 76 % (ref 43–77)
Platelets: 193 10*3/uL (ref 150–400)
RBC: 4.52 MIL/uL (ref 4.22–5.81)
RDW: 14.2 % (ref 11.5–15.5)
WBC: 7.8 10*3/uL (ref 4.0–10.5)

## 2013-12-29 LAB — POCT UA - MICROSCOPIC ONLY: WBC, Ur, HPF, POC: >20

## 2013-12-29 LAB — POCT URINALYSIS DIPSTICK
Bilirubin, UA: NEGATIVE
Glucose, UA: NEGATIVE
Ketones, UA: NEGATIVE
NITRITE UA: NEGATIVE
PH UA: 7
Protein, UA: NEGATIVE
Spec Grav, UA: 1.015
UROBILINOGEN UA: 0.2

## 2013-12-29 LAB — BASIC METABOLIC PANEL
BUN: 18 mg/dL (ref 6–23)
CHLORIDE: 99 meq/L (ref 96–112)
CO2: 29 meq/L (ref 19–32)
CREATININE: 1.46 mg/dL — AB (ref 0.50–1.35)
Calcium: 9.5 mg/dL (ref 8.4–10.5)
GLUCOSE: 140 mg/dL — AB (ref 70–99)
POTASSIUM: 5.1 meq/L (ref 3.5–5.3)
Sodium: 136 mEq/L (ref 135–145)

## 2013-12-29 NOTE — Progress Notes (Signed)
[name] is a 80 y.o. male who presents today for hematuria.  Pt seen in clinic yesterday with dysuria found to have UTI and large amount of Hgb and RBCs on micro.  He is followed by Dr. Matilde Sprang at [facility], last seen on 12/19/13, however, prior to this had been five yrs.  Started on Myrbetriq at that time for urge incontinence, but otherwise no new medications.  Has hx of microscopic hematuria dating back to 2012 and smoking history of about 25 pk yr history, quit in 1970.  Past Medical History  Diagnosis Date  . Overactive bladder   . Arthritis   . Diverticulosis of colon (without mention of hemorrhage)   . Gout   . Chronic kidney disease, stage III (moderate)   . Family history of malignant neoplasm of gastrointestinal tract   . Stricture and stenosis of esophagus   . Acute gastritis without mention of hemorrhage   . Unspecified cataract   . Hypertrophy of prostate with urinary obstruction and other lower urinary tract symptoms (LUTS)   . Personal history of colonic polyps 10/21/2006    hyperplastic   . Anemia   . Vitamin B12 deficiency   . Hiatal hernia   . Type II or unspecified type diabetes mellitus without mention of complication, not stated as uncontrolled     pt states that he checks his blood sugar once weekly, is not on oral meds or insulin  . Atrial fibrillation   . Sinus node dysfunction   . Presence of permanent cardiac pacemaker 10/14/05    medtronic Adapta  . Hypertension     History  Smoking status  . Former Smoker  . Quit date: 11/17/1969  Smokeless tobacco  . Never Used    Family History  Problem Relation Age of Onset  . Colon cancer Brother 80  . Hypertension Brother   . Hypertension Father   . Parkinsonism Mother   . Hodgkin's lymphoma Grandchild     Current Outpatient Prescriptions on File Prior to Visit  Medication Sig Dispense Refill  . beta carotene w/minerals (OCUVITE) tablet Take 1 tablet by mouth daily.      . cephALEXin  (KEFLEX) 500 MG capsule Take 1 capsule (500 mg total) by mouth 3 (three) times daily.  21 capsule  0  . Cyanocobalamin (VITAMIN B-12 IJ) Inject as directed. Once a month. Take on the 19th of every month      . dronedarone (MULTAQ) 400 MG tablet Take 1 tablet (400 mg total) by mouth 2 (two) times daily with a meal.  180 tablet  3  . nebivolol (BYSTOLIC) 10 MG tablet Take 1 tablet (10 mg total) by mouth daily.  90 tablet  3  . pantoprazole (PROTONIX) 40 MG tablet Take 1 tablet (40 mg total) by mouth daily.  90 tablet  3  . Probiotic Product ([place]) Take by mouth daily.        . ramipril (ALTACE) 10 MG capsule Take 1 capsule (10 mg total) by mouth daily. Generic preferred  90 capsule  3  . Rivaroxaban (XARELTO) 15 MG TABS tablet Take 1 tablet (15 mg total) by mouth daily.  90 tablet  3  . tamsulosin (FLOMAX) 0.4 MG CAPS capsule Take 0.4 mg by mouth every other day. Take one tablet every other day      . traMADol (ULTRAM) 50 MG tablet Take one PO q 8 prn pain  90 tablet  0   Current Facility-Administered Medications on  File Prior to Visit  Medication Dose Route Frequency Provider Last Rate Last Dose  . 0.9 %  sodium chloride infusion    Continuous PRN Eligha Bridegroom, CRNA        ROS: Per HPI.  All other systems reviewed and are negative.   Physical Exam Filed Vitals:   12/29/13 0939  BP: 157/74  Pulse: 82  Temp: 97.5 F (36.4 C)    Physical Examination: General appearance - alert, well appearing, and in no distress Chest - clear to auscultation, no wheezes, rales or rhonchi, symmetric air entry Heart - irregularly irregular rhythm with rate regular.  No appreciated murmurs  Abdomen - soft, nontender, nondistended, no masses or organomegaly Back exam - No CVA tenderness B/L     Chemistry      Component Value Date/Time   NA 132* 10/14/2013 1720   K 4.8 10/14/2013 1720   CL 101 10/14/2013 1720   CO2 25 10/14/2013 1720   BUN 25* 10/14/2013 1720   CREATININE 1.43*  10/14/2013 1720   CREATININE 1.29 10/05/2013 1011      Component Value Date/Time   CALCIUM 8.6 10/14/2013 1720   ALKPHOS 53 10/05/2013 1011   AST 14 10/05/2013 1011   ALT 13 10/05/2013 1011   BILITOT 0.6 10/05/2013 1011      Lab Results  Component Value Date   WBC 6.8 10/14/2013   HGB 12.2* 10/14/2013   HCT 35.1* 10/14/2013   MCV 91.6 10/14/2013   PLT 170 10/14/2013

## 2013-12-29 NOTE — Patient Instructions (Signed)
[name], please stop taking your xarelto and aspirin.  As well, please keep your appointment with Dr. Matilde Sprang, we will see you back about one week after this.  Thanks, Dr. Awanda Mink   Hematuria, Adult Hematuria is blood in your urine.  SEEK IMMEDIATE MEDICAL CARE IF:   You have a persistent fever, with a temperature of 101.79F (38.8C) or greater.  You develop severe vomiting and are unable to keep the medicine down.  You develop severe back or abdominal pain despite taking your medicines.  You begin passing a large amount of blood or clots in your urine.  You feel extremely weak or faint, or you pass out. MAKE SURE YOU:   Understand these instructions.  Will watch your condition.  Will get help right away if you are not doing well or get worse.

## 2013-12-29 NOTE — Assessment & Plan Note (Signed)
Pt seen in clinic yesterday with dysuria found to have UTI and large amount of Hgb and RBCs on micro.  He is followed by Dr. Matilde Sprang at [facility], last seen on 12/19/13, however, prior to this had been five yrs.  Started on Myrbetriq at that time for urge incontinence, but otherwise no new medications.  Has hx of microscopic hematuria dating back to 2012 and smoking history of about 25 pk yr history, quit in 1970.  Discussed case with Dr. Matilde Sprang, appointment scheduled for March 2nd 2015 at which time pt will be scheduled for cystoscopy.  Until this point, discussed risks and benefits of continuing xarelto and ASA in pt with AF, and states he has a recent + FOBT as well.  With the large amount of Hgb in his urine, we decided on stopping his Xarelto and ASA for now and will consider restarting one or both depending on his outcome from cystoscopy.  Will also obtain BMET and CBC to evaluate for renal failure and anemia today.  Pt and his daughter in law understand discussion and red flags outlined today.   F/U in 3 weeks.

## 2013-12-30 ENCOUNTER — Emergency Department (HOSPITAL_COMMUNITY)
Admission: EM | Admit: 2013-12-30 | Discharge: 2013-12-30 | Disposition: A | Discharge status: Home / Self Care | Payer: Medicare Other | Attending: Emergency Medicine | Admitting: Emergency Medicine

## 2013-12-30 ENCOUNTER — Encounter (HOSPITAL_COMMUNITY): Payer: Self-pay | Admitting: Emergency Medicine

## 2013-12-30 DIAGNOSIS — R310 Gross hematuria: Secondary | ICD-10-CM

## 2013-12-30 DIAGNOSIS — Z87891 Personal history of nicotine dependence: Secondary | ICD-10-CM | POA: Insufficient documentation

## 2013-12-30 DIAGNOSIS — Z792 Long term (current) use of antibiotics: Secondary | ICD-10-CM | POA: Insufficient documentation

## 2013-12-30 DIAGNOSIS — Z79899 Other long term (current) drug therapy: Secondary | ICD-10-CM | POA: Insufficient documentation

## 2013-12-30 DIAGNOSIS — Z8669 Personal history of other diseases of the nervous system and sense organs: Secondary | ICD-10-CM | POA: Insufficient documentation

## 2013-12-30 DIAGNOSIS — Z860100 Personal history of colon polyps, unspecified: Secondary | ICD-10-CM | POA: Insufficient documentation

## 2013-12-30 DIAGNOSIS — R319 Hematuria, unspecified: Secondary | ICD-10-CM | POA: Insufficient documentation

## 2013-12-30 DIAGNOSIS — N138 Other obstructive and reflux uropathy: Secondary | ICD-10-CM | POA: Insufficient documentation

## 2013-12-30 DIAGNOSIS — N183 Chronic kidney disease, stage 3 unspecified: Secondary | ICD-10-CM | POA: Insufficient documentation

## 2013-12-30 DIAGNOSIS — Z862 Personal history of diseases of the blood and blood-forming organs and certain disorders involving the immune mechanism: Secondary | ICD-10-CM | POA: Insufficient documentation

## 2013-12-30 DIAGNOSIS — Z8719 Personal history of other diseases of the digestive system: Secondary | ICD-10-CM | POA: Insufficient documentation

## 2013-12-30 DIAGNOSIS — Z8739 Personal history of other diseases of the musculoskeletal system and connective tissue: Secondary | ICD-10-CM | POA: Insufficient documentation

## 2013-12-30 DIAGNOSIS — I4891 Unspecified atrial fibrillation: Secondary | ICD-10-CM | POA: Insufficient documentation

## 2013-12-30 DIAGNOSIS — N401 Enlarged prostate with lower urinary tract symptoms: Secondary | ICD-10-CM | POA: Insufficient documentation

## 2013-12-30 DIAGNOSIS — I129 Hypertensive chronic kidney disease with stage 1 through stage 4 chronic kidney disease, or unspecified chronic kidney disease: Secondary | ICD-10-CM | POA: Insufficient documentation

## 2013-12-30 DIAGNOSIS — E119 Type 2 diabetes mellitus without complications: Secondary | ICD-10-CM | POA: Insufficient documentation

## 2013-12-30 DIAGNOSIS — Z950 Presence of cardiac pacemaker: Secondary | ICD-10-CM | POA: Insufficient documentation

## 2013-12-30 DIAGNOSIS — Z7901 Long term (current) use of anticoagulants: Secondary | ICD-10-CM | POA: Insufficient documentation

## 2013-12-30 LAB — COMPREHENSIVE METABOLIC PANEL
ALBUMIN: 3.8 g/dL (ref 3.5–5.2)
ALT: 13 U/L (ref 0–53)
AST: 13 U/L (ref 0–37)
Alkaline Phosphatase: 54 U/L (ref 39–117)
BUN: 20 mg/dL (ref 6–23)
CO2: 24 mEq/L (ref 19–32)
Calcium: 8.9 mg/dL (ref 8.4–10.5)
Chloride: 97 mEq/L (ref 96–112)
Creatinine, Ser: 1.45 mg/dL — ABNORMAL HIGH (ref 0.50–1.35)
GFR calc Af Amer: 47 mL/min — ABNORMAL LOW (ref >90)
GFR calc non Af Amer: 41 mL/min — ABNORMAL LOW (ref >90)
Glucose, Bld: 117 mg/dL — ABNORMAL HIGH (ref 70–99)
POTASSIUM: 4.8 meq/L (ref 3.7–5.3)
Sodium: 135 mEq/L — ABNORMAL LOW (ref 137–147)
TOTAL PROTEIN: 6.7 g/dL (ref 6.0–8.3)
Total Bilirubin: 0.4 mg/dL (ref 0.3–1.2)

## 2013-12-30 LAB — URINE MICROSCOPIC-ADD ON

## 2013-12-30 LAB — CBC
HEMATOCRIT: 37 % — AB (ref 39.0–52.0)
Hemoglobin: 12.7 g/dL — ABNORMAL LOW (ref 13.0–17.0)
MCH: 31.5 pg (ref 26.0–34.0)
MCHC: 34.3 g/dL (ref 30.0–36.0)
MCV: 91.8 fL (ref 78.0–100.0)
Platelets: 177 10*3/uL (ref 150–400)
RBC: 4.03 MIL/uL — ABNORMAL LOW (ref 4.22–5.81)
RDW: 13.7 % (ref 11.5–15.5)
WBC: 7.5 10*3/uL (ref 4.0–10.5)

## 2013-12-30 LAB — URINALYSIS, ROUTINE W REFLEX MICROSCOPIC
BILIRUBIN URINE: NEGATIVE
GLUCOSE, UA: NEGATIVE mg/dL
Ketones, ur: NEGATIVE mg/dL
NITRITE: NEGATIVE
PH: 6 (ref 5.0–8.0)
PROTEIN: 30 mg/dL — AB
Specific Gravity, Urine: 1.013 (ref 1.005–1.030)
Urobilinogen, UA: 0.2 mg/dL (ref 0.0–1.0)

## 2013-12-30 LAB — URINE CULTURE

## 2013-12-30 NOTE — ED Notes (Signed)
Bladder Scan: 208 cc. Dr. Sharol Given made aware.

## 2013-12-30 NOTE — ED Notes (Signed)
Family at bedside. 

## 2013-12-30 NOTE — ED Notes (Addendum)
Pt. reports hematuria onset Tuesday , seen by his PCP ( Dr. Awanda Mink ) yesterday , urinalysis and blood test done , diagnosed with UTI/Hematuria  - prescribed with Keflex antibiotic .

## 2013-12-30 NOTE — Discharge Instructions (Signed)
Please contact the urology office later this morning for evaluation in their office, either today or Monday.  If you have problems.  Over the weekend, please go [facility] long emergency department as this is the preferred location for our urology group.   Hematuria, Adult Hematuria is blood in your urine. It can be caused by a bladder infection, kidney infection, prostate infection, kidney stone, or cancer of your urinary tract. Infections can usually be treated with medicine, and a kidney stone usually will pass through your urine. If neither of these is the cause of your hematuria, further workup to find out the reason may be needed. It is very important that you tell your health care provider about any blood you see in your urine, even if the blood stops without treatment or happens without causing pain. Blood in your urine that happens and then stops and then happens again can be a symptom of a very serious condition. Also, pain is not a symptom in the initial stages of many urinary cancers. HOME CARE INSTRUCTIONS   Drink lots of fluid, 3 4 quarts a day. If you have been diagnosed with an infection, cranberry juice is especially recommended, in addition to large amounts of water.  Avoid caffeine, tea, and carbonated beverages, because they tend to irritate the bladder.  Avoid alcohol because it may irritate the prostate.  Only take over-the-counter or prescription medicines for pain, discomfort, or fever as directed by your health care provider.  If you have been diagnosed with a kidney stone, follow your health care provider's instructions regarding straining your urine to catch the stone.  Empty your bladder often. Avoid holding urine for long periods of time.  After a bowel movement, women should cleanse front to back. Use each tissue only once.  Empty your bladder before and after sexual intercourse if you are a male. SEEK MEDICAL CARE IF: You develop back pain, fever, a feeling of  sickness in your stomach (nausea), or vomiting or if your symptoms are not better in 3 days. Return sooner if you are getting worse. SEEK IMMEDIATE MEDICAL CARE IF:   You have a persistent fever, with a temperature of 101.59F (38.8C) or greater.  You develop severe vomiting and are unable to keep the medicine down.  You develop severe back or abdominal pain despite taking your medicines.  You begin passing a large amount of blood or clots in your urine.  You feel extremely weak or faint, or you pass out. MAKE SURE YOU:   Understand these instructions.  Will watch your condition.  Will get help right away if you are not doing well or get worse. Document Released: 11/03/2005 Document Revised: 08/24/2013 Document Reviewed: 07/04/2013 [facility] Patient Information 2014 [place].

## 2013-12-30 NOTE — ED Provider Notes (Signed)
CSN: [id number]     Arrival date & time 12/30/13  0047 History   First MD Initiated Contact with Patient 12/30/13 [phone]     Chief Complaint  Patient presents with  . Hematuria     (Consider location/radiation/quality/duration/timing/severity/associated sxs/prior Treatment) HPI 80 year old male presents to emergency department with complaint of persistent and worsening hematuria.  Patient was seen by Dr. Vikki Ports on February 2, 11 days ago for worsening incontinence, and placed on new medication, mirabegron.    Starting on Tuesday, patient began to see blood in his urine.  He was seen briefly by a provider at the family practice office on Wednesday, and seen again formally on Thursday.  He was taken off his aspirin and xarelto due to gross hematuria and started on Keflex for possible urinary tract infection.  Consult was made to Dr. Vikki Ports, who has scheduled patient to be seen on March 2 for cystoscopy.  Since being seen at the practice office this morning, patient reports he has had increased bloody urine, occasionally with clots.  He feels that occasionally he has problems passing all of his urine due to blood clots.  He reports he had brief period of dizziness today, but after standing for a few minutes.  This resolved.  No chest pain or shortness of breath.  No abdominal pain. Past Medical History  Diagnosis Date  . Overactive bladder   . Arthritis   . Diverticulosis of colon (without mention of hemorrhage)   . Gout   . Chronic kidney disease, stage III (moderate)   . Family history of malignant neoplasm of gastrointestinal tract   . Stricture and stenosis of esophagus   . Acute gastritis without mention of hemorrhage   . Unspecified cataract   . Hypertrophy of prostate with urinary obstruction and other lower urinary tract symptoms (LUTS)   . Personal history of colonic polyps 10/21/2006    hyperplastic   . Anemia   . Vitamin B12 deficiency   . Hiatal hernia   . Type II or  unspecified type diabetes mellitus without mention of complication, not stated as uncontrolled     pt states that he checks his blood sugar once weekly, is not on oral meds or insulin  . Atrial fibrillation   . Sinus node dysfunction   . Presence of permanent cardiac pacemaker 10/14/05    medtronic Adapta  . Hypertension    Past Surgical History  Procedure Laterality Date  . Appendectomy    . Tonsillectomy    . Pacemaker insertion    . Total hip arthroplasty    . Cardioversion  10/01/2011    Procedure: CARDIOVERSION;  Surgeon: Dani Gobble [name];  Location: [facility] OR;  Service: Cardiovascular;  Laterality: N/A;  OP CARDIOVERSION  . Cardioversion  11/02/12    successful   Family History  Problem Relation Age of Onset  . Colon cancer Brother 7  . Hypertension Brother   . Hypertension Father   . Parkinsonism Mother   . Hodgkin's lymphoma Grandchild    History  Substance Use Topics  . Smoking status: Former Smoker    Quit date: 11/17/1969  . Smokeless tobacco: Never Used  . Alcohol Use: No     Comment: none since 1990    Review of Systems   See History of Present Illness; otherwise all other systems are reviewed and negative  Allergies  Codeine  Home Medications   Current Outpatient Rx  Name  Route  Sig  Dispense  Refill  . beta carotene w/minerals (  OCUVITE) tablet   Oral   Take 1 tablet by mouth daily.         . cephALEXin (KEFLEX) 500 MG capsule   Oral   Take 1 capsule (500 mg total) by mouth 3 (three) times daily.   21 capsule   0   . dronedarone (MULTAQ) 400 MG tablet   Oral   Take 1 tablet (400 mg total) by mouth 2 (two) times daily with a meal.   180 tablet   3   . mirabegron ER (MYRBETRIQ) 25 MG TB24 tablet   Oral   Take 25 mg by mouth daily.         . nebivolol (BYSTOLIC) 10 MG tablet   Oral   Take 1 tablet (10 mg total) by mouth daily.   90 tablet   3   . pantoprazole (PROTONIX) 40 MG tablet   Oral   Take 1 tablet (40 mg total) by mouth  daily.   90 tablet   3   . Probiotic Product ([place])   Oral   Take by mouth daily.           . ramipril (ALTACE) 10 MG capsule   Oral   Take 1 capsule (10 mg total) by mouth daily. Generic preferred   90 capsule   3   . Rivaroxaban (XARELTO) 15 MG TABS tablet   Oral   Take 1 tablet (15 mg total) by mouth daily.   90 tablet   3   . tamsulosin (FLOMAX) 0.4 MG CAPS capsule   Oral   Take 0.4 mg by mouth every other day. Take one tablet every other day          BP 142/61  Pulse 72  Temp(Src) 97.4 F (36.3 C) (Oral)  Resp 18  Ht 5\' 6"  (1.676 m)  Wt 195 lb (88.451 kg)  BMI 31.49 kg/m2  SpO2 100% Physical Exam  Nursing note and vitals reviewed. Constitutional: He is oriented to person, place, and time. He appears well-developed and well-nourished. No distress.  HENT:  Head: Normocephalic and atraumatic.  Nose: Nose normal.  Mouth/Throat: Oropharynx is clear and moist.  Eyes: Conjunctivae and EOM are normal. Pupils are equal, round, and reactive to light.  Neck: Normal range of motion. Neck supple. No JVD present. No tracheal deviation present. No thyromegaly present.  Cardiovascular: Normal rate, regular rhythm, normal heart sounds and intact distal pulses.  Exam reveals no gallop and no friction rub.   No murmur heard. Pulmonary/Chest: Effort normal and breath sounds normal. No stridor. No respiratory distress. He has no wheezes. He has no rales. He exhibits no tenderness.  Abdominal: Soft. Bowel sounds are normal. He exhibits no distension and no mass. There is no tenderness. There is no rebound and no guarding.  Musculoskeletal: Normal range of motion. He exhibits no edema and no tenderness.  Lymphadenopathy:    He has no cervical adenopathy.  Neurological: He is alert and oriented to person, place, and time. He exhibits normal muscle tone. Coordination normal.  Skin: Skin is warm and dry. No rash noted. No erythema. No pallor.  Psychiatric: He  has a normal mood and affect. His behavior is normal. Judgment and thought content normal.    ED Course  Procedures (including critical care time) Labs Review Labs Reviewed  URINALYSIS, ROUTINE W REFLEX MICROSCOPIC - Abnormal; Notable for the following:    Color, Urine RED (*)    APPearance CLOUDY (*)    Hgb  urine dipstick LARGE (*)    Protein, ur 30 (*)    Leukocytes, UA TRACE (*)    All other components within normal limits  CBC - Abnormal; Notable for the following:    RBC 4.03 (*)    Hemoglobin 12.7 (*)    HCT 37.0 (*)    All other components within normal limits  COMPREHENSIVE METABOLIC PANEL - Abnormal; Notable for the following:    Sodium 135 (*)    Glucose, Bld 117 (*)    Creatinine, Ser 1.45 (*)    GFR calc non Af Amer 41 (*)    GFR calc Af Amer 47 (*)    All other components within normal limits  URINE MICROSCOPIC-ADD ON   Imaging Review No results found.  EKG Interpretation   None       MDM   Final diagnoses:  Hematuria, gross    80 year old male with hematuria.  He has had a slight decrease in his CBC from earlier today.  Discussed with Dr. Risa Grill., on-call for urology.  He recommends patient to be followed sooner rather than later in the clinic.  Here recommends.  Patient called the office today to get worked in either Friday or Monday.  Patient has been instructed to followup at the [facility] long ER if he has complications of the weekend.    Kalman Drape, MD 12/30/13 0500

## 2013-12-30 NOTE — ED Notes (Signed)
Pt. Taken off blood thinners yesterday by pcp b/c of gross hematuria.  Pt. Started on keflex yesterday.

## 2014-01-09 ENCOUNTER — Ambulatory Visit (INDEPENDENT_AMBULATORY_CARE_PROVIDER_SITE_OTHER): Payer: Medicare Other | Admitting: *Deleted

## 2014-01-09 DIAGNOSIS — D518 Other vitamin B12 deficiency anemias: Secondary | ICD-10-CM

## 2014-01-09 MED ORDER — CYANOCOBALAMIN 1000 MCG/ML IJ SOLN
1000.0000 ug | Freq: Once | INTRAMUSCULAR | Status: AC
  Administered 2014-01-09: 1000 ug via INTRAMUSCULAR

## 2014-01-09 NOTE — Progress Notes (Signed)
   Pt in clinic for monthly Vitamin B12 injection.  Injection given in Rt deltoid, per pt request.  Derl Barrow, RN

## 2014-01-13 ENCOUNTER — Other Ambulatory Visit: Payer: Self-pay

## 2014-01-13 LAB — COLOGUARD: COLOGUARD: NEGATIVE

## 2014-01-16 ENCOUNTER — Telehealth: Payer: Self-pay | Admitting: Gastroenterology

## 2014-01-16 NOTE — Telephone Encounter (Signed)
Patient's daughter is looking for Cologuard results that should be returning to Dr. Sharlett Iles. Please, advise if they have returned.

## 2014-01-16 NOTE — Telephone Encounter (Signed)
I called [employer] and spoke with [place] and lab results where negative. Fax will be sent in the morning per [facility]. [name] patient's care provider was notified of results.

## 2014-01-23 ENCOUNTER — Ambulatory Visit (INDEPENDENT_AMBULATORY_CARE_PROVIDER_SITE_OTHER): Payer: Medicare Other | Admitting: *Deleted

## 2014-01-23 DIAGNOSIS — I495 Sick sinus syndrome: Secondary | ICD-10-CM

## 2014-01-23 DIAGNOSIS — Z950 Presence of cardiac pacemaker: Secondary | ICD-10-CM

## 2014-01-23 LAB — MDC_IDC_ENUM_SESS_TYPE_REMOTE
Battery Remaining Longevity: 7 mo
Battery Voltage: 2.64 V
Brady Statistic AP VP Percent: 99 %
Brady Statistic AS VP Percent: 1 %
Date Time Interrogation Session: 20150309132304
Lead Channel Impedance Value: 578 Ohm
Lead Channel Setting Pacing Amplitude: 2.5 V
Lead Channel Setting Sensing Sensitivity: 5.6 mV
MDC IDC MSMT BATTERY IMPEDANCE: 4191 Ohm
MDC IDC MSMT LEADCHNL RA IMPEDANCE VALUE: 528 Ohm
MDC IDC MSMT LEADCHNL RA PACING THRESHOLD AMPLITUDE: 0.375 V
MDC IDC MSMT LEADCHNL RA PACING THRESHOLD PULSEWIDTH: 0.4 ms
MDC IDC MSMT LEADCHNL RV PACING THRESHOLD AMPLITUDE: 0.75 V
MDC IDC MSMT LEADCHNL RV PACING THRESHOLD PULSEWIDTH: 0.4 ms
MDC IDC SET LEADCHNL RA PACING AMPLITUDE: 1.5 V
MDC IDC SET LEADCHNL RV PACING PULSEWIDTH: 0.4 ms
MDC IDC STAT BRADY AP VS PERCENT: 0 %
MDC IDC STAT BRADY AS VS PERCENT: 0 %

## 2014-01-23 LAB — PACEMAKER DEVICE OBSERVATION

## 2014-01-25 ENCOUNTER — Ambulatory Visit (INDEPENDENT_AMBULATORY_CARE_PROVIDER_SITE_OTHER): Payer: Medicare Other | Admitting: Family Medicine

## 2014-01-25 ENCOUNTER — Encounter: Payer: Self-pay | Admitting: Family Medicine

## 2014-01-25 VITALS — BP 153/79 | HR 98 | Temp 98.3°F | Wt 189.7 lb

## 2014-01-25 DIAGNOSIS — I10 Essential (primary) hypertension: Secondary | ICD-10-CM

## 2014-01-25 DIAGNOSIS — K222 Esophageal obstruction: Secondary | ICD-10-CM

## 2014-01-25 DIAGNOSIS — N138 Other obstructive and reflux uropathy: Secondary | ICD-10-CM

## 2014-01-25 DIAGNOSIS — I495 Sick sinus syndrome: Secondary | ICD-10-CM

## 2014-01-25 DIAGNOSIS — Z7901 Long term (current) use of anticoagulants: Secondary | ICD-10-CM

## 2014-01-25 DIAGNOSIS — N401 Enlarged prostate with lower urinary tract symptoms: Secondary | ICD-10-CM

## 2014-01-25 MED ORDER — RIVAROXABAN 15 MG PO TABS: 15.0000 mg | ORAL_TABLET | Freq: Every day | ORAL | Status: DC

## 2014-01-25 MED ORDER — RAMIPRIL 10 MG PO CAPS: 10.0000 mg | ORAL_CAPSULE | Freq: Every day | ORAL | Status: DC

## 2014-01-25 MED ORDER — NEBIVOLOL HCL 10 MG PO TABS: 10.0000 mg | ORAL_TABLET | Freq: Every day | ORAL | Status: DC

## 2014-01-26 ENCOUNTER — Encounter: Payer: Self-pay | Admitting: Family Medicine

## 2014-01-26 ENCOUNTER — Encounter: Payer: Self-pay | Admitting: Gastroenterology

## 2014-01-26 ENCOUNTER — Other Ambulatory Visit: Payer: Self-pay | Admitting: Family Medicine

## 2014-01-26 NOTE — Assessment & Plan Note (Signed)
Recently seen by urology. We discussed. I would prefer any questions about the medication back to their office. Hours and to give them a call was afternoon regarding whether or not he should start the mybetriq back.

## 2014-01-26 NOTE — Assessment & Plan Note (Signed)
Seems to be doing well. Occasionally still has some mild dizziness but mostly postural and he compensates for this well. I will refill his medicines make no medication changes.

## 2014-01-26 NOTE — Assessment & Plan Note (Signed)
Continues xarelto He seems to be doing quite well with this. No problems. Refilled.

## 2014-01-26 NOTE — Assessment & Plan Note (Signed)
His gastroenterologist has retired and he's not sure who he has been assigned to. He says he still occasionally has problems with swallowing but does not think he needs to be seen again.

## 2014-01-26 NOTE — Progress Notes (Signed)
   Subjective:    Patient ID: [name], male    DOB: 12/07/1919, 80 y.o.   MRN: [id number]  HPI Followup chronic issues. He has been seen by the urologist who has done a bladder scan. The urologist started him on a new medicine which really seem to help for about 2 weeks been he started having symptoms of being unable to empty his bladder so he stopped that. He has questions about whether he should restart it or not. #2. Needs some refills. I having a problems with his medicines. #3. Atrial fibrillation also relative. He needs some refills on those a relative. He's otherwise doing well with this, likes not having to have his blood drawn all the time. No chest pain, no shortness of breath. #4. Generalized malaise and fatigue. He thinks this is probably related to age. Says he can get his ADLs done and get things done and he had to be has to but many days she does not have the energy that he would like to have. He denies any specific depressive symptoms.   Review of Systems  Respiratory: Negative for chest tightness.   Neurological: Positive for weakness.  Psychiatric/Behavioral: Negative for suicidal ideas, hallucinations, confusion, sleep disturbance, self-injury and dysphoric mood.       Objective:   Physical Exam  Constitutional: He appears well-developed and well-nourished.  Neck: Normal range of motion. Neck supple. No JVD present. No thyromegaly present.  Cardiovascular: Normal rate and intact distal pulses.   Pulmonary/Chest: Effort normal and breath sounds normal. He has no wheezes. He has no rales.  Lymphadenopathy:    He has no cervical adenopathy.  Psychiatric: He has a normal mood and affect. His behavior is normal. Judgment and thought content normal.          Assessment & Plan:  I'll see him back in 2-3 months, certainly sooner with problems.

## 2014-01-26 NOTE — Assessment & Plan Note (Signed)
He continues to be followed by cardiology for this. He recently had his checkup with them.

## 2014-02-17 ENCOUNTER — Encounter: Payer: Self-pay | Admitting: *Deleted

## 2014-03-02 ENCOUNTER — Ambulatory Visit (HOSPITAL_COMMUNITY)
Admission: RE | Admit: 2014-03-02 | Discharge: 2014-03-02 | Disposition: A | Discharge status: Home / Self Care | Payer: Medicare Other | Source: Ambulatory Visit | Attending: Family Medicine | Admitting: Family Medicine

## 2014-03-02 ENCOUNTER — Encounter: Payer: Self-pay | Admitting: Family Medicine

## 2014-03-02 ENCOUNTER — Telehealth: Payer: Self-pay | Admitting: Family Medicine

## 2014-03-02 ENCOUNTER — Ambulatory Visit (INDEPENDENT_AMBULATORY_CARE_PROVIDER_SITE_OTHER): Payer: Medicare Other | Admitting: Family Medicine

## 2014-03-02 VITALS — BP 100/58 | HR 64 | Temp 97.7°F | Ht 66.0 in

## 2014-03-02 DIAGNOSIS — I959 Hypotension, unspecified: Secondary | ICD-10-CM | POA: Insufficient documentation

## 2014-03-02 LAB — CBC
HCT: 41.9 % (ref 39.0–52.0)
HEMOGLOBIN: 14.1 g/dL (ref 13.0–17.0)
MCH: 31.1 pg (ref 26.0–34.0)
MCHC: 33.7 g/dL (ref 30.0–36.0)
MCV: 92.3 fL (ref 78.0–100.0)
Platelets: 201 10*3/uL (ref 150–400)
RBC: 4.54 MIL/uL (ref 4.22–5.81)
RDW: 13.8 % (ref 11.5–15.5)
WBC: 8.7 10*3/uL (ref 4.0–10.5)

## 2014-03-02 LAB — COMPREHENSIVE METABOLIC PANEL
ALK PHOS: 63 U/L (ref 39–117)
ALT: 12 U/L (ref 0–53)
AST: 14 U/L (ref 0–37)
Albumin: 3.9 g/dL (ref 3.5–5.2)
BUN: 29 mg/dL — ABNORMAL HIGH (ref 6–23)
CO2: 23 mEq/L (ref 19–32)
CREATININE: 1.7 mg/dL — AB (ref 0.50–1.35)
Calcium: 9.3 mg/dL (ref 8.4–10.5)
Chloride: 95 mEq/L — ABNORMAL LOW (ref 96–112)
Glucose, Bld: 204 mg/dL — ABNORMAL HIGH (ref 70–99)
Potassium: 6 mEq/L — ABNORMAL HIGH (ref 3.5–5.3)
Sodium: 134 mEq/L — ABNORMAL LOW (ref 135–145)
Total Bilirubin: 0.4 mg/dL (ref 0.3–1.2)
Total Protein: 6.8 g/dL (ref 6.0–8.3)

## 2014-03-02 MED ORDER — NEBIVOLOL HCL 5 MG PO TABS: 5.0000 mg | ORAL_TABLET | Freq: Every day | ORAL | Status: DC

## 2014-03-02 NOTE — Progress Notes (Addendum)
Patient ID: ABNER ARDIS    DOB: 1920-05-03, 80 y.o.   MRN: [id number] --- Subjective:  Axell is a 80 y.o.male with h/o sick sinus syndrome with pacemaker, a-fib on xarelto, HTN, BPH who presents with his daughter in law for evaluation of dizziness and low blood pressure.  He reports feeling light headed and weak when getting out of the car and walking to his urologist's office this am. BP at his urologist's office was 88/57 at which point he was instructed to be seen in the clinic for this.  He went to eat lunch and felt unsteady on his legs and light headed both before and after eating. Episodes occurred with change in position from sitting to standing. No loss of consciousness, no change in vision, no headache, no chest pain, shortness of breath or palpitations associated with the events. No fall or trauma to the head. He took his BP medications this morning which included nebivolol 10mg , ramipril 10mg . He also takes multaq twice a day and took flomax last night (which he takes every other night).  He drank 12oz of tea at lunch. He has had a poorer appetite in the last 2 weeks. Denies any vomiting or diarrhea.   He lives at home alone and tends to his ADL's. His son and daughter and law live close by.   ROS: see HPI Past Medical History: reviewed and updated medications and allergies. Social History: Tobacco: former smoker  Objective: Filed Vitals:   03/02/14 1442  BP: 100/58  Pulse: 64  Temp: 97.7 F (36.5 C)    Physical Examination:   General appearance - alert, elderly and very pleasant gentleman, in no acute distress Chest - clear to auscultation, no wheezes, rales or rhonchi, symmetric air entry Heart - normal rate, regular rhythm, normal S1, S2, no murmurs Extremities - +1 pitting pedal edema Neuro - CN2-12 grossly intact, eliptical shaped pupil on right, chronic,  normal grip strength bilaterally, stood and walked steadily with cane.

## 2014-03-02 NOTE — Patient Instructions (Signed)
For the light headedness and weakness, I think it is related to your low blood pressure.  We are going to stop the following medicines for now: ramipril and flomax which cause low blood pressure.  We are also going to half the nebivolol to 5mg .   Please follow up tomorrow to see how you are doing. It is very important for someone to spend the night with you tonight to make sure that you do not fall.

## 2014-03-02 NOTE — Assessment & Plan Note (Signed)
Light headedness and feeling of weakness likely secondary to hypotension given documentary BP of 88/57 earlier today.  BP in office here is improved but still lower than it has been in the past in the office.  Patient is well appearing, feeling better and walking steadily. Doesn't appear to need to be hospitalized.  - EKG reviewed today and unchanged from previous, ventricular paced, rate of 65, regular.  - will stop ramipril 10mg  daily and flomax. Decreae nebivolol to 5mg . Continue multaq 400mg  bid. Continue xarelto since no trauma.  - will obtain CMP to monitor for kidney function, liver function in setting of hypotension and CBC to monitor for anemia in setting of xarelto.  - encouraged continued and aggressive hydration.  - follow up tomorrow.  - his daughter in law plans on staying with him overnight to monitor him in case of future episodes.   Reviewed case with Dr. Andria Frames who agreed with plan.

## 2014-03-02 NOTE — Telephone Encounter (Signed)
Received labwork from today including K of 6 and Cr of 1.7 from 1.4. No mention of hemolysis on lab result.  Will repeat labwork tomorrow to monitor K, in case there was a component of hemolysis. Ramipril stopped.  Acute on chronic renal failure likely from hypotension and renal hypoperfusion. BP meds stopped or decreased.  Called patient and let him know of the results. Instructed him to drink plenty of fluids. He tells me he is not on a K supplement.  Patient to follow up tomorrow for close monitoring.   [name], PGY-3 Family Medicine Resident

## 2014-03-03 ENCOUNTER — Encounter: Payer: Self-pay | Admitting: Family Medicine

## 2014-03-03 ENCOUNTER — Encounter: Payer: Self-pay | Admitting: Cardiovascular Disease

## 2014-03-03 ENCOUNTER — Ambulatory Visit (INDEPENDENT_AMBULATORY_CARE_PROVIDER_SITE_OTHER): Payer: Medicare Other | Admitting: Family Medicine

## 2014-03-03 VITALS — BP 117/71 | HR 66 | Ht 66.0 in | Wt 193.3 lb

## 2014-03-03 DIAGNOSIS — E875 Hyperkalemia: Secondary | ICD-10-CM | POA: Insufficient documentation

## 2014-03-03 DIAGNOSIS — I959 Hypotension, unspecified: Secondary | ICD-10-CM

## 2014-03-03 DIAGNOSIS — D518 Other vitamin B12 deficiency anemias: Secondary | ICD-10-CM

## 2014-03-03 LAB — BASIC METABOLIC PANEL
BUN: 35 mg/dL — AB (ref 6–23)
CO2: 22 meq/L (ref 19–32)
Calcium: 8.8 mg/dL (ref 8.4–10.5)
Chloride: 97 mEq/L (ref 96–112)
Creat: 1.94 mg/dL — ABNORMAL HIGH (ref 0.50–1.35)
GLUCOSE: 96 mg/dL (ref 70–99)
POTASSIUM: 5.8 meq/L — AB (ref 3.5–5.3)
Sodium: 129 mEq/L — ABNORMAL LOW (ref 135–145)

## 2014-03-03 MED ORDER — NEBIVOLOL HCL 5 MG PO TABS: 5.0000 mg | ORAL_TABLET | Freq: Every day | ORAL | Status: DC

## 2014-03-03 MED ORDER — CYANOCOBALAMIN 1000 MCG/ML IJ SOLN
1000.0000 ug | Freq: Once | INTRAMUSCULAR | Status: AC
  Administered 2014-03-03: 1000 ug via INTRAMUSCULAR

## 2014-03-03 MED ORDER — SODIUM POLYSTYRENE SULFONATE 15 GM/60ML PO SUSP: 15.0000 g | Freq: Every day | ORAL | Status: DC

## 2014-03-03 NOTE — Assessment & Plan Note (Signed)
Patient with hypotension yesterday, with symptoms of dizziness.  Since holding his ACEI and attempting to increase his oral intake, his BP has improved and he is not orthostatic on exam today.  His dizziness has improved this morning as well.  Discussed options to 1) continue with oral rehydration and reduction in BP meds at home; gave option for ED for IVF or direct-admit for IVF and monitoring if he does not feel well enough to continue at home.  Patient and daughter-in-law agree with plan to increase hydration at home; having repeat BMet done today in light of increased Cr and K+ (6.0) on yesterday's labs. To send in reduced dose Bystolic to local pharmacy at [facility].  To follow up in this office at the beginning of next week with primary physician for decision on medication titration.

## 2014-03-03 NOTE — Patient Instructions (Addendum)
It was a pleasure to see you today.  Your blood pressures in the office for me are: 118/60 (sitting) and 104/52 (standing).    I ask that you continue to Gamaliel RAMIPRIL until your next visit early next week.  I sent a new prescription for BYSTOLIC 5mg  ONE DAILY to [facility] [address], [place]. Resume the Flomax 0.4mg  as you have been taking it (every other day).  New Medication: Kayexalate 67mL one time daily through the weekend (Fri/Sat/Sun).  We are checking your potassium today; I will call [name] (home [phone]; cell [phone]) if changes need to be made to the medicine.  Follow up Mon or Tues of next week.

## 2014-03-03 NOTE — Assessment & Plan Note (Signed)
Elevated K+ (6.0) and elevated Cr (1.7). Suspect pre-renal in light of hypotension.  Low-dose kayexalate and recheck of BMet today.  Patient's daughter-in-law Janean Sark (home [phone]; cell [phone]).  Follow up beginning of next week in [facility]. Continuing to hold ACEI.

## 2014-03-03 NOTE — Progress Notes (Signed)
   Subjective:    Patient ID: [name], male    DOB: 02-12-1920, 80 y.o.   MRN: [id number]  HPI Patient here for follow up on hypotension that was evaluated in this office yesterday; he was discharged to home under the care of his daughter-in-law Janean Sark, with cessation of his ACEI and reduction in dose of his BB, told to follow up today.  This morning he appeared to be doing much better, walking at home well, and eating well.  Became dizzy when he got out of the car this morning in our parking lot, resolved when he sat down. Denies chest pain or pressure, denies dyspnea except for mild shortness of breath with dizziness.  No diaphoresis, no presyncope.  No falls or syncope.  Has a pacemaker.   At home he reports eating relatively well; drank a couple extra glasses of water since yesterday's visit. No diarrhea, no N/V.  Reports decreased urinary output overnight.  Takes his tamsulosin 0.4mg  once every-other-day, seen by urologist yesterday and had a post-void bladder scan which did not reveal urinary retention per [name]'s report.   Was unable to get the reduced dose of Nebivolol because the prescription was sent to mail-order pharmacy.   Review of Systems See HPI    Objective:   Physical Exam Well appearing, no apparent distress.  In wheelchair, but able to get up and ambulate with cane, no assistance.   Sitting BP: 118/60; standing BP 104/52.  No variation in pulse.  HEENT Neck supple, moist mucus membanes PULM Clear bilaterally, no rales or wheezes COR: Regular S1S2 EXTS Brisk cap refill.        Assessment & Plan:

## 2014-03-04 ENCOUNTER — Telehealth: Payer: Self-pay | Admitting: Family Medicine

## 2014-03-04 NOTE — Telephone Encounter (Signed)
Called [name] at her home number (See OV yesterday), discussed lab results including marginally improved K+ and worsening renal function/hyponatremia.  She says he had a much better afternoon/evening yesterday and was able to take in more fluids than he had previously.  Will continue with Kayexalate as discussed in visit yesterday; he has appt to return to [facility] on Monday morning.  Recommend repeat BMet, also possibly Serum and Urine Osm, Urine Na+.  Not on thiazides; suspect hypovolemic hyponatremia.   Instructions to [name] to bring to ED if clinical worsening over the weekend.  Dalbert Mayotte, MD

## 2014-03-06 ENCOUNTER — Ambulatory Visit (INDEPENDENT_AMBULATORY_CARE_PROVIDER_SITE_OTHER): Payer: Medicare Other | Admitting: Sports Medicine

## 2014-03-06 ENCOUNTER — Encounter: Payer: Self-pay | Admitting: Sports Medicine

## 2014-03-06 VITALS — BP 114/48 | HR 65 | Temp 98.2°F | Ht 66.0 in | Wt 192.0 lb

## 2014-03-06 DIAGNOSIS — E8779 Other fluid overload: Secondary | ICD-10-CM

## 2014-03-06 DIAGNOSIS — I10 Essential (primary) hypertension: Secondary | ICD-10-CM

## 2014-03-06 DIAGNOSIS — N401 Enlarged prostate with lower urinary tract symptoms: Secondary | ICD-10-CM

## 2014-03-06 DIAGNOSIS — E875 Hyperkalemia: Secondary | ICD-10-CM

## 2014-03-06 DIAGNOSIS — I959 Hypotension, unspecified: Secondary | ICD-10-CM

## 2014-03-06 DIAGNOSIS — N183 Chronic kidney disease, stage 3 unspecified: Secondary | ICD-10-CM

## 2014-03-06 DIAGNOSIS — E8770 Fluid overload, unspecified: Secondary | ICD-10-CM

## 2014-03-06 DIAGNOSIS — N138 Other obstructive and reflux uropathy: Secondary | ICD-10-CM

## 2014-03-06 LAB — COMPREHENSIVE METABOLIC PANEL
ALBUMIN: 3.9 g/dL (ref 3.5–5.2)
ALT: 31 U/L (ref 0–53)
AST: 18 U/L (ref 0–37)
Alkaline Phosphatase: 45 U/L (ref 39–117)
BUN: 33 mg/dL — AB (ref 6–23)
CO2: 23 meq/L (ref 19–32)
Calcium: 8.7 mg/dL (ref 8.4–10.5)
Chloride: 100 mEq/L (ref 96–112)
Creat: 1.64 mg/dL — ABNORMAL HIGH (ref 0.50–1.35)
GLUCOSE: 108 mg/dL — AB (ref 70–99)
POTASSIUM: 5.6 meq/L — AB (ref 3.5–5.3)
SODIUM: 131 meq/L — AB (ref 135–145)
Total Bilirubin: 0.6 mg/dL (ref 0.2–1.2)
Total Protein: 6 g/dL (ref 6.0–8.3)

## 2014-03-06 LAB — CBC
HCT: 37.6 % — ABNORMAL LOW (ref 39.0–52.0)
Hemoglobin: 12.9 g/dL — ABNORMAL LOW (ref 13.0–17.0)
MCH: 30.7 pg (ref 26.0–34.0)
MCHC: 34.3 g/dL (ref 30.0–36.0)
MCV: 89.5 fL (ref 78.0–100.0)
Platelets: 163 10*3/uL (ref 150–400)
RBC: 4.2 MIL/uL — AB (ref 4.22–5.81)
RDW: 14.2 % (ref 11.5–15.5)
WBC: 8 10*3/uL (ref 4.0–10.5)

## 2014-03-06 LAB — TROPONIN I

## 2014-03-06 MED ORDER — SILODOSIN 4 MG PO CAPS: ORAL_CAPSULE | ORAL | Status: DC

## 2014-03-06 NOTE — Patient Instructions (Signed)
We are checking labs again today. Please stop your Flomax.  I am giving a new medicine called rapid flow that want you to keep taking every other day. Please hold your bystolic until you are seen on Wednesday   Please followup with Dr. Nori Riis or other provider on Wednesday  Orthostatic Hypotension Orthostatic hypotension is a sudden fall in blood pressure. It occurs when a person goes from a sitting or lying position to a standing position. CAUSES   Loss of body fluids (dehydration).  Medicines that lower blood pressure.  Sudden changes in posture, such as sudden standing when you have been sitting or lying down.  Taking too much of your medicine. SYMPTOMS   Lightheadedness or dizziness.  Fainting or near-fainting.  A fast heart rate (tachycardia).  Weakness.  Feeling tired (fatigue). DIAGNOSIS  Your caregiver may find the cause of orthostatic hypotension through:  A history and/or physical exam.  Checking your blood pressure. Your caregiver will check your blood pressure when you are:  Lying down.  Sitting.  Standing.  Tilt table testing. In this test, you are placed on a table that goes from a lying position to a standing position. You will be strapped to the table. This test helps to monitor your blood pressure and heart rate when you are in different positions. TREATMENT   If orthostatic hypotension is caused by your medicines, your caregiver will need to adjust your dosage. Do not stop or adjust your medicine on your own.  When changing positions, make these changes slowly. This allows your body to adjust to the different position.  Compression stockings that are worn on your lower legs may be helpful.  Your caregiver may have you consume extra salt. Do not add extra salt to your diet unless directed by your caregiver.  Eat frequent, small meals. Avoid sudden standing after eating.  Avoid hot showers or excessive heat.  Your caregiver may give you fluids  through the vein (intravenous).  Your caregiver may put you on medicine to help enhance fluid retention. SEEK IMMEDIATE MEDICAL CARE IF:   You faint or have a near-fainting episode. Call your local emergency services (911 in [place]  You have or develop chest pain.  You feel sick to your stomach (nauseous) or vomit.  You have a loss of feeling or movement in your arms or legs.  You have difficulty talking, slurred speech, or you are unable to talk.  You have difficulty thinking or have confused thinking. MAKE SURE YOU:   Understand these instructions.  Will watch your condition.  Will get help right away if you are not doing well or get worse. Document Released: 10/24/2002 Document Revised: 01/26/2012 Document Reviewed: 02/16/2009 [facility] Patient Information 2014 [place], [place].

## 2014-03-06 NOTE — Progress Notes (Signed)
[name] - 80 y.o. male MRN [id number]  Date of birth: Aug 17, 1920  CC & SUBJECTIVE:      Chief Complaint  Patient presents with  . Follow-up    low BP readings, hyperkalemia  Patient seen last week for orthostatic symptoms.  Somewhat improved today  but reported significant fatigue over the weekend.  Patient does report some intermittent episodes of midsternal chest pressure that is nonradiating.  No exertional component.  Resolved spontaneously after 1-2 hours.  No known prior coronary artery disease or CHF; no sick sinus syndrome status post pacemaker on multaq & bistolic (weaned last week due to orthostatic symptoms).    Compliant with Kayexalate but reporting normal bowel movements.  Has stopped ACE inhibitor.    Pt denies any fevers, chills, or rigors.  Significant dyspnea on exertion with markedly decreased functional status over the past one week.  Improved from last visit.    Significant urinary symptoms.  Has continued to take Flomax every other day.    See problem based charting for additional subjective (including HPI, Interval History & ROS)   HISTORY: Wt Readings from Last 3 Encounters:  03/06/14 192 lb (87.091 kg)  03/03/14 193 lb 4.8 oz (87.68 kg)  01/25/14 189 lb 11.2 oz (86.047 kg)   BP Readings from Last 3 Encounters:  03/06/14 114/48  03/03/14 117/71  03/02/14 100/58    History  Smoking status  . Former Smoker  . Quit date: 11/17/1969  Smokeless tobacco  . Never Used   Health Maintenance Due  Topic  . Tetanus/tdap     Otherwise past Medical, Surgical, Social, and Family History Reviewed per EMR Medications and Allergies reviewed and updated per below.  VITALS: BP 114/48  Pulse 65  Temp(Src) 98.2 F (36.8 C) (Oral)  Ht 5\' 6"  (1.676 m)  Wt 192 lb (87.091 kg)  BMI 31.00 kg/m2  SpO2 100%  PHYSICAL EXAM: GENERAL:  elderly Caucasian male.  Pale, In no discomfort; no respiratory distress  PSYCH: alert and appropriate, good insight   HNEENT:   no JVD at rest, positive hepatojugular reflux.    CARDIO:  regular rate and rhythm at ~65, S1-S2 heard, no S3, systolic ejection murmur   LUNGS:  mild basilar crackles   ABDOMEN:  soft, nontender   EXTREM:  Warm, well perfused.  Moves all 4 extremities spontaneously; no lateralization.  Pedal pulses 1/4.  3+/4 pretibial edema.  GU:   SKIN:     MEDICATIONS, LABS & OTHER ORDERS: Previous Medications   BETA CAROTENE W/MINERALS (OCUVITE) TABLET    Take 1 tablet by mouth daily.   DRONEDARONE (MULTAQ) 400 MG TABLET    Take 1 tablet (400 mg total) by mouth 2 (two) times daily with a meal.   NEBIVOLOL (BYSTOLIC) 10 MG TABLET    Take 1 tablet (10 mg total) by mouth daily.   NEBIVOLOL (BYSTOLIC) 5 MG TABLET    Take 1 tablet (5 mg total) by mouth daily.   PANTOPRAZOLE (PROTONIX) 40 MG TABLET    Take 1 tablet (40 mg total) by mouth daily.   PROBIOTIC PRODUCT (PHILLIPS COLON HEALTH PO)    Take by mouth daily.     RAMIPRIL (ALTACE) 10 MG CAPSULE    Take 1 capsule (10 mg total) by mouth daily. Generic preferred   RIVAROXABAN (XARELTO) 15 MG TABS TABLET    Take 1 tablet (15 mg total) by mouth daily.   SODIUM POLYSTYRENE (KAYEXALATE) 15 GM/60ML SUSPENSION    Take 60 mLs (15  g total) by mouth daily.   Modified Medications   No medications on file   New Prescriptions   SILODOSIN (RAPAFLO) 4 MG CAPS CAPSULE    Take one capsule every other day at night starting Wednesday   Discontinued Medications   RIVAROXABAN (XARELTO) 15 MG TABS TABLET    Take 1 tablet (15 mg total) by mouth daily.   TAMSULOSIN (FLOMAX) 0.4 MG CAPS CAPSULE    Take 0.4 mg by mouth every other day. Take one tablet every other day   Orders Placed This Encounter  Procedures  . Comprehensive metabolic panel  . CBC  . Troponin I  . Pro b natriuretic peptide (BNP)   ASSESSMENT & PLAN: See problem based charting & AVS for pt instructions.

## 2014-03-07 DIAGNOSIS — E8770 Fluid overload, unspecified: Secondary | ICD-10-CM | POA: Insufficient documentation

## 2014-03-07 LAB — PRO B NATRIURETIC PEPTIDE: Pro B Natriuretic peptide (BNP): 9021 pg/mL — ABNORMAL HIGH (ref <451)

## 2014-03-07 NOTE — Assessment & Plan Note (Signed)
Acute condition 1. Recheck BMET 2. Continue to hold lisinopril and likely continue Kayexalate > If persistently not improved and no other etiology would then consider further hyperkalemic workup including urine osms  .

## 2014-03-07 NOTE — Assessment & Plan Note (Signed)
Acute condition 1. Given evidence of findings on pro BNP worrisome for congestive heart failure.  Given the acuity will plan to titrate off Bystolic, may need to consider discontinuation of multaq given risk factor of developing CHF. 2. Called and discussed findings with daughter-in-law who will schedule a followup appointment with cardiology ASAP.  Likely needs a echo and potential ischemic evaluation but given no hypoxia and overall clinical stability outpatient workup warranted.  Given orthostasis diuresis likely inappropriate.  Will need to consider if the hyperkalemia and AKI. are secondary to cardiorenal syndrome > Followup with Dr. Nori Riis on 4-22 and with cardiology ASAP. Patient to call back if any further issues or difficulty scheduling appointment with cardiology.  Marland Kitchen

## 2014-03-07 NOTE — Assessment & Plan Note (Signed)
Acute, somewhat improved condition - Orthostatic vital signs noted again today with persistent orthostasis - Exact unclear etiology but likely given low blood pressure and orthostatic effects and with evidence of constant pacing on EKG reviewed from last visit and pulse today concerned for adverse reaction to both Bystolic and alpha blockade with Flomax. 1. Decrease Bystolic to 5 mg every other day 2. Discontinue Flomax and start Rapaflo for hopeful decreased alpha effect while still maintaing UOP 3. Check troponin and proBNP.  No indication for hospitalization has been going on greater than one week but would be worrisome if troponin positive.  > Ensure patient has been able to fill rapaflo  .

## 2014-03-08 ENCOUNTER — Ambulatory Visit (INDEPENDENT_AMBULATORY_CARE_PROVIDER_SITE_OTHER): Payer: Medicare Other | Admitting: Family Medicine

## 2014-03-08 VITALS — BP 132/64 | HR 65 | Temp 97.9°F | Ht 66.0 in | Wt 196.3 lb

## 2014-03-08 DIAGNOSIS — N183 Chronic kidney disease, stage 3 unspecified: Secondary | ICD-10-CM

## 2014-03-08 DIAGNOSIS — N401 Enlarged prostate with lower urinary tract symptoms: Secondary | ICD-10-CM

## 2014-03-08 DIAGNOSIS — I10 Essential (primary) hypertension: Secondary | ICD-10-CM

## 2014-03-08 DIAGNOSIS — N138 Other obstructive and reflux uropathy: Secondary | ICD-10-CM

## 2014-03-08 MED ORDER — RAMIPRIL 2.5 MG PO CAPS: 2.5000 mg | ORAL_CAPSULE | Freq: Every day | ORAL | Status: DC

## 2014-03-08 MED ORDER — SILODOSIN 4 MG PO CAPS: ORAL_CAPSULE | ORAL | Status: DC

## 2014-03-08 MED ORDER — FUROSEMIDE 20 MG PO TABS: 20.0000 mg | ORAL_TABLET | Freq: Every day | ORAL | Status: DC

## 2014-03-08 MED ORDER — NEBIVOLOL HCL 2.5 MG PO TABS: 2.5000 mg | ORAL_TABLET | Freq: Every day | ORAL | Status: DC

## 2014-03-08 NOTE — Patient Instructions (Addendum)
I am decreasing your bystolic to a dose of 2.5 mg a day. This is a decrease from before. I would like to keep you on this medicine if possible as I think it offers  some cardiovascular protection. I am also decreasing your ramapril to 2.5 mg a day. Previously you  had been on 10 and your potassium was running high. We are making appointment for you to see the cardiologist. Please take this with you so he knows the changes we have made. I am also going to call in a low dose of the medicine called furosemide. I want you to take one tablet a day until you see the cardiologist.   I am also changing your medicine for urinary retention to one that has less cardiac side effects so I have given new prescription for that as well. It will replace the Flomax.  Stop Flomax, start. Rapaflo in its place. Once daily. The new dose of bystolic is 2.5 mg once daily The new dose of ramapril is 2.5 mg once daily The new medicine is furosemide. Take one tablet ( 20 mg )  once a day for the next 7 days until you see the cardiologist. I will let him make the final decision about that medicine as in whether or not you need to stay on it.  Check her blood pressure and write it down daily. If you have continued symptoms in the next few days despite these changes or if you have any worsening symptoms, please, office immediately and tell them to get the message to me as soon as possible.  Regarding the medicine for your high potassium, I would have you take one dose today, and one dose tomorrow and then stop that.

## 2014-03-09 ENCOUNTER — Encounter: Payer: Self-pay | Admitting: Family Medicine

## 2014-03-09 NOTE — Assessment & Plan Note (Signed)
Will discontinue Flomax and started him on Rappa flow as it has less cardiac side effects.

## 2014-03-09 NOTE — Assessment & Plan Note (Signed)
I think we have his blood pressure a little too well controlled so we'll decrease his medications as per med list. Also wanted to followup with his cardiologist regarding his pacemaker and we had made an appointment for him today while he is here at our office.

## 2014-03-09 NOTE — Progress Notes (Signed)
   Subjective:    Patient ID: [name], male    DOB: 12-18-1919, 80 y.o.   MRN: [id number]  HPI Here with his daughter-in-law with complaint of increasing dizziness. Particularly when he goes from seated to standing position. He has noticed his pacemaker seems to be staying at about 39 where previously he thought it would occasionally go up. He was seen last few days and some medicines were stopped. Since stopping his Flomax he's had increased urinary symptoms of dribbling, difficulty starting stream, hesitancy. Also feels like he's gained some fluid weight. They had decreased his bystolic 25 mg and then tapered off it. They had also stopped his ACE inhibitor because his potassium was up. He feels fatigued and not quite at his baseline. No chest pain. No lower extremity edema. no orthopnea.   Review of Systems See history of present illness    Objective:   Physical Exam  Vital signs reviewed. GENERAL: Well-developed, well-nourished, no acute distress. CARDIOVASCULAR: Regular rate and rhythm no murmur gallop or rub LUNGS: Clear to auscultation bilaterally, no rales or wheeze. ABDOMEN: Soft positive bowel sounds. No ascites. Nontender. NEURO: No gross focal neurological deficits except decreased hearing MSK: Movement of extremity x 4.        Assessment & Plan:

## 2014-03-09 NOTE — Assessment & Plan Note (Signed)
He has gotten a little fluid overloaded. I would rather he be on a small dose of ACE inhibitor or afterload reduction. Since he is now a little fluid overloaded I will give him 7 days of Lasix. Have to be careful given his chronic renal insufficiency. I think this will also help with his hyperkalemia. He will followup with me in 2 weeks or so. He has an appointment next week with his cardiologist. He will follow up in the interim if any new or worsening symptoms.

## 2014-03-14 ENCOUNTER — Encounter (HOSPITAL_COMMUNITY)
Admission: AD | Disposition: A | Discharge status: Home / Self Care | Payer: Self-pay | Source: Ambulatory Visit | Attending: Cardiovascular Disease

## 2014-03-14 ENCOUNTER — Ambulatory Visit (INDEPENDENT_AMBULATORY_CARE_PROVIDER_SITE_OTHER): Payer: Medicare Other | Admitting: Cardiology

## 2014-03-14 ENCOUNTER — Other Ambulatory Visit: Payer: Self-pay | Admitting: *Deleted

## 2014-03-14 ENCOUNTER — Ambulatory Visit (HOSPITAL_COMMUNITY)
Admission: AD | Admit: 2014-03-14 | Discharge: 2014-03-14 | Disposition: A | Discharge status: Home / Self Care | Payer: Medicare Other | Source: Ambulatory Visit | Attending: Cardiovascular Disease | Admitting: Cardiovascular Disease

## 2014-03-14 ENCOUNTER — Encounter: Payer: Self-pay | Admitting: Cardiology

## 2014-03-14 VITALS — BP 140/80 | HR 72 | Ht 66.0 in | Wt 194.9 lb

## 2014-03-14 DIAGNOSIS — I97190 Other postprocedural cardiac functional disturbances following cardiac surgery: Secondary | ICD-10-CM

## 2014-03-14 DIAGNOSIS — Z45018 Encounter for adjustment and management of other part of cardiac pacemaker: Secondary | ICD-10-CM

## 2014-03-14 DIAGNOSIS — Z950 Presence of cardiac pacemaker: Secondary | ICD-10-CM

## 2014-03-14 DIAGNOSIS — I495 Sick sinus syndrome: Secondary | ICD-10-CM | POA: Insufficient documentation

## 2014-03-14 DIAGNOSIS — Z7901 Long term (current) use of anticoagulants: Secondary | ICD-10-CM | POA: Insufficient documentation

## 2014-03-14 DIAGNOSIS — I4891 Unspecified atrial fibrillation: Secondary | ICD-10-CM | POA: Insufficient documentation

## 2014-03-14 DIAGNOSIS — Z45010 Encounter for checking and testing of cardiac pacemaker pulse generator [battery]: Secondary | ICD-10-CM

## 2014-03-14 DIAGNOSIS — Z87891 Personal history of nicotine dependence: Secondary | ICD-10-CM | POA: Insufficient documentation

## 2014-03-14 DIAGNOSIS — R001 Bradycardia, unspecified: Secondary | ICD-10-CM

## 2014-03-14 HISTORY — PX: PERMANENT PACEMAKER GENERATOR CHANGE: SHX6022

## 2014-03-14 LAB — SURGICAL PCR SCREEN
MRSA, PCR: NEGATIVE
Staphylococcus aureus: NEGATIVE

## 2014-03-14 SURGERY — PERMANENT PACEMAKER GENERATOR CHANGE
Anesthesia: LOCAL

## 2014-03-14 MED ORDER — SODIUM CHLORIDE 0.9 % IV SOLN: INTRAVENOUS | Status: DC

## 2014-03-14 MED ORDER — LIDOCAINE HCL (PF) 1 % IJ SOLN
INTRAMUSCULAR | Status: AC
  Filled 2014-03-14: qty 60

## 2014-03-14 MED ORDER — MUPIROCIN 2 % EX OINT
TOPICAL_OINTMENT | Freq: Two times a day (BID) | CUTANEOUS | Status: DC
  Administered 2014-03-14: 16:00:00 via NASAL

## 2014-03-14 MED ORDER — SODIUM CHLORIDE 0.9 % IJ SOLN: 3.0000 mL | INTRAMUSCULAR | Status: DC | PRN

## 2014-03-14 MED ORDER — ACETAMINOPHEN 325 MG PO TABS
325.0000 mg | ORAL_TABLET | ORAL | Status: DC | PRN
  Filled 2014-03-14: qty 2

## 2014-03-14 MED ORDER — MUPIROCIN 2 % EX OINT
TOPICAL_OINTMENT | CUTANEOUS | Status: AC
  Filled 2014-03-14: qty 22

## 2014-03-14 MED ORDER — ONDANSETRON HCL 4 MG/2ML IJ SOLN: 4.0000 mg | Freq: Four times a day (QID) | INTRAMUSCULAR | Status: DC | PRN

## 2014-03-14 MED ORDER — RIVAROXABAN 15 MG PO TABS: 15.0000 mg | ORAL_TABLET | Freq: Every day | ORAL | Status: DC

## 2014-03-14 MED ORDER — CHLORHEXIDINE GLUCONATE 4 % EX LIQD
60.0000 mL | Freq: Once | CUTANEOUS | Status: DC
  Filled 2014-03-14: qty 60

## 2014-03-14 MED ORDER — CEFAZOLIN SODIUM-DEXTROSE 2-3 GM-% IV SOLR
2.0000 g | INTRAVENOUS | Status: DC
  Filled 2014-03-14: qty 50

## 2014-03-14 MED ORDER — SODIUM CHLORIDE 0.9 % IR SOLN
80.0000 mg | Status: DC
  Filled 2014-03-14 (×2): qty 2

## 2014-03-14 NOTE — Patient Instructions (Signed)
Go to the hospital for admission , Do not eat or drink anything until told to do so by the medical staff

## 2014-03-14 NOTE — Op Note (Signed)
Procedure report  Procedure performed:  1. Dual chamber generator changeout   Reason for procedure:  1. Device generator at elective replacement interval  2. Symptomatic bradycardia due to high grade AV block and sinus node dysfunction 3. Pacemaker syndrome (VVI mode switch at [facility]) Procedure performed by:  Sanda Klein, MD  Complications:  None  Estimated blood loss:  <5 mL  Medications administered during procedure:  Ancef 2 g intravenously, lidocaine 1% 30 mL locally  Device details:   New Generator Medtronic Adapta L model number ADDRL1, serial number [id number] Right atrial lead (chronic) Medtronic [id number],  serial number [id number], (implanted 10/14/2005) Right ventricular lead (chronic)  Medtronic [id number],  serial number  [id number],(implanted 10/14/2005)   Explanted generator Medtronic Adapta [id number], serial number  [id number] (implanted 10/14/2005)  Procedure details:  After the risks and benefits of the procedure were discussed the patient provided informed consent. She was brought to the cardiac catheter lab in the fasting state. The patient was prepped and draped in usual sterile fashion. Local anesthesia with 1% lidocaine was administered to to the left infraclavicular area. A 5-6cm horizontal incision was made parallel with and 2-3 cm caudal to the left clavicle, in the area of an old scar. An older scar was seen closer to the left clavicle. Using minimal electrocautery and mostly sharp and blunt dissection the prepectoral pocket was opened carefully to avoid injury to the loops of chronic leads. Extensive dissection was not necessary. The device was explanted. The pocket was carefully inspected for hemostasis and flushed with copious amounts of antibiotic solution.  The leads were disconnected from the old generator and testing of the lead parameters later showed excellent values. The new generator was connected to the chronic leads, with appropriate pacing noted.   The  entire system was then carefully inserted in the pocket with care been taking that the leads and device assumed a comfortable position without pressure on the incision. Great care was taken that the leads be located deep to the generator. The pocket was then closed in layers using 2 layers of 2-0 Vicryl and cutaneous staples after which a sterile dressing was applied.   At the end of the procedure the following lead parameters were encountered:   Right atrial lead sensed P waves 2.5 mV, impedance 517 ohms, threshold 0.5 at 0.5 ms pulse width.  Right ventricular lead sensed R waves  17.5 mV, impedance 627 ohms, threshold 0.7 at 0.5 ms pulse width.  Sanda Klein, MD, [facility] [phone] office [phone] pager

## 2014-03-14 NOTE — H&P (Signed)
03/14/2014 [name]   03/22/1920  [id number]  Primary Physicia [name], MD Primary Cardiologist: Dr. [name]  HPI:  The patient is a 80 y/o male, followed by Dr. Timmie Foerster, who underwent Medtronic PPM insertion in 2006. He also has a h/o persistent atrial fibrillation, treated with Multaq. He presents to clinic today with a complaint of intermittent dizziness x 2 weeks. He denies SOB, chest pain, palpitations, syncope and near syncope.  Pacemaker interrogation in clinic today demonstrates that he reached ERI on 03/02/14. At the time of his last interrogation he was at [facility]. Today is is at [facility] , suggesting he has lost atrial kick. EKG demonstrates a HR of 65 bpm.   Current Outpatient Prescriptions  Medication Sig Dispense Refill  . beta carotene w/minerals (OCUVITE) tablet Take 1 tablet by mouth daily.      Marland Kitchen dronedarone (MULTAQ) 400 MG tablet Take 1 tablet (400 mg total) by mouth 2 (two) times daily with a meal.  180 tablet  3  . furosemide (LASIX) 20 MG tablet Take 1 tablet (20 mg total) by mouth daily.  30 tablet  3  . nebivolol (BYSTOLIC) 2.5 MG tablet Take 1 tablet (2.5 mg total) by mouth daily.  90 tablet  3  . pantoprazole (PROTONIX) 40 MG tablet Take 1 tablet (40 mg total) by mouth daily.  90 tablet  3  . Probiotic Product ([place]) Take by mouth daily.        . ramipril (ALTACE) 2.5 MG capsule Take 1 capsule (2.5 mg total) by mouth daily.  90 capsule  3  . Rivaroxaban (XARELTO) 15 MG TABS tablet Take 1 tablet (15 mg total) by mouth daily.  90 tablet  3  . silodosin (RAPAFLO) 4 MG CAPS capsule Take one capsule every other day at night starting Wednesday  90 capsule  3  . sodium polystyrene (KAYEXALATE) 15 GM/60ML suspension Take 60 mLs (15 g total) by mouth daily.  180 mL  0   No current facility-administered medications for this visit.   Facility-Administered Medications Ordered in Other Visits  Medication Dose Route Frequency Provider Last Rate Last  Dose  . 0.9 %  sodium chloride infusion    Continuous PRN Eligha Bridegroom, CRNA        Allergies  Allergen Reactions  . Codeine     REACTION: nausea    History   Social History  . Marital Status: Widowed    Spouse Name: N/A    Number of Children: N/A  . Years of Education: N/A   Occupational History  . retired    Social History Main Topics  . Smoking status: Former Smoker    Quit date: 11/17/1969  . Smokeless tobacco: Never Used  . Alcohol Use: No     Comment: none since 1990  . Drug Use: No  . Sexual Activity: Not on file   Other Topics Concern  . Not on file   Social History Narrative  . No narrative on file     Review of Systems: General: negative for chills, fever, night sweats or weight changes.  Cardiovascular: negative for chest pain, dyspnea on exertion, edema, orthopnea, palpitations, paroxysmal nocturnal dyspnea or shortness of breath Dermatological: negative for rash Respiratory: negative for cough or wheezing Urologic: negative for hematuria Abdominal: negative for nausea, vomiting, diarrhea, bright red blood per rectum, melena, or hematemesis Neurologic: negative for visual changes, syncope, or dizziness All other systems reviewed and are otherwise negative except as noted  above.    Blood pressure 140/80, pulse 72, height 5\' 6"  (1.676 m), weight 194 lb 14.4 oz (88.406 kg).  General appearance: alert, cooperative and no distress Lungs: clear to auscultation bilaterally Heart: regular rate and rhythm and irregularly irregular rhythm Extremities: no LEE Pulses: 2+ and symmetric Skin: warm and dry Neurologic: Grossly normal  ASSESSMENT AND PLAN:   Pacemaker - Medtronic Adapta 2006 Device interrogation today reveals that ERI was reached 03/02/14. This may possibly be the cause of his recent dizziness, which correlate with the timing his symptoms first developed. Dr. [name] has been notified. We will send to [facility] today for generator change  today.    PLAN  Plan for PPM generator med change with Dr. [name] today.   Brittainy SimmonsPA-C 03/14/2014 2:24 PM  I have seen and examined the patient.  I have reviewed the chart, notes and new data.  I agree with PA's note.  Symptoms probably due to mode switch. Will try to accelerate time to generator changeout due to increased risk of AF onset with asynchronous V pacing. This procedure has been fully reviewed with the patient and written informed consent has been obtained.   Sanda Klein, MD, [place] [phone] 03/14/2014, 3:40 PM

## 2014-03-14 NOTE — Discharge Instructions (Signed)
Supplemental Discharge Instructions for  Pacemaker/Defibrillator Patients  Activity As tolerated   WOUND CARE   Keep the wound area clean and dry.  Remove the dressing the day after you return home (usually 48 hours after the procedure).   DO NOT SUBMERGE UNDER WATER UNTIL FULLY HEALED (no tub baths, hot tubs, swimming pools, etc.).    You  may shower or take a sponge bath after the dressing is removed. DO NOT SOAK the area and do not allow the shower to directly spray on the site.   If you have staples, these will be removed in the office in 7-14 days.   If you have tape/steri-strips on your wound, these will fall off; do not pull them off prematurely.     No bandage is needed on the site.  DO  NOT apply any creams, oils, or ointments to the wound area.   If you notice any drainage or discharge from the wound, any swelling, excessive redness or bruising at the site, or if you develop a fever > 101? F after you are discharged home, call the office at once.  Special Instructions   You are still able to use cellular telephones.  Avoid carrying your cellular phone near your device.   When traveling through airports, show security personnel your identification card to avoid being screened in the metal detectors.    Avoid arc welding equipment, MRI testing (magnetic resonance imaging), TENS units (transcutaneous nerve stimulators).  Call the office for questions about other devices.   Avoid electrical appliances that are in poor condition or are not properly grounded.   Microwave ovens are safe to be near or to operate.   DO NOT TAKE XARELTO tonight 03/14/2014 Restart Xarelto at previous dose tomorrow 03/15/2014

## 2014-03-14 NOTE — Assessment & Plan Note (Signed)
Device interrogation today reveals that ERI was reached 03/02/14. This may possibly be the cause of his recent dizziness, which correlate with the timing his symptoms first developed. Dr. [name] has been notified. We will send to [facility] today for generator change today.

## 2014-03-14 NOTE — Progress Notes (Signed)
Patient ID: [name], male   DOB: [birth year], 80 y.o.   MRN: [id number]    03/14/2014 KELDEN LAVALLEE   July 10, 1920  [id number]  Primary Physicia [name], MD Primary Cardiologist: Dr. [name]  HPI:  The patient is a 80 y/o male, followed by Dr. Timmie Foerster, who underwent Medtronic PPM insertion in 2006. He also has a h/o persistent atrial fibrillation, treated with Multaq. He presents to clinic today with a complaint of intermittent dizziness x 2 weeks. He denies SOB, chest pain, palpitations, syncope and near syncope.  Pacemaker interrogation in clinic today demonstrates that he reached ERI on 03/02/14. At the time of his last interrogation he was at [facility]. Today is is at [facility] , suggesting he has lost atrial kick. EKG demonstrates a HR of 65 bpm.   Current Outpatient Prescriptions  Medication Sig Dispense Refill  . beta carotene w/minerals (OCUVITE) tablet Take 1 tablet by mouth daily.      Marland Kitchen dronedarone (MULTAQ) 400 MG tablet Take 1 tablet (400 mg total) by mouth 2 (two) times daily with a meal.  180 tablet  3  . furosemide (LASIX) 20 MG tablet Take 1 tablet (20 mg total) by mouth daily.  30 tablet  3  . nebivolol (BYSTOLIC) 2.5 MG tablet Take 1 tablet (2.5 mg total) by mouth daily.  90 tablet  3  . pantoprazole (PROTONIX) 40 MG tablet Take 1 tablet (40 mg total) by mouth daily.  90 tablet  3  . Probiotic Product ([place]) Take by mouth daily.        . ramipril (ALTACE) 2.5 MG capsule Take 1 capsule (2.5 mg total) by mouth daily.  90 capsule  3  . Rivaroxaban (XARELTO) 15 MG TABS tablet Take 1 tablet (15 mg total) by mouth daily.  90 tablet  3  . silodosin (RAPAFLO) 4 MG CAPS capsule Take one capsule every other day at night starting Wednesday  90 capsule  3  . sodium polystyrene (KAYEXALATE) 15 GM/60ML suspension Take 60 mLs (15 g total) by mouth daily.  180 mL  0   No current facility-administered medications for this visit.   Facility-Administered Medications  Ordered in Other Visits  Medication Dose Route Frequency Provider Last Rate Last Dose  . 0.9 %  sodium chloride infusion    Continuous PRN Eligha Bridegroom, CRNA        Allergies  Allergen Reactions  . Codeine     REACTION: nausea    History   Social History  . Marital Status: Widowed    Spouse Name: N/A    Number of Children: N/A  . Years of Education: N/A   Occupational History  . retired    Social History Main Topics  . Smoking status: Former Smoker    Quit date: 11/17/1969  . Smokeless tobacco: Never Used  . Alcohol Use: No     Comment: none since 1990  . Drug Use: No  . Sexual Activity: Not on file   Other Topics Concern  . Not on file   Social History Narrative  . No narrative on file     Review of Systems: General: negative for chills, fever, night sweats or weight changes.  Cardiovascular: negative for chest pain, dyspnea on exertion, edema, orthopnea, palpitations, paroxysmal nocturnal dyspnea or shortness of breath Dermatological: negative for rash Respiratory: negative for cough or wheezing Urologic: negative for hematuria Abdominal: negative for nausea, vomiting, diarrhea, bright red blood per rectum, melena, or hematemesis Neurologic: negative  for visual changes, syncope, or dizziness All other systems reviewed and are otherwise negative except as noted above.    Blood pressure 140/80, pulse 72, height 5\' 6"  (1.676 m), weight 194 lb 14.4 oz (88.406 kg).  General appearance: alert, cooperative and no distress Lungs: clear to auscultation bilaterally Heart: regular rate and rhythm and irregularly irregular rhythm Extremities: no LEE Pulses: 2+ and symmetric Skin: warm and dry Neurologic: Grossly normal  ASSESSMENT AND PLAN:   Pacemaker - Medtronic Adapta 2006 Device interrogation today reveals that ERI was reached 03/02/14. This may possibly be the cause of his recent dizziness, which correlate with the timing his symptoms first developed. Dr.  [name] has been notified. We will send to [facility] today for generator change today.    PLAN  Plan for PPM generator med change with Dr. [name] today.   Brittainy SimmonsPA-C 03/14/2014 2:24 PM

## 2014-03-23 ENCOUNTER — Ambulatory Visit (INDEPENDENT_AMBULATORY_CARE_PROVIDER_SITE_OTHER): Payer: Medicare Other | Admitting: *Deleted

## 2014-03-23 DIAGNOSIS — I4891 Unspecified atrial fibrillation: Secondary | ICD-10-CM

## 2014-03-23 DIAGNOSIS — R001 Bradycardia, unspecified: Secondary | ICD-10-CM

## 2014-03-23 DIAGNOSIS — I498 Other specified cardiac arrhythmias: Secondary | ICD-10-CM

## 2014-03-23 DIAGNOSIS — I495 Sick sinus syndrome: Secondary | ICD-10-CM

## 2014-03-23 LAB — MDC_IDC_ENUM_SESS_TYPE_INCLINIC
Battery Impedance: 100 Ohm
Battery Remaining Longevity: 141 mo
Brady Statistic AP VS Percent: 0 %
Brady Statistic AS VP Percent: 1 %
Date Time Interrogation Session: 20150507115752
Lead Channel Impedance Value: 522 Ohm
Lead Channel Impedance Value: 625 Ohm
Lead Channel Pacing Threshold Amplitude: 0.5 V
Lead Channel Pacing Threshold Pulse Width: 0.4 ms
Lead Channel Setting Pacing Amplitude: 2.5 V
Lead Channel Setting Sensing Sensitivity: 4 mV
MDC IDC MSMT BATTERY VOLTAGE: 2.8 V
MDC IDC MSMT LEADCHNL RA PACING THRESHOLD AMPLITUDE: 0.5 V
MDC IDC MSMT LEADCHNL RA PACING THRESHOLD PULSEWIDTH: 0.4 ms
MDC IDC SET LEADCHNL RA PACING AMPLITUDE: 2 V
MDC IDC SET LEADCHNL RV PACING PULSEWIDTH: 0.4 ms
MDC IDC STAT BRADY AP VP PERCENT: 99 %
MDC IDC STAT BRADY AS VS PERCENT: 0 %

## 2014-03-23 LAB — PACEMAKER DEVICE OBSERVATION

## 2014-03-23 MED ORDER — NEBIVOLOL HCL 10 MG PO TABS: 10.0000 mg | ORAL_TABLET | Freq: Every day | ORAL | Status: DC

## 2014-03-23 NOTE — Progress Notes (Signed)
Wound check appointment. Staples removed. Wound without redness or edema. Incision edges approximated, wound well healed. Normal device function. Thresholds and impedances consistent with implant measurements. Device programmed at appropriate safety margins. Histogram distribution appropriate for patient and level of activity. No mode switches or high ventricular rates noted. Patient educated about wound care, arm mobility, lifting restrictions. ROV on 7-16 with [facility].

## 2014-04-03 ENCOUNTER — Ambulatory Visit (INDEPENDENT_AMBULATORY_CARE_PROVIDER_SITE_OTHER): Payer: Medicare Other | Admitting: *Deleted

## 2014-04-03 ENCOUNTER — Other Ambulatory Visit: Payer: Self-pay | Admitting: *Deleted

## 2014-04-03 DIAGNOSIS — D518 Other vitamin B12 deficiency anemias: Secondary | ICD-10-CM

## 2014-04-03 MED ORDER — CYANOCOBALAMIN 1000 MCG/ML IJ SOLN
1000.0000 ug | Freq: Once | INTRAMUSCULAR | Status: AC
  Administered 2014-04-03: 1000 ug via INTRAMUSCULAR

## 2014-04-03 MED ORDER — SILODOSIN 4 MG PO CAPS: ORAL_CAPSULE | ORAL | Status: DC

## 2014-04-03 NOTE — Progress Notes (Signed)
   Pt in clinic today for Vitamin B-12 injection.  Cyanocobalamin 1,000 mcg/mL given in right deltoid per pt request.  Derl Barrow, RN

## 2014-04-17 ENCOUNTER — Ambulatory Visit (INDEPENDENT_AMBULATORY_CARE_PROVIDER_SITE_OTHER): Payer: Medicare Other | Admitting: Family Medicine

## 2014-04-17 ENCOUNTER — Encounter: Payer: Self-pay | Admitting: Family Medicine

## 2014-04-17 VITALS — BP 168/89 | HR 91 | Temp 97.7°F | Wt 188.0 lb

## 2014-04-17 DIAGNOSIS — R300 Dysuria: Secondary | ICD-10-CM

## 2014-04-17 DIAGNOSIS — N390 Urinary tract infection, site not specified: Secondary | ICD-10-CM

## 2014-04-17 LAB — POCT URINALYSIS DIPSTICK
BILIRUBIN UA: NEGATIVE
GLUCOSE UA: NEGATIVE
Ketones, UA: NEGATIVE
Nitrite, UA: NEGATIVE
Protein, UA: NEGATIVE
Spec Grav, UA: 1.015
UROBILINOGEN UA: 0.2
pH, UA: 6.5

## 2014-04-17 LAB — POCT UA - MICROSCOPIC ONLY

## 2014-04-17 MED ORDER — SILODOSIN 4 MG PO CAPS: ORAL_CAPSULE | ORAL | Status: DC

## 2014-04-17 MED ORDER — AMPICILLIN 500 MG PO CAPS: 500.0000 mg | ORAL_CAPSULE | Freq: Three times a day (TID) | ORAL | Status: DC

## 2014-04-17 NOTE — Patient Instructions (Signed)
Urinary Tract Infection  Urinary tract infections (UTIs) can develop anywhere along your urinary tract. Your urinary tract is your body's drainage system for removing wastes and extra water. Your urinary tract includes two kidneys, two ureters, a bladder, and a urethra. Your kidneys are a pair of bean-shaped organs. Each kidney is about the size of your fist. They are located below your ribs, one on each side of your spine.  CAUSES  Infections are caused by microbes, which are microscopic organisms, including fungi, viruses, and bacteria. These organisms are so small that they can only be seen through a microscope. Bacteria are the microbes that most commonly cause UTIs.  SYMPTOMS   Symptoms of UTIs may vary by age and gender of the patient and by the location of the infection. Symptoms in young women typically include a frequent and intense urge to urinate and a painful, burning feeling in the bladder or urethra during urination. Older women and men are more likely to be tired, shaky, and weak and have muscle aches and abdominal pain. A fever may mean the infection is in your kidneys. Other symptoms of a kidney infection include pain in your back or sides below the ribs, nausea, and vomiting.  DIAGNOSIS  To diagnose a UTI, your caregiver will ask you about your symptoms. Your caregiver also will ask to provide a urine sample. The urine sample will be tested for bacteria and white blood cells. White blood cells are made by your body to help fight infection.  TREATMENT   Typically, UTIs can be treated with medication. Because most UTIs are caused by a bacterial infection, they usually can be treated with the use of antibiotics. The choice of antibiotic and length of treatment depend on your symptoms and the type of bacteria causing your infection.  HOME CARE INSTRUCTIONS   If you were prescribed antibiotics, take them exactly as your caregiver instructs you. Finish the medication even if you feel better after you  have only taken some of the medication.   Drink enough water and fluids to keep your urine clear or pale yellow.   Avoid caffeine, tea, and carbonated beverages. They tend to irritate your bladder.   Empty your bladder often. Avoid holding urine for long periods of time.   Empty your bladder before and after sexual intercourse.   After a bowel movement, women should cleanse from front to back. Use each tissue only once.  SEEK MEDICAL CARE IF:    You have back pain.   You develop a fever.   Your symptoms do not begin to resolve within 3 days.  SEEK IMMEDIATE MEDICAL CARE IF:    You have severe back pain or lower abdominal pain.   You develop chills.   You have nausea or vomiting.   You have continued burning or discomfort with urination.  MAKE SURE YOU:    Understand these instructions.   Will watch your condition.   Will get help right away if you are not doing well or get worse.  Document Released: 08/13/2005 Document Revised: 05/04/2012 Document Reviewed: 12/12/2011  ExitCare Patient Information 2014 [employer].

## 2014-04-17 NOTE — Progress Notes (Signed)
    Subjective:    Patient ID: [name] is a 80 y.o. male presenting with Urinary Tract Infection  on 04/17/2014  HPI: Reports dysuria x 5 days.  Has seen Urology and had negative w/u. Has BPH.  Has had several Enterococcus UTI's in the past year.  Denies fever or chills.  Review of Systems  Constitutional: Negative for fever and chills.  Respiratory: Negative for shortness of breath.   Gastrointestinal: Positive for abdominal pain.  Genitourinary: Positive for dysuria and frequency.  Skin: Negative for pallor.      Objective:    BP 168/89  Pulse 91  Temp(Src) 97.7 F (36.5 C) (Oral)  Wt 188 lb (85.276 kg) Physical Exam  Constitutional: He appears well-developed and well-nourished. No distress.  Neck: Neck supple.  Cardiovascular: Normal rate.   Pulmonary/Chest: Effort normal.  Abdominal: Soft. There is tenderness (low abdomen).  Musculoskeletal:  No CVA tenderness  Skin: Skin is warm.  Psychiatric: He has a normal mood and affect.    Urinalysis    Component Value Date/Time   COLORURINE RED* 12/30/2013 0256   APPEARANCEUR CLOUDY* 12/30/2013 0256   LABSPEC 1.013 12/30/2013 0256   PHURINE 6.0 12/30/2013 0256   GLUCOSEU NEGATIVE 12/30/2013 0256   HGBUR LARGE* 12/30/2013 0256   BILIRUBINUR NEGATIVE 04/17/2014 1034   BILIRUBINUR NEGATIVE 12/30/2013 0256   KETONESUR NEGATIVE 12/30/2013 0256   PROTEINUR NEGATIVE 04/17/2014 1034   PROTEINUR 30* 12/30/2013 0256   UROBILINOGEN 0.2 04/17/2014 1034   UROBILINOGEN 0.2 12/30/2013 0256   NITRITE NEGATIVE 04/17/2014 1034   NITRITE NEGATIVE 12/30/2013 0256   LEUKOCYTESUR large (3+) 04/17/2014 1034        Assessment & Plan:  Dysuria - Plan: POCT Urinalysis Dipstick, POCT UA - Microscopic Only  UTI (urinary tract infection) - Plan: ampicillin (PRINCIPEN) 500 MG capsule, Urine culture    Return in about 3 months (around 07/18/2014) for a follow-up.

## 2014-04-18 ENCOUNTER — Telehealth: Payer: Self-pay | Admitting: Family Medicine

## 2014-04-18 NOTE — Telephone Encounter (Signed)
Pt's daughter in law called stating pt is passing blood in his urine.  Pt seen in office 04/17/2014 and given antibiotic for UTI.  [name] stated that this has happen before, pt will urinate some blood but the next time it is clear.  Precepted with Dr. Erin Hearing, have pt continue antibiotics and follow with Dr. Nori Riis next week.  Appt scheduled for 04/26/2014.  [name] advised to take pt to urgent care if symptoms of UTI worsen, increase in bleeding or fever.  Will forward to PCP.  Derl Barrow, RN

## 2014-04-18 NOTE — Telephone Encounter (Signed)
Patient seen yesterday for dysuria and diagnosed with UTI. Now patient is passing blood in urine. Would like to speak to nurse. Please daughter in law, Janean Sark back at [phone]

## 2014-04-19 LAB — URINE CULTURE

## 2014-04-26 ENCOUNTER — Encounter: Payer: Self-pay | Admitting: Family Medicine

## 2014-04-26 ENCOUNTER — Ambulatory Visit (INDEPENDENT_AMBULATORY_CARE_PROVIDER_SITE_OTHER): Payer: Medicare Other | Admitting: Family Medicine

## 2014-04-26 VITALS — BP 153/78 | HR 92 | Temp 97.6°F | Ht 66.0 in | Wt 189.2 lb

## 2014-04-26 DIAGNOSIS — M25559 Pain in unspecified hip: Secondary | ICD-10-CM

## 2014-04-26 DIAGNOSIS — I10 Essential (primary) hypertension: Secondary | ICD-10-CM

## 2014-04-26 DIAGNOSIS — M25551 Pain in right hip: Secondary | ICD-10-CM

## 2014-04-26 DIAGNOSIS — Z23 Encounter for immunization: Secondary | ICD-10-CM

## 2014-04-26 DIAGNOSIS — D518 Other vitamin B12 deficiency anemias: Secondary | ICD-10-CM

## 2014-04-26 DIAGNOSIS — N138 Other obstructive and reflux uropathy: Secondary | ICD-10-CM

## 2014-04-26 DIAGNOSIS — G8929 Other chronic pain: Secondary | ICD-10-CM

## 2014-04-26 DIAGNOSIS — N401 Enlarged prostate with lower urinary tract symptoms: Secondary | ICD-10-CM

## 2014-04-26 MED ORDER — FINASTERIDE 5 MG PO TABS: ORAL_TABLET | ORAL | Status: DC

## 2014-04-26 MED ORDER — CYANOCOBALAMIN 1000 MCG/ML IJ SOLN
1000.0000 ug | Freq: Once | INTRAMUSCULAR | Status: AC
  Administered 2014-04-26: 1000 ug via INTRAMUSCULAR

## 2014-04-26 NOTE — Assessment & Plan Note (Signed)
Blood pressure is at goal. 

## 2014-04-26 NOTE — Patient Instructions (Signed)
Lets try teh finasteride every other day wit the rapaflo (alternate them) and see if this helps your bladder. Let me see you back in a month

## 2014-04-26 NOTE — Progress Notes (Signed)
   Subjective:    Patient ID: [name], male    DOB: [birth year], 80 y.o.   MRN: [id number]  HPI Continued problems with poor urinary stream and sensation of incomplete but he anything his bladder. We had decreased his prostate medicine to every other day and his dizziness has significantly improved. The last time he saw the urologist, he said the urologist was not "interested in taking care of me anymore" and he really doesn't want to go back there. #2. Followup hyperglycemia. He brings his blood sugars. None above 150 #3. Hypertension. He feels like his blood pressure is doing the best it has for while we changed his BPH medicines improvement in his dizziness. He continues to take his other medications regularly without any problems. #4. Hip pain. He is having some occasional right hip pain that radiates to the groin area. He is status post left hip replacement. He does not want to pursue getting a right hip done the wonders if it's going to start give him more problems. He has an appointment with his orthopedist this followup his left hip.   Review of Systems No fever, sweats, chills. Positive for urinary symptoms as per history of present illness. No weight loss. Energy level is good.    Objective:   Physical Exam Vital signs reviewed. GENERAL: Well-developed, well-nourished, no acute distress. CARDIOVASCULAR: Regular rate and rhythm no murmur gallop or rub LUNGS: Clear to auscultation bilaterally, no rales or wheeze. ABDOMEN: Soft positive bowel sounds NEURO: No gross focal neurological deficits. MSK: Movement of extremity x 4. Is able to get up out of a chair without any assistance and has a fairly normal gait.         Assessment & Plan:

## 2014-04-26 NOTE — Assessment & Plan Note (Signed)
He Re: has followup for his left hip. I asked him to have the orthopedist also evaluate the right hip. She doesn't really want to pursue surgery, not sure there's [place] we can do additionally. Given his kidney disease, NSAIDs are probably not a good idea appear

## 2014-04-26 NOTE — Assessment & Plan Note (Signed)
But not exactly sure what to do with his BPH symptoms. We'll try adding finasteride alternating every other day with Rapaflo. He'll see me back in 3-4 weeks and see how this is doing. Should he get return of his dizziness, he'll stop the finasteride.

## 2014-05-26 ENCOUNTER — Ambulatory Visit (INDEPENDENT_AMBULATORY_CARE_PROVIDER_SITE_OTHER): Payer: Medicare Other | Admitting: *Deleted

## 2014-05-26 DIAGNOSIS — D518 Other vitamin B12 deficiency anemias: Secondary | ICD-10-CM

## 2014-05-26 MED ORDER — CYANOCOBALAMIN 1000 MCG/ML IJ SOLN
1000.0000 ug | Freq: Once | INTRAMUSCULAR | Status: AC
  Administered 2014-05-26: 1000 ug via INTRAMUSCULAR

## 2014-05-26 NOTE — Progress Notes (Signed)
   Pt in nurse clinic for vitamin B-12 injection.  Cyanocobalamin 1055mcg/mL given Right deltoid.  Derl Barrow, RN

## 2014-05-30 ENCOUNTER — Encounter: Payer: Self-pay | Admitting: Cardiovascular Disease

## 2014-05-30 ENCOUNTER — Ambulatory Visit (INDEPENDENT_AMBULATORY_CARE_PROVIDER_SITE_OTHER): Payer: Medicare Other | Admitting: Cardiovascular Disease

## 2014-05-30 VITALS — BP 132/74 | HR 88 | Ht 66.0 in | Wt 191.4 lb

## 2014-05-30 DIAGNOSIS — R079 Chest pain, unspecified: Secondary | ICD-10-CM

## 2014-05-30 DIAGNOSIS — Z950 Presence of cardiac pacemaker: Secondary | ICD-10-CM

## 2014-05-30 DIAGNOSIS — I4891 Unspecified atrial fibrillation: Secondary | ICD-10-CM

## 2014-05-30 DIAGNOSIS — I480 Paroxysmal atrial fibrillation: Secondary | ICD-10-CM

## 2014-05-30 DIAGNOSIS — R001 Bradycardia, unspecified: Secondary | ICD-10-CM

## 2014-05-30 DIAGNOSIS — I495 Sick sinus syndrome: Secondary | ICD-10-CM

## 2014-05-30 DIAGNOSIS — I498 Other specified cardiac arrhythmias: Secondary | ICD-10-CM

## 2014-05-30 LAB — PACEMAKER DEVICE OBSERVATION

## 2014-05-30 MED ORDER — DRONEDARONE HCL 400 MG PO TABS: 400.0000 mg | ORAL_TABLET | Freq: Two times a day (BID) | ORAL | Status: DC

## 2014-05-30 NOTE — Patient Instructions (Signed)

## 2014-05-31 LAB — MDC_IDC_ENUM_SESS_TYPE_INCLINIC
Battery Remaining Longevity: 131 mo
Battery Voltage: 2.8 V
Brady Statistic AP VP Percent: 100 %
Brady Statistic AS VP Percent: 0 %
Lead Channel Impedance Value: 530 Ohm
Lead Channel Pacing Threshold Amplitude: 0.5 V
Lead Channel Pacing Threshold Amplitude: 0.75 V
Lead Channel Pacing Threshold Pulse Width: 0.4 ms
Lead Channel Sensing Intrinsic Amplitude: 15.67 mV
Lead Channel Sensing Intrinsic Amplitude: 2 mV
Lead Channel Setting Pacing Amplitude: 2 V
Lead Channel Setting Pacing Amplitude: 2.5 V
Lead Channel Setting Pacing Pulse Width: 0.4 ms
Lead Channel Setting Sensing Sensitivity: 4 mV
MDC IDC MSMT BATTERY IMPEDANCE: 100 Ohm
MDC IDC MSMT LEADCHNL RA PACING THRESHOLD PULSEWIDTH: 0.4 ms
MDC IDC MSMT LEADCHNL RV IMPEDANCE VALUE: 664 Ohm
MDC IDC SESS DTM: 20150714143042
MDC IDC STAT BRADY AP VS PERCENT: 0 %
MDC IDC STAT BRADY AS VS PERCENT: 0 %

## 2014-05-31 NOTE — Progress Notes (Signed)
Patient ID: [name], male   DOB: May 06, 1920, 80 y.o.   MRN: [id number]      Reason for office visit Pacemaker follow up  Now 3 months s/p pacemaker generator change. Generally feels well, rare fleeting palpitations, dyspnea and atypical chest twinges. Pacemaker interrogation does not show interim arhythmia. 100% sequential AV pacing.  [name] has a long standing history of sinus node dysfunction (initial pacemaker insertion 2006), subsequently developed high grade AV block and a history of persistent atrial fibrillation which has responded well to dronedarone. He required a cardioversion in 2013. On Xarelto for embolic prevention, no bleeding complications. He has well controlled HTN and mild, diet controlled type 2 DM.   Allergies  Allergen Reactions  . Codeine     REACTION: nausea    Current Outpatient Prescriptions  Medication Sig Dispense Refill  . beta carotene w/minerals (OCUVITE) tablet Take 1 tablet by mouth daily.      Marland Kitchen dronedarone (MULTAQ) 400 MG tablet Take 1 tablet (400 mg total) by mouth 2 (two) times daily with a meal.  180 tablet  3  . finasteride (PROSCAR) 5 MG tablet Take one by mouth every other day in combo with the rapaflo as directed  15 tablet  1  . nebivolol (BYSTOLIC) 10 MG tablet Take 1 tablet (10 mg total) by mouth daily.      . pantoprazole (PROTONIX) 40 MG tablet Take 1 tablet (40 mg total) by mouth daily.  90 tablet  3  . Probiotic Product ([place]) Take by mouth daily.        . ramipril (ALTACE) 2.5 MG capsule Take 1 capsule (2.5 mg total) by mouth daily.  90 capsule  3  . Rivaroxaban (XARELTO) 15 MG TABS tablet Take 1 tablet (15 mg total) by mouth daily.  90 tablet  3  . silodosin (RAPAFLO) 4 MG CAPS capsule Take one capsule every other day at night starting Wednesday  90 capsule  3   No current facility-administered medications for this visit.   Facility-Administered Medications Ordered in Other Visits  Medication Dose Route  Frequency Provider Last Rate Last Dose  . 0.9 %  sodium chloride infusion    Continuous PRN Eligha Bridegroom, CRNA        Past Medical History  Diagnosis Date  . Overactive bladder   . Arthritis   . Diverticulosis of colon (without mention of hemorrhage)   . Gout   . Chronic kidney disease, stage III (moderate)   . Family history of malignant neoplasm of gastrointestinal tract   . Stricture and stenosis of esophagus   . Acute gastritis without mention of hemorrhage   . Unspecified cataract   . Hypertrophy of prostate with urinary obstruction and other lower urinary tract symptoms (LUTS)   . Personal history of colonic polyps 10/21/2006    hyperplastic   . Anemia   . Vitamin B12 deficiency   . Hiatal hernia   . Type II or unspecified type diabetes mellitus without mention of complication, not stated as uncontrolled     pt states that he checks his blood sugar once weekly, is not on oral meds or insulin  . Atrial fibrillation   . Sinus node dysfunction   . Presence of permanent cardiac pacemaker 10/14/05    medtronic Adapta  . Hypertension     Past Surgical History  Procedure Laterality Date  . Appendectomy    . Tonsillectomy    . Pacemaker insertion    . Total  hip arthroplasty    . Cardioversion  10/01/2011    Procedure: CARDIOVERSION;  Surgeon: Dani Gobble Euna Armon;  Location: [facility] OR;  Service: Cardiovascular;  Laterality: N/A;  OP CARDIOVERSION  . Cardioversion  11/02/12    successful    Family History  Problem Relation Age of Onset  . Colon cancer Brother 28  . Hypertension Brother   . Hypertension Father   . Parkinsonism Mother   . Hodgkin's lymphoma Grandchild     History   Social History  . Marital Status: Widowed    Spouse Name: N/A    Number of Children: N/A  . Years of Education: N/A   Occupational History  . retired    Social History Main Topics  . Smoking status: Former Smoker    Quit date: 11/17/1969  . Smokeless tobacco: Never Used  . Alcohol Use: No      Comment: none since 1990  . Drug Use: No  . Sexual Activity: Not on file   Other Topics Concern  . Not on file   Social History Narrative  . No narrative on file    Review of systems: The patient specifically denies any chest pain with exertion, dyspnea with exertion, orthopnea, paroxysmal nocturnal dyspnea, syncope, palpitations, focal neurological deficits, intermittent claudication, lower extremity edema, unexplained weight gain, cough, hemoptysis or wheezing.  The patient also denies abdominal pain, nausea, vomiting, dysphagia, diarrhea, constipation, polyuria, polydipsia, dysuria, hematuria, frequency, urgency, abnormal bleeding or bruising, fever, chills, unexpected weight changes, mood swings, change in skin or hair texture, change in voice quality, auditory or visual problems, allergic reactions or rashes, new musculoskeletal complaints other than usual "aches and pains".   PHYSICAL EXAM BP 132/74  Pulse 88  Ht 5\' 6"  (1.676 m)  Wt 191 lb 6.4 oz (86.818 kg)  BMI 30.91 kg/m2 General: Alert, oriented x3, no distress  Head: no evidence of trauma, PERRL, EOMI, no exophtalmos or lid lag, no myxedema, no xanthelasma; normal ears, nose and oropharynx  Neck: normal jugular venous pulsations and no hepatojugular reflux; brisk carotid pulses without delay and no carotid bruits  Chest: clear to auscultation, no signs of consolidation by percussion or palpation, normal fremitus, symmetrical and full respiratory excursions  Cardiovascular: normal position and quality of the apical impulse, regular rhythm, normal first and second heart sounds, no murmurs, rubs or gallops  Abdomen: no tenderness or distention, no masses by palpation, no abnormal pulsatility or arterial bruits, normal bowel sounds, no hepatosplenomegaly  Extremities: no clubbing, cyanosis or edema; 2+ radial, ulnar and brachial pulses bilaterally; 2+ right femoral, posterior tibial and dorsalis pedis pulses; 2+ left femoral,  posterior tibial and dorsalis pedis pulses; no subclavian or femoral bruits  Neurological: grossly nonfocal   EKG: AV sequential pacing   Lipid Panel     Component Value Date/Time   CHOL 152 07/05/2008 2038   TRIG 315* 07/05/2008 2038   HDL 28* 07/05/2008 2038   CHOLHDL 5.4 Ratio 07/05/2008 2038   VLDL 63* 07/05/2008 2038   LDLCALC 61 07/05/2008 2038    BMET    Component Value Date/Time   NA 131* 03/06/2014 1011   K 5.6* 03/06/2014 1011   CL 100 03/06/2014 1011   CO2 23 03/06/2014 1011   GLUCOSE 108* 03/06/2014 1011   BUN 33* 03/06/2014 1011   CREATININE 1.64* 03/06/2014 1011   CREATININE 1.45* 12/30/2013 0316   CALCIUM 8.7 03/06/2014 1011   GFRNONAA 41* 12/30/2013 0316   GFRAA 47* 12/30/2013 0316     ASSESSMENT AND  PLAN Pacemaker - Medtronic Adapta 2006, gen change 2015 Normal device function. Carelink Q3 months.  AF (atrial fibrillation) No recent events. Continue Multaq and Xarelto.   Patient Instructions  Remote monitoring is used to monitor your Pacemaker or ICD from home. This monitoring reduces the number of office visits required to check your device to one time per year. It allows [place] to monitor the functioning of your device to ensure it is working properly. You are scheduled for a device check from home on August 31, 2014. You may send your transmission at any time that day. If you have a wireless device, the transmission will be sent automatically. After your physician reviews your transmission, you will receive a postcard with your next transmission date.  Dr. [name] recommends that you schedule a follow-up appointment in: 6 Months.       Orders Placed This Encounter  Procedures  . Implantable device check  . EKG 12-Lead   Meds ordered this encounter  Medications  . dronedarone (MULTAQ) 400 MG tablet    Sig: Take 1 tablet (400 mg total) by mouth 2 (two) times daily with a meal.    Dispense:  180 tablet    Refill:  [place] Jannell Franta,  MD, [facility] [phone] office [phone] pager

## 2014-05-31 NOTE — Assessment & Plan Note (Signed)
Normal device function. Carelink Q3 months.

## 2014-05-31 NOTE — Assessment & Plan Note (Signed)
No recent events. Continue Multaq and Xarelto.

## 2014-06-01 ENCOUNTER — Encounter: Payer: Medicare Other | Admitting: Cardiovascular Disease

## 2014-06-13 ENCOUNTER — Encounter: Payer: Self-pay | Admitting: Cardiovascular Disease

## 2014-06-27 ENCOUNTER — Other Ambulatory Visit: Payer: Self-pay | Admitting: *Deleted

## 2014-06-27 ENCOUNTER — Ambulatory Visit (INDEPENDENT_AMBULATORY_CARE_PROVIDER_SITE_OTHER): Payer: Medicare Other | Admitting: *Deleted

## 2014-06-27 DIAGNOSIS — D518 Other vitamin B12 deficiency anemias: Secondary | ICD-10-CM

## 2014-06-27 MED ORDER — TRAMADOL HCL 50 MG PO TABS: ORAL_TABLET | ORAL | Status: DC

## 2014-06-27 MED ORDER — CYANOCOBALAMIN 1000 MCG/ML IJ SOLN
1000.0000 ug | Freq: Once | INTRAMUSCULAR | Status: AC
  Administered 2014-06-27: 1000 ug via INTRAMUSCULAR

## 2014-06-27 MED ORDER — PANTOPRAZOLE SODIUM 40 MG PO TBEC: 40.0000 mg | DELAYED_RELEASE_TABLET | Freq: Every day | ORAL | Status: DC

## 2014-06-27 NOTE — Progress Notes (Signed)
   Pt in nurse clinic for his Vitamin B 12 injection.  Injection given Left deltoid.  Pt to call with questions or concerns.  Derl Barrow, RN

## 2014-06-27 NOTE — Telephone Encounter (Signed)
Pt in nurse clinic needing a refill on tramadol.  Pt stated he did not receive prescriptions from his last visit.  Pt stated that Dr. Nori Riis was going to mail him the Rx.  Will forward to PCP. Derl Barrow, RN

## 2014-07-10 ENCOUNTER — Other Ambulatory Visit: Payer: Self-pay | Admitting: *Deleted

## 2014-07-12 MED ORDER — FINASTERIDE 5 MG PO TABS: ORAL_TABLET | ORAL | Status: DC

## 2014-07-28 ENCOUNTER — Other Ambulatory Visit: Payer: Self-pay | Admitting: *Deleted

## 2014-07-28 ENCOUNTER — Ambulatory Visit (INDEPENDENT_AMBULATORY_CARE_PROVIDER_SITE_OTHER): Payer: Medicare Other | Admitting: *Deleted

## 2014-07-28 ENCOUNTER — Ambulatory Visit: Payer: Medicare Other

## 2014-07-28 DIAGNOSIS — D518 Other vitamin B12 deficiency anemias: Secondary | ICD-10-CM

## 2014-07-28 DIAGNOSIS — Z23 Encounter for immunization: Secondary | ICD-10-CM

## 2014-07-28 MED ORDER — CYANOCOBALAMIN 1000 MCG/ML IJ SOLN
1000.0000 ug | Freq: Once | INTRAMUSCULAR | Status: AC
  Administered 2014-07-28: 1000 ug via INTRAMUSCULAR

## 2014-07-28 NOTE — Progress Notes (Signed)
   Pt in nurse clinic for Vitamin B-12 and Flu shot.  Cyanocobalamin 1000 mcg/ml given left deltoid.  Pt to call for next appointment.  Derl Barrow, RN

## 2014-07-31 ENCOUNTER — Encounter: Payer: Self-pay | Admitting: Cardiology

## 2014-07-31 MED ORDER — FINASTERIDE 5 MG PO TABS: ORAL_TABLET | ORAL | Status: DC

## 2014-08-16 ENCOUNTER — Encounter: Payer: Self-pay | Admitting: Family Medicine

## 2014-08-16 ENCOUNTER — Ambulatory Visit (INDEPENDENT_AMBULATORY_CARE_PROVIDER_SITE_OTHER): Payer: Medicare Other | Admitting: Family Medicine

## 2014-08-16 VITALS — BP 137/71 | HR 88 | Temp 97.4°F | Ht 66.0 in | Wt 191.2 lb

## 2014-08-16 DIAGNOSIS — R3914 Feeling of incomplete bladder emptying: Secondary | ICD-10-CM

## 2014-08-16 DIAGNOSIS — R001 Bradycardia, unspecified: Secondary | ICD-10-CM

## 2014-08-16 DIAGNOSIS — I10 Essential (primary) hypertension: Secondary | ICD-10-CM

## 2014-08-16 DIAGNOSIS — R339 Retention of urine, unspecified: Secondary | ICD-10-CM

## 2014-08-16 DIAGNOSIS — Z7901 Long term (current) use of anticoagulants: Secondary | ICD-10-CM

## 2014-08-16 DIAGNOSIS — K222 Esophageal obstruction: Secondary | ICD-10-CM

## 2014-08-16 DIAGNOSIS — I498 Other specified cardiac arrhythmias: Secondary | ICD-10-CM

## 2014-08-16 DIAGNOSIS — N401 Enlarged prostate with lower urinary tract symptoms: Secondary | ICD-10-CM

## 2014-08-16 LAB — CBC WITH DIFFERENTIAL/PLATELET
BASOS PCT: 1 % (ref 0–1)
Basophils Absolute: 0.1 10*3/uL (ref 0.0–0.1)
EOS ABS: 0.4 10*3/uL (ref 0.0–0.7)
Eosinophils Relative: 5 % (ref 0–5)
HCT: 39.4 % (ref 39.0–52.0)
Hemoglobin: 13.4 g/dL (ref 13.0–17.0)
Lymphocytes Relative: 25 % (ref 12–46)
Lymphs Abs: 1.9 10*3/uL (ref 0.7–4.0)
MCH: 31.2 pg (ref 26.0–34.0)
MCHC: 34 g/dL (ref 30.0–36.0)
MCV: 91.8 fL (ref 78.0–100.0)
Monocytes Absolute: 0.5 10*3/uL (ref 0.1–1.0)
Monocytes Relative: 7 % (ref 3–12)
NEUTROS PCT: 62 % (ref 43–77)
Neutro Abs: 4.7 10*3/uL (ref 1.7–7.7)
Platelets: 184 10*3/uL (ref 150–400)
RBC: 4.29 MIL/uL (ref 4.22–5.81)
RDW: 14.4 % (ref 11.5–15.5)
WBC: 7.5 10*3/uL (ref 4.0–10.5)

## 2014-08-16 LAB — BASIC METABOLIC PANEL
BUN: 21 mg/dL (ref 6–23)
CHLORIDE: 99 meq/L (ref 96–112)
CO2: 20 meq/L (ref 19–32)
CREATININE: 1.45 mg/dL — AB (ref 0.50–1.35)
Calcium: 9.1 mg/dL (ref 8.4–10.5)
Glucose, Bld: 106 mg/dL — ABNORMAL HIGH (ref 70–99)
Potassium: 5.2 mEq/L (ref 3.5–5.3)
Sodium: 135 mEq/L (ref 135–145)

## 2014-08-16 MED ORDER — FINASTERIDE 5 MG PO TABS: ORAL_TABLET | ORAL | Status: DC

## 2014-08-16 MED ORDER — RAMIPRIL 2.5 MG PO CAPS: 2.5000 mg | ORAL_CAPSULE | Freq: Every day | ORAL | Status: DC

## 2014-08-17 ENCOUNTER — Encounter: Payer: Self-pay | Admitting: Family Medicine

## 2014-08-18 NOTE — Assessment & Plan Note (Signed)
Sounds like a stricture issues have returned. His gastroenterologist is retired so I called over there today and got him an appointment to be seen next week. Hopefully they can handle this for him. Should it worsen in the meantime, he should go to the emergency department the

## 2014-08-18 NOTE — Progress Notes (Signed)
   Subjective:    Patient ID: [name], male    DOB: October 23, 1920, 80 y.o.   MRN: [id number]  HPI #1. Dysphagia. Previously has had multiple episodes of stretching of his esophageal stricture. Has not had procedure done in many months. Is having a lot of coughing during the day and coughs up cold chunks of food. Also having some mild pain with swallowing nonliquid foods. No heartburn. #2. He some refills on his blood pressure medicines #3. Is having some improvement on the alternating medications for his BPH and urinary hesitancy. He needs refills on his.   Review of Systems No abdominal pain, no change in bowel habits. No fever, sweats, chills, unusual weight change. See history of present illness above for additional pertinent review of systems    Objective:   Physical Exam Vital signs reviewed. GENERAL: Well-developed, well-nourished, no acute distress. CARDIOVASCULAR: Regular rate and rhythm no murmur gallop or rub LUNGS: Clear to auscultation bilaterally, no rales or wheeze. ABDOMEN: Soft positive bowel sounds NEURO: No gross focal neurological deficits. MSK: Movement of extremity x 4.         Assessment & Plan:

## 2014-08-18 NOTE — Assessment & Plan Note (Signed)
Reviewed his blood pressure diary and he is at goal most of the time. He's not having nearly as many episodes of lightheadedness and dizziness. Continue current medication regimen unchanged we will check creatinine today

## 2014-08-18 NOTE — Assessment & Plan Note (Signed)
Symptoms improved on the alternating regimen we have him on now. We'll continue this. Gave him some refills. He's not having any side effects at this time from this current regimen to

## 2014-08-23 ENCOUNTER — Ambulatory Visit (INDEPENDENT_AMBULATORY_CARE_PROVIDER_SITE_OTHER): Payer: Medicare Other | Admitting: Gastroenterology

## 2014-08-23 ENCOUNTER — Encounter: Payer: Self-pay | Admitting: Gastroenterology

## 2014-08-23 VITALS — BP 142/82 | HR 72 | Ht 66.0 in | Wt 199.0 lb

## 2014-08-23 DIAGNOSIS — R1310 Dysphagia, unspecified: Secondary | ICD-10-CM | POA: Insufficient documentation

## 2014-08-23 NOTE — Patient Instructions (Signed)
Your modified barium swallow is scheduled for 08-31-2014 at 1 pm at [place] have been scheduled for a Barium Esophogram at [facility] (1st floor of the hospital) on 08-31-2014 at 130 pm . Please arrive 15 minutes prior to your appointment for registration. Make certain not to have anything to eat or drink 6 hours prior to your test. If you need to reschedule for any reason, please contact radiology at [phone] to do so. __________________________________________________________________ A barium swallow is an examination that concentrates on views of the esophagus. This tends to be a double contrast exam (barium and two liquids which, when combined, create a gas to distend the wall of the oesophagus) or single contrast (non-ionic iodine based). The study is usually tailored to your symptoms so a good history is essential. Attention is paid during the study to the form, structure and configuration of the esophagus, looking for functional disorders (such as aspiration, dysphagia, achalasia, motility and reflux) EXAMINATION You may be asked to change into a gown, depending on the type of swallow being performed. A radiologist and radiographer will perform the procedure. The radiologist will advise you of the type of contrast selected for your procedure and direct you during the exam. You will be asked to stand, sit or lie in several different positions and to hold a small amount of fluid in your mouth before being asked to swallow while the imaging is performed .In some instances you may be asked to swallow barium coated marshmallows to assess the motility of a solid food bolus. The exam can be recorded as a digital or video fluoroscopy procedure. POST PROCEDURE It will take 1-2 days for the barium to pass through your system. To facilitate this, it is important, unless otherwise directed, to increase your fluids for the next 24-48hrs and to resume your normal diet.  This  test typically takes about 30 minutes to perform. __________________________________________________________________________________

## 2014-08-23 NOTE — Progress Notes (Addendum)
     08/23/2014 [name] [id number] 06-13-1920   History of Present Illness:  This is a pleasant 80 year old male who was previously known to Dr. Sharlett Iles. He presents to our office today with his daughter-in-law to discuss his swallowing issues. He had complaints of dysphagia in the past with evaluation, particularly in 2012 and 2013. He underwent EGD in June 2012 at which time he was found to have a hiatal hernia but was otherwise normal. The esophagus was dilated with 56 French Maloney dilator, however, no obvious stricture was seen. He then underwent a manometry study in July 2012, which was essentially normal as well. He then underwent a modified barium swallow study with speech pathology in March 2013.  During his assessment he was diagnosed with mild oral phase dysphagia, moderate pharyngeal phase dysphagia, and mild cervical phase dysphagia.  He was given dietary modifications and maneuvers to help him.  He presents to our office again today with complaints of dysphagia.  Says that food intermittently gets stuck in his throat and he coughs up chunks of food.  Does not occur every time that he eats.  No problems swallowing liquids.  His daughter-in-law says that he not longer follows those instructions that he was given and that he does tend to eat quickly.  He also reports that he has dry mouth, which likely contributes to his issue.     Current Medications, Allergies, Past Medical History, Past Surgical History, Family History and Social History were reviewed in [employer] record.   Physical Exam: BP 142/82  Pulse 72  Ht 5\' 6"  (1.676 m)  Wt 199 lb (90.266 kg)  BMI 32.13 kg/m2 General: Well developed white male in no acute distress Head: Normocephalic and atraumatic Eyes:  Sclerae anicteric, conjunctiva pink  Ears: Normal auditory acuity Lungs: Clear throughout to auscultation Heart: Regular rate and rhythm Abdomen: Soft, non-distended.  Normal bowel  sounds.  Non-tender. Musculoskeletal: Symmetrical with no gross deformities  Extremities: No edema  Neurological: Alert oriented x 4, grossly non-focal Psychological:  Alert and cooperative. Normal mood and affect  Assessment and Recommendations: -Dysphagia:  Previously diagnosed with mild oral phase dysphagia, moderate pharyngeal phase dysphagia, and mild cervical phase dysphagia via MBSS by speech pathology in 01/2012 after normal EGD and esophageal manometry.  He was given dietary modifications and maneuvers at that time.  I suspect that he still remains with the same issues, although may be changed or worsened from previously.  We will check a barium esophagram to reassess the esophagus for structural issues, but will also repeat MBSS with speech pathology so they can reassess and review those strategies with him again (his daughter-in-law says that he eats very quickly and has not been following any of the previously discussed recommendations).  Addendum: Reviewed and agree with initial management. Agree OP dysphagia likely remains most likely, though presbyesophagus is certainly possible  Jerene Bears, MD

## 2014-08-24 ENCOUNTER — Other Ambulatory Visit (HOSPITAL_COMMUNITY): Payer: Self-pay | Admitting: Gastroenterology

## 2014-08-24 DIAGNOSIS — R1310 Dysphagia, unspecified: Secondary | ICD-10-CM

## 2014-08-28 ENCOUNTER — Ambulatory Visit (INDEPENDENT_AMBULATORY_CARE_PROVIDER_SITE_OTHER): Payer: Medicare Other | Admitting: *Deleted

## 2014-08-28 ENCOUNTER — Ambulatory Visit: Payer: Medicare Other

## 2014-08-28 DIAGNOSIS — D518 Other vitamin B12 deficiency anemias: Secondary | ICD-10-CM

## 2014-08-28 MED ORDER — CYANOCOBALAMIN 1000 MCG/ML IJ SOLN
1000.0000 ug | Freq: Once | INTRAMUSCULAR | Status: AC
  Administered 2014-08-28: 1000 ug via INTRAMUSCULAR

## 2014-08-28 NOTE — Progress Notes (Signed)
   Pt in nurse clinic for Vitamin B-12 injection.  Injection given Left deltoid.  Pt denies any symptoms today.  Pt to call for next appt.  Derl Barrow, RN

## 2014-08-31 ENCOUNTER — Ambulatory Visit: Payer: Medicare Other | Admitting: *Deleted

## 2014-08-31 ENCOUNTER — Ambulatory Visit (HOSPITAL_COMMUNITY)
Admission: RE | Admit: 2014-08-31 | Discharge: 2014-08-31 | Disposition: A | Discharge status: Home / Self Care | Payer: Medicare Other | Source: Ambulatory Visit | Attending: Gastroenterology | Admitting: Gastroenterology

## 2014-08-31 DIAGNOSIS — I4891 Unspecified atrial fibrillation: Secondary | ICD-10-CM | POA: Diagnosis not present

## 2014-08-31 DIAGNOSIS — E119 Type 2 diabetes mellitus without complications: Secondary | ICD-10-CM | POA: Insufficient documentation

## 2014-08-31 DIAGNOSIS — N183 Chronic kidney disease, stage 3 (moderate): Secondary | ICD-10-CM | POA: Diagnosis not present

## 2014-08-31 DIAGNOSIS — D649 Anemia, unspecified: Secondary | ICD-10-CM | POA: Diagnosis not present

## 2014-08-31 DIAGNOSIS — Z950 Presence of cardiac pacemaker: Secondary | ICD-10-CM | POA: Diagnosis not present

## 2014-08-31 DIAGNOSIS — M109 Gout, unspecified: Secondary | ICD-10-CM | POA: Insufficient documentation

## 2014-08-31 DIAGNOSIS — K5730 Diverticulosis of large intestine without perforation or abscess without bleeding: Secondary | ICD-10-CM | POA: Diagnosis not present

## 2014-08-31 DIAGNOSIS — I129 Hypertensive chronic kidney disease with stage 1 through stage 4 chronic kidney disease, or unspecified chronic kidney disease: Secondary | ICD-10-CM | POA: Insufficient documentation

## 2014-08-31 DIAGNOSIS — E538 Deficiency of other specified B group vitamins: Secondary | ICD-10-CM | POA: Insufficient documentation

## 2014-08-31 DIAGNOSIS — R1310 Dysphagia, unspecified: Secondary | ICD-10-CM

## 2014-08-31 DIAGNOSIS — K449 Diaphragmatic hernia without obstruction or gangrene: Secondary | ICD-10-CM | POA: Insufficient documentation

## 2014-08-31 NOTE — Procedures (Signed)
Objective Swallowing Evaluation: Modified Barium Swallowing Study  Patient Details  Name: [name] MRN: [id number] Date of Birth: [birth year]  Today's Date: 08/31/2014 Time: 1310-1336 SLP Time Calculation (min): 26 min  Past Medical History:  Past Medical History  Diagnosis Date  . Overactive bladder   . Arthritis   . Diverticulosis of colon (without mention of hemorrhage)   . Gout   . Chronic kidney disease, stage III (moderate)   . Family history of malignant neoplasm of gastrointestinal tract   . Stricture and stenosis of esophagus   . Acute gastritis without mention of hemorrhage   . Unspecified cataract   . Hypertrophy of prostate with urinary obstruction and other lower urinary tract symptoms (LUTS)   . Personal history of colonic polyps 10/21/2006    hyperplastic   . Anemia   . Vitamin B12 deficiency   . Hiatal hernia   . Type II or unspecified type diabetes mellitus without mention of complication, not stated as uncontrolled     pt states that he checks his blood sugar once weekly, is not on oral meds or insulin  . Atrial fibrillation   . Sinus node dysfunction   . Presence of permanent cardiac pacemaker 10/14/05    medtronic Adapta  . Hypertension    Past Surgical History:  Past Surgical History  Procedure Laterality Date  . Appendectomy    . Tonsillectomy    . Pacemaker insertion    . Total hip arthroplasty    . Cardioversion  10/01/2011    Procedure: CARDIOVERSION;  Surgeon: Dani Gobble [name];  Location: [facility] OR;  Service: Cardiovascular;  Laterality: N/A;  OP CARDIOVERSION  . Cardioversion  11/02/12    successful   HPI:  80 year old male to [facility] for OP MBS with esophagram to follow.  Pt c/o food sticking (points to hyoid) and states he sometimes coughs up "chunks of cold food."  PMH:  Esophageal stricture with dilitation x2.  Last dilitation was "years ago." Also has a h/o hiatal hernia, GERD, voice changes, chronic dry mouth, and Type 2 DM.     Assessment  / Plan / Recommendation Clinical Impression  Dysphagia Diagnosis: Suspected primary esophageal dysphagia Clinical impression: Patient presents with symptoms consistent with a primary esophageal dysphagia, with known h/o esophageal stricture and hiatal hernia.  The oropharyngeal phases of the swallow were essential within normal limits, especially considering patient's age.  There was one incidence of flash/transient penetration with cup sip of thin liquid, but all other swallows of thin were normal (including swallows of thin via straw).  The pt actually swallowed the barium tablet prior to drinking any liquid, and the pill lodged in the valleculae, but cleared with subsequent swallows of applesauce and use of chin tuck head position. Radiologist was present for this study and results were reviewed and concurred.  Please see Esophagram results per Radiologist.  Note: Pt's daughter stated (at the end of the study) that the pt takes large bites and does not chew his food thoroughly before swallowing.  Pt and daughter were educated to aspiration and reflux precautions.    Treatment Recommendation  No treatment recommended at this time    Diet Recommendation Dysphagia 3 (Mechanical Soft);Thin liquid   Liquid Administration via: Cup;Straw Medication Administration: Whole meds with liquid Supervision: Patient able to self feed;Intermittent supervision to cue for compensatory strategies Compensations: Slow rate;Small sips/bites;Follow solids with liquid Postural Changes and/or Swallow Maneuvers: Out of bed for meals;Upright 30-60 min after meal    Other  Recommendations Oral Care Recommendations: Oral care BID;Patient independent with oral care Other Recommendations: Clarify dietary restrictions   Follow Up Recommendations  None    Frequency and Duration        Pertinent Vitals/Pain  LS clear per MD note         General HPI: 80 year old male to [facility] for OP MBS with esophagram to follow.  Pt c/o  food sticking (points to hyoid) and states he sometimes coughs up "chunks of cold food."  PMH:  Esophageal stricture with dilitation x2.  Last dilitation was "years ago." Also has a h/o hiatal hernia, GERD, voice changes, chronic dry mouth, and Type 2 DM. Type of Study: Modified Barium Swallowing Study Reason for Referral: Objectively evaluate swallowing function Previous Swallow Assessment: MBS 02/06/12, reults not available in EPIC. Diet Prior to this Study: Regular;Thin liquids Temperature Spikes Noted: No Respiratory Status: Room air (Lung sounds are clear per MD note from last visit.) History of Recent Intubation: No Behavior/Cognition: Alert;Cooperative;Pleasant mood;Requires cueing;Hard of hearing (wears bilateral hearing aids) Oral Cavity - Dentition: Adequate natural dentition Oral Motor / Sensory Function: Within functional limits Self-Feeding Abilities: Able to feed self Patient Positioning: Upright in chair Baseline Vocal Quality: Clear Volitional Cough: Strong Volitional Swallow: Able to elicit Anatomy: Within functional limits Pharyngeal Secretions: Not observed secondary MBS    Reason for Referral Objectively evaluate swallowing function   Oral Phase Oral Preparation/Oral Phase Oral Phase: WFL   Pharyngeal Phase Pharyngeal Phase Pharyngeal Phase: Impaired Pharyngeal - Nectar Pharyngeal - Nectar Cup: Penetration/Aspiration during swallow Penetration/Aspiration details (nectar cup): Material enters airway, remains ABOVE vocal cords then ejected out Pharyngeal - Solids Pharyngeal - Pill: Pharyngeal residue - valleculae  Cervical Esophageal Phase    GO    Cervical Esophageal Phase Cervical Esophageal Phase: [facility] Cervical Esophageal Phase - Comment Cervical Esophageal Comment: Esophageal sweep revealed stasis with the pill and puree.  Please see results of Esophagram to follow.    Functional Assessment Tool Used: Clinical judgement, MBS Functional Limitations:  Swallowing Swallow Current Status ([id number]): At least 1 percent but less than 20 percent impaired, limited or restricted Swallow Goal Status [phone]): At least 1 percent but less than 20 percent impaired, limited or restricted Swallow Discharge Status [phone]): At least 1 percent but less than 20 percent impaired, limited or restricted    Quinn Axe T 08/31/2014, 2:26 PM

## 2014-09-07 ENCOUNTER — Ambulatory Visit (INDEPENDENT_AMBULATORY_CARE_PROVIDER_SITE_OTHER): Payer: Medicare Other | Admitting: Gastroenterology

## 2014-09-07 ENCOUNTER — Telehealth: Payer: Self-pay | Admitting: *Deleted

## 2014-09-07 ENCOUNTER — Encounter: Payer: Self-pay | Admitting: Gastroenterology

## 2014-09-07 VITALS — BP 158/80 | HR 80 | Ht 65.0 in | Wt 192.2 lb

## 2014-09-07 DIAGNOSIS — K222 Esophageal obstruction: Secondary | ICD-10-CM

## 2014-09-07 DIAGNOSIS — R933 Abnormal findings on diagnostic imaging of other parts of digestive tract: Secondary | ICD-10-CM

## 2014-09-07 DIAGNOSIS — R1314 Dysphagia, pharyngoesophageal phase: Secondary | ICD-10-CM | POA: Insufficient documentation

## 2014-09-07 NOTE — Telephone Encounter (Signed)
Called patients daughter in law [place], okay to talk to her per patients chart because patient cannot hear well Advised [name] that Sigmon will need to hold Xarelto 48 hrs prior to exam [name] verbalized understanding

## 2014-09-07 NOTE — Telephone Encounter (Signed)
09/07/2014   RE: ZAFIR SCHAUER DOB: 07-13-1920 MRN: [id number]   Dear Dr.[name],    We have scheduled the above patient for an endoscopic procedure. Our records show that he is on anticoagulation therapy.   Please advise as to how long the patient may come off his therapy of Xarelto prior to the procedure, which is scheduled for 09-22-2014.  Please route the completed form to Evette Georges., CMA  Sincerely,    Hope Pigeon

## 2014-09-07 NOTE — Patient Instructions (Signed)
You have been scheduled for an endoscopy at [facility] with Dr. Hilarie Fredrickson. Please follow written instructions given to you at your visit today. If you use inhalers (even only as needed), please bring them with you on the day of your procedure. This web site gives a general overview about your procedure. However, you should still follow specific instructions given to you by our office regarding your preparation for the procedure.  You will be contacted by our office prior to your procedure for directions on holding your Xarelto. If you do not hear from our office 1 week prior to your scheduled procedure, please call [phone] to discuss.

## 2014-09-07 NOTE — Addendum Note (Signed)
Addended by: Hope Pigeon A on: 09/07/2014 03:34 PM   Modules accepted: Orders

## 2014-09-07 NOTE — Telephone Encounter (Signed)
Please stop Xarelto 48 hours before the procedure. Resume immediately if there is no biopsy/polypectomy or bleeding complication. Resume Xarelto 7-10 days after biopsy/plypectomy or per gastroenterologist's recommendation Sanda Klein, MD, [facility] [phone] office [phone] pager

## 2014-09-07 NOTE — Progress Notes (Addendum)
     09/07/2014 Ines P Icenogle [id number] 12/27/1919   History of Present Illness:  This is a pleasant 80-year-old male who was seen by myself on October 7 for complaints of dysphasia. Please see my note from that date for further details regarding his symptoms. At that time we decided to proceed with esophagram and modified barium swallowing study for evaluation of his symptoms. Esophagram showed a smooth distal esophageal narrowing favoring benign stricture, penetration of thick liquid contrast to the level of the vocal cords worse than was previously observed questioning swallowing fatigue, prominent cervical spondylosis, and nonspecific esophageal dysmotility disorder. 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 It was recommended he take a dysphagia 3 diet with thin liquids. They stated that his oropharyngeal phases were essentially within normal limits considering his age.  He is here with his DIL today again to discuss the results and possible EGD with dilation.   Current Medications, Allergies, Past Medical History, Past Surgical History, Family History and Social History were reviewed in [facility] electronic medical record.   Physical Exam: BP 158/80  Pulse 80  Ht 5' 5" (1.651 m)  Wt 192 lb 4 oz (87.204 kg)  BMI 31.99 kg/m2 General: Well developed white male in no acute distress Head: Normocephalic and atraumatic Eyes:  Sclerae anicteric, conjunctiva pink  Ears: Normal auditory acuity Lungs: Clear throughout to auscultation Heart: Regular rate and rhythm Abdomen: Soft, non-distended.  Normal bowel sounds.  Non-tender. Musculoskeletal: Symmetrical with no gross deformities  Extremities: No edema  Neurological: Alert oriented x 4, grossly non-focal Psychological:  Alert and cooperative. Normal mood and affect  Assessment and Recommendations: -Dysphagia:  Strongly suspect that his dysphagia is multifactorial. Esophagram shows nonspecific  esophageal motility disorder and also indicates swallowing fatigue, but also shows a smooth distal esophageal narrowing favoring benign stricture. We brought the patient in today with his daughter-in-law to discuss the risks and benefits of endoscopy with dilation. We have explained to the patient and his daughter-in-law that EGD with dilation likely will not improve his symptoms 100%, but may improve them somewhat. We have reviewed risks of the procedure including perforation, as well as risks of sedation given his advanced age. He is also on Xarelto, which will need to be held and we will get that approved by his cardiologist, Dr. [name].  Risks of holding theXarelto including risk of stroke were also reviewed with the patient. He agrees to proceed with EGD and possible dilation. This will be scheduled Dr. Pyrtle at [place] [facility].  Addendum: Reviewed and agree with initial management. I also met with the patient and his daughter to explain the risks of endoscopy. After a thorough discussion, he agrees to proceed. This is all assuming okay to hold anticoagulant per cardiology. Dilation may improve dysphagia somewhat, though likely multifactorial.  He understands this. Jay M Pyrtle, MD   

## 2014-09-12 ENCOUNTER — Telehealth: Payer: Self-pay | Admitting: *Deleted

## 2014-09-12 NOTE — Telephone Encounter (Signed)
Signed perioperative prescription for implanted cardiac device programming faxed.

## 2014-09-13 ENCOUNTER — Encounter (HOSPITAL_COMMUNITY): Payer: Self-pay | Admitting: *Deleted

## 2014-09-15 ENCOUNTER — Ambulatory Visit (INDEPENDENT_AMBULATORY_CARE_PROVIDER_SITE_OTHER): Payer: Medicare Other | Admitting: Family Medicine

## 2014-09-15 ENCOUNTER — Encounter: Payer: Self-pay | Admitting: Family Medicine

## 2014-09-15 ENCOUNTER — Encounter (HOSPITAL_COMMUNITY): Payer: Self-pay | Admitting: Pharmacy Technician

## 2014-09-15 VITALS — BP 167/78 | HR 99 | Temp 98.1°F | Wt 191.0 lb

## 2014-09-15 DIAGNOSIS — N3000 Acute cystitis without hematuria: Secondary | ICD-10-CM

## 2014-09-15 DIAGNOSIS — R300 Dysuria: Secondary | ICD-10-CM

## 2014-09-15 DIAGNOSIS — N390 Urinary tract infection, site not specified: Secondary | ICD-10-CM | POA: Insufficient documentation

## 2014-09-15 LAB — POCT URINALYSIS DIPSTICK
BILIRUBIN UA: NEGATIVE
Glucose, UA: NEGATIVE
Ketones, UA: NEGATIVE
NITRITE UA: NEGATIVE
Protein, UA: NEGATIVE
Spec Grav, UA: 1.02
Urobilinogen, UA: 0.2
pH, UA: 6.5

## 2014-09-15 LAB — POCT UA - MICROSCOPIC ONLY

## 2014-09-15 MED ORDER — TRAMADOL HCL 50 MG PO TABS
ORAL_TABLET | ORAL | Status: DC
Start: 2014-09-15 — End: 2014-09-15

## 2014-09-15 MED ORDER — CIPROFLOXACIN HCL 500 MG PO TABS: 500.0000 mg | ORAL_TABLET | Freq: Two times a day (BID) | ORAL | Status: DC

## 2014-09-15 NOTE — Addendum Note (Signed)
Addended by: Martinique, Jonty Morrical on: 09/15/2014 10:28 AM   Modules accepted: Orders

## 2014-09-15 NOTE — Assessment & Plan Note (Signed)
Acute cystitis by symptoms and UA Complicated as he is male Appears nontoxic, tolerating by mouth's well Treatment Cipro twice a day 10 days With increasing symptoms of BPH, I encouraged him to call his urologist for close follow-up

## 2014-09-15 NOTE — Progress Notes (Signed)
Patient ID: [name], male   DOB: August 16, 1920, 80 y.o.   MRN: [id number]   HPI  Patient presents today for dysuria  Patient states that for about 1 week now he's had slowly worsening symptoms of burning when he urinates. He has a history of BPH and has had worsening polyuria and decreased flow lately. He urinates several times a day and cannot estimate the number of times he appears.  He states that he's had chills for several weeks but no overt fever this week. He's had a decrease in appetite for several weeks but this has not worsened over the last week. He is tolerating by mouth food and fluids well.  He follows with urology for BPH. He is compliant with his meds  He requests a refill for tramadol for hip pain today.  Smoking status noted ROS: Per HPI  Objective: BP 167/78  Pulse 99  Temp(Src) 98.1 F (36.7 C) (Oral)  Wt 191 lb (86.637 kg) Gen: NAD, alert, cooperative with exam HEENT: NCAT CV: RRR, good S1/S2, no murmur Resp: CTABL, no wheezes, non-labored Ext: No edema, warm Neuro: Alert and oriented, No gross deficits Rectal: Prostate large and normal consistency without nodules, nontender to palpation  Assessment and plan:  UTI (urinary tract infection) Acute cystitis by symptoms and UA Complicated as he is male Appears nontoxic, tolerating by mouth's well Treatment Cipro twice a day 10 days With increasing symptoms of BPH, I encouraged him to call his urologist for close follow-up   Orders Placed This Encounter  Procedures  . Urinalysis Dipstick  . POCT UA - Microscopic Only    Meds ordered this encounter  Medications  . ciprofloxacin (CIPRO) 500 MG tablet    Sig: Take 1 tablet (500 mg total) by mouth 2 (two) times daily.    Dispense:  20 tablet    Refill:  0  . traMADol (ULTRAM) 50 MG tablet    Sig: Take one PO q 8 prn pain    Dispense:  90 tablet    Refill:  0

## 2014-09-15 NOTE — Patient Instructions (Signed)
Great to meet you today  I have sent an antibiotic, ciprofloxacin, to treat her urinary tract infection  Please get medical help right away or return to the clinic if you have worsening symptoms, or if you're not tolerating food or fluids by mouth.  Please call your urologist today for an appointment in the next few weeks to address her worsening urinary symptoms    Urinary Tract Infection Urinary tract infections (UTIs) can develop anywhere along your urinary tract. Your urinary tract is your body's drainage system for removing wastes and extra water. Your urinary tract includes two kidneys, two ureters, a bladder, and a urethra. Your kidneys are a pair of bean-shaped organs. Each kidney is about the size of your fist. They are located below your ribs, one on each side of your spine. CAUSES Infections are caused by microbes, which are microscopic organisms, including fungi, viruses, and bacteria. These organisms are so small that they can only be seen through a microscope. Bacteria are the microbes that most commonly cause UTIs. SYMPTOMS  Symptoms of UTIs may vary by age and gender of the patient and by the location of the infection. Symptoms in young women typically include a frequent and intense urge to urinate and a painful, burning feeling in the bladder or urethra during urination. Older women and men are more likely to be tired, shaky, and weak and have muscle aches and abdominal pain. A fever may mean the infection is in your kidneys. Other symptoms of a kidney infection include pain in your back or sides below the ribs, nausea, and vomiting. DIAGNOSIS To diagnose a UTI, your caregiver will ask you about your symptoms. Your caregiver also will ask to provide a urine sample. The urine sample will be tested for bacteria and white blood cells. White blood cells are made by your body to help fight infection. TREATMENT  Typically, UTIs can be treated with medication. Because most UTIs are  caused by a bacterial infection, they usually can be treated with the use of antibiotics. The choice of antibiotic and length of treatment depend on your symptoms and the type of bacteria causing your infection. HOME CARE INSTRUCTIONS  If you were prescribed antibiotics, take them exactly as your caregiver instructs you. Finish the medication even if you feel better after you have only taken some of the medication.  Drink enough water and fluids to keep your urine clear or pale yellow.  Avoid caffeine, tea, and carbonated beverages. They tend to irritate your bladder.  Empty your bladder often. Avoid holding urine for long periods of time.  Empty your bladder before and after sexual intercourse.  After a bowel movement, women should cleanse from front to back. Use each tissue only once. SEEK MEDICAL CARE IF:   You have back pain.  You develop a fever.  Your symptoms do not begin to resolve within 3 days. SEEK IMMEDIATE MEDICAL CARE IF:   You have severe back pain or lower abdominal pain.  You develop chills.  You have nausea or vomiting.  You have continued burning or discomfort with urination. MAKE SURE YOU:   Understand these instructions.  Will watch your condition.  Will get help right away if you are not doing well or get worse. Document Released: 08/13/2005 Document Revised: 05/04/2012 Document Reviewed: 12/12/2011 [facility] Patient Information 2015 [place], [place]. This information is not intended to replace advice given to you by your health care provider. Make sure you discuss any questions you have with your health care  provider.

## 2014-09-17 LAB — URINE CULTURE: Colony Count: >=100000

## 2014-09-20 ENCOUNTER — Telehealth: Payer: Self-pay | Admitting: Cardiovascular Disease

## 2014-09-20 LAB — MDC_IDC_ENUM_SESS_TYPE_REMOTE
Brady Statistic AP VP Percent: 99.9 %
Brady Statistic AP VS Percent: 0.1 %
Brady Statistic AS VP Percent: 0.1 %
Brady Statistic AS VS Percent: 0.1 %
Lead Channel Pacing Threshold Amplitude: 0.625 V
Lead Channel Setting Pacing Amplitude: 2 V
Lead Channel Setting Pacing Amplitude: 2.5 V
Lead Channel Setting Pacing Pulse Width: 0.4 ms
Lead Channel Setting Sensing Sensitivity: 4 mV
MDC IDC MSMT LEADCHNL RA IMPEDANCE VALUE: 548 Ohm
MDC IDC MSMT LEADCHNL RV IMPEDANCE VALUE: 646 Ohm
MDC IDC MSMT LEADCHNL RV PACING THRESHOLD PULSEWIDTH: 0.4 ms

## 2014-09-22 ENCOUNTER — Ambulatory Visit (HOSPITAL_COMMUNITY): Payer: Medicare Other | Admitting: Anesthesiology

## 2014-09-22 ENCOUNTER — Encounter (HOSPITAL_COMMUNITY): Payer: Self-pay | Admitting: Anesthesiology

## 2014-09-22 ENCOUNTER — Encounter (HOSPITAL_COMMUNITY)
Admission: RE | Disposition: A | Discharge status: Home / Self Care | Payer: Self-pay | Source: Ambulatory Visit | Attending: Internal Medicine

## 2014-09-22 ENCOUNTER — Ambulatory Visit (HOSPITAL_COMMUNITY)
Admission: RE | Admit: 2014-09-22 | Discharge: 2014-09-22 | Disposition: A | Discharge status: Home / Self Care | Payer: Medicare Other | Source: Ambulatory Visit | Attending: Internal Medicine | Admitting: Internal Medicine

## 2014-09-22 DIAGNOSIS — Z885 Allergy status to narcotic agent status: Secondary | ICD-10-CM | POA: Diagnosis not present

## 2014-09-22 DIAGNOSIS — Z6832 Body mass index (BMI) 32.0-32.9, adult: Secondary | ICD-10-CM | POA: Insufficient documentation

## 2014-09-22 DIAGNOSIS — R1310 Dysphagia, unspecified: Secondary | ICD-10-CM

## 2014-09-22 DIAGNOSIS — E119 Type 2 diabetes mellitus without complications: Secondary | ICD-10-CM | POA: Insufficient documentation

## 2014-09-22 DIAGNOSIS — R1314 Dysphagia, pharyngoesophageal phase: Secondary | ICD-10-CM

## 2014-09-22 DIAGNOSIS — Z87891 Personal history of nicotine dependence: Secondary | ICD-10-CM | POA: Diagnosis not present

## 2014-09-22 DIAGNOSIS — K449 Diaphragmatic hernia without obstruction or gangrene: Secondary | ICD-10-CM | POA: Insufficient documentation

## 2014-09-22 DIAGNOSIS — Z950 Presence of cardiac pacemaker: Secondary | ICD-10-CM | POA: Diagnosis not present

## 2014-09-22 DIAGNOSIS — K222 Esophageal obstruction: Secondary | ICD-10-CM | POA: Diagnosis not present

## 2014-09-22 DIAGNOSIS — M1990 Unspecified osteoarthritis, unspecified site: Secondary | ICD-10-CM | POA: Diagnosis not present

## 2014-09-22 DIAGNOSIS — M47892 Other spondylosis, cervical region: Secondary | ICD-10-CM | POA: Insufficient documentation

## 2014-09-22 DIAGNOSIS — K219 Gastro-esophageal reflux disease without esophagitis: Secondary | ICD-10-CM | POA: Diagnosis not present

## 2014-09-22 DIAGNOSIS — D649 Anemia, unspecified: Secondary | ICD-10-CM | POA: Insufficient documentation

## 2014-09-22 DIAGNOSIS — Z7901 Long term (current) use of anticoagulants: Secondary | ICD-10-CM | POA: Insufficient documentation

## 2014-09-22 DIAGNOSIS — R933 Abnormal findings on diagnostic imaging of other parts of digestive tract: Secondary | ICD-10-CM

## 2014-09-22 DIAGNOSIS — E669 Obesity, unspecified: Secondary | ICD-10-CM | POA: Insufficient documentation

## 2014-09-22 DIAGNOSIS — I10 Essential (primary) hypertension: Secondary | ICD-10-CM | POA: Diagnosis not present

## 2014-09-22 HISTORY — PX: ESOPHAGOGASTRODUODENOSCOPY (EGD) WITH PROPOFOL: SHX5813

## 2014-09-22 HISTORY — PX: SAVORY DILATION: SHX5439

## 2014-09-22 SURGERY — ESOPHAGOGASTRODUODENOSCOPY (EGD) WITH PROPOFOL
Anesthesia: Monitor Anesthesia Care

## 2014-09-22 MED ORDER — PROPOFOL 10 MG/ML IV BOLUS
INTRAVENOUS | Status: AC
  Filled 2014-09-22: qty 20

## 2014-09-22 MED ORDER — PROPOFOL INFUSION 10 MG/ML OPTIME
INTRAVENOUS | Status: DC | PRN
Start: 2014-09-22 — End: 2014-09-22
  Administered 2014-09-22: 75 ug/kg/min via INTRAVENOUS

## 2014-09-22 MED ORDER — SODIUM CHLORIDE 0.9 % IV SOLN: INTRAVENOUS | Status: DC

## 2014-09-22 MED ORDER — PROPOFOL 10 MG/ML IV BOLUS
INTRAVENOUS | Status: DC | PRN
  Administered 2014-09-22: 10 mg via INTRAVENOUS
  Administered 2014-09-22: 20 mg via INTRAVENOUS

## 2014-09-22 MED ORDER — BUTAMBEN-TETRACAINE-BENZOCAINE 2-2-14 % EX AERO
INHALATION_SPRAY | CUTANEOUS | Status: DC | PRN
  Administered 2014-09-22: 2 via TOPICAL

## 2014-09-22 SURGICAL SUPPLY — 15 items

## 2014-09-22 NOTE — Op Note (Signed)
[facility] [place], [id number]   ENDOSCOPY PROCEDURE REPORT  PATIENT: Samuel, Ford  MR#: #[id number] BIRTHDATE: [birth year] , 31  yrs. old GENDER: male ENDOSCOPIST: Jerene Bears, MD PROCEDURE DATE:  09/22/2014 PROCEDURE:  EGD w/ balloon dilation ASA CLASS:     Class III INDICATIONS:  dysphagia. MEDICATIONS: Monitored anesthesia care and Per Anesthesia TOPICAL ANESTHETIC: Cetacaine Spray  DESCRIPTION OF PROCEDURE: After the risks benefits and alternatives of the procedure were thoroughly explained, informed consent was obtained.  The [place] [id number] endoscope was introduced through the mouth and advanced to the second portion of the duodenum , Without limitations.  The instrument was slowly withdrawn as the mucosa was fully examined.  ESOPHAGUS: The mucosa of the esophagus appeared normal. No definitive stricture was seen at the GE junction. Using a TTS-balloon the stricture was dilated up to 28mm (after first dilating the 15 mm and 16.5 mm with inspection between each dilator size). The balloon was held inflated for 60 seconds.  Following this dilation, there was no mucosal tear though the GE junction appeared slightly more patent  STOMACH: The mucosa of the stomach appeared normal.  DUODENUM: The duodenal mucosa showed no abnormalities in the bulb and 2nd part of the duodenum.  Retroflexed views revealed no abnormalities.     The scope was then withdrawn from the patient and the procedure completed.  COMPLICATIONS: There were no immediate complications.  ENDOSCOPIC IMPRESSION: 1.   The mucosa of the esophagus appeared normal; Using a TTS-balloon the stricture was dilated up to 74mm 2.   The mucosa of the stomach appeared normal 3.   The duodenal mucosa showed no abnormalities in the bulb and 2nd part of the duodenum  RECOMMENDATIONS: 1.  Await response to dilation, any residual dysphagia symptoms are felt secondary to  esophageal dysmotility 2.  Chew food extremely well and take small bites.  Drink liquids periodically during eating 3.  Can resume Xarelto tomorrow at usual dose 4.  Office follow-up in 6-8 weeks  REPEAT EXAM:  eSigned:  Jerene Bears, MD 09/22/2014 9:22 AM    WN:IOEV Nori Riis, MD and The Patient  PATIENT NAME:  Samuel, Ford MR#: #[id number]

## 2014-09-22 NOTE — Transfer of Care (Signed)
Immediate Anesthesia Transfer of Care Note  Patient: [name]  Procedure(s) Performed: Procedure(s) (LRB): ESOPHAGOGASTRODUODENOSCOPY (EGD) WITH PROPOFOL (N/A) SAVORY DILATION (N/A)  Patient Location: PACU  Anesthesia Type: MAC  Level of Consciousness: sedated, patient cooperative and responds to stimulation  Airway & Oxygen Therapy: Patient Spontanous Breathing and Patient connected to face mask oxgen  Post-op Assessment: Report given to PACU RN and Post -op Vital signs reviewed and stable  Post vital signs: Reviewed and stable  Complications: No apparent anesthesia complications

## 2014-09-22 NOTE — H&P (View-Only) (Signed)
     09/07/2014 [name] [id number] 10/01/1920   History of Present Illness:  This is a pleasant 80-year-old male who was seen by myself on October 7 for complaints of dysphasia. Please see my note from that date for further details regarding his symptoms. At that time we decided to proceed with esophagram and modified barium swallowing study for evaluation of his symptoms. Esophagram showed a smooth distal esophageal narrowing favoring benign stricture, penetration of thick liquid contrast to the level of the vocal cords worse than was previously observed questioning swallowing fatigue, prominent cervical spondylosis, and nonspecific esophageal dysmotility disorder. 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 It was recommended he take a dysphagia 3 diet with thin liquids. They stated that his oropharyngeal phases were essentially within normal limits considering his age.  He is here with his DIL today again to discuss the results and possible EGD with dilation.   Current Medications, Allergies, Past Medical History, Past Surgical History, Family History and Social History were reviewed in [facility] electronic medical record.   Physical Exam: BP 158/80  Pulse 80  Ht 5' 5" (1.651 m)  Wt 192 lb 4 oz (87.204 kg)  BMI 31.99 kg/m2 General: Well developed white male in no acute distress Head: Normocephalic and atraumatic Eyes:  Sclerae anicteric, conjunctiva pink  Ears: Normal auditory acuity Lungs: Clear throughout to auscultation Heart: Regular rate and rhythm Abdomen: Soft, non-distended.  Normal bowel sounds.  Non-tender. Musculoskeletal: Symmetrical with no gross deformities  Extremities: No edema  Neurological: Alert oriented x 4, grossly non-focal Psychological:  Alert and cooperative. Normal mood and affect  Assessment and Recommendations: -Dysphagia:  Strongly suspect that his dysphagia is multifactorial. Esophagram shows nonspecific  esophageal motility disorder and also indicates swallowing fatigue, but also shows a smooth distal esophageal narrowing favoring benign stricture. We brought the patient in today with his daughter-in-law to discuss the risks and benefits of endoscopy with dilation. We have explained to the patient and his daughter-in-law that EGD with dilation likely will not improve his symptoms 100%, but may improve them somewhat. We have reviewed risks of the procedure including perforation, as well as risks of sedation given his advanced age. He is also on Xarelto, which will need to be held and we will get that approved by his cardiologist, Dr. [name].  Risks of holding theXarelto including risk of stroke were also reviewed with the patient. He agrees to proceed with EGD and possible dilation. This will be scheduled Dr. Pyrtle at [place] [facility].  Addendum: Reviewed and agree with initial management. I also met with the patient and his daughter to explain the risks of endoscopy. After a thorough discussion, he agrees to proceed. This is all assuming okay to hold anticoagulant per cardiology. Dilation may improve dysphagia somewhat, though likely multifactorial.  He understands this. Jay M Pyrtle, MD   

## 2014-09-22 NOTE — Interval H&P Note (Signed)
History and Physical Interval Note: Patient presents today for upper endoscopy for possible dilation. I met the patient on 09/07/2014 in the office. We discussed his dysphagia. It is likely multifactorial but swallow evaluation did show probable benign stricture at the GE junction. He has held his Xarelto and desires to proceed with upper endoscopy for probable dilation.  The nature of the procedure, as well as the risks, benefits, and alternatives were carefully and thoroughly reviewed with the patient. Ample time for discussion and questions allowed. The patient understood, was satisfied, and agreed to proceed.     09/22/2014 8:19 AM  [name]  has presented today for surgery, with the diagnosis of dysphagia  The various methods of treatment have been discussed with the patient and family. After consideration of risks, benefits and other options for treatment, the patient has consented to  Procedure(s): ESOPHAGOGASTRODUODENOSCOPY (EGD) WITH PROPOFOL (N/A) SAVORY DILATION (N/A) as a surgical intervention .  The patient's history has been reviewed, patient examined, no change in status, stable for surgery.  I have reviewed the patient's chart and labs.  Questions were answered to the patient's satisfaction.     PYRTLE, JAY M

## 2014-09-22 NOTE — Anesthesia Postprocedure Evaluation (Signed)
  Anesthesia Post-op Note  Patient: [name]  Procedure(s) Performed: Procedure(s) (LRB): ESOPHAGOGASTRODUODENOSCOPY (EGD) WITH PROPOFOL (N/A) SAVORY DILATION (N/A)  Patient Location: PACU  Anesthesia Type: MAC  Level of Consciousness: awake and alert   Airway and Oxygen Therapy: Patient Spontanous Breathing  Post-op Pain: mild  Post-op Assessment: Post-op Vital signs reviewed, Patient's Cardiovascular Status Stable, Respiratory Function Stable, Patent Airway and No signs of Nausea or vomiting  Last Vitals:  Filed Vitals:   09/22/14 0950  BP: 169/65  Pulse: 80  Temp:   Resp: 15    Post-op Vital Signs: stable   Complications: No apparent anesthesia complications

## 2014-09-22 NOTE — Anesthesia Preprocedure Evaluation (Signed)
Anesthesia Evaluation  Patient identified by MRN, date of birth, ID band Patient awake    Reviewed: Allergy & Precautions, H&P , NPO status , Patient's Chart, lab work & pertinent test results  Airway Mallampati: II  TM Distance: >3 FB Neck ROM: Full    Dental no notable dental hx.    Pulmonary shortness of breath, former smoker,  breath sounds clear to auscultation  Pulmonary exam normal       Cardiovascular hypertension, Pt. on medications and Pt. on home beta blockers + pacemaker Rhythm:Regular Rate:Normal     Neuro/Psych  Neuromuscular disease negative psych ROS   GI/Hepatic Neg liver ROS, hiatal hernia, GERD-  ,  Endo/Other  negative endocrine ROSdiabetes  Renal/GU Renal disease  negative genitourinary   Musculoskeletal  (+) Arthritis -,   Abdominal (+) + obese,   Peds negative pediatric ROS (+)  Hematology  (+) anemia ,   Anesthesia Other Findings   Reproductive/Obstetrics negative OB ROS                             Anesthesia Physical Anesthesia Plan  ASA: III  Anesthesia Plan: MAC   Post-op Pain Management:    Induction: Intravenous  Airway Management Planned:   Additional Equipment:   Intra-op Plan:   Post-operative Plan:   Informed Consent: I have reviewed the patients History and Physical, chart, labs and discussed the procedure including the risks, benefits and alternatives for the proposed anesthesia with the patient or authorized representative who has indicated his/her understanding and acceptance.   Dental advisory given  Plan Discussed with: CRNA  Anesthesia Plan Comments:         Anesthesia Quick Evaluation

## 2014-09-25 ENCOUNTER — Encounter (HOSPITAL_COMMUNITY): Payer: Self-pay | Admitting: Internal Medicine

## 2014-09-25 NOTE — Telephone Encounter (Signed)
Closed encounter °

## 2014-10-02 ENCOUNTER — Ambulatory Visit (INDEPENDENT_AMBULATORY_CARE_PROVIDER_SITE_OTHER): Payer: Medicare Other | Admitting: *Deleted

## 2014-10-02 DIAGNOSIS — D518 Other vitamin B12 deficiency anemias: Secondary | ICD-10-CM

## 2014-10-02 MED ORDER — CYANOCOBALAMIN 1000 MCG/ML IJ SOLN
1000.0000 ug | Freq: Once | INTRAMUSCULAR | Status: AC
  Administered 2014-10-02: 1000 ug via INTRAMUSCULAR

## 2014-10-02 NOTE — Progress Notes (Signed)
Patient in today for B12 shot. Injection given in right deltoid, patient without complaints.

## 2014-10-06 ENCOUNTER — Encounter: Payer: Self-pay | Admitting: Cardiology

## 2014-10-11 ENCOUNTER — Encounter: Payer: Self-pay | Admitting: Cardiovascular Disease

## 2014-10-26 ENCOUNTER — Encounter (HOSPITAL_COMMUNITY): Payer: Self-pay | Admitting: Cardiovascular Disease

## 2014-10-30 ENCOUNTER — Ambulatory Visit (INDEPENDENT_AMBULATORY_CARE_PROVIDER_SITE_OTHER): Payer: Medicare Other | Admitting: *Deleted

## 2014-10-30 DIAGNOSIS — D518 Other vitamin B12 deficiency anemias: Secondary | ICD-10-CM

## 2014-10-30 MED ORDER — CYANOCOBALAMIN 1000 MCG/ML IJ SOLN
1000.0000 ug | Freq: Once | INTRAMUSCULAR | Status: AC
  Administered 2014-10-30: 1000 ug via INTRAMUSCULAR

## 2014-10-30 NOTE — Progress Notes (Signed)
   Pt in nurse clinic for Vitamin B-12 injection.  Injection given right deltoid.  Pt denies any concerns today.  Derl Barrow, RN

## 2014-10-31 ENCOUNTER — Other Ambulatory Visit: Payer: Self-pay

## 2014-11-24 ENCOUNTER — Encounter: Payer: Self-pay | Admitting: *Deleted

## 2014-11-27 ENCOUNTER — Ambulatory Visit (INDEPENDENT_AMBULATORY_CARE_PROVIDER_SITE_OTHER): Payer: Medicare Other | Admitting: *Deleted

## 2014-11-27 DIAGNOSIS — D518 Other vitamin B12 deficiency anemias: Secondary | ICD-10-CM

## 2014-11-27 MED ORDER — CYANOCOBALAMIN 1000 MCG/ML IJ SOLN
1000.0000 ug | Freq: Once | INTRAMUSCULAR | Status: AC
  Administered 2014-11-27: 1000 ug via INTRAMUSCULAR

## 2014-11-27 NOTE — Progress Notes (Signed)
   Pt in nurse clinic for Vitamin B-12 injection.  Cyanocobalamin 1000 mcg/mL given left deltoid.  Pt has follow up appt with PCP 12/06/14.  Derl Barrow, RN

## 2014-11-28 ENCOUNTER — Ambulatory Visit (INDEPENDENT_AMBULATORY_CARE_PROVIDER_SITE_OTHER): Payer: Medicare Other | Admitting: Cardiovascular Disease

## 2014-11-28 ENCOUNTER — Ambulatory Visit (INDEPENDENT_AMBULATORY_CARE_PROVIDER_SITE_OTHER): Payer: Medicare Other | Admitting: Internal Medicine

## 2014-11-28 ENCOUNTER — Encounter: Payer: Self-pay | Admitting: Cardiovascular Disease

## 2014-11-28 ENCOUNTER — Encounter: Payer: Self-pay | Admitting: Internal Medicine

## 2014-11-28 VITALS — BP 136/80 | HR 89 | Ht 66.0 in | Wt 188.0 lb

## 2014-11-28 VITALS — BP 152/82 | HR 92 | Resp 16 | Ht 65.0 in | Wt 189.0 lb

## 2014-11-28 DIAGNOSIS — I441 Atrioventricular block, second degree: Secondary | ICD-10-CM

## 2014-11-28 DIAGNOSIS — K222 Esophageal obstruction: Secondary | ICD-10-CM

## 2014-11-28 DIAGNOSIS — K228 Other specified diseases of esophagus: Secondary | ICD-10-CM

## 2014-11-28 DIAGNOSIS — I4891 Unspecified atrial fibrillation: Secondary | ICD-10-CM

## 2014-11-28 DIAGNOSIS — Z950 Presence of cardiac pacemaker: Secondary | ICD-10-CM

## 2014-11-28 DIAGNOSIS — I10 Essential (primary) hypertension: Secondary | ICD-10-CM

## 2014-11-28 DIAGNOSIS — R001 Bradycardia, unspecified: Secondary | ICD-10-CM

## 2014-11-28 DIAGNOSIS — K219 Gastro-esophageal reflux disease without esophagitis: Secondary | ICD-10-CM

## 2014-11-28 DIAGNOSIS — K2289 Other specified disease of esophagus: Secondary | ICD-10-CM

## 2014-11-28 DIAGNOSIS — R1310 Dysphagia, unspecified: Secondary | ICD-10-CM

## 2014-11-28 DIAGNOSIS — I495 Sick sinus syndrome: Secondary | ICD-10-CM

## 2014-11-28 DIAGNOSIS — K224 Dyskinesia of esophagus: Secondary | ICD-10-CM

## 2014-11-28 LAB — MDC_IDC_ENUM_SESS_TYPE_INCLINIC
Battery Impedance: 112 Ohm
Battery Remaining Longevity: 121 mo
Brady Statistic AP VP Percent: 100 %
Brady Statistic AP VS Percent: 0 %
Brady Statistic AS VP Percent: 0 %
Lead Channel Impedance Value: 572 Ohm
Lead Channel Impedance Value: 629 Ohm
Lead Channel Pacing Threshold Amplitude: 0.5 V
Lead Channel Pacing Threshold Amplitude: 0.75 V
Lead Channel Sensing Intrinsic Amplitude: 2 mV
Lead Channel Setting Pacing Amplitude: 2 V
Lead Channel Setting Pacing Pulse Width: 0.4 ms
MDC IDC MSMT BATTERY VOLTAGE: 2.79 V
MDC IDC MSMT LEADCHNL RA PACING THRESHOLD PULSEWIDTH: 0.4 ms
MDC IDC MSMT LEADCHNL RV PACING THRESHOLD PULSEWIDTH: 0.4 ms
MDC IDC MSMT LEADCHNL RV SENSING INTR AMPL: 15.67 mV
MDC IDC SESS DTM: 20160112172047
MDC IDC SET LEADCHNL RV PACING AMPLITUDE: 2.5 V
MDC IDC SET LEADCHNL RV SENSING SENSITIVITY: 4 mV
MDC IDC STAT BRADY AS VS PERCENT: 0 %

## 2014-11-28 MED ORDER — PANTOPRAZOLE SODIUM 40 MG PO TBEC: 40.0000 mg | DELAYED_RELEASE_TABLET | Freq: Every day | ORAL | Status: DC

## 2014-11-28 MED ORDER — PANTOPRAZOLE SODIUM 40 MG PO TBEC
40.0000 mg | DELAYED_RELEASE_TABLET | Freq: Every day | ORAL | Status: DC
Start: 2014-11-28 — End: 2015-10-03

## 2014-11-28 NOTE — Progress Notes (Signed)
   Subjective:    Patient ID: [name], male    DOB: [birth year], 81 y.o.   MRN: [id number]  HPI Patient is a 81 year old male, here with a family member for EGD follow up The EGD was done for dysphagia and evaluation of abnormal esophogram revealing a smooth distal esophageal narrowing. Endoscopically his esophagus was normal. After empirical balloon dilation he is swallowing much better. No other GI complaints. He continues on a daily PPI. No melena, rectal bleeding.  He reports hip pain, which is chronic.  Good appetite and no early satiety.    Review of Systems As per HPI, otherwise negative  Current Medications, Allergies, Past Medical History, Past Surgical History, Family History and Social History were reviewed in [employer] record.     Objective:   Physical Exam BP 136/80 mmHg  Pulse 89  Ht 5\' 6"  (1.676 m)  Wt 188 lb (85.276 kg)  BMI 30.36 kg/m2  SpO2 96% Constitutional: Well-developed and well-nourished white male in no distress. HEENT: Normocephalic and atraumatic. Conjunctivae are normal.  No scleral icterus. Neck: Neck supple.  Cardiovascular: Normal rate, regular rhythm Pulmonary/chest: Effort normal and breath sounds normal. No wheezing, rales or rhonchi. Abdominal: Soft, nontender, nondistended. Bowel sounds active throughout. There are no masses palpable. No hepatosplenomegaly. Extremities: no clubbing, cyanosis, or edema Lymphadenopathy: No cervical adenopathy noted. Neurological: Alert and oriented to person place and time. Skin: Skin is warm and dry. No rashes noted. Psychiatric: Normal mood and affect. Behavior is normal.  EGD reviewed, dilation to 18 mm     Assessment & Plan:    81 year old male s/p EGD with empirical balloon dilation in November for dysphagia and with esophageal dysmotility not consistent with achalasia. No strictures seen, suspect esophageal dysmotility which seems to respond to dilation (dilated several times  through the years by Dr. Sharlett Iles). Dysphagia has significantly improved. We discussed the importance of eating slowly, taking small bites of food with adequate liquid in between bites. Continue daily PPI. Follow up in one year or sooner if needed

## 2014-11-28 NOTE — Patient Instructions (Addendum)
Follow up in 1 year.  We have sent the following prescriptions to your mail in pharmacy: pantoprazole  If you have not heard from your mail in pharmacy within 1 week or if you have not received your medication in the mail, please contact us at [phone] so we may find out why.  Cc.Dr [name]

## 2014-11-28 NOTE — Patient Instructions (Signed)
Remote monitoring is used to monitor your Pacemaker or ICD from home. This monitoring reduces the number of office visits required to check your device to one time per year. It allows [place] to monitor the functioning of your device to ensure it is working properly. You are scheduled for a device check from home on February 28, 2015. You may send your transmission at any time that day. If you have a wireless device, the transmission will be sent automatically. After your physician reviews your transmission, you will receive a postcard with your next transmission date.  Dr. [name] recommends that you schedule a follow-up appointment in: One Year.

## 2014-11-29 ENCOUNTER — Encounter: Payer: Self-pay | Admitting: Cardiovascular Disease

## 2014-11-29 DIAGNOSIS — I441 Atrioventricular block, second degree: Secondary | ICD-10-CM | POA: Insufficient documentation

## 2014-11-29 HISTORY — DX: Atrioventricular block, second degree: I44.1

## 2014-11-29 NOTE — Progress Notes (Signed)
Patient ID: [name], male   DOB: 1920/04/13, 81 y.o.   MRN: [id number]     Reason for office visit Pacemaker follow up, works is mobile atrial fibrillation, sinus node dysfunction, high-grade second-degree AV block  Now 9 months s/p pacemaker generator change. Generally feels well, rare fleeting palpitations, dyspnea and atypical chest twinges. Pacemaker interrogation does not show interim arhythmia. 100% sequential AV pacing. He is not truly pacemaker dependent with an underlying sinus bradycardia at 37 bpm, but always has paced rhythm. Generator longevity is estimated to be 10 years.  His blood pressure slightly high today but I think he is nervous about Mohs surgery for left hand squamous cell carcinoma scheduled for tomorrow. We discussed holding the Xarelto for 1 night before the surgery.  Rahshawn has a long standing history of sinus node dysfunction (initial pacemaker insertion 2006), subsequently developed high grade AV block and a history of persistent atrial fibrillation which has responded well to dronedarone. He required a cardioversion in 2013. On Xarelto for embolic prevention, no bleeding complications. He has well controlled HTN and mild, diet controlled type 2 DM.   Allergies  Allergen Reactions  . Codeine     REACTION: nausea    Current Outpatient Prescriptions  Medication Sig Dispense Refill  . beta carotene w/minerals (OCUVITE) tablet Take 1 tablet by mouth every morning.     . dronedarone (MULTAQ) 400 MG tablet Take 1 tablet (400 mg total) by mouth 2 (two) times daily with a meal. 180 tablet 3  . nebivolol (BYSTOLIC) 10 MG tablet Take 10 mg by mouth every morning.    Marland Kitchen OVER THE COUNTER MEDICATION Place 1 each into both ears as needed (for allergies.). Ear drops.    . Probiotic Product (PHILLIPS Samuel HEALTH PO) Take 1 tablet by mouth every morning.     . ramipril (ALTACE) 2.5 MG capsule Take 2.5 mg by mouth every morning.    . Rivaroxaban (XARELTO) 15 MG TABS  tablet Take 15 mg by mouth at bedtime.    . silodosin (RAPAFLO) 8 MG CAPS capsule Take 8 mg by mouth daily with breakfast. In p.m    . traMADol (ULTRAM) 50 MG tablet Take 50 mg by mouth every 8 (eight) hours as needed for moderate pain.    . pantoprazole (PROTONIX) 40 MG tablet Take 1 tablet (40 mg total) by mouth daily before breakfast. 30 tablet 10  . pantoprazole (PROTONIX) 40 MG tablet Take 1 tablet (40 mg total) by mouth daily before breakfast. 90 tablet 2   No current facility-administered medications for this visit.   Facility-Administered Medications Ordered in Other Visits  Medication Dose Route Frequency Provider Last Rate Last Dose  . 0.9 %  sodium chloride infusion    Continuous PRN Eligha Bridegroom, CRNA        Past Medical History  Diagnosis Date  . Overactive bladder   . Arthritis   . Diverticulosis of Samuel (without mention of hemorrhage)   . Gout   . Chronic kidney disease, stage III (moderate)   . Family history of malignant neoplasm of gastrointestinal tract   . Stricture and stenosis of esophagus   . Acute gastritis without mention of hemorrhage   . Unspecified cataract   . Hypertrophy of prostate with urinary obstruction and other lower urinary tract symptoms (LUTS)   . Personal history of colonic polyps 10/21/2006    hyperplastic   . Vitamin B12 deficiency   . Hiatal hernia   . Type II or unspecified  type diabetes mellitus without mention of complication, not stated as uncontrolled     pt states that he checks his blood sugar once weekly, is not on oral meds or insulin  . Atrial fibrillation   . Sinus node dysfunction   . Presence of permanent cardiac pacemaker 10/14/05    medtronic Adapta  . Hypertension   . Esophageal dysmotility     Past Surgical History  Procedure Laterality Date  . Appendectomy    . Tonsillectomy    . Pacemaker insertion  replaced 2 months ago, total of 8 years with pacemaker  . Total hip arthroplasty Bilateral   . Cardioversion   10/01/2011    Procedure: CARDIOVERSION;  Surgeon: Dani Gobble Janey Petron;  Location: [facility];  Service: Cardiovascular;  Laterality: N/A;  OP CARDIOVERSION  . Cardioversion  11/02/12    successful  . Eye surgery Bilateral years ago    cataract lens replacment  . Esophagogastroduodenoscopy (egd) with propofol N/A 09/22/2014    Procedure: ESOPHAGOGASTRODUODENOSCOPY (EGD) WITH PROPOFOL;  Surgeon: Jerene Bears, MD;  Location: [facility];  Service: Gastroenterology;  Laterality: N/A;  . Savory dilation N/A 09/22/2014    Procedure: SAVORY DILATION;  Surgeon: Jerene Bears, MD;  Location: [facility];  Service: Gastroenterology;  Laterality: N/A;  . Permanent pacemaker generator change N/A 03/14/2014    Procedure: PERMANENT PACEMAKER GENERATOR CHANGE;  Surgeon: Sanda Klein, MD;  Location: [place] [facility];  Service: Cardiovascular;  Laterality: N/A;    Family History  Problem Relation Age of Onset  . Samuel cancer Brother 36  . Hypertension Brother   . Hypertension Father   . Parkinsonism Mother   . Hodgkin's lymphoma Grandchild     History   Social History  . Marital Status: Widowed    Spouse Name: N/A    Number of Children: N/A  . Years of Education: N/A   Occupational History  . retired    Social History Main Topics  . Smoking status: Former Smoker -- 1.00 packs/day for 20 years    Types: Cigarettes    Quit date: 11/17/1969  . Smokeless tobacco: Never Used  . Alcohol Use: No     Comment: none since 1990  . Drug Use: No  . Sexual Activity: Not on file   Other Topics Concern  . Not on file   Social History Narrative    Review of systems: The patient specifically denies any chest pain at rest or with exertion, dyspnea at rest or with exertion, orthopnea, paroxysmal nocturnal dyspnea, syncope, palpitations, focal neurological deficits, intermittent claudication, lower extremity edema, unexplained weight gain, cough, hemoptysis or wheezing.  The patient also denies abdominal pain,  nausea, vomiting, dysphagia, diarrhea, constipation, polyuria, polydipsia, dysuria, hematuria, frequency, urgency, abnormal bleeding or bruising, fever, chills, unexpected weight changes, mood swings, change in skin or hair texture, change in voice quality, auditory or visual problems, allergic reactions or rashes, new musculoskeletal complaints other than usual "aches and pains".   PHYSICAL EXAM BP 152/82 mmHg  Pulse 92  Resp 16  Ht 5\' 5"  (1.651 m)  Wt 189 lb (85.73 kg)  BMI 31.45 kg/m2 General: Alert, oriented x3, no distress  Head: no evidence of trauma, PERRL, EOMI, no exophtalmos or lid lag, no myxedema, no xanthelasma; normal ears, nose and oropharynx  Neck: normal jugular venous pulsations and no hepatojugular reflux; brisk carotid pulses without delay and no carotid bruits  Chest: clear to auscultation, no signs of consolidation by percussion or palpation, normal fremitus, symmetrical and full respiratory excursions  Cardiovascular: normal position and quality of the apical impulse, regular rhythm, normal first and second heart sounds, no murmurs, rubs or gallops  Abdomen: no tenderness or distention, no masses by palpation, no abnormal pulsatility or arterial bruits, normal bowel sounds, no hepatosplenomegaly  Extremities: no clubbing, cyanosis or edema; 2+ radial, ulnar and brachial pulses bilaterally; 2+ right femoral, posterior tibial and dorsalis pedis pulses; 2+ left femoral, posterior tibial and dorsalis pedis pulses; no subclavian or femoral bruits  Neurological: grossly nonfocal   EKG: AV sequential pacing   Lipid Panel     Component Value Date/Time   CHOL 152 07/05/2008 2038   TRIG 315* 07/05/2008 2038   HDL 28* 07/05/2008 2038   CHOLHDL 5.4 Ratio 07/05/2008 2038   VLDL 63* 07/05/2008 2038   LDLCALC 61 07/05/2008 2038   LDLDIRECT 91 12/26/2009 2015    BMET    Component Value Date/Time   NA 135 08/16/2014 1140   K 5.2 08/16/2014 1140   CL 99 08/16/2014  1140   CO2 20 08/16/2014 1140   GLUCOSE 106* 08/16/2014 1140   BUN 21 08/16/2014 1140   CREATININE 1.45* 08/16/2014 1140   CREATININE 1.45* 12/30/2013 0316   CALCIUM 9.1 08/16/2014 1140   GFRNONAA 41* 12/30/2013 0316   GFRAA 47* 12/30/2013 0316     ASSESSMENT AND PLAN  Paroxysmal atrial fibrillation No recent arrhythmia recurrence by pacemaker check on appropriate anticoagulation. Can hold Xarelto for 24 hours before small surgical procedures. Continue Multaq. No evidence of heart failure or other antiarrhythmic-related complications. Reminded that he has to take it with meals.  Sinus bradycardia and high-grade second-degree AV block that is post dual-chamber permanent pacemaker Normal pacemaker function. 100% pacing in both the atrium and the ventricle.   Orders Placed This Encounter  Procedures  . Implantable device check  . EKG 12-Lead   No orders of the defined types were placed in this encounter.    Samuel Humbles, MD, [place] [phone] office [phone] pager

## 2014-12-06 ENCOUNTER — Encounter: Payer: Self-pay | Admitting: Cardiovascular Disease

## 2014-12-06 ENCOUNTER — Ambulatory Visit (INDEPENDENT_AMBULATORY_CARE_PROVIDER_SITE_OTHER): Payer: Medicare Other | Admitting: Family Medicine

## 2014-12-06 ENCOUNTER — Telehealth: Payer: Self-pay | Admitting: Family Medicine

## 2014-12-06 ENCOUNTER — Encounter: Payer: Self-pay | Admitting: Family Medicine

## 2014-12-06 VITALS — BP 120/63 | HR 88 | Temp 98.3°F | Ht 65.0 in | Wt 183.0 lb

## 2014-12-06 DIAGNOSIS — K222 Esophageal obstruction: Secondary | ICD-10-CM

## 2014-12-06 DIAGNOSIS — G629 Polyneuropathy, unspecified: Secondary | ICD-10-CM

## 2014-12-06 DIAGNOSIS — N183 Chronic kidney disease, stage 3 unspecified: Secondary | ICD-10-CM

## 2014-12-06 DIAGNOSIS — N401 Enlarged prostate with lower urinary tract symptoms: Secondary | ICD-10-CM

## 2014-12-06 DIAGNOSIS — R3914 Feeling of incomplete bladder emptying: Secondary | ICD-10-CM

## 2014-12-06 DIAGNOSIS — I10 Essential (primary) hypertension: Secondary | ICD-10-CM

## 2014-12-06 DIAGNOSIS — R739 Hyperglycemia, unspecified: Secondary | ICD-10-CM

## 2014-12-06 LAB — COMPREHENSIVE METABOLIC PANEL
ALBUMIN: 4.1 g/dL (ref 3.5–5.2)
ALK PHOS: 49 U/L (ref 39–117)
ALT: 10 U/L (ref 0–53)
AST: 15 U/L (ref 0–37)
BUN: 24 mg/dL — ABNORMAL HIGH (ref 6–23)
CHLORIDE: 97 meq/L (ref 96–112)
CO2: 25 meq/L (ref 19–32)
CREATININE: 1.33 mg/dL (ref 0.50–1.35)
Calcium: 9.1 mg/dL (ref 8.4–10.5)
Glucose, Bld: 94 mg/dL (ref 70–99)
Potassium: 5 mEq/L (ref 3.5–5.3)
Sodium: 132 mEq/L — ABNORMAL LOW (ref 135–145)
TOTAL PROTEIN: 6.7 g/dL (ref 6.0–8.3)
Total Bilirubin: 0.7 mg/dL (ref 0.2–1.2)

## 2014-12-06 LAB — LDL CHOLESTEROL, DIRECT: LDL DIRECT: 84 mg/dL

## 2014-12-06 LAB — POCT GLYCOSYLATED HEMOGLOBIN (HGB A1C): HEMOGLOBIN A1C: 6

## 2014-12-06 MED ORDER — AMITRIPTYLINE HCL 10 MG PO TABS: 10.0000 mg | ORAL_TABLET | Freq: Every evening | ORAL | Status: DC | PRN

## 2014-12-06 MED ORDER — SILODOSIN 8 MG PO CAPS: ORAL_CAPSULE | ORAL | Status: DC

## 2014-12-06 MED ORDER — TRAMADOL HCL 50 MG PO TABS: 50.0000 mg | ORAL_TABLET | Freq: Three times a day (TID) | ORAL | Status: DC | PRN

## 2014-12-06 NOTE — Assessment & Plan Note (Signed)
I'll recheck his creatinine today.

## 2014-12-06 NOTE — Progress Notes (Signed)
   Subjective:    Patient ID: [name], male    DOB: [birth year], 81 y.o.   MRN: [id number]  HPI #1. Complaining of some tingling and paresthesias in his feet and hands. Intermittent. Most bothersome at night. Not pain. Does not seem to be related to walking or any other activity or to cold weather. His cardiologist saw him recently and he was told that his circulatory system was in great shape. #2. Hyperglycemia. History of diabetes mellitus that has been controlled by last all modifications now for couple of years. He continues to check his blood sugars once or twice a week and usually in the 100 to 1:30 range with average about 113. He's had no episodes of low blood sugar. He's not taking any diabetic medications. #3. Needs refill on Rapaflo. Saw his urologist and he switched him to Rapaflo daily. He's doing quite well on that without problem. #4. Very vascular disease with sick sinus syndrome and presence of pacemaker followed by cardiology. In the past she's had some episodes of lightheadedness that seem to be related to his blood pressure medicine so we have previously Tenormin no less than 263 systolic range. He's had no increase in his symptoms of dizziness and in fact they seem somewhat better. #5. Chronic hip pain for which he takes one or 2 tramadol pills a day.   Review of Systems No unusual weight change, no chest pain, no lower extremity edema, no skin changes of foot or hand. Continues to have some symptoms of urinary obstruction but it is baseline without any new or recent changes. Has had improvement in his swallowing issues after he had his most recent esophageal dilation which his daughter-in-law says was "minimal".    Objective:   Physical Exam  Vital signs reviewed. GENERAL: Well-developed, well-nourished, no acute distress. CARDIOVASCULAR: Regular rate and rhythm no murmur gallop or rub NEURO: No gross focal neurological deficits. Intact soft touch sensation bilateral  feet and hands. Intact 2 point discrimination. MSK: Movement of extremity x 4. Can stand from a seated position a chair with moderate difficulty and no assistance. He is walking with a cane but can rise from a chair without use of a cane. VASCULAR: Normal capillary refill. Dorsalis pedis pulses and posterior tibialis pulses 2+ bilaterally equal. Hand and feet are warm. SKIN: Left hand is bandaged status post recent dermatologic surgery. In general he has typical actinic and solar changes of skin on the backs of his hands but otherwise the skin on his hands and feet is normal in color and without lesion or rash.        Assessment & Plan:  #1. Symptoms consistent with very early, mild peripheral neuropathy. I discussed options with him and with his daughter-in-law. We decided to pursue some blood work and to try 10 mg of amitriptyline at night but as a when necessary medicine anyway. Given his history of lower extremity edema and his chronic kidney disease adults think gabapentin would be a good option. I think as long as we've amitriptyline at a very low dose he won't have any problems. I suspect he probably will not take more than a couple of pills anyway.

## 2014-12-06 NOTE — Assessment & Plan Note (Signed)
Recently seen by gastroenterology. His daughter-in-law who is with him today said they did a very minimal stretching of the stricture but he had some improvement in his symptoms.

## 2014-12-06 NOTE — Assessment & Plan Note (Signed)
His systolic today was actually 120. His symptoms seem baseline or maybe even a little improved some not going to change anything that we'll watch this in the future. If he starts having more episodes of lightheadedness I think we need to back off further on his antihypertensive medicines.

## 2014-12-06 NOTE — Telephone Encounter (Signed)
Called rx into pharmacy.

## 2014-12-06 NOTE — Assessment & Plan Note (Signed)
Refill Rapaflo which she is taking daily. Is no longer taking the Proscar.

## 2014-12-06 NOTE — Telephone Encounter (Signed)
Dear [name] Team Please call this in to [facility]--he uses a couple of different pharmacies THANKS! [name]

## 2014-12-07 ENCOUNTER — Telehealth: Payer: Self-pay | Admitting: Family Medicine

## 2014-12-07 NOTE — Telephone Encounter (Signed)
[name] called and wanted to know if Dr. Nori Riis changed the dosage of Rapaflo from 4 mg to 8 mg. Please call her at [phone]. jw

## 2014-12-07 NOTE — Telephone Encounter (Signed)
Dear [name] Team My mistake He should be on 4 mg rapaflo a day. They can either 1. Break them in half and take 1/2 a day or 2. I can call new rx in.   My apologies [name]

## 2014-12-11 NOTE — Telephone Encounter (Signed)
Spoke with [name] and gave her the below message.  She stated that the pills now are capsules so they cannot be split.  I told her I would have Dr. Nori Riis to put in a new rx for the correct dosage. Katharina Caper, April D

## 2014-12-12 MED ORDER — SILODOSIN 4 MG PO CAPS: 4.0000 mg | ORAL_CAPSULE | Freq: Every day | ORAL | Status: DC

## 2014-12-20 ENCOUNTER — Encounter: Payer: Self-pay | Admitting: Family Medicine

## 2014-12-29 ENCOUNTER — Ambulatory Visit (INDEPENDENT_AMBULATORY_CARE_PROVIDER_SITE_OTHER): Payer: Medicare Other | Admitting: *Deleted

## 2014-12-29 DIAGNOSIS — D518 Other vitamin B12 deficiency anemias: Secondary | ICD-10-CM

## 2014-12-29 MED ORDER — CYANOCOBALAMIN 1000 MCG/ML IJ SOLN
1000.0000 ug | Freq: Once | INTRAMUSCULAR | Status: AC
  Administered 2014-12-29: 1000 ug via INTRAMUSCULAR

## 2014-12-29 NOTE — Progress Notes (Signed)
   Pt in nurse clinic for his monthly B-12 injection. Injection given right deltoid.  Derl Barrow, RN

## 2015-01-29 ENCOUNTER — Ambulatory Visit (INDEPENDENT_AMBULATORY_CARE_PROVIDER_SITE_OTHER): Payer: Medicare Other | Admitting: *Deleted

## 2015-01-29 DIAGNOSIS — D518 Other vitamin B12 deficiency anemias: Secondary | ICD-10-CM

## 2015-01-29 MED ORDER — CYANOCOBALAMIN 1000 MCG/ML IJ SOLN
1000.0000 ug | Freq: Once | INTRAMUSCULAR | Status: AC
  Administered 2015-01-29: 1000 ug via INTRAMUSCULAR

## 2015-01-29 NOTE — Progress Notes (Signed)
   Pt in nurse clinic for monthly B-12 injection.  Cyanocobalamin 1000 mcg/ML given left deltoid.  Pt to call for next appt.  Pt denies any symptoms today.  Derl Barrow, RN

## 2015-02-27 ENCOUNTER — Encounter: Payer: Self-pay | Admitting: Cardiovascular Disease

## 2015-02-27 ENCOUNTER — Telehealth: Payer: Self-pay | Admitting: Cardiology

## 2015-02-27 ENCOUNTER — Ambulatory Visit (INDEPENDENT_AMBULATORY_CARE_PROVIDER_SITE_OTHER): Payer: Medicare Other | Admitting: *Deleted

## 2015-02-27 DIAGNOSIS — I495 Sick sinus syndrome: Secondary | ICD-10-CM

## 2015-02-27 NOTE — Telephone Encounter (Signed)
LMOVM reminding pt to send remote transmission.   

## 2015-02-27 NOTE — Progress Notes (Signed)
Remote pacemaker transmission.   

## 2015-03-01 ENCOUNTER — Other Ambulatory Visit: Payer: Self-pay | Admitting: *Deleted

## 2015-03-01 ENCOUNTER — Ambulatory Visit (INDEPENDENT_AMBULATORY_CARE_PROVIDER_SITE_OTHER): Payer: Medicare Other | Admitting: *Deleted

## 2015-03-01 DIAGNOSIS — D518 Other vitamin B12 deficiency anemias: Secondary | ICD-10-CM

## 2015-03-01 MED ORDER — CYANOCOBALAMIN 1000 MCG/ML IJ SOLN
1000.0000 ug | Freq: Once | INTRAMUSCULAR | Status: AC
  Administered 2015-03-01: 1000 ug via INTRAMUSCULAR

## 2015-03-01 MED ORDER — RAMIPRIL 2.5 MG PO CAPS: 2.5000 mg | ORAL_CAPSULE | Freq: Every morning | ORAL | Status: DC

## 2015-03-01 MED ORDER — NEBIVOLOL HCL 10 MG PO TABS: 10.0000 mg | ORAL_TABLET | ORAL | Status: DC

## 2015-03-01 NOTE — Progress Notes (Signed)
   Pt in nurse clinic for Vitamin B-12 injection.  Injection given left deltoid.  Pt to call for next appt.  Derl Barrow, RN

## 2015-03-01 NOTE — Telephone Encounter (Signed)
Verbal order given Dr. Nori Riis to refill 90 day supply.  Derl Barrow, RN

## 2015-03-06 LAB — MDC_IDC_ENUM_SESS_TYPE_REMOTE
Brady Statistic AP VP Percent: 99.8 %
Brady Statistic AP VS Percent: 0.1 %
Brady Statistic AS VP Percent: 0.2 %
Brady Statistic AS VS Percent: 0.1 %
Lead Channel Impedance Value: 530 Ohm
Lead Channel Impedance Value: 613 Ohm
Lead Channel Pacing Threshold Amplitude: 0.75 V
Lead Channel Pacing Threshold Pulse Width: 0.4 ms
Lead Channel Setting Pacing Amplitude: 2.5 V
Lead Channel Setting Pacing Pulse Width: 0.4 ms
MDC IDC SET LEADCHNL RA PACING AMPLITUDE: 2 V
MDC IDC SET LEADCHNL RV SENSING SENSITIVITY: 4 mV

## 2015-03-14 ENCOUNTER — Encounter: Payer: Self-pay | Admitting: Cardiology

## 2015-03-30 ENCOUNTER — Ambulatory Visit (INDEPENDENT_AMBULATORY_CARE_PROVIDER_SITE_OTHER): Payer: Medicare Other | Admitting: *Deleted

## 2015-03-30 ENCOUNTER — Other Ambulatory Visit: Payer: Self-pay | Admitting: *Deleted

## 2015-03-30 DIAGNOSIS — D518 Other vitamin B12 deficiency anemias: Secondary | ICD-10-CM | POA: Diagnosis present

## 2015-03-30 MED ORDER — CYANOCOBALAMIN 1000 MCG/ML IJ SOLN
1000.0000 ug | Freq: Once | INTRAMUSCULAR | Status: AC
  Administered 2015-03-30: 1000 ug via INTRAMUSCULAR

## 2015-03-30 MED ORDER — RIVAROXABAN 15 MG PO TABS: 15.0000 mg | ORAL_TABLET | Freq: Every day | ORAL | Status: DC

## 2015-03-30 MED ORDER — TRAMADOL HCL 50 MG PO TABS: 50.0000 mg | ORAL_TABLET | Freq: Three times a day (TID) | ORAL | Status: DC | PRN

## 2015-03-30 NOTE — Progress Notes (Signed)
Pt in nurse clinic for Vitamin B-12 injection. Injection given right deltoid. Pt to call for next appt.  [name],CMA

## 2015-03-30 NOTE — Telephone Encounter (Signed)
Patient is requesting refills on his tramadol which needs to go to [facility] and his xarelto 10MG  which goes the [facility].  Will forward to MD to refill. Kessler Kopinski,CMA

## 2015-04-24 ENCOUNTER — Ambulatory Visit (INDEPENDENT_AMBULATORY_CARE_PROVIDER_SITE_OTHER): Payer: Medicare Other | Admitting: Family Medicine

## 2015-04-24 ENCOUNTER — Encounter: Payer: Self-pay | Admitting: Family Medicine

## 2015-04-24 VITALS — BP 147/78 | HR 88 | Temp 98.8°F | Wt 190.0 lb

## 2015-04-24 DIAGNOSIS — R0602 Shortness of breath: Secondary | ICD-10-CM | POA: Insufficient documentation

## 2015-04-24 DIAGNOSIS — R109 Unspecified abdominal pain: Secondary | ICD-10-CM

## 2015-04-24 MED ORDER — RIVAROXABAN 15 MG PO TABS: 15.0000 mg | ORAL_TABLET | Freq: Every day | ORAL | Status: DC

## 2015-04-24 NOTE — Assessment & Plan Note (Addendum)
[name] is a 81 y.o. male with complaints of mild abdominal pain with intermittent diarrhea and then also mentioned he feels increasing shortness of breath with exertion.  Shortness of breath with exertion: Appears to be mild shortness of breath, no orthopnea, resolves with rest, able to perform his daily activities, minimal lower extremity edema that has been present for some time, lungs are clear on exam today. Patient advised to make an appointment with his PCP within the next few weeks to follow-up on shortness of breath, and echocardiogram may be needed if it has not resolved. May need to perform 6 minute walk with pulse ox. Patient advised to watch the salt content in his diet, not adding any salt to his meals.

## 2015-04-24 NOTE — Patient Instructions (Addendum)
Sure to drink plenty of water, stay well-hydrated intake your half Meter lacks with 8 ounces of water every day. If you do not produce a normal soft stool, then use 1 With 8 ounces of water daily. If you develop a fever, increased abdominal pain or bloody changes in your stool and please be seen immediately. I have called in your Xarelto with the 90 day dosing. Make an appointment on your way out with Dr. Nori Riis to follow-up on your shortness of breath.

## 2015-04-24 NOTE — Assessment & Plan Note (Signed)
DAETON KLUTH is a 81 y.o. male with complaints of mild abdominal pain with intermittent diarrhea and then also mentioned he feels increasing shortness of breath with exertion.  Abdominal pain: Patient with moderate stool burden on palpation today, mild abdominal pain left periUmbilical. From history given and daughter-in-law it appears that he is not taking his miralax as indicated. He likely has constipation with diarrhea overflow. Advised patient to take his miralax half Daily in 8 ounces of water. If he finds he is not having a good bowel movement, he is to increase this in 2 days to a whole cap Daily. If abdominal pain worsens, he finds himself with dark-colored stools or blood in his stool, fever or unable to tolerate by mouth he is to return immediately to be evaluated. Patient has an appointment scheduled in 10 days with gastroenterology.

## 2015-04-24 NOTE — Progress Notes (Signed)
Subjective:    Patient ID: [name], male    DOB: Nov 14, 1920, 81 y.o.   MRN: [id number]  HPI   Abdominal pain: Patient presents to family medicine clinic, same day appointment, with a one-week history of left-sided abdominal pain. Patient states the pain is mild, and is intermittent. He has noticed intermittent diarrhea and constipation as well. Patient has been seen by gastroenterology and advised to use a half a cap of MiraLAX daily. States that he has only used it a few times a week, last dose was 3 days ago. Patient denies any dark stools or blood in his stool. He does admit of having to strain when he is attempting to have a bowel movement. His last bowel movement was yesterday, and was a very small "pebble ". The day before that he had a watery/loose stool.  Patient denies any weight loss, and has gained 7 pounds in the last 2 months. Patient is with his daughter-in-law today.  Shortness of breath: Patient mentions that he also has noticed a little bit of shortness of breath over the last month or so. He states he only fill shortness of breath when he is exerting himself. Patient sees cardiology, and has recent pacemaker interrogation. No updated echocardiogram in the system, last echo 2006 with normal ejection fraction. She denies any chest pain, palpitations, orthopnea or dizziness associated with shortness of breath. He states that it only occurs with exertion, and resolves quickly once he sits down. He is still able to carry on his daily functions. He reports very mild lower extremity edema that has been present for a long time, no worsening. He has had a 7 pound unintentional weight gain in the last few months. He is not on any diuretics.   Former smoker Past Medical History  Diagnosis Date  . Overactive bladder   . Arthritis   . Diverticulosis of colon (without mention of hemorrhage)   . Gout   . Chronic kidney disease, stage III (moderate)   . Family history of malignant  neoplasm of gastrointestinal tract   . Stricture and stenosis of esophagus   . Acute gastritis without mention of hemorrhage   . Unspecified cataract   . Hypertrophy of prostate with urinary obstruction and other lower urinary tract symptoms (LUTS)   . Personal history of colonic polyps 10/21/2006    hyperplastic   . Vitamin B12 deficiency   . Hiatal hernia   . Type II or unspecified type diabetes mellitus without mention of complication, not stated as uncontrolled     pt states that he checks his blood sugar once weekly, is not on oral meds or insulin  . Atrial fibrillation   . Sinus node dysfunction   . Presence of permanent cardiac pacemaker 10/14/05    medtronic Adapta  . Hypertension   . Esophageal dysmotility   . Second degree AV block 11/29/2014   Allergies  Allergen Reactions  . Codeine     REACTION: nausea   Past Surgical History  Procedure Laterality Date  . Appendectomy    . Tonsillectomy    . Pacemaker insertion  replaced 2 months ago, total of 8 years with pacemaker  . Total hip arthroplasty Bilateral   . Cardioversion  10/01/2011    Procedure: CARDIOVERSION;  Surgeon: Dani Gobble [name];  Location: [facility] OR;  Service: Cardiovascular;  Laterality: N/A;  OP CARDIOVERSION  . Cardioversion  11/02/12    successful  . Eye surgery Bilateral years ago    cataract lens  replacment  . Esophagogastroduodenoscopy (egd) with propofol N/A 09/22/2014    Procedure: ESOPHAGOGASTRODUODENOSCOPY (EGD) WITH PROPOFOL;  Surgeon: Jerene Bears, MD;  Location: [facility];  Service: Gastroenterology;  Laterality: N/A;  . Savory dilation N/A 09/22/2014    Procedure: SAVORY DILATION;  Surgeon: Jerene Bears, MD;  Location: [facility];  Service: Gastroenterology;  Laterality: N/A;  . Permanent pacemaker generator change N/A 03/14/2014    Procedure: PERMANENT PACEMAKER GENERATOR CHANGE;  Surgeon: Sanda Klein, MD;  Location: [place] CATH LAB;  Service: Cardiovascular;  Laterality: N/A;    Review of  Systems Per hPI    Objective:   Physical Exam BP 147/78 mmHg  Pulse 88  Temp(Src) 98.8 F (37.1 C) (Oral)  Wt 190 lb (86.183 kg) Gen: NAD. Nontoxic in appearance, well-developed, well-nourished, elderly Caucasian male. Appears well, cooperative with exam, smiles, very pleasant. HEENT: AT. [place].  Bilateral eyes without injections or icterus. MMM.  CV: RRR,  No murmurs appreciated. Chest: CTAB, no wheeze or crackles. Normal work of breath. Abd: Soft. Obese.ND. Mild tenderness left of umbilicus. Moderate stool burden palpated. BS present. No Masses palpated.  Ext: No erythema. + 1 edema bilateral LE.     Assessment & Plan:  [name] is a 81 y.o. male with complaints of mild abdominal pain with intermittent diarrhea and then also mentioned he feels increasing shortness of breath with exertion.  Abdominal pain: Patient with moderate stool burden on palpation today, mild abdominal pain left periUmbilical. From history given and daughter-in-law it appears that he is not taking his miralax as indicated. He likely has constipation with diarrhea overflow. Advised patient to take his miralax half Daily in 8 ounces of water. If he finds he is not having a good bowel movement, he is to increase this in 2 days to a whole cap Daily. If abdominal pain worsens, he finds himself with dark-colored stools or blood in his stool, fever or unable to tolerate by mouth he is to return immediately to be evaluated. Patient has an appointment scheduled in 10 days with gastroenterology.  Shortness of breath with exertion: Appears to be mild shortness of breath, no orthopnea, resolves with rest, able to perform his daily activities, minimal lower extremity edema that has been present for some time, lungs are clear on exam today. Patient advised to make an appointment with his PCP within the next few weeks to follow-up on shortness of breath, and echocardiogram may be needed if it has not resolved. May need to perform 6  minute walk with pulse ox. Patient advised to watch the salt content in his diet, not adding any salt to his meals.

## 2015-04-30 ENCOUNTER — Ambulatory Visit (INDEPENDENT_AMBULATORY_CARE_PROVIDER_SITE_OTHER): Payer: Medicare Other | Admitting: *Deleted

## 2015-04-30 DIAGNOSIS — E538 Deficiency of other specified B group vitamins: Secondary | ICD-10-CM

## 2015-04-30 MED ORDER — CYANOCOBALAMIN 1000 MCG/ML IJ SOLN
1000.0000 ug | Freq: Once | INTRAMUSCULAR | Status: AC
  Administered 2015-04-30: 1000 ug via INTRAMUSCULAR

## 2015-04-30 NOTE — Progress Notes (Signed)
   Pt in nurse clinic for Vitamin B-12 injection.  Cyanocobalamin 1000 mcg/ml given Lt deltoid. Pt to call for next monthly injection.  Derl Barrow, RN

## 2015-05-03 ENCOUNTER — Ambulatory Visit: Payer: Medicare Other | Admitting: Internal Medicine

## 2015-05-16 ENCOUNTER — Encounter: Payer: Self-pay | Admitting: Family Medicine

## 2015-05-16 ENCOUNTER — Ambulatory Visit (INDEPENDENT_AMBULATORY_CARE_PROVIDER_SITE_OTHER): Payer: Medicare Other | Admitting: Family Medicine

## 2015-05-16 VITALS — BP 136/70 | HR 89 | Temp 97.4°F | Ht 65.0 in | Wt 190.0 lb

## 2015-05-16 DIAGNOSIS — N62 Hypertrophy of breast: Secondary | ICD-10-CM | POA: Diagnosis not present

## 2015-05-16 DIAGNOSIS — N508 Other specified disorders of male genital organs: Secondary | ICD-10-CM

## 2015-05-16 DIAGNOSIS — N5089 Other specified disorders of the male genital organs: Secondary | ICD-10-CM

## 2015-05-16 MED ORDER — SULFAMETHOXAZOLE-TRIMETHOPRIM 400-80 MG PO TABS: 1.0000 | ORAL_TABLET | Freq: Two times a day (BID) | ORAL | Status: DC

## 2015-05-16 NOTE — Progress Notes (Signed)
   Subjective:    Patient ID: [name], male    DOB: 1919/12/31, 81 y.o.   MRN: [id number]  HPI #1. Recurrence of right testicular swelling. He has had this in the past and has had complete workup with CT showing some likely fat in an inguinal hernia that may be contributing to swelling.Marland Kitchen He has not noticed any warmth. It is not particularly tender. It gets worse as the day proceeds and then improves overnight. The little bothersome when he walks. He wonders if some anabiotic's would "clear this up" and says he's used that once or twice in the past for this. He's not had any fevers, no chills or sweats, no blood in his urine, no burning on urination. He does have problems with urine leakage and with starting and maintaining his flow. He has increased his Rapaflo per his urologist to 8 mg a day and says he's not noticed any improvement. He does not want to go back to see the urologist about the testicle. He does not think the urologist "has time for me". #2. Decreasing energy and also some mild problems with memory loss. He lives alone but his son and daughter-in-law check on him daily. He eats 1 meal out every day and the rest using microwave something at home or sometimes his family will bring him meals. Says his appetite is not very good. He is sleeping okay. No overt depression but does not get a lot of pleasure out of doing things anymore. Says he can't be very active in the yard. Says he feels like he is "getting very very old". #3. Feels like he is developing some breast tissue and wonders if it's related to his medications and some white. Breasts are nontender but larger than he they were this time last year. He's had no discharge from the breasts.   Review of Systems See history of present illness.    Objective:   Physical Exam Vital signs are reviewed GEN.: Well-developed elderly male no acute distress ABDOMEN: Soft, positive bowel sounds nontender nondistended GU: Slightly enlarged  right testicle, no increased erythema or warmth, not particularly tender compared with the left. PSYCHIATRIC: Alert and oriented 4. Affect is interactive, speech is a little flat and without in flexion but otherwise normal and fluency in content. NEURO/MSK: He rises from a chair with some difficulty. He is walking with a cane but ambulating without assistance otherwise.       Assessment & Plan:

## 2015-05-16 NOTE — Assessment & Plan Note (Signed)
Looks like recurrence of some swelling with previous workup including CT scan that was benign. He wants to try some antibody extruded is. I don't think it will help however I don't think it will hurt and I do think it will make him feel overall better. He seems somewhat resistant about following up with his urologist. Burnis Medin continue his Rapaflo at the current dose but would like to check back with him in a month if it is not having improvement in his urinary symptoms then I probably like to take that down to the 0.4 mg dose because previously's had some dizziness with the higher dose.

## 2015-05-17 ENCOUNTER — Telehealth: Payer: Self-pay | Admitting: Family Medicine

## 2015-05-17 MED ORDER — SULFAMETHOXAZOLE-TRIMETHOPRIM 400-80 MG PO TABS: 1.0000 | ORAL_TABLET | Freq: Two times a day (BID) | ORAL | Status: DC

## 2015-05-17 NOTE — Telephone Encounter (Signed)
Resent to [facility] per pt request. Also contacted mail service to stop delivery and [employer] stated that it should be able to be cancelled, he was sending an email to the processing place to stop shipment. Katharina Caper, Jareth Pardee D, [place]

## 2015-05-17 NOTE — Telephone Encounter (Signed)
Pt called and would like his Bactrim called in to the [facility] and would like [place] to cancel the order to the mail order pharmacy for this medication. jw

## 2015-05-22 ENCOUNTER — Other Ambulatory Visit: Payer: Self-pay | Admitting: *Deleted

## 2015-05-23 MED ORDER — SULFAMETHOXAZOLE-TRIMETHOPRIM 400-80 MG PO TABS: 1.0000 | ORAL_TABLET | Freq: Two times a day (BID) | ORAL | Status: DC

## 2015-05-30 ENCOUNTER — Ambulatory Visit (INDEPENDENT_AMBULATORY_CARE_PROVIDER_SITE_OTHER): Payer: Medicare Other | Admitting: *Deleted

## 2015-05-30 DIAGNOSIS — I495 Sick sinus syndrome: Secondary | ICD-10-CM | POA: Diagnosis not present

## 2015-05-30 NOTE — Progress Notes (Signed)
Remote pacemaker transmission.   

## 2015-06-01 ENCOUNTER — Telehealth: Payer: Self-pay | Admitting: *Deleted

## 2015-06-01 ENCOUNTER — Ambulatory Visit (INDEPENDENT_AMBULATORY_CARE_PROVIDER_SITE_OTHER): Payer: Medicare Other | Admitting: *Deleted

## 2015-06-01 DIAGNOSIS — D518 Other vitamin B12 deficiency anemias: Secondary | ICD-10-CM

## 2015-06-01 MED ORDER — CYANOCOBALAMIN 1000 MCG/ML IJ SOLN
1000.0000 ug | Freq: Once | INTRAMUSCULAR | Status: AC
  Administered 2015-06-01: 1000 ug via INTRAMUSCULAR

## 2015-06-01 NOTE — Telephone Encounter (Signed)
Pt was here for a RN visit this AM.  He wanted me to let Dr. Nori Riis know that he has finished the Abx and his groin pain is "about the same, maybe a little worse".  Offered appt for today but pt was hesitant and would rather see Dr. Nori Riis.  Appt made for Wednesday @ 11:30, pt satisfied with this.  Will forward to MD at pts request. Markice Torbert, Salome Spotted

## 2015-06-01 NOTE — Progress Notes (Signed)
Pt in nurse clinic for Vitamin B-12 injection. Cyanocobalamin 1000 mcg/ml given Lt deltoid. Pt to call for next monthly injection. Fleeger, Salome Spotted

## 2015-06-04 LAB — HM DIABETES EYE EXAM

## 2015-06-05 LAB — CUP PACEART REMOTE DEVICE CHECK
Lead Channel Pacing Threshold Amplitude: 0.625 V
Lead Channel Pacing Threshold Pulse Width: 0.4 ms
Lead Channel Setting Pacing Amplitude: 2 V
Lead Channel Setting Pacing Amplitude: 2.5 V
Lead Channel Setting Pacing Pulse Width: 0.4 ms
Lead Channel Setting Sensing Sensitivity: 4 mV
MDC IDC SESS DTM: 20160719122547

## 2015-06-06 ENCOUNTER — Ambulatory Visit (INDEPENDENT_AMBULATORY_CARE_PROVIDER_SITE_OTHER): Payer: Medicare Other | Admitting: Family Medicine

## 2015-06-06 VITALS — BP 156/74 | HR 89 | Temp 97.9°F | Ht 65.0 in | Wt 190.4 lb

## 2015-06-06 DIAGNOSIS — M25551 Pain in right hip: Secondary | ICD-10-CM | POA: Diagnosis present

## 2015-06-06 DIAGNOSIS — G8929 Other chronic pain: Secondary | ICD-10-CM | POA: Diagnosis not present

## 2015-06-06 NOTE — Progress Notes (Signed)
   Subjective:    Patient ID: [name], male    DOB: 1920-10-13, 81 y.o.   MRN: [id number]  HPI He is here with his daughter-in-law today for couple of issues #1. Continues to have right groin pain. Does note today with some prompting from his daughter-in-law that it seems to resolve when he takes his tramadol for his hip pain. He is only taking the tramadol twice a day. He's not having any burning on urination, no blood in his urine. The anabiotic I gave him did not seem to make much difference in his groin pain. #2. He had a fall at the house, where his "chair rolled right out from underneath me". Landed on his elbow and skinned up his arm. #3. He has a broken tooth that he thinks needs to be evaluated by the dentist but has been putting it off. Pulse me to look at that. #4. Still having some problems with swallowing occasionally. His last stretching procedure by the GI doctor was in November 2015. He feels like he needs to have that done again. In fact, he says it never really got much better after the last stretching episode. He has some choking with larger pieces of food. His daughter-in-law notes that he eats too fast however.   Review of Systems See history of present illness. He denies any change in his activity level or energy level although he says it's not very good. No unusual shortness of breath. No unusual lower extremity swelling. No vomiting, no diarrhea, no weight loss. Appetite is not good. It is however unchanged.    Objective:   Physical Exam  Vital signs are reviewed GENERAL: Well-developed elderly male no acute distress EXTREMITY: His left elbow and forearm have a lot of ecchymoses any is a small laceration that shows no sign of infection. Lacerations about centimeter in diameter. No evidence of current bleeding. Elbow has full range of motion. Hip/groin: The area of tenderness he points to today is actually within the inguinal crease and he says it is deep which makes  you think it is from the hip joint. He has some stiffness on internal and external rotation but this does not exactly reproduce his pain. GU: His right testicle is not particularly tender to palpation.      Assessment & Plan:  #1. Recent fall. I put some Tegaderm and an Ace bandage on the skin laceration. I would keep that there for 48 hours. #2. Hip and groin pain. I do think there is some component of hip arthritis so we will try adding a mid day dose of tramadol and I'll follow him up if this is not working in a month. He can call and I would schedule CT scan of the pelvis to rule out inguinal hernia etc. Alternatively could see him back. #3. See patient instructions for eye recommendations on GI and dental care.

## 2015-06-06 NOTE — Patient Instructions (Signed)
Increase your fiber to TWICE the dose on every other day Add the tramadol for a dose in the mid day---see if this helps your hip and groin p[ain CALL THE DENTIST If your swallowing contnues to be problem, call Dr Hilarie Fredrickson and see him

## 2015-06-07 ENCOUNTER — Telehealth: Payer: Self-pay | Admitting: Cardiovascular Disease

## 2015-06-07 NOTE — Telephone Encounter (Signed)
Janett Billow is calling in stating that the pt is currently in the office to have a tooth extracted but he takes Xarelto. She says that the pt told her that he did not take the medication yesterday and would like to know instructions on coming off the medication and starting back. Please call back as soon as possible.   Thanks

## 2015-06-07 NOTE — Telephone Encounter (Signed)
Pt for single tooth extraction - held Xarelto on his own (none taken yesterday).  Spoke w/ Erasmo Downer, advised OK, resume Xarelto tonight.  Communicated these instructions to [facility] at Dr. Bryon Lions office - instructions acknowledged.

## 2015-06-12 ENCOUNTER — Encounter: Payer: Self-pay | Admitting: Family Medicine

## 2015-06-12 ENCOUNTER — Ambulatory Visit (INDEPENDENT_AMBULATORY_CARE_PROVIDER_SITE_OTHER): Payer: Medicare Other | Admitting: Family Medicine

## 2015-06-12 VITALS — BP 121/65 | HR 97 | Temp 98.4°F | Wt 187.0 lb

## 2015-06-12 DIAGNOSIS — H60391 Other infective otitis externa, right ear: Secondary | ICD-10-CM | POA: Diagnosis present

## 2015-06-12 DIAGNOSIS — H60399 Other infective otitis externa, unspecified ear: Secondary | ICD-10-CM | POA: Insufficient documentation

## 2015-06-12 MED ORDER — CIPROFLOXACIN-HYDROCORTISONE 0.2-1 % OT SUSP: 3.0000 [drp] | Freq: Two times a day (BID) | OTIC | Status: DC

## 2015-06-12 NOTE — Patient Instructions (Signed)
It was great seeing you today.   1. Apply ear drops to right ear: 3 drops, twice a day for 7 days.  2. Call if you develop fevers, chills or redness / warmth around your right ear.    Please bring all your medications to every doctors visit  Sign up for My Chart to have easy access to your labs results, and communication with your Primary care physician.  Next Appointment  Please make an appointment next week to have your ear rechecked.    I look forward to talking with you again at our next visit. If you have any questions or concerns before then, please call the clinic at [phone].  Take Care,   Dr Phill Myron

## 2015-06-13 ENCOUNTER — Encounter: Payer: Self-pay | Admitting: Family Medicine

## 2015-06-13 NOTE — Assessment & Plan Note (Signed)
Likely due to chronic scratching - Ear canal debrided - Cipro HC drops x 7 days - f/u for reassessment after antibiotics completed as unable to visualize TM due to infection and swelling

## 2015-06-13 NOTE — Progress Notes (Signed)
   Subjective:    Patient ID: [name], male    DOB: 08-31-1920, 81 y.o.   MRN: [id number]  Seen for Same day visit for   CC: right ear pain and bleeding  Reports right ear pain for the past 2 days and bleeding starting today.  Reports chronic bilateral ear itching, and he scratches them often.  Wears hearing aids. Has been unable to wear right one due to pain today.  Denies fevers, chills.   Review of Systems   See HPI for ROS. Objective:  BP 121/65 mmHg  Pulse 97  Temp(Src) 98.4 F (36.9 C) (Oral)  Wt 187 lb (84.823 kg)  General: NAD R Ear: Swollen and erythematous external ear canal with mild bleeding and pus. Unable to visualize TM    Assessment & Plan:  See Problem List Documentation

## 2015-06-22 ENCOUNTER — Encounter: Payer: Self-pay | Admitting: Family Medicine

## 2015-06-22 ENCOUNTER — Ambulatory Visit (INDEPENDENT_AMBULATORY_CARE_PROVIDER_SITE_OTHER): Payer: Medicare Other | Admitting: Family Medicine

## 2015-06-22 VITALS — BP 151/70 | HR 92 | Temp 97.5°F | Ht 65.0 in | Wt 185.0 lb

## 2015-06-22 DIAGNOSIS — H60391 Other infective otitis externa, right ear: Secondary | ICD-10-CM | POA: Diagnosis present

## 2015-06-22 NOTE — Assessment & Plan Note (Signed)
Pertinent S&O  Follow-up today for right ear pain and drainage.  Pain and drainage resolved with Cipro drops  Continues to have diffuse ear canal erythema and bleeding  History of multiple skin cancer. One on right ear that he can't recall what kind it was  No constitutional symptoms or adenopathy Assessment  Persistent right ear erythema and bleeding concerning for unresolved external otitis versus squamous cell cancer versus wound due to pressure/irritation from hear aids.  Low suspicion for malignant otitis externa, currently with no fevers or pain. Plan  Advised not wearing right hearing aid  Referred to ENT   See AVS for return precautions

## 2015-06-22 NOTE — Progress Notes (Signed)
  Patient name: [name] MRN [id number]  Date of birth: Oct 12, 1920  CC & HPI:  [name] is a 81 y.o. male presenting today for follow-up for right ear pain.  He denies any current right ear pain or drainage since completing Cipro drops 2 days ago. Denies fevers or chills.  Denies night sweats, weight loss, or lymphadenopathy.  History of multiple skin cancers, one on right ear .  He cannot recall the type.   ROS: See HPI   Medical Hx:  Reviewed  Medications & Allergies: Reviewed  Social History: Reviewed:   Objective Findings:  Vitals: BP 151/70 mmHg  Pulse 92  Temp(Src) 97.5 F (36.4 C) (Oral)  Ht 5\' 5"  (1.651 m)  Wt 185 lb (83.915 kg)  BMI 30.79 kg/m2  Gen: NAD Right Ear: ear canal erythema and bleeding Neck: No cervical or clavicular lymphadenopathy CV: RRR w/o m/r/g, pulses +2 b/l Resp: CTAB w/ normal respiratory effort   Assessment & Plan:   Please See Problem Focused Assessment & Plan

## 2015-06-22 NOTE — Patient Instructions (Signed)
It was great seeing you today.   1. Do not wear hearing aid in right ear.  2. I have referred you to ENT for your right ear pain; call the clinic if you have not heard from them in the next week.  3. Call or return to clinic if you develop any fevers, worsening ear pain or HAs  Next Appointment  Please make an appointment with Dr Nori Riis in 1 month for right ear pain f/u   If you have any questions or concerns before then, please call the clinic at [phone].  Take Care,   Dr Phill Myron

## 2015-06-27 ENCOUNTER — Encounter: Payer: Self-pay | Admitting: Cardiology

## 2015-06-29 ENCOUNTER — Ambulatory Visit (INDEPENDENT_AMBULATORY_CARE_PROVIDER_SITE_OTHER): Payer: Medicare Other | Admitting: *Deleted

## 2015-06-29 ENCOUNTER — Other Ambulatory Visit: Payer: Self-pay | Admitting: *Deleted

## 2015-06-29 DIAGNOSIS — D518 Other vitamin B12 deficiency anemias: Secondary | ICD-10-CM

## 2015-06-29 MED ORDER — CYANOCOBALAMIN 1000 MCG/ML IJ SOLN
1000.0000 ug | Freq: Once | INTRAMUSCULAR | Status: AC
  Administered 2015-06-29: 1000 ug via INTRAMUSCULAR

## 2015-06-29 MED ORDER — RAMIPRIL 2.5 MG PO CAPS: 2.5000 mg | ORAL_CAPSULE | Freq: Every morning | ORAL | Status: DC

## 2015-06-29 NOTE — Progress Notes (Signed)
   Pt in nurse clinic for Vitamin B-12 injection.  Injection given Left Deltoid.  Pt to call next month for appt.  Derl Barrow, RN

## 2015-07-04 ENCOUNTER — Encounter: Payer: Self-pay | Admitting: Cardiovascular Disease

## 2015-07-16 ENCOUNTER — Telehealth: Payer: Self-pay

## 2015-07-16 MED ORDER — DRONEDARONE HCL 400 MG PO TABS: 400.0000 mg | ORAL_TABLET | Freq: Two times a day (BID) | ORAL | Status: DC

## 2015-07-18 NOTE — Telephone Encounter (Signed)
Patient walked in office 07/16/15 requesting written prescription for Multaq.Trixie Dredge RN will have Dr.[name] sign prescription 07/17/15.

## 2015-08-02 ENCOUNTER — Ambulatory Visit (INDEPENDENT_AMBULATORY_CARE_PROVIDER_SITE_OTHER): Payer: Medicare Other | Admitting: *Deleted

## 2015-08-02 DIAGNOSIS — D518 Other vitamin B12 deficiency anemias: Secondary | ICD-10-CM | POA: Diagnosis present

## 2015-08-02 MED ORDER — CYANOCOBALAMIN 1000 MCG/ML IJ SOLN
1000.0000 ug | Freq: Once | INTRAMUSCULAR | Status: AC
  Administered 2015-08-02: 1000 ug via INTRAMUSCULAR

## 2015-08-02 NOTE — Progress Notes (Signed)
Patient in today for B12 shot. Injection given in left deltoid, site unremarkable, patient without complaints.

## 2015-08-18 ENCOUNTER — Emergency Department (HOSPITAL_COMMUNITY): Payer: Medicare Other

## 2015-08-18 ENCOUNTER — Encounter (HOSPITAL_COMMUNITY): Payer: Self-pay

## 2015-08-18 ENCOUNTER — Emergency Department (HOSPITAL_COMMUNITY)
Admission: EM | Admit: 2015-08-18 | Discharge: 2015-08-18 | Disposition: A | Discharge status: Home / Self Care | Payer: Medicare Other | Source: Home / Self Care | Attending: Emergency Medicine | Admitting: Emergency Medicine

## 2015-08-18 DIAGNOSIS — K567 Ileus, unspecified: Secondary | ICD-10-CM | POA: Diagnosis not present

## 2015-08-18 DIAGNOSIS — R55 Syncope and collapse: Secondary | ICD-10-CM

## 2015-08-18 DIAGNOSIS — E860 Dehydration: Secondary | ICD-10-CM | POA: Diagnosis not present

## 2015-08-18 LAB — COMPREHENSIVE METABOLIC PANEL
ALBUMIN: 3.8 g/dL (ref 3.5–5.0)
ALK PHOS: 46 U/L (ref 38–126)
ALT: 15 U/L — ABNORMAL LOW (ref 17–63)
ANION GAP: 8 (ref 5–15)
AST: 19 U/L (ref 15–41)
BUN: 19 mg/dL (ref 6–20)
CALCIUM: 9.2 mg/dL (ref 8.9–10.3)
CO2: 26 mmol/L (ref 22–32)
Chloride: 102 mmol/L (ref 101–111)
Creatinine, Ser: 1.48 mg/dL — ABNORMAL HIGH (ref 0.61–1.24)
GFR calc Af Amer: 45 mL/min — ABNORMAL LOW (ref >60)
GFR calc non Af Amer: 39 mL/min — ABNORMAL LOW (ref >60)
GLUCOSE: 154 mg/dL — AB (ref 65–99)
POTASSIUM: 5.2 mmol/L — AB (ref 3.5–5.1)
SODIUM: 136 mmol/L (ref 135–145)
Total Bilirubin: 0.9 mg/dL (ref 0.3–1.2)
Total Protein: 6.4 g/dL — ABNORMAL LOW (ref 6.5–8.1)

## 2015-08-18 LAB — CBC WITH DIFFERENTIAL/PLATELET
BASOS ABS: 0 10*3/uL (ref 0.0–0.1)
BASOS PCT: 0 %
EOS ABS: 0.1 10*3/uL (ref 0.0–0.7)
Eosinophils Relative: 1 %
HEMATOCRIT: 41.5 % (ref 39.0–52.0)
HEMOGLOBIN: 13.8 g/dL (ref 13.0–17.0)
Lymphocytes Relative: 5 %
Lymphs Abs: 0.4 10*3/uL — ABNORMAL LOW (ref 0.7–4.0)
MCH: 30.8 pg (ref 26.0–34.0)
MCHC: 33.3 g/dL (ref 30.0–36.0)
MCV: 92.6 fL (ref 78.0–100.0)
MONOS PCT: 8 %
Monocytes Absolute: 0.7 10*3/uL (ref 0.1–1.0)
NEUTROS ABS: 7.2 10*3/uL (ref 1.7–7.7)
NEUTROS PCT: 86 %
Platelets: 163 10*3/uL (ref 150–400)
RBC: 4.48 MIL/uL (ref 4.22–5.81)
RDW: 14.2 % (ref 11.5–15.5)
WBC: 8.4 10*3/uL (ref 4.0–10.5)

## 2015-08-18 LAB — I-STAT TROPONIN, ED
TROPONIN I, POC: 0 ng/mL (ref 0.00–0.08)
TROPONIN I, POC: 0 ng/mL (ref 0.00–0.08)

## 2015-08-18 LAB — CBG MONITORING, ED: GLUCOSE-CAPILLARY: 126 mg/dL — AB (ref 65–99)

## 2015-08-18 NOTE — Discharge Instructions (Signed)
Near-Syncope Near-syncope (commonly known as near fainting) is sudden weakness, dizziness, or feeling like you might pass out. During an episode of near-syncope, you may also develop pale skin, have tunnel vision, or feel sick to your stomach (nauseous). Near-syncope may occur when getting up after sitting or while standing for a long time. It is caused by a sudden decrease in blood flow to the brain. This decrease can result from various causes or triggers, most of which are not serious. However, because near-syncope can sometimes be a sign of something serious, a medical evaluation is required. The specific cause is often not determined. HOME CARE INSTRUCTIONS  Monitor your condition for any changes. The following actions may help to alleviate any discomfort you are experiencing:  Have someone stay with you until you feel stable.  Lie down right away and prop your feet up if you start feeling like you might faint. Breathe deeply and steadily. Wait until all the symptoms have passed. Most of these episodes last only a few minutes. You may feel tired for several hours.   Drink enough fluids to keep your urine clear or pale yellow.   If you are taking blood pressure or heart medicine, get up slowly when seated or lying down. Take several minutes to sit and then stand. This can reduce dizziness.  Follow up with your health care provider as directed. SEEK IMMEDIATE MEDICAL CARE IF:   You have a severe headache.   You have unusual pain in the chest, abdomen, or back.   You are bleeding from the mouth or rectum, or you have black or tarry stool.   You have an irregular or very fast heartbeat.   You have repeated fainting or have seizure-like jerking during an episode.   You faint when sitting or lying down.   You have confusion.   You have difficulty walking.   You have severe weakness.   You have vision problems.  MAKE SURE YOU:   Understand these instructions.  Will  watch your condition.  Will get help right away if you are not doing well or get worse. Document Released: 11/03/2005 Document Revised: 11/08/2013 Document Reviewed: 04/08/2013 Exit[employer] Patient Information 2015 [employer]. This information is not intended to replace advice given to you by your health care provider. Make sure you discuss any questions you have with your health care provider.  

## 2015-08-18 NOTE — ED Provider Notes (Signed)
CSN: [id number]     Arrival date & time 08/18/15  0930 History   None    Chief Complaint  Patient presents with  . Near Syncope     (Consider location/radiation/quality/duration/timing/severity/associated sxs/prior Treatment) Patient is a 81 y.o. male presenting with near-syncope. The history is provided by the patient.  Near Syncope This is a new problem. The current episode started less than 1 hour ago. The problem occurs rarely. The problem has been resolved. Pertinent negatives include no chest pain, no abdominal pain, no headaches and no shortness of breath. Nothing aggravates the symptoms. Nothing relieves the symptoms. He has tried nothing for the symptoms. The treatment provided no relief.   81 yo M with a chief complaint of near syncope. Patient states he had a large bowel movement and when he got up felt like he was going to pass out. Patient then laid down on the ground and felt much better. EMS was called and brought to our facility. Patient denies any chest pain shortness of breath. Patient denies any headache or neck pain. Patient has a mild discomfort in his epigastrium which she relates to his prior esophageal strictures. Feels that he has been eating and drinking normally.  Past Medical History  Diagnosis Date  . Overactive bladder   . Arthritis   . Diverticulosis of colon (without mention of hemorrhage)   . Gout   . Chronic kidney disease, stage III (moderate)   . Family history of malignant neoplasm of gastrointestinal tract   . Stricture and stenosis of esophagus   . Acute gastritis without mention of hemorrhage   . Unspecified cataract   . Hypertrophy of prostate with urinary obstruction and other lower urinary tract symptoms (LUTS)   . Personal history of colonic polyps 10/21/2006    hyperplastic   . Vitamin B12 deficiency   . Hiatal hernia   . Type II or unspecified type diabetes mellitus without mention of complication, not stated as uncontrolled     pt states  that he checks his blood sugar once weekly, is not on oral meds or insulin  . Atrial fibrillation ([place])   . Sinus node dysfunction ([facility])   . Presence of permanent cardiac pacemaker 10/14/05    medtronic Adapta  . Hypertension   . Esophageal dysmotility   . Second degree AV block 11/29/2014   Past Surgical History  Procedure Laterality Date  . Appendectomy    . Tonsillectomy    . Pacemaker insertion  replaced 2 months ago, total of 8 years with pacemaker  . Total hip arthroplasty Bilateral   . Cardioversion  10/01/2011    Procedure: CARDIOVERSION;  Surgeon: Dani Gobble [name];  Location: [facility] OR;  Service: Cardiovascular;  Laterality: N/A;  OP CARDIOVERSION  . Cardioversion  11/02/12    successful  . Eye surgery Bilateral years ago    cataract lens replacment  . Esophagogastroduodenoscopy (egd) with propofol N/A 09/22/2014    Procedure: ESOPHAGOGASTRODUODENOSCOPY (EGD) WITH PROPOFOL;  Surgeon: Jerene Bears, MD;  Location: [facility];  Service: Gastroenterology;  Laterality: N/A;  . Savory dilation N/A 09/22/2014    Procedure: SAVORY DILATION;  Surgeon: Jerene Bears, MD;  Location: [facility];  Service: Gastroenterology;  Laterality: N/A;  . Permanent pacemaker generator change N/A 03/14/2014    Procedure: PERMANENT PACEMAKER GENERATOR CHANGE;  Surgeon: Sanda Klein, MD;  Location: [facility];  Service: Cardiovascular;  Laterality: N/A;   Family History  Problem Relation Age of Onset  . Colon cancer Brother 90  .  Hypertension Brother   . Hypertension Father   . Parkinsonism Mother   . Hodgkin's lymphoma Grandchild    Social History  Substance Use Topics  . Smoking status: Former Smoker -- 1.00 packs/day for 20 years    Types: Cigarettes    Quit date: 11/17/1969  . Smokeless tobacco: Never Used  . Alcohol Use: No     Comment: none since 1990    Review of Systems  Constitutional: Negative for fever and chills.  HENT: Negative for congestion and facial swelling.   Eyes:  Negative for discharge and visual disturbance.  Respiratory: Negative for shortness of breath.   Cardiovascular: Positive for near-syncope. Negative for chest pain and palpitations.  Gastrointestinal: Negative for vomiting, abdominal pain and diarrhea.  Musculoskeletal: Negative for myalgias and arthralgias.  Skin: Negative for color change and rash.  Neurological: Positive for syncope and light-headedness. Negative for tremors and headaches.  Psychiatric/Behavioral: Negative for confusion and dysphoric mood.      Allergies  Codeine  Home Medications   Prior to Admission medications   Medication Sig Start Date End Date Taking? Authorizing Provider  beta carotene w/minerals (OCUVITE) tablet Take 1 tablet by mouth every morning.    Yes Historical Provider, MD  dronedarone (MULTAQ) 400 MG tablet Take 1 tablet (400 mg total) by mouth 2 (two) times daily with a meal. 07/16/15  Yes Mihai [name], MD  nebivolol (BYSTOLIC) 10 MG tablet Take 1 tablet (10 mg total) by mouth every morning. 03/01/15  Yes Dickie La, MD  OVER THE COUNTER MEDICATION Place 1 each into both ears as needed (for allergies.). Ear drops.   Yes Historical Provider, MD  pantoprazole (PROTONIX) 40 MG tablet Take 1 tablet (40 mg total) by mouth daily before breakfast. 11/28/14  Yes Jerene Bears, MD  Probiotic Product (Fleming-Neon) Take 1 tablet by mouth every morning.    Yes Historical Provider, MD  ramipril (ALTACE) 2.5 MG capsule Take 1 capsule (2.5 mg total) by mouth every morning. 06/29/15  Yes Dickie La, MD  Rivaroxaban (XARELTO) 15 MG TABS tablet Take 1 tablet (15 mg total) by mouth at bedtime. 04/24/15  Yes Renee A Kuneff, DO  silodosin (RAPAFLO) 4 MG CAPS capsule Take 1 capsule (4 mg total) by mouth daily with breakfast. 12/12/14  Yes Dickie La, MD  sulfamethoxazole-trimethoprim (BACTRIM) 400-80 MG per tablet Take 1 tablet by mouth 2 (two) times daily. 05/23/15  Yes Dickie La, MD  traMADol (ULTRAM) 50 MG  tablet Take 1 tablet (50 mg total) by mouth every 8 (eight) hours as needed for moderate pain. 03/30/15  Yes Dickie La, MD  amitriptyline (ELAVIL) 10 MG tablet Take 1 tablet (10 mg total) by mouth at bedtime as needed for sleep. 12/06/14   Dickie La, MD  ciprofloxacin-hydrocortisone (CIPRO [facility]) otic suspension Place 3 drops into the right ear 2 (two) times daily. 06/12/15   Olam Idler, MD  pantoprazole (PROTONIX) 40 MG tablet Take 1 tablet (40 mg total) by mouth daily before breakfast. 11/28/14   Jerene Bears, MD   BP 166/65 mmHg  Pulse 85  Temp(Src) 97.7 F (36.5 C) (Oral)  Resp 12  Ht 5\' 8"  (1.727 m)  Wt 195 lb (88.451 kg)  BMI 29.66 kg/m2  SpO2 98% Physical Exam  Constitutional: He is oriented to person, place, and time. He appears well-developed and well-nourished.  HENT:  Head: Normocephalic and atraumatic.  Eyes: EOM are normal. Pupils are equal, round,  and reactive to light.  Neck: Normal range of motion. Neck supple. No JVD present.  Cardiovascular: Normal rate and regular rhythm.  Exam reveals no gallop and no friction rub.   No murmur heard. Pulmonary/Chest: No respiratory distress. He has no wheezes.  Abdominal: He exhibits no distension. There is no tenderness. There is no rebound and no guarding.  Musculoskeletal: Normal range of motion.  Neurological: He is alert and oriented to person, place, and time.  Skin: No rash noted. No pallor.  Psychiatric: He has a normal mood and affect. His behavior is normal.    ED Course  Procedures (including critical care time) Labs Review Labs Reviewed  CBC WITH DIFFERENTIAL/PLATELET - Abnormal; Notable for the following:    Lymphs Abs 0.4 (*)    All other components within normal limits  COMPREHENSIVE METABOLIC PANEL - Abnormal; Notable for the following:    Potassium 5.2 (*)    Glucose, Bld 154 (*)    Creatinine, Ser 1.48 (*)    Total Protein 6.4 (*)    ALT 15 (*)    GFR calc non Af Amer 39 (*)    GFR calc Af Amer 45 (*)     All other components within normal limits  CBG MONITORING, ED - Abnormal; Notable for the following:    Glucose-Capillary 126 (*)    All other components within normal limits  I-STAT TROPOININ, ED  I-STAT TROPOININ, ED    Imaging Review Ct Abdomen Pelvis Wo Contrast  08/18/2015   CLINICAL DATA:  Acute epigastric abdominal pain for 2 days. Syncope.  EXAM: CT ABDOMEN AND PELVIS WITHOUT CONTRAST  TECHNIQUE: Multidetector CT imaging of the abdomen and pelvis was performed following the standard protocol without IV contrast.  COMPARISON:  08/31/2014, 01/05/2014  FINDINGS: Lower chest: Pacemaker in the rest of the heart. Heart is enlarged. Thoracic aortic atherosclerosis and coronary calcifications noted. No pericardial or pleural effusion. No significant hiatal hernia.  Lower thoracic degenerative osteophytes on the right.  Minor basilar scarring.  Otherwise lung bases clear.  Abdomen: Liver, gallbladder, biliary system, atrophic fatty replaced pancreas, and adrenal glands are within normal limits for age and noncontrast imaging. Spleen demonstrates scattered calcifications from granulomatous disease. Small accessory splenules in the left upper quadrant.  Kidneys demonstrate no acute obstruction, hydronephrosis, perinephric inflammatory process, or obstructing ureteral calculus on either side.  Aortic atherosclerosis noted without aneurysm or retroperitoneal hemorrhage.  Mild to moderate distention of the stomach and proximal small bowel with contrast and air. Distal small bowel is collapsed however there is air in the colon. Colonic diverticulosis noted. Appearance of the bowel is nonspecific.  No abdominal free fluid, fluid collection, hemorrhage, abscess, or adenopathy.  Pelvis: Diverticulosis of the descending colon and sigmoid. No acute inflammatory process. No pelvic free fluid, fluid collection, hemorrhage, abscess, adenopathy, inguinal abnormality, or hernia. Urinary bladder unremarkable. No acute  distal bowel process.  Left hip replacement noted creating artifact. Degenerative changes of the right hip, pelvis and spine.  IMPRESSION: Gastric and proximal small bowel distention with air and contrast, nonspecific. No definite obstruction pattern. Negative for free air.  No acute obstructing urinary tract or ureteral calculus. No hydronephrosis or obstructive uropathy  Abdominal atherosclerosis  Colonic diverticulosis without acute inflammation   Electronically Signed   By: Jerilynn Mages.  Shick M.D.   On: 08/18/2015 14:30   I have personally reviewed and evaluated these images and lab results as part of my medical decision-making.   EKG Interpretation   Date/Time:  Saturday August 18 2015 09:39:19 EDT Ventricular Rate:  82 PR Interval:  148 QRS Duration: 166 QT Interval:  460 QTC Calculation: 537 R Axis:   -87 Text Interpretation:  Atrial-ventricular dual-paced rhythm No further  analysis attempted due to paced rhythm dual paceing when compared to V  pacing on prior ecg Otherwise no significant change Confirmed by Riven Beebe MD,  Quillian Quince ([id number]) on 08/18/2015 9:44:35 AM      MDM   Final diagnoses:  Syncope and collapse    82 yo M with a chief complaint of near syncope. This occurred just prior to arrival. Vital signs stable. Blood sugar normal. EKG with a dual paced rhythm. Will interrogate pacemaker. Obtain a i-STAT troponin CBC CMP. Patient with a discomfort in his chest which feels like his prior esophageal stricture.   Family arrived for the patient states that they feel like he is presenting bili as well as had a bowel obstruction before. Patient states that he has no abdominal pain however on reexamination he says it does hurt. Will obtain a CT scan of the abdomen and pelvis without contrast due to renal insufficiency.  CT scan without any acute findings. Delta troponin negative. Patient able to tolerate by mouth in the ED without difficulty. Discussed results of his pacemaker interrogation  with the [employer] technician. See no etiology for his near syncope.  4:27 PM:  I have discussed the diagnosis/risks/treatment options with the patient and family and believe the pt to be eligible for discharge home to follow-up with PCP. We also discussed returning to the ED immediately if new or worsening sx occur. We discussed the sx which are most concerning (e.g., recurrent event, inability to tolerate PO, intractable vomiting ) that necessitate immediate return. Medications administered to the patient during their visit and any new prescriptions provided to the patient are listed below.  Medications given during this visit Medications - No data to display  New Prescriptions   No medications on file     The patient appears reasonably screen and/or stabilized for discharge and I doubt any other medical condition or other [facility] requiring further screening, evaluation, or treatment in the ED at this time prior to discharge.    Deno Etienne, DO 08/18/15 1627

## 2015-08-18 NOTE — ED Notes (Signed)
Called [employer]. For update 1455;

## 2015-08-18 NOTE — ED Notes (Signed)
Pt brought in EMS for near syncopal episode.  Pt reports a "weird feeling" in his epigastric area x2 days.  Pt reports he was having a BM this morning and became very weak, diaphoretic and near syncopal.  Pt laid himself in the floor and reports he felt better after lying down.  Pt is AV paced.

## 2015-08-20 ENCOUNTER — Inpatient Hospital Stay (HOSPITAL_COMMUNITY)
Admission: EM | Admit: 2015-08-20 | Discharge: 2015-08-30 | DRG: 389 | Disposition: A | Discharge status: Skilled Nursing Facility | Payer: Medicare Other | Attending: Family Medicine | Admitting: Family Medicine

## 2015-08-20 ENCOUNTER — Other Ambulatory Visit: Payer: Self-pay | Admitting: Cardiovascular Disease

## 2015-08-20 ENCOUNTER — Emergency Department (HOSPITAL_COMMUNITY): Payer: Medicare Other

## 2015-08-20 ENCOUNTER — Encounter (HOSPITAL_COMMUNITY): Payer: Self-pay | Admitting: Emergency Medicine

## 2015-08-20 ENCOUNTER — Inpatient Hospital Stay (HOSPITAL_COMMUNITY): Payer: Medicare Other

## 2015-08-20 DIAGNOSIS — J9811 Atelectasis: Secondary | ICD-10-CM | POA: Diagnosis not present

## 2015-08-20 DIAGNOSIS — L8990 Pressure ulcer of unspecified site, unspecified stage: Secondary | ICD-10-CM

## 2015-08-20 DIAGNOSIS — Z8249 Family history of ischemic heart disease and other diseases of the circulatory system: Secondary | ICD-10-CM

## 2015-08-20 DIAGNOSIS — S31819A Unspecified open wound of right buttock, initial encounter: Secondary | ICD-10-CM | POA: Diagnosis present

## 2015-08-20 DIAGNOSIS — R140 Abdominal distension (gaseous): Secondary | ICD-10-CM

## 2015-08-20 DIAGNOSIS — S31829A Unspecified open wound of left buttock, initial encounter: Secondary | ICD-10-CM | POA: Diagnosis present

## 2015-08-20 DIAGNOSIS — I480 Paroxysmal atrial fibrillation: Secondary | ICD-10-CM | POA: Diagnosis present

## 2015-08-20 DIAGNOSIS — Z66 Do not resuscitate: Secondary | ICD-10-CM | POA: Diagnosis present

## 2015-08-20 DIAGNOSIS — Z96643 Presence of artificial hip joint, bilateral: Secondary | ICD-10-CM | POA: Diagnosis present

## 2015-08-20 DIAGNOSIS — R1110 Vomiting, unspecified: Secondary | ICD-10-CM | POA: Diagnosis not present

## 2015-08-20 DIAGNOSIS — Z885 Allergy status to narcotic agent status: Secondary | ICD-10-CM

## 2015-08-20 DIAGNOSIS — M25569 Pain in unspecified knee: Secondary | ICD-10-CM | POA: Diagnosis present

## 2015-08-20 DIAGNOSIS — I481 Persistent atrial fibrillation: Secondary | ICD-10-CM | POA: Diagnosis not present

## 2015-08-20 DIAGNOSIS — K5730 Diverticulosis of large intestine without perforation or abscess without bleeding: Secondary | ICD-10-CM | POA: Diagnosis present

## 2015-08-20 DIAGNOSIS — Z87891 Personal history of nicotine dependence: Secondary | ICD-10-CM

## 2015-08-20 DIAGNOSIS — R05 Cough: Secondary | ICD-10-CM

## 2015-08-20 DIAGNOSIS — N400 Enlarged prostate without lower urinary tract symptoms: Secondary | ICD-10-CM | POA: Diagnosis present

## 2015-08-20 DIAGNOSIS — N3281 Overactive bladder: Secondary | ICD-10-CM | POA: Diagnosis present

## 2015-08-20 DIAGNOSIS — Z9049 Acquired absence of other specified parts of digestive tract: Secondary | ICD-10-CM

## 2015-08-20 DIAGNOSIS — K219 Gastro-esophageal reflux disease without esophagitis: Secondary | ICD-10-CM | POA: Diagnosis present

## 2015-08-20 DIAGNOSIS — I4891 Unspecified atrial fibrillation: Secondary | ICD-10-CM | POA: Diagnosis not present

## 2015-08-20 DIAGNOSIS — Z8719 Personal history of other diseases of the digestive system: Secondary | ICD-10-CM

## 2015-08-20 DIAGNOSIS — Z0189 Encounter for other specified special examinations: Secondary | ICD-10-CM

## 2015-08-20 DIAGNOSIS — N183 Chronic kidney disease, stage 3 unspecified: Secondary | ICD-10-CM | POA: Diagnosis present

## 2015-08-20 DIAGNOSIS — I482 Chronic atrial fibrillation: Secondary | ICD-10-CM | POA: Diagnosis present

## 2015-08-20 DIAGNOSIS — R55 Syncope and collapse: Secondary | ICD-10-CM | POA: Diagnosis present

## 2015-08-20 DIAGNOSIS — R058 Other specified cough: Secondary | ICD-10-CM

## 2015-08-20 DIAGNOSIS — E871 Hypo-osmolality and hyponatremia: Secondary | ICD-10-CM | POA: Diagnosis not present

## 2015-08-20 DIAGNOSIS — Z950 Presence of cardiac pacemaker: Secondary | ICD-10-CM | POA: Diagnosis present

## 2015-08-20 DIAGNOSIS — I10 Essential (primary) hypertension: Secondary | ICD-10-CM | POA: Insufficient documentation

## 2015-08-20 DIAGNOSIS — K222 Esophageal obstruction: Secondary | ICD-10-CM

## 2015-08-20 DIAGNOSIS — M109 Gout, unspecified: Secondary | ICD-10-CM | POA: Diagnosis present

## 2015-08-20 DIAGNOSIS — E1122 Type 2 diabetes mellitus with diabetic chronic kidney disease: Secondary | ICD-10-CM | POA: Diagnosis present

## 2015-08-20 DIAGNOSIS — Z79899 Other long term (current) drug therapy: Secondary | ICD-10-CM | POA: Diagnosis not present

## 2015-08-20 DIAGNOSIS — R3914 Feeling of incomplete bladder emptying: Secondary | ICD-10-CM

## 2015-08-20 DIAGNOSIS — I441 Atrioventricular block, second degree: Secondary | ICD-10-CM | POA: Diagnosis present

## 2015-08-20 DIAGNOSIS — N401 Enlarged prostate with lower urinary tract symptoms: Secondary | ICD-10-CM | POA: Diagnosis present

## 2015-08-20 DIAGNOSIS — K567 Ileus, unspecified: Principal | ICD-10-CM | POA: Diagnosis present

## 2015-08-20 DIAGNOSIS — K56609 Unspecified intestinal obstruction, unspecified as to partial versus complete obstruction: Secondary | ICD-10-CM | POA: Insufficient documentation

## 2015-08-20 DIAGNOSIS — I1310 Hypertensive heart and chronic kidney disease without heart failure, with stage 1 through stage 4 chronic kidney disease, or unspecified chronic kidney disease: Secondary | ICD-10-CM | POA: Diagnosis present

## 2015-08-20 DIAGNOSIS — I495 Sick sinus syndrome: Secondary | ICD-10-CM | POA: Diagnosis present

## 2015-08-20 DIAGNOSIS — K5669 Other intestinal obstruction: Secondary | ICD-10-CM | POA: Diagnosis not present

## 2015-08-20 DIAGNOSIS — E538 Deficiency of other specified B group vitamins: Secondary | ICD-10-CM | POA: Diagnosis present

## 2015-08-20 DIAGNOSIS — Z7901 Long term (current) use of anticoagulants: Secondary | ICD-10-CM | POA: Diagnosis not present

## 2015-08-20 DIAGNOSIS — E860 Dehydration: Secondary | ICD-10-CM | POA: Diagnosis present

## 2015-08-20 DIAGNOSIS — E876 Hypokalemia: Secondary | ICD-10-CM | POA: Diagnosis not present

## 2015-08-20 DIAGNOSIS — L8992 Pressure ulcer of unspecified site, stage 2: Secondary | ICD-10-CM | POA: Insufficient documentation

## 2015-08-20 HISTORY — DX: Other allergy status, other than to drugs and biological substances: Z91.09

## 2015-08-20 HISTORY — DX: Personal history of other malignant neoplasm of skin: Z85.828

## 2015-08-20 HISTORY — DX: Unspecified intestinal obstruction, unspecified as to partial versus complete obstruction: K56.609

## 2015-08-20 HISTORY — DX: Gastro-esophageal reflux disease without esophagitis: K21.9

## 2015-08-20 LAB — CBC WITH DIFFERENTIAL/PLATELET
Basophils Absolute: 0 10*3/uL (ref 0.0–0.1)
Basophils Relative: 0 %
EOS ABS: 0 10*3/uL (ref 0.0–0.7)
Eosinophils Relative: 0 %
HCT: 39.6 % (ref 39.0–52.0)
HEMOGLOBIN: 13.4 g/dL (ref 13.0–17.0)
LYMPHS ABS: 0.8 10*3/uL (ref 0.7–4.0)
LYMPHS PCT: 17 %
MCH: 30.5 pg (ref 26.0–34.0)
MCHC: 33.8 g/dL (ref 30.0–36.0)
MCV: 90.2 fL (ref 78.0–100.0)
Monocytes Absolute: 0.7 10*3/uL (ref 0.1–1.0)
Monocytes Relative: 14 %
NEUTROS ABS: 3.3 10*3/uL (ref 1.7–7.7)
NEUTROS PCT: 69 %
Platelets: 179 10*3/uL (ref 150–400)
RBC: 4.39 MIL/uL (ref 4.22–5.81)
RDW: 13.9 % (ref 11.5–15.5)
WBC: 4.7 10*3/uL (ref 4.0–10.5)

## 2015-08-20 LAB — BASIC METABOLIC PANEL
Anion gap: 11 (ref 5–15)
BUN: 24 mg/dL — AB (ref 6–20)
CHLORIDE: 92 mmol/L — AB (ref 101–111)
CO2: 24 mmol/L (ref 22–32)
Calcium: 8.7 mg/dL — ABNORMAL LOW (ref 8.9–10.3)
Creatinine, Ser: 1.37 mg/dL — ABNORMAL HIGH (ref 0.61–1.24)
GFR calc Af Amer: 50 mL/min — ABNORMAL LOW (ref >60)
GFR calc non Af Amer: 43 mL/min — ABNORMAL LOW (ref >60)
Glucose, Bld: 136 mg/dL — ABNORMAL HIGH (ref 65–99)
POTASSIUM: 3.5 mmol/L (ref 3.5–5.1)
SODIUM: 127 mmol/L — AB (ref 135–145)

## 2015-08-20 LAB — LIPASE, BLOOD: LIPASE: 11 U/L — AB (ref 22–51)

## 2015-08-20 LAB — HEPATIC FUNCTION PANEL
ALBUMIN: 3.6 g/dL (ref 3.5–5.0)
ALT: 17 U/L (ref 17–63)
AST: 33 U/L (ref 15–41)
Alkaline Phosphatase: 39 U/L (ref 38–126)
BILIRUBIN TOTAL: 1 mg/dL (ref 0.3–1.2)
Bilirubin, Direct: 0.3 mg/dL (ref 0.1–0.5)
Indirect Bilirubin: 0.7 mg/dL (ref 0.3–0.9)
Total Protein: 6.2 g/dL — ABNORMAL LOW (ref 6.5–8.1)

## 2015-08-20 LAB — HEPARIN LEVEL (UNFRACTIONATED): Heparin Unfractionated: >2.2 IU/mL — ABNORMAL HIGH (ref 0.30–0.70)

## 2015-08-20 LAB — APTT: aPTT: 45 seconds — ABNORMAL HIGH (ref 24–37)

## 2015-08-20 LAB — TROPONIN I: TROPONIN I: 0.04 ng/mL — AB (ref <0.031)

## 2015-08-20 MED ORDER — MORPHINE SULFATE (PF) 2 MG/ML IV SOLN: 1.0000 mg | INTRAVENOUS | Status: DC | PRN

## 2015-08-20 MED ORDER — PANTOPRAZOLE SODIUM 40 MG IV SOLR
40.0000 mg | INTRAVENOUS | Status: DC
  Administered 2015-08-20: 40 mg via INTRAVENOUS
  Filled 2015-08-20: qty 40

## 2015-08-20 MED ORDER — SODIUM CHLORIDE 0.9 % IJ SOLN
3.0000 mL | Freq: Two times a day (BID) | INTRAMUSCULAR | Status: DC
  Administered 2015-08-22: 3 mL via INTRAVENOUS

## 2015-08-20 MED ORDER — METOPROLOL TARTRATE 1 MG/ML IV SOLN
2.5000 mg | Freq: Two times a day (BID) | INTRAVENOUS | Status: DC
  Administered 2015-08-20 – 2015-08-21 (×3): 2.5 mg via INTRAVENOUS
  Filled 2015-08-20 (×3): qty 5

## 2015-08-20 MED ORDER — ONDANSETRON HCL 4 MG/2ML IJ SOLN: 4.0000 mg | Freq: Three times a day (TID) | INTRAMUSCULAR | Status: DC | PRN

## 2015-08-20 MED ORDER — SODIUM CHLORIDE 0.9 % IV SOLN
Freq: Once | INTRAVENOUS | Status: DC
  Administered 2015-08-20: 06:00:00 via INTRAVENOUS

## 2015-08-20 MED ORDER — SODIUM CHLORIDE 0.9 % IJ SOLN: 3.0000 mL | INTRAMUSCULAR | Status: DC | PRN

## 2015-08-20 MED ORDER — DIATRIZOATE MEGLUMINE & SODIUM 66-10 % PO SOLN
ORAL | Status: AC
  Filled 2015-08-20: qty 90

## 2015-08-20 MED ORDER — WHITE PETROLATUM GEL
Status: AC
  Administered 2015-08-20: 22:00:00
  Filled 2015-08-20: qty 1

## 2015-08-20 MED ORDER — SODIUM CHLORIDE 0.9 % IJ SOLN
3.0000 mL | Freq: Two times a day (BID) | INTRAMUSCULAR | Status: DC
  Administered 2015-08-21 – 2015-08-30 (×8): 3 mL via INTRAVENOUS

## 2015-08-20 MED ORDER — SODIUM CHLORIDE 0.9 % IV SOLN: 250.0000 mL | INTRAVENOUS | Status: DC | PRN

## 2015-08-20 MED ORDER — DIATRIZOATE MEGLUMINE & SODIUM 66-10 % PO SOLN
90.0000 mL | Freq: Once | ORAL | Status: DC
Start: 2015-08-20 — End: 2015-08-21

## 2015-08-20 MED ORDER — DEXTROSE-NACL 5-0.9 % IV SOLN
INTRAVENOUS | Status: DC
  Administered 2015-08-20 – 2015-08-22 (×4): via INTRAVENOUS

## 2015-08-20 MED ORDER — ONDANSETRON HCL 4 MG/2ML IJ SOLN
4.0000 mg | Freq: Once | INTRAMUSCULAR | Status: DC
  Administered 2015-08-20: 4 mg via INTRAVENOUS
  Filled 2015-08-20: qty 2

## 2015-08-20 MED ORDER — HEPARIN (PORCINE) IN NACL 100-0.45 UNIT/ML-% IJ SOLN
1100.0000 [IU]/h | INTRAMUSCULAR | Status: DC
  Administered 2015-08-20: 1250 [IU]/h via INTRAVENOUS
  Administered 2015-08-21: 1100 [IU]/h via INTRAVENOUS
  Filled 2015-08-20 (×3): qty 250

## 2015-08-20 NOTE — ED Notes (Addendum)
Daughter-in-law, Dawayne Cirri  [phone] Home) (Cell [phone])

## 2015-08-20 NOTE — Care Management Note (Signed)
Case Management Note  Patient Details  Name: [name] MRN: [id number] Date of Birth: 05/03/1920  Subjective/Objective:                    Action/Plan:  Initial UR completed  Expected Discharge Date:                  Expected Discharge Plan:  Home/Self Care  In-House Referral:     Discharge planning Services     Post Acute Care Choice:    Choice offered to:     DME Arranged:    DME Agency:     HH Arranged:    [place] Agency:     Status of Service:  In process, will continue to follow  Medicare Important Message Given:    Date Medicare IM Given:    Medicare IM give by:    Date Additional Medicare IM Given:    Additional Medicare Important Message give by:     If discussed at [place] of Stay Meetings, dates discussed:    Additional Comments:  Marilu Favre, RN 08/20/2015, 2:26 PM

## 2015-08-20 NOTE — H&P (Signed)
[facility] Admission History and Physical Service Pager: [phone]  Patient name: [name] Medical record number: [id number] Date of birth: 20-Nov-1919 Age: 81 y.o. Gender: male  Primary Care Provider: [name], MD Consultants: Pharmacy Code Status: DNR  Chief Complaint: Nausea and vomiting  Assessment and Plan: [name] is a 81 y.o. male presenting with nausea and vomiting with radiographic evidence of ileus. PMH is significant for history of ilues, appendectomy, esophageal stricture, diverticulosis, DNR, AFib on xarelto with PPM, stage III CKD, and BPH.  Ileus: LBM 10/1 AM with air-fluid levels in small bowel on CT scan.  - Place NGT to LIS and initiate gastrografin protocol. Follow up NG output/symptoms and obtain abd XR 8 hours after gastrografin administration to assess severity and likelihood of resolution without surgical management.  - Zofran 4mg  IV q8h prn nausea - Very low dose morphine IV prn abdominal pain - Maintenance IVF - IV PPI replacing home PPI  BPH: On rapaflo with continued severe nocturia symptoms. Should restart this at high dose instead of formulary alternative as he has failed flomax in the past. No hydronephrosis on CT, but concern for urinary retention given ongoing IVF. Discontinue elavil, though he denies taking this.  - Strict I/O - Bladder scan for low UOP and place foley for PVR > 300cc  Stage III CKD: At/below historical baseline, inconsistent with significant dehydration from poor po x 2 days. Holding ACE inhibitor due to NPO. Continue maintenance IVF's and advance diet slowly as able after resolution of ileus.  - MIVF  Hyponatremia: Mild, correct gradually with isotonic IVF.  - Monitor BMP daily  Permanent atrial fibrillation/HTN: s/p cardioversion, now with PPM in situ, on dronedarone and bystolic. Followed by Dr. [name]. Troponin neg x3. ECG shows dual paced rhythm so offers little insight into cardiac  ischemia. Last echo was 2006 with preserved EF and currently euvolemic.  - Replace nebivolol with metoprolol 2.5mg  IV BID and hold dronedarone as no IV equivalent is available.  - Restart home po meds as able - Heparin per pharmacy in lieu of xarelto while NPO  Esophageal stricture/dysmotility: Not thought to be contributing to presentation. Followed by Dr. Hilarie Fredrickson, last EGD with dilation Nov 2015 otherwise noted to be normal. - No intervention planned.   FEN/GI: NPO, D5NS at 100 cc/hr Prophylaxis: Heparin per pharmacy for therapeutic anticoagulation until we can transition back to xarelto.  Disposition: Admit to FMTS, Dr. Ardelia Mems attending, on telemetry unit for management of ileus.  History of Present Illness:  [name] is a 81 y.o. male presenting with nausea and vomiting.  [name] has had gradually worsening nausea and a few episodes of nonbloody, nonbilious emesis for the past 3 days associated with waxing/waning, moderate, poorly localized abdominal/epigastric pain that seems unrelated to food/drink/position. For these symptoms, he has taken no medications other than chronic medications, last taken yesterday evening. He endorses poor po intake. Last BM was loose but normal in color approximately 48 hours ago and he has since felt bloated with very limited flatus.   He has a history of appendectomy and hospitalizations for SBO which only once required surgery. dizziness and has fallen once recently at home which resulted in no injury/LOC, for which he was seen and discharged from the ED 2 days ago. He denies recent or current chest pain, shortness of breath, change in his usual 2 pillow orthopnea, PND, or LE swelling. He endorses intermittent heartburn worse when recumbent which has been better with  protonix. No headache, vision changes, hearing changes (stable presbycusis), cough, wheezing, changes in urinary symptoms (4 -5 episodes of nocturia every night which has been stable),  dysuria, muscle or joint aches, or rashes.   He is retired, lives alone since his wife's passing about 8 years ago, performs all ADLs and iADLs independently, though prefers not to cook but to drive to the nearby [employer] daily instead. Has step-son and daughter-in-law nearby who are helpful. Stopped smoking (~20 pack years) in 1971. Stopped drinking in 1989 because he was drinking too much. No other drugs or recent medication changes. Able to discuss all his medications and their administration accurately and does not recognize taking elavil/amitriptyline.   Review Of Systems: Per HPI  Otherwise 12 point review of systems was performed and was unremarkable.  Patient Active Problem List   Diagnosis Date Noted  . Ileus ([place]) 08/20/2015  . Otitis, externa, infective 06/12/2015  . Gynecomastia, male 05/16/2015  . Abdominal pain 04/24/2015  . Shortness of breath on exertion 04/24/2015  . Second degree AV block 11/29/2014  . UTI (urinary tract infection) 09/15/2014  . Dysphagia, pharyngoesophageal phase 09/07/2014  . Abnormal esophagram 09/07/2014  . Esophageal stricture 09/07/2014  . Dysphagia 08/23/2014  . Symptomatic sinus bradycardia 03/14/2014  . Pacemaker syndrome 03/14/2014  . Volume overload 03/07/2014  . Hyperkalemia 03/03/2014  . Hypotension, unspecified 03/02/2014  . Hematuria, gross 12/29/2013  . Acute abdominal pain in right lower quadrant 08/31/2013  . Pacemaker - Medtronic Adapta 2006, gen change 2015 07/23/2013  . Chronic right hip pain 09/22/2012  . Testicular swelling, right 09/22/2012  . Presbycusis 05/14/2012  . Hepatomegaly 01/30/2012  . Anticoagulant long-term use 01/30/2012  . AF (atrial fibrillation) ([place]) 01/30/2012  . Hx SBO 01/30/2012  . S/P appendectomy 01/30/2012  . CHRONIC KIDNEY DISEASE STAGE III (MODERATE) 12/03/2010  . ALLERGIC RHINITIS 09/05/2009  . DYSPHAGIA UNSPECIFIED 05/29/2009  . HOARSENESS 05/15/2009  . HIP PAIN, LEFT 04/11/2009  .  HIATAL HERNIA 12/14/2008  . Diverticulosis of large intestine 12/14/2008  . COLONIC POLYPS, HYPERPLASTIC, HX OF 12/14/2008  . ESOPHAGEAL STRICTURE 10/04/2008  . Other vitamin B12 deficiency anemia 01/26/2008  . GLAUCOMA 01/14/2007  . CATARACT 01/14/2007  . HYPERTENSION, BENIGN SYSTEMIC 01/14/2007  . SICK SINUS SYNDROME 01/14/2007  . GASTROESOPHAGEAL REFLUX, NO ESOPHAGITIS 01/14/2007  . Benign prostatic hypertrophy (BPH) with incomplete bladder emptying 01/14/2007   Past Medical History: Past Medical History  Diagnosis Date  . Overactive bladder   . Arthritis   . Diverticulosis of colon (without mention of hemorrhage)   . Gout   . Chronic kidney disease, stage III (moderate)   . Family history of malignant neoplasm of gastrointestinal tract   . Stricture and stenosis of esophagus   . Acute gastritis without mention of hemorrhage   . Unspecified cataract   . Hypertrophy of prostate with urinary obstruction and other lower urinary tract symptoms (LUTS)   . Personal history of colonic polyps 10/21/2006    hyperplastic   . Vitamin B12 deficiency   . Hiatal hernia   . Type II or unspecified type diabetes mellitus without mention of complication, not stated as uncontrolled     pt states that he checks his blood sugar once weekly, is not on oral meds or insulin  . Atrial fibrillation ([place])   . Sinus node dysfunction ([facility])   . Presence of permanent cardiac pacemaker 10/14/05    medtronic Adapta  . Hypertension   . Esophageal dysmotility   . Second degree AV  block 11/29/2014   Past Surgical History: Past Surgical History  Procedure Laterality Date  . Appendectomy    . Tonsillectomy    . Pacemaker insertion  replaced 2 months ago, total of 8 years with pacemaker  . Total hip arthroplasty Bilateral   . Cardioversion  10/01/2011    Procedure: CARDIOVERSION;  Surgeon: Dani Gobble [name];  Location: [facility] OR;  Service: Cardiovascular;  Laterality: N/A;  OP CARDIOVERSION  . Cardioversion   11/02/12    successful  . Eye surgery Bilateral years ago    cataract lens replacment  . Esophagogastroduodenoscopy (egd) with propofol N/A 09/22/2014    Procedure: ESOPHAGOGASTRODUODENOSCOPY (EGD) WITH PROPOFOL;  Surgeon: Jerene Bears, MD;  Location: [facility] END[facility];  Service: Gastroenterology;  Laterality: N/A;  . Savory dilation N/A 09/22/2014    Procedure: SAVORY DILATION;  Surgeon: Jerene Bears, MD;  Location: [facility] ENDOSCOPY;  Service: Gastroenterology;  Laterality: N/A;  . Permanent pacemaker generator change N/A 03/14/2014    Procedure: PERMANENT PACEMAKER GENERATOR CHANGE;  Surgeon: Sanda Klein, MD;  Location: [facility];  Service: Cardiovascular;  Laterality: N/A;   Social History: Social History  Substance Use Topics  . Smoking status: Former Smoker -- 1.00 packs/day for 20 years    Types: Cigarettes    Quit date: 11/17/1969  . Smokeless tobacco: Never Used  . Alcohol Use: No     Comment: none since 1990   Please also refer to relevant sections of EMR.  Family History: Family History  Problem Relation Age of Onset  . Colon cancer Brother 78  . Hypertension Brother   . Hypertension Father   . Parkinsonism Mother   . Hodgkin's lymphoma Grandchild    Allergies and Medications: Allergies  Allergen Reactions  . Codeine     REACTION: nausea   No current facility-administered medications on file prior to encounter.   Current Outpatient Prescriptions on File Prior to Encounter  Medication Sig Dispense Refill  . beta carotene w/minerals (OCUVITE) tablet Take 1 tablet by mouth every morning.     . ciprofloxacin-hydrocortisone (CIPRO HC) otic suspension Place 3 drops into the right ear 2 (two) times daily. 10 mL 0  . dronedarone (MULTAQ) 400 MG tablet Take 1 tablet (400 mg total) by mouth 2 (two) times daily with a meal. 180 tablet 3  . nebivolol (BYSTOLIC) 10 MG tablet Take 1 tablet (10 mg total) by mouth every morning. 90 tablet 3  . OVER THE COUNTER MEDICATION Place 1 each  into both ears as needed (for allergies.). Ear drops.    . pantoprazole (PROTONIX) 40 MG tablet Take 1 tablet (40 mg total) by mouth daily before breakfast. 90 tablet 2  . Probiotic Product (PHILLIPS COLON HEALTH PO) Take 1 tablet by mouth every morning.     . ramipril (ALTACE) 2.5 MG capsule Take 1 capsule (2.5 mg total) by mouth every morning. 90 capsule 3  . Rivaroxaban (XARELTO) 15 MG TABS tablet Take 1 tablet (15 mg total) by mouth at bedtime. 90 tablet 3  . silodosin (RAPAFLO) 4 MG CAPS capsule Take 1 capsule (4 mg total) by mouth daily with breakfast. 90 capsule 3  . traMADol (ULTRAM) 50 MG tablet Take 1 tablet (50 mg total) by mouth every 8 (eight) hours as needed for moderate pain. 90 tablet 3    Objective: BP 132/53 mmHg  Pulse 71  Temp(Src) 98.3 F (36.8 C) (Oral)  Resp 18  SpO2 96% Exam: General: Elderly, pleasant gentleman found laying flat in stretcher snoring very  loudly Eyes: Pupils small but reactive with right-side coloboma.  ENTM: MMM. Poor dentition with absent upper dentition, not wearing partial upper dentures. Dried blood on left inner cheek explained by patient as ongoing from biting his cheek with partial denture. Neck: Supple, large diameter, no JVD Cardiovascular: Regular paced rhythm on monitor without murmur, rub or gallop on auscultation. No LE edema. distal pulses (radial/DP) 1+. Respiratory: Nonlabored on room air, clear breath sounds Abdomen: Soft but distended without tenderness, rebound or guarding. Suprapubic area nontender without fullness.  MSK: No deformities, mild atrophy Skin: Scattered cherry hemangiomas on abdomen, linear surgical scar in RLQ. No jaundice, wounds or rashes. Neuro: Alert and oriented, proximal and distal extremity muscle groups 5/5 without deficits in sensation. Speech without aphasia.  Psych: Normal  Labs and Imaging: CBC BMET   Recent Labs Lab 08/20/15 0525  WBC 4.7  HGB 13.4  HCT 39.6  PLT 179    Recent Labs Lab  08/20/15 0525  NA 127*  K 3.5  CL 92*  CO2 24  BUN 24*  CREATININE 1.37*  GLUCOSE 136*  CALCIUM 8.7*     Lipase: 11 ECG: Dual paced rhythm  Ct Abdomen Pelvis Wo Contrast  08/18/2015   CLINICAL DATA:  Acute epigastric abdominal pain for 2 days. Syncope.  EXAM: CT ABDOMEN AND PELVIS WITHOUT CONTRAST  TECHNIQUE: Multidetector CT imaging of the abdomen and pelvis was performed following the standard protocol without IV contrast.  COMPARISON:  08/31/2014, 01/05/2014  FINDINGS: Lower chest: Pacemaker in the rest of the heart. Heart is enlarged. Thoracic aortic atherosclerosis and coronary calcifications noted. No pericardial or pleural effusion. No significant hiatal hernia.  Lower thoracic degenerative osteophytes on the right.  Minor basilar scarring.  Otherwise lung bases clear.  Abdomen: Liver, gallbladder, biliary system, atrophic fatty replaced pancreas, and adrenal glands are within normal limits for age and noncontrast imaging. Spleen demonstrates scattered calcifications from granulomatous disease. Small accessory splenules in the left upper quadrant.  Kidneys demonstrate no acute obstruction, hydronephrosis, perinephric inflammatory process, or obstructing ureteral calculus on either side.  Aortic atherosclerosis noted without aneurysm or retroperitoneal hemorrhage.  Mild to moderate distention of the stomach and proximal small bowel with contrast and air. Distal small bowel is collapsed however there is air in the colon. Colonic diverticulosis noted. Appearance of the bowel is nonspecific.  No abdominal free fluid, fluid collection, hemorrhage, abscess, or adenopathy.  Pelvis: Diverticulosis of the descending colon and sigmoid. No acute inflammatory process. No pelvic free fluid, fluid collection, hemorrhage, abscess, adenopathy, inguinal abnormality, or hernia. Urinary bladder unremarkable. No acute distal bowel process.  Left hip replacement noted creating artifact. Degenerative changes of the  right hip, pelvis and spine.  IMPRESSION: Gastric and proximal small bowel distention with air and contrast, nonspecific. No definite obstruction pattern. Negative for free air.  No acute obstructing urinary tract or ureteral calculus. No hydronephrosis or obstructive uropathy  Abdominal atherosclerosis  Colonic diverticulosis without acute inflammation   Electronically Signed   By: Jerilynn Mages.  Shick M.D.   On: 08/18/2015 14:30   Dg Abd Acute W/chest  08/20/2015   CLINICAL DATA:  Epigastric pain, nausea and vomiting and fever for 3 days. History of diverticulosis, chronic kidney disease, hypertension.  EXAM: DG ABDOMEN ACUTE W/ 1V CHEST  COMPARISON:  CT abdomen pelvis August 18, 2015  FINDINGS: The cardiac silhouette is mildly enlarged, mediastinal silhouette is nonsuspicious, mildly calcified aortic knob. Strandy densities LEFT lung base. Blunting of the RIGHT costophrenic angle. No focal consolidation. No  pneumothorax. Dual lead LEFT cardiac pacemaker with lead tips projecting RIGHT atrium and RIGHT ventricle. No pneumothorax. Soft tissue planes and included osseous structure nonsuspicious. Mild degenerative change of thoracic spine.  Scattered air-fluid levels throughout the abdomen, with gas distended small bowel, moderate amount of air, stool and contrast in the large bowel. No free air. No intra-abdominal mass effect or pathologic calcifications. No free air. Moderate degenerative change of the lumbar spine. LEFT hip total arthroplasty partially imaged severe degenerative change of the RIGHT hip.  IMPRESSION: Mild cardiomegaly. Small RIGHT pleural effusion versus pleural thickening. LEFT lung base atelectasis.  Small bowel distention with air-fluid levels. Moderate amount of stool and air in the large bowel; findings suggest ileus.   Electronically Signed   By: Elon Alas M.D.   On: 08/20/2015 06:03    Patrecia Pour, MD 08/20/2015, 8:32 AM PGY-3, [facility] Intern pager: [phone],  text pages welcome

## 2015-08-20 NOTE — ED Notes (Signed)
Patient arrived via [facility].  EMS reports: patient/daughter report that patient has experienced nausea/vomiting/diarrhea x 48 hours. Reports patient seen in ED 2 days ago. Zofran 4 mg administered by EMS. 20 gauge IV. VSS. BP 132/70, Pulse 94, SPO2 98% room air. CBG 164.

## 2015-08-20 NOTE — Progress Notes (Signed)
Family Medicine Teaching Service Daily Progress Note Intern Pager: [phone]  Patient name: [name] Medical record number: [id number] Date of birth: 03/15/1920 Age: 81 y.o. Gender: male  Primary Care Provider: [name], MD Consultants: None Code Status: DNR  Pt Overview and Major Events to Date:  10/1: CT Abdomen Pelvis 10/3: DG Abd Acute w/Chest  Assessment and Plan: [name] is a 81 y.o. male presenting with nausea and vomiting with radiographic evidence of ileus. PMH is significant for history of ilues, appendectomy, esophageal stricture, diverticulosis, DNR, AFib on xarelto with PPM, stage III CKD, and BPH.  Ileus, resolved: LBM 10/3 4AM, passing some flatus. Pain, N/V resolved. - hold NGT, CTM overnight, consider advancing diet tomorrow - air-fluid levels in small bowel on CT scan. - gastrografin protocol initiated. [ ]  abd XR 8 hours after gastrografin administration to assess severity and likelihood of resolution without surgical management.  - Zofran 4mg  IV q8h prn nausea - Very low dose morphine IV prn abdominal pain - Maintenance IVF - IV PPI replacing home PPI  BPH: On rapaflo with continued severe nocturia symptoms. Should restart this at high dose instead of formulary alternative as he has failed flomax in the past. No hydronephrosis on CT, but concern for urinary retention given ongoing IVF. Discontinue elavil, though he denies taking this.  - Strict I/O - Bladder scan for low UOP and place foley for PVR > 300cc  Stage III CKD: At/below historical baseline, inconsistent with significant dehydration from poor po x 2 days. Holding ACE inhibitor due to NPO. Continue maintenance IVF's and advance diet slowly as able after resolution of ileus.  - MIVF  Hyponatremia: Mild, correct gradually with isotonic IVF.  - Na 136-->127 - Monitor BMP daily  Permanent atrial fibrillation/HTN: s/p cardioversion, now with PPM in situ, on dronedarone and bystolic.  Followed by Dr. [name]. Troponin neg x3. ECG shows dual paced rhythm so offers little insight into cardiac ischemia. Last echo was 2006 with preserved EF and currently euvolemic.  - Replace nebivolol with metoprolol 2.5mg  IV BID and hold dronedarone as no IV equivalent is available.  - Restart home po meds as able - Heparin per pharmacy in lieu of xarelto while NPO  Esophageal stricture/dysmotility: Not thought to be contributing to presentation. Followed by Dr. Hilarie Fredrickson, last EGD with dilation Nov 2015 otherwise noted to be normal. - No intervention planned.   FEN/GI: NPO, D5NS at 100 cc/hr Prophylaxis: Heparin per pharmacy for therapeutic anticoagulation until we can transition back to xarelto.  Disposition: Home  Subjective:  Team went up to see patient this afternoon to place NG tube; however patient reports that he feels well, nausea, vomiting, and pain has resolved. Patient states that he had one BM this morning at 4am and also has passed some flatus.  Objective: Temp:  [98 F (36.7 C)-98.3 F (36.8 C)] 98 F (36.7 C) (10/03 0842) Pulse Rate:  [69-80] 72 (10/03 0842) Resp:  [12-19] 18 (10/03 0842) BP: (111-132)/(48-63) 132/56 mmHg (10/03 0842) SpO2:  [88 %-98 %] 95 % (10/03 0842) Physical Exam: General: Alert, comfortably sitting in bed Cardiovascular: RRR Respiratory: CTAB Abdomen: sparse bowel sounds, soft, nontender, nondistended but obese abdomen  Laboratory:  Recent Labs Lab 08/18/15 1026 08/20/15 0525  WBC 8.4 4.7  HGB 13.8 13.4  HCT 41.5 39.6  PLT 163 179    Recent Labs Lab 08/18/15 1026 08/20/15 0525  NA 136 127*  K 5.2* 3.5  CL 102 92*  CO2 26 24  BUN 19 24*  CREATININE 1.48* 1.37*  CALCIUM 9.2 8.7*  PROT 6.4* 6.2*  BILITOT 0.9 1.0  ALKPHOS 46 39  ALT 15* 17  AST 19 33  GLUCOSE 154* 136*    Imaging/Diagnostic Tests: DG ABDOMEN ACUTE W/ 1V CHEST: Small bowel distention with air-fluid levels. Moderate amount of stool and air in the large  bowel; findings suggest ileus.  CT Abd Pelvis wo contrast: Gastric and proximal small bowel distention with air and contrast, nonspecific. No definite obstruction pattern. Negative for free air.  [name], Med Student 08/20/2015, 4:44 PM

## 2015-08-20 NOTE — ED Provider Notes (Signed)
CSN: [id number]     Arrival date & time 08/20/15  0404 History   First MD Initiated Contact with Patient 08/20/15 0435     Chief Complaint  Patient presents with  . Emesis  . Nausea     (Consider location/radiation/quality/duration/timing/severity/associated sxs/prior Treatment) Patient is a 81 y.o. male presenting with vomiting.  Emesis Severity:  Severe Duration:  2 days Timing:  Constant Quality:  Bilious material Progression:  Unchanged Chronicity:  New Context comment:  Seen in the ED two days ago after syncope Relieved by:  Nothing Worsened by:  Nothing tried Associated symptoms: abdominal pain (initially, none now), chills, diarrhea (2 days ago, no BM since) and fever (subjetive)     Past Medical History  Diagnosis Date  . Overactive bladder   . Arthritis   . Diverticulosis of colon (without mention of hemorrhage)   . Gout   . Chronic kidney disease, stage III (moderate)   . Family history of malignant neoplasm of gastrointestinal tract   . Stricture and stenosis of esophagus   . Acute gastritis without mention of hemorrhage   . Unspecified cataract   . Hypertrophy of prostate with urinary obstruction and other lower urinary tract symptoms (LUTS)   . Personal history of colonic polyps 10/21/2006    hyperplastic   . Vitamin B12 deficiency   . Hiatal hernia   . Type II or unspecified type diabetes mellitus without mention of complication, not stated as uncontrolled     pt states that he checks his blood sugar once weekly, is not on oral meds or insulin  . Atrial fibrillation ([place])   . Sinus node dysfunction (HCC)   . Presence of permanent cardiac pacemaker 10/14/05    medtronic Adapta  . Hypertension   . Esophageal dysmotility   . Second degree AV block 11/29/2014   Past Surgical History  Procedure Laterality Date  . Appendectomy    . Tonsillectomy    . Pacemaker insertion  replaced 2 months ago, total of 8 years with pacemaker  . Total hip arthroplasty  Bilateral   . Cardioversion  10/01/2011    Procedure: CARDIOVERSION;  Surgeon: Dani Gobble [name];  Location: [facility] OR;  Service: Cardiovascular;  Laterality: N/A;  OP CARDIOVERSION  . Cardioversion  11/02/12    successful  . Eye surgery Bilateral years ago    cataract lens replacment  . Esophagogastroduodenoscopy (egd) with propofol N/A 09/22/2014    Procedure: ESOPHAGOGASTRODUODENOSCOPY (EGD) WITH PROPOFOL;  Surgeon: Jerene Bears, MD;  Location: [facility] ENDOSCOPY;  Service: Gastroenterology;  Laterality: N/A;  . Savory dilation N/A 09/22/2014    Procedure: SAVORY DILATION;  Surgeon: Jerene Bears, MD;  Location: [facility] ENDOSCOPY;  Service: Gastroenterology;  Laterality: N/A;  . Permanent pacemaker generator change N/A 03/14/2014    Procedure: PERMANENT PACEMAKER GENERATOR CHANGE;  Surgeon: Sanda Klein, MD;  Location: [facility] CATH LAB;  Service: Cardiovascular;  Laterality: N/A;   Family History  Problem Relation Age of Onset  . Colon cancer Brother 20  . Hypertension Brother   . Hypertension Father   . Parkinsonism Mother   . Hodgkin's lymphoma Grandchild    Social History  Substance Use Topics  . Smoking status: Former Smoker -- 1.00 packs/day for 20 years    Types: Cigarettes    Quit date: 11/17/1969  . Smokeless tobacco: Never Used  . Alcohol Use: No     Comment: none since 1990    Review of Systems  Constitutional: Positive for chills.  Gastrointestinal: Positive for vomiting, abdominal  pain (initially, none now) and diarrhea (2 days ago, no BM since).  All other systems reviewed and are negative.     Allergies  Codeine  Home Medications   Prior to Admission medications   Medication Sig Start Date End Date Taking? Authorizing Provider  amitriptyline (ELAVIL) 10 MG tablet Take 1 tablet (10 mg total) by mouth at bedtime as needed for sleep. 12/06/14   Dickie La, MD  beta carotene w/minerals (OCUVITE) tablet Take 1 tablet by mouth every morning.     Historical Provider, MD   ciprofloxacin-hydrocortisone (CIPRO HC) otic suspension Place 3 drops into the right ear 2 (two) times daily. 06/12/15   Olam Idler, MD  dronedarone (MULTAQ) 400 MG tablet Take 1 tablet (400 mg total) by mouth 2 (two) times daily with a meal. 07/16/15   Mihai [name], MD  nebivolol (BYSTOLIC) 10 MG tablet Take 1 tablet (10 mg total) by mouth every morning. 03/01/15   Dickie La, MD  OVER THE COUNTER MEDICATION Place 1 each into both ears as needed (for allergies.). Ear drops.    Historical Provider, MD  pantoprazole (PROTONIX) 40 MG tablet Take 1 tablet (40 mg total) by mouth daily before breakfast. 11/28/14   Jerene Bears, MD  pantoprazole (PROTONIX) 40 MG tablet Take 1 tablet (40 mg total) by mouth daily before breakfast. 11/28/14   Jerene Bears, MD  Probiotic Product ([place] PO) Take 1 tablet by mouth every morning.     Historical Provider, MD  ramipril (ALTACE) 2.5 MG capsule Take 1 capsule (2.5 mg total) by mouth every morning. 06/29/15   Dickie La, MD  Rivaroxaban (XARELTO) 15 MG TABS tablet Take 1 tablet (15 mg total) by mouth at bedtime. 04/24/15   Renee A Kuneff, DO  silodosin (RAPAFLO) 4 MG CAPS capsule Take 1 capsule (4 mg total) by mouth daily with breakfast. 12/12/14   Dickie La, MD  sulfamethoxazole-trimethoprim (BACTRIM) 400-80 MG per tablet Take 1 tablet by mouth 2 (two) times daily. 05/23/15   Dickie La, MD  traMADol (ULTRAM) 50 MG tablet Take 1 tablet (50 mg total) by mouth every 8 (eight) hours as needed for moderate pain. 03/30/15   Dickie La, MD   BP 113/49 mmHg  Pulse 70  Temp(Src) 98.3 F (36.8 C) (Oral)  Resp 19  SpO2 98% Physical Exam  Constitutional: He is oriented to person, place, and time. He appears well-developed and well-nourished.  Elderly, appears ill but not toxic  HENT:  Head: Normocephalic and atraumatic.  Mouth/Throat: Mucous membranes are dry. No posterior oropharyngeal erythema.  Eyes: Conjunctivae are normal. Pupils are equal,  round, and reactive to light. No scleral icterus.  Neck: Neck supple.  Cardiovascular: Normal rate, regular rhythm, normal heart sounds and intact distal pulses.   No murmur heard. Pulmonary/Chest: Effort normal and breath sounds normal. No stridor. No respiratory distress. He has no wheezes. He has no rales.  Abdominal: Soft. He exhibits distension. There is no tenderness. There is no rigidity, no rebound and no guarding.  Musculoskeletal: Normal range of motion. He exhibits no edema.  Neurological: He is alert and oriented to person, place, and time.  Skin: Skin is warm and dry. No rash noted.  Psychiatric: He has a normal mood and affect. His behavior is normal.  Nursing note and vitals reviewed.   ED Course  Procedures (including critical care time) Labs Review Labs Reviewed  BASIC METABOLIC PANEL - Abnormal; Notable for the  following:    Sodium 127 (*)    Chloride 92 (*)    Glucose, Bld 136 (*)    BUN 24 (*)    Creatinine, Ser 1.37 (*)    Calcium 8.7 (*)    GFR calc non Af Amer 43 (*)    GFR calc Af Amer 50 (*)    All other components within normal limits  LIPASE, BLOOD - Abnormal; Notable for the following:    Lipase 11 (*)    All other components within normal limits  TROPONIN I - Abnormal; Notable for the following:    Troponin I 0.04 (*)    All other components within normal limits  HEPATIC FUNCTION PANEL - Abnormal; Notable for the following:    Total Protein 6.2 (*)    All other components within normal limits  CBC WITH DIFFERENTIAL/PLATELET    Imaging Review Ct Abdomen Pelvis Wo Contrast  08/18/2015   CLINICAL DATA:  Acute epigastric abdominal pain for 2 days. Syncope.  EXAM: CT ABDOMEN AND PELVIS WITHOUT CONTRAST  TECHNIQUE: Multidetector CT imaging of the abdomen and pelvis was performed following the standard protocol without IV contrast.  COMPARISON:  08/31/2014, 01/05/2014  FINDINGS: Lower chest: Pacemaker in the rest of the heart. Heart is enlarged. Thoracic  aortic atherosclerosis and coronary calcifications noted. No pericardial or pleural effusion. No significant hiatal hernia.  Lower thoracic degenerative osteophytes on the right.  Minor basilar scarring.  Otherwise lung bases clear.  Abdomen: Liver, gallbladder, biliary system, atrophic fatty replaced pancreas, and adrenal glands are within normal limits for age and noncontrast imaging. Spleen demonstrates scattered calcifications from granulomatous disease. Small accessory splenules in the left upper quadrant.  Kidneys demonstrate no acute obstruction, hydronephrosis, perinephric inflammatory process, or obstructing ureteral calculus on either side.  Aortic atherosclerosis noted without aneurysm or retroperitoneal hemorrhage.  Mild to moderate distention of the stomach and proximal small bowel with contrast and air. Distal small bowel is collapsed however there is air in the colon. Colonic diverticulosis noted. Appearance of the bowel is nonspecific.  No abdominal free fluid, fluid collection, hemorrhage, abscess, or adenopathy.  Pelvis: Diverticulosis of the descending colon and sigmoid. No acute inflammatory process. No pelvic free fluid, fluid collection, hemorrhage, abscess, adenopathy, inguinal abnormality, or hernia. Urinary bladder unremarkable. No acute distal bowel process.  Left hip replacement noted creating artifact. Degenerative changes of the right hip, pelvis and spine.  IMPRESSION: Gastric and proximal small bowel distention with air and contrast, nonspecific. No definite obstruction pattern. Negative for free air.  No acute obstructing urinary tract or ureteral calculus. No hydronephrosis or obstructive uropathy  Abdominal atherosclerosis  Colonic diverticulosis without acute inflammation   Electronically Signed   By: Jerilynn Mages.  Shick M.D.   On: 08/18/2015 14:30   I have personally reviewed and evaluated these images and lab results as part of my medical decision-making.   EKG  Interpretation   Date/Time:  Monday August 20 2015 06:02:40 EDT Ventricular Rate:  70 PR Interval:  151 QRS Duration: 164 QT Interval:  460 QTC Calculation: 496 R Axis:   -87 Text Interpretation:  Atrial-ventricular dual-paced rhythm No further  analysis attempted due to paced rhythm has not changed Confirmed by  [facility]  MD, TREY ([id number]) on 08/20/2015 8:27:27 AM      MDM   Final diagnoses:  Intractable vomiting with nausea, vomiting of unspecified type  Ileus (HCC)  Dehydration    Pt appears dehydrated, started on gentle fluids.  Abd distended but not tender. AAS  consistent with ileus.  Plan admit to family practice.    Serita Grit, MD 08/20/15 [phone]

## 2015-08-20 NOTE — Progress Notes (Signed)
ANTICOAGULATION CONSULT NOTE - Initial Consult  Pharmacy Consult for Heparin Indication: Afib  Allergies  Allergen Reactions  . Codeine     REACTION: nausea    Patient Measurements: TBW 88.5 kg IBW 68.4 kg Heparin dosing weight: 86.4 kg  Vital Signs: Temp: 98 F (36.7 C) (10/03 0842) Temp Source: Oral (10/03 0842) BP: 132/56 mmHg (10/03 0842) Pulse Rate: 72 (10/03 0842)  Labs:  Recent Labs  08/18/15 1026 08/20/15 0525  HGB 13.8 13.4  HCT 41.5 39.6  PLT 163 179  CREATININE 1.48* 1.37*  TROPONINI  --  0.04*    Estimated Creatinine Clearance: 36.4 mL/min (by C-G formula based on Cr of 1.37).   Medical History: Past Medical History  Diagnosis Date  . Overactive bladder   . Arthritis   . Diverticulosis of colon (without mention of hemorrhage)   . Gout   . Chronic kidney disease, stage III (moderate)   . Family history of malignant neoplasm of gastrointestinal tract   . Stricture and stenosis of esophagus   . Acute gastritis without mention of hemorrhage   . Unspecified cataract   . Hypertrophy of prostate with urinary obstruction and other lower urinary tract symptoms (LUTS)   . Personal history of colonic polyps 10/21/2006    hyperplastic   . Vitamin B12 deficiency   . Hiatal hernia   . Type II or unspecified type diabetes mellitus without mention of complication, not stated as uncontrolled     pt states that he checks his blood sugar once weekly, is not on oral meds or insulin  . Atrial fibrillation ([place])   . Sinus node dysfunction (HCC)   . Presence of permanent cardiac pacemaker 10/14/05    medtronic Adapta  . Hypertension   . Esophageal dysmotility   . Second degree AV block 11/29/2014    Assessment: 81 yo M presents on 10/3 with emesis and nausea. Has a PMH of Afib which he takes Xarelto at home for. Last dose was the evening of 10/2. Patient now NPO. Pharmacy consulted to start heparin for Afib. CBC stable, no s/s of bleed. Baseline HL pending and  aPTT is 45  Will wait to start heparin gtt about 24 hours from last Xarelto dose.  Goal of Therapy:  APTT 66-102 seconds Heparin level 0.3-0.7 units/ml Monitor platelets by anticoagulation protocol: Yes   Plan:  No heparin bolus Start heparin gtt at 1250 units/hr at 1800 tonight Check 8 hr aPTT at 0200 tomorrow Monitor daily HL / aPTT, CBC, s/s of bleed  Elenor Quinones, PharmD Clinical Pharmacist Pager [phone] 08/20/2015 9:06 AM

## 2015-08-20 NOTE — ED Notes (Signed)
Pt hooked up to the monitor with the 5 lead, PB cuff and pulse ox

## 2015-08-20 NOTE — ED Notes (Signed)
Patient transported to X-ray 

## 2015-08-20 NOTE — Telephone Encounter (Signed)
Rx request sent to pharmacy.  

## 2015-08-20 NOTE — ED Notes (Signed)
Admitting at bedside 

## 2015-08-21 LAB — BASIC METABOLIC PANEL
Anion gap: 8 (ref 5–15)
BUN: 22 mg/dL — AB (ref 6–20)
CO2: 23 mmol/L (ref 22–32)
Calcium: 8.3 mg/dL — ABNORMAL LOW (ref 8.9–10.3)
Chloride: 102 mmol/L (ref 101–111)
Creatinine, Ser: 1.34 mg/dL — ABNORMAL HIGH (ref 0.61–1.24)
GFR, EST AFRICAN AMERICAN: 51 mL/min — AB (ref >60)
GFR, EST NON AFRICAN AMERICAN: 44 mL/min — AB (ref >60)
Glucose, Bld: 128 mg/dL — ABNORMAL HIGH (ref 65–99)
POTASSIUM: 3.8 mmol/L (ref 3.5–5.1)
SODIUM: 133 mmol/L — AB (ref 135–145)

## 2015-08-21 LAB — CBC
HEMATOCRIT: 38.5 % — AB (ref 39.0–52.0)
Hemoglobin: 13 g/dL (ref 13.0–17.0)
MCH: 30.5 pg (ref 26.0–34.0)
MCHC: 33.8 g/dL (ref 30.0–36.0)
MCV: 90.4 fL (ref 78.0–100.0)
PLATELETS: 194 10*3/uL (ref 150–400)
RBC: 4.26 MIL/uL (ref 4.22–5.81)
RDW: 13.9 % (ref 11.5–15.5)
WBC: 6.4 10*3/uL (ref 4.0–10.5)

## 2015-08-21 LAB — APTT
APTT: 117 s — AB (ref 24–37)
APTT: 93 s — AB (ref 24–37)

## 2015-08-21 LAB — HEPARIN LEVEL (UNFRACTIONATED): Heparin Unfractionated: 1.5 IU/mL — ABNORMAL HIGH (ref 0.30–0.70)

## 2015-08-21 MED ORDER — RIVAROXABAN 15 MG PO TABS
15.0000 mg | ORAL_TABLET | Freq: Every day | ORAL | Status: DC
  Administered 2015-08-21 – 2015-08-22 (×2): 15 mg via ORAL
  Filled 2015-08-21 (×2): qty 1

## 2015-08-21 MED ORDER — ONDANSETRON HCL 4 MG PO TABS: 4.0000 mg | ORAL_TABLET | Freq: Three times a day (TID) | ORAL | Status: DC | PRN

## 2015-08-21 MED ORDER — RAMIPRIL 2.5 MG PO CAPS
2.5000 mg | ORAL_CAPSULE | Freq: Every morning | ORAL | Status: DC
  Administered 2015-08-21 – 2015-08-23 (×3): 2.5 mg via ORAL
  Filled 2015-08-21 (×3): qty 1

## 2015-08-21 MED ORDER — NEBIVOLOL HCL 10 MG PO TABS
10.0000 mg | ORAL_TABLET | ORAL | Status: DC
  Administered 2015-08-21 – 2015-08-23 (×3): 10 mg via ORAL
  Filled 2015-08-21 (×3): qty 1

## 2015-08-21 MED ORDER — NON FORMULARY: 4.0000 mg | Freq: Every day | Status: DC

## 2015-08-21 MED ORDER — SILODOSIN 8 MG PO CAPS
8.0000 mg | ORAL_CAPSULE | Freq: Every day | ORAL | Status: DC
  Administered 2015-08-22 – 2015-08-23 (×2): 8 mg via ORAL
  Filled 2015-08-21 (×3): qty 1

## 2015-08-21 MED ORDER — PANTOPRAZOLE SODIUM 40 MG PO TBEC
40.0000 mg | DELAYED_RELEASE_TABLET | Freq: Every day | ORAL | Status: DC
  Administered 2015-08-21 – 2015-08-23 (×3): 40 mg via ORAL
  Filled 2015-08-21 (×3): qty 1

## 2015-08-21 NOTE — Progress Notes (Signed)
ANTICOAGULATION CONSULT NOTE - Initial Consult  Pharmacy Consult for Heparin Indication: Afib  Allergies  Allergen Reactions  . Codeine     REACTION: nausea    Patient Measurements: TBW 88.5 kg IBW 68.4 kg Heparin dosing weight: 86.4 kg  Vital Signs: Temp: 97.4 F (36.3 C) (10/04 0427) Temp Source: Oral (10/04 0427) BP: 136/56 mmHg (10/04 0427) Pulse Rate: 72 (10/04 0427)  Labs:  Recent Labs  08/20/15 0525 08/20/15 1040 08/21/15 0140 08/21/15 1225  HGB 13.4  --  13.0  --   HCT 39.6  --  38.5*  --   PLT 179  --  194  --   APTT  --  45* 117* 93*  HEPARINUNFRC  --  >2.20* 1.50*  --   CREATININE 1.37*  --  1.34*  --   TROPONINI 0.04*  --   --   --     Estimated Creatinine Clearance: 37.2 mL/min (by C-G formula based on Cr of 1.34).   Medical History: Past Medical History  Diagnosis Date  . Overactive bladder   . Arthritis   . Diverticulosis of colon (without mention of hemorrhage)   . Gout   . Chronic kidney disease, stage III (moderate)   . Family history of malignant neoplasm of gastrointestinal tract   . Stricture and stenosis of esophagus   . Acute gastritis without mention of hemorrhage   . Unspecified cataract   . Hypertrophy of prostate with urinary obstruction and other lower urinary tract symptoms (LUTS)   . Personal history of colonic polyps 10/21/2006    hyperplastic   . Vitamin B12 deficiency   . Hiatal hernia   . Type II or unspecified type diabetes mellitus without mention of complication, not stated as uncontrolled     pt states that he checks his blood sugar once weekly, is not on oral meds or insulin  . Atrial fibrillation ([place])   . Sinus node dysfunction (HCC)   . Presence of permanent cardiac pacemaker 10/14/05    medtronic Adapta  . Hypertension   . Esophageal dysmotility   . Second degree AV block 11/29/2014  . Environmental allergies     HISTORY OF  . GERD (gastroesophageal reflux disease)   . History of skin cancer   . Small  bowel obstruction [facility])     Assessment: 82 yo M presents on 10/3 with emesis and nausea. Has a PMH of Afib which he takes Xarelto at home for. Last dose was the evening of 10/2. Patient now NPO. Pharmacy consulted to start heparin for Afib. CBC stable, no s/s of bleed. Baseline HL was > 2.2 and aPTT is 45. HL still falsely elevated this am at 1.5. APTT is therapeutic at 93.  Goal of Therapy:  APTT 66-102 seconds Heparin level 0.3-0.7 units/ml Monitor platelets by anticoagulation protocol: Yes   Plan:  Continue heparin gtt at 1,100 units/hr Check 8 hr confirmatory aPTT tonight Monitor daily HL / aPTT, CBC, s/s of bleed  Elenor Quinones, PharmD Clinical Pharmacist Pager [phone] 08/21/2015 1:29 PM

## 2015-08-21 NOTE — Discharge Summary (Signed)
[facility] Discharge Summary  Patient name: [name] Medical record number: [id number] Date of birth: Dec 21, 1919 Age: 81 y.o. Gender: male Date of Admission: 08/20/2015  Date of Discharge: 08/30/15 Admitting Physician: Dickie La, MD  Primary Care Provider: [name], MD Consultants: None  Indication for Hospitalization: Nausea and vomiting  Discharge Diagnoses/Problem List: partial SBO/ileus  Disposition: SNF  Discharge Condition: Stable  Discharge Exam: Gen: Alert, comfortable, sitting in chair eating breakfast  Pulm: CTAB, upper respiratory congestion CV: RRR Abd: distended, obese, nontender  Brief Hospital Course:  [name] is a 81yo male who presented with nausea and vomiting with radiographic evidence of ileus and SBO. PMH is significant for history of ileus, appendectomy, esophageal stricture, diverticulosis, AFib on xarelto with PPM, stage III CKD, and BPH. Hospital course, by problem, is as follows:  Ileus/partial SBO: CT scan on admission showed air-fluid levels in small bowel.Later in the day of admission, patient was passing flatus and BMs, with resolution of pain and N/V but complaining of some distention. Dehydration was treated with IVFs. Patient continued to have daily BMs, no N/V, and so diet was advanced as tolerated; however, abdominal XR after several days of conservative inpatient management showed progressive SBO. NG tube was placed and patient was returned to NPO and bowel rest. After several days, diet was advanced as tolerated and SBO resolved. Residual ileus was managed with Metamucil, Miralax, and SMOG enema. Recommend Metamucil and Miralax once daily as needed for continued treatment of ileus as OP.  BPH: On Rapaflo, previously failed Flomax. Initial complaints of incomplete emptying which resolved; no hydronephrosis on CT, <313mL residual volume on bladder scan. Increased Rapaflo 4mg -->8mg  daily, with drastically  increased urine output and continued severe nocturia symptoms. Discontinued Rapaflo when returned to NPO, then restarted again at 4mg . May consider titrating up as outpatient if worsening retention though caution dehydration as patient had dramatically increase UOP on this higher dose (1L per day). Discontinued elavil, though he denies taking this.  Paroxysmal atrial fibrillation/HTN: s/p cardioversion, now with pacemaker in situ, on dronedarone, nebivolol, ramipril, and Xarelto. Followed by Dr. [name]. Troponin neg x3. Last echo was 2006 with preserved EF and currently euvolemic. Patient was managed on IIV metoprolol and heparin while NPO, and transitioned back to home po nebivolol, ramipril, and Xarelto as tolerated. Home dronedarone was held given nausea and vomiting, restarted on 10/10. Intermittent systolic blood pressures to 170s, with some improvement after restarting his home antihypertensive regimen.   Fatigue and deconditioning: Patient lives at home by himself and was found to have difficulty with ADLs on PT/OT evaluation. Patient was amenable to discharge to SNF and will be going to requested location - [facility].  Upper respiratory congestion, chronic. Afebrile, no white count, no evidence of pneumonia on CXR. No evidence of nighttime O2 desaturation. No evidence of fluid overload on exam. Continue Mucinex as needed.  Moisture associated skin damage, sacral. Related to prolonged bedrest and use of disposable briefs that trap moisture. Continue to keep area clean and dry, with barrier cream as needed, assist patient with positioning.  Hyponatremia, resolved: Mild to 127 on admission, likely 2/2 decreased PO intake. Corrected gradually with isotonic IVF and increased PO intake.  Hypokalemia, resolved:  3.2 on admission, likely 2/2 decreased PO intake. Repleted with KCl and resolved to 3.9.  Issues for Follow Up:  1.  Consider up-titrating antihypertensives. SBPs  intermittently in 170s on home antihypertensives. 2. SNF to do: Continue to encourage PO  intake given recent dehydration, hyponatremia, and hypokalemia. Titrate Metamucil and Miralax as needed based on distension and BMs. 3. SNF to do: Continue to keep sacral skin damage clean and dry with barrier cream and assist patient with positioning to keep weight off area.   Significant Procedures: 10/7 NG tube placed 10/11 NG tube removed  Significant Labs and Imaging:   Recent Labs Lab 08/28/15 0419 08/29/15 0520 08/29/15 1630  WBC 10.3 12.6* 10.7*  HGB 11.7* 11.3* 11.1*  HCT 34.7* 33.4* 32.8*  PLT 153 154 142*    Recent Labs Lab 08/25/15 0403 08/26/15 0811 08/27/15 0413 08/28/15 0419  NA 139 133* 137 137  K 3.2* 3.2* 3.2* 3.9  CL 106 99* 102 103  CO2 26 26 27 26   GLUCOSE 125* 112* 115* 115*  BUN 10 7 7 6   CREATININE 1.16 1.12 1.21 1.05  CALCIUM 8.0* 8.0* 8.3* 8.5*  MG 1.6* 1.8  --   --     08/18/15 CT abdomen: Mild-mod stomach and proximal small bowel distention, distal small bowel collapsed, air in colon. No free air, no hydronephrosis 08/20/15 DG ABDOMEN ACUTE W/ 1V CHEST: Small bowel distention with air-fluid levels. Moderate amount of stool and air in the large bowel; findings suggest ileus. 08/23/15 DG ABD 1 View: Increasing small bowel distention when compared with the prior exam consistent with a progressive partial small bowel obstruction. No free air is seen. 08/25/15 DG Abd: Nasogastric tube placement to the gastric antrum with resolution of small bowel distension. 08/27/15 DG Abd: NG tube tip noted over the mid esophagus. No prominent bowel distention or free air. 08/30/2015 Pacing device is again seen and stable. The cardiac shadow is mildly enlarged but stable. Lungs are well aerated with mild atelectatic changes in the bases. Small right-sided pleural effusion is noted. No focal confluent infiltrate is seen. No bony abnormality is noted  Results/Tests Pending at Time  of Discharge: None  Discharge Medications:    Medication List    TAKE these medications        beta carotene w/minerals tablet  Take 1 tablet by mouth every morning.     ciprofloxacin-hydrocortisone otic suspension  Commonly known as:  CIPRO HC  Place 3 drops into the right ear 2 (two) times daily.     guaiFENesin 600 MG 12 hr tablet  Commonly known as:  MUCINEX  Take 1 tablet (600 mg total) by mouth 2 (two) times daily as needed for to loosen phlegm.     MULTAQ 400 MG tablet  Generic drug:  dronedarone  TAKE 1 TABLET TWICE A DAY  WITH A MEAL     nebivolol 10 MG tablet  Commonly known as:  BYSTOLIC  Take 1 tablet (10 mg total) by mouth every morning.     OVER THE COUNTER MEDICATION  Place 1 each into both ears as needed (for allergies.). Ear drops.     pantoprazole 40 MG tablet  Commonly known as:  PROTONIX  Take 1 tablet (40 mg total) by mouth daily before breakfast.     PHILLIPS COLON HEALTH PO  Take 1 tablet by mouth every morning.     polyethylene glycol packet  Commonly known as:  MIRALAX / GLYCOLAX  Take 17 g by mouth daily.     psyllium 95 % Pack  Commonly known as:  HYDROCIL/METAMUCIL  Take 1 packet by mouth daily.     ramipril 2.5 MG capsule  Commonly known as:  ALTACE  Take 1 capsule (2.5 mg total)  by mouth every morning.     Rivaroxaban 15 MG Tabs tablet  Commonly known as:  XARELTO  Take 1 tablet (15 mg total) by mouth at bedtime.     silodosin 4 MG Caps capsule  Commonly known as:  RAPAFLO  Take 1 capsule (4 mg total) by mouth daily with breakfast.     traMADol 50 MG tablet  Commonly known as:  ULTRAM  Take 1 tablet (50 mg total) by mouth every 8 (eight) hours as needed for moderate pain.        Discharge Instructions: Please refer to Patient Instructions section of EMR for full details.  Patient was counseled important signs and symptoms that should prompt return to medical care, changes in medications, dietary instructions, activity  restrictions, and follow up appointments.   Follow-Up Appointments: Follow-up Information    Follow up with Nobie Putnam, DO On 09/06/2015.   Specialty:  Osteopathic Medicine   Why:  1:45   Contact information:   [place] [address] [phone]       Follow up with Erlene Quan, PA-C On 09/18/2015.   Specialties:  Cardiology, Radiology   Why:  2:30pm   Contact information:   [address] [place] [address] [phone]       Completed with the help of MS!V  Durrell Barajas A. Lincoln Brigham MD, [facility] Resident PGY-2 Pager [phone]

## 2015-08-21 NOTE — Progress Notes (Signed)
[place] for Heparin Indication: Afib  Allergies  Allergen Reactions  . Codeine     REACTION: nausea    Patient Measurements: TBW 88.5 kg IBW 68.4 kg Heparin dosing weight: 86.4 kg  Vital Signs: Temp: 97.5 F (36.4 C) (10/03 2043) Temp Source: Oral (10/03 2043) BP: 135/72 mmHg (10/03 2043) Pulse Rate: 70 (10/03 2043)  Labs:  Recent Labs  08/18/15 1026 08/20/15 0525 08/20/15 1040 08/21/15 0140  HGB 13.8 13.4  --  13.0  HCT 41.5 39.6  --  38.5*  PLT 163 179  --  194  APTT  --   --  45* 117*  HEPARINUNFRC  --   --  >2.20* 1.50*  CREATININE 1.48* 1.37*  --  1.34*  TROPONINI  --  0.04*  --   --     Estimated Creatinine Clearance: 37.2 mL/min (by C-G formula based on Cr of 1.34).  Assessment: 81 yo Male with h/o Afib, Xarelto on hold, for heparin  Goal of Therapy:  APTT 66-102 seconds Heparin level 0.3-0.7 units/ml Monitor platelets by anticoagulation protocol: Yes   Plan:  Decrease Heparin 1100 units/hr Check aPTT in 8 hours  Phillis Knack, PharmD, BCPS  08/21/2015 3:44 AM

## 2015-08-21 NOTE — Clinical Social Work Note (Signed)
CSW received referral for SNF placement at time of discharge. Per chart review, patient requesting [facility]. CSW to continue to follow and assist with discharge planning needs.  Full assessment to follow.  Lubertha Sayres, [place] Clinical Social Work Department Orthopedics [phone]) and Surgical [phone])

## 2015-08-21 NOTE — Evaluation (Signed)
Physical Therapy Evaluation Patient Details Name: [name] MRN: [id number] DOB: [birth year] Today's Date: 08/21/2015   History of Present Illness  [name] is a 81 y.o. male presenting with nausea and vomiting with radiographic evidence of ileus. PMH is significant for history of ilues, appendectomy, esophageal stricture, diverticulosis, DNR, AFib on xarelto with PPM, stage III CKD, and BPH.  Clinical Impression  Pt admitted with above diagnosis. Pt currently with functional limitations due to the deficits listed below (see PT Problem List).  Pt will benefit from skilled PT to increase their independence and safety with mobility to allow discharge to the venue listed below.  Pt is not at a safe level to be d/c home alone and does not have 24 hour A available.  Pt demonstrated decreased ability to get OOB without rail, inability to manage footwear and decreased I with functional gait all contributing to pt not being safe at home alone.  Pt verbalized that he would like some short term rehab at a SNF and would prefer [facility] if possible as he and his brother have both been through their rehab program before.       Follow Up Recommendations SNF    Equipment Recommendations  None recommended by PT    Recommendations for Other Services       Precautions / Restrictions Precautions Precautions: Fall Restrictions Weight Bearing Restrictions: No      Mobility  Bed Mobility Overal bed mobility: Needs Assistance Bed Mobility: Supine to Sit     Supine to sit: Min guard     General bed mobility comments: Bed flat with heavy use of rail and much difficulty to come to EOB without PT A.  Transfers Overall transfer level: Needs assistance Equipment used: Rolling walker (2 wheeled) Transfers: Sit to/from Stand Sit to Stand: Min assist         General transfer comment: A for steadying and for hand placement and getting fully in front of chair for stand >  sit.  Ambulation/Gait Ambulation/Gait assistance: Min assist Ambulation Distance (Feet): 50 Feet (x 2) Assistive device: Rolling walker (2 wheeled) Gait Pattern/deviations: Decreased step length - left;Decreased weight shift to right;Trunk flexed;Shuffle Gait velocity: decreased   General Gait Details: Pt keeps RW too far forwards and needs cues for placement.  Initially pt taking almost shuffeling steps, but they did improve as gait progressed.  Stairs            Wheelchair Mobility    Modified Rankin (Stroke Patients Only)       Balance Overall balance assessment: Needs assistance   Sitting balance-Leahy Scale: Good       Standing balance-Leahy Scale: Poor                               Pertinent Vitals/Pain Pain Assessment: Faces Faces Pain Scale: Hurts little more Pain Location: R hip with mobility Pain Descriptors / Indicators: Aching;Dull Pain Intervention(s): Monitored during session;Repositioned    Home Living Family/patient expects to be discharged to:: Private residence Living Arrangements: Alone Available Help at Discharge: Family;Available PRN/intermittently Type of Home: House Home Access: Stairs to enter Entrance Stairs-Rails: Right Entrance Stairs-Number of Steps: 2 Home Layout: One level Home Equipment: Walker - 2 wheels;Shower seat - built in;Toilet riser      Prior Function Level of Independence: Independent with assistive device(s)         Comments: Amb with RW due to R hip weakness  Hand Dominance   Dominant Hand: Right    Extremity/Trunk Assessment   Upper Extremity Assessment: Overall WFL for tasks assessed           Lower Extremity Assessment: Overall WFL for tasks assessed;Generalized weakness         Communication   Communication: HOH  Cognition Arousal/Alertness: Awake/alert Behavior During Therapy: WFL for tasks assessed/performed Overall Cognitive Status: Within Functional Limits for tasks  assessed                      General Comments General comments (skin integrity, edema, etc.): Asked pt to adjust his socks at edge of bed, but unable to attempt due to leg soreness "arthritis" and stiffness.    Exercises        Assessment/Plan    PT Assessment Patient needs continued PT services  PT Diagnosis Difficulty walking;Generalized weakness   PT Problem List Decreased strength;Decreased activity tolerance;Decreased balance  PT Treatment Interventions DME instruction;Gait training;Functional mobility training;Therapeutic activities;Therapeutic exercise   PT Goals (Current goals can be found in the Care Plan section) Acute Rehab PT Goals Patient Stated Goal: Pt agreeable to short term rehab. "I can't go home like this." PT Goal Formulation: With patient Time For Goal Achievement: 09/04/15 Potential to Achieve Goals: Good    Frequency Min 3X/week   Barriers to discharge Decreased caregiver support Lives alone with no 24 hour A available.    Co-evaluation               End of Session Equipment Utilized During Treatment: Gait belt Activity Tolerance: Patient limited by fatigue Patient left: in chair;with call bell/phone within reach Nurse Communication: Mobility status (nurse tech)         Time: [phone] PT Time Calculation (min) (ACUTE ONLY): 24 min   Charges:   PT Evaluation $Initial PT Evaluation Tier I: 1 Procedure PT Treatments $Gait Training: 8-22 mins   PT G Codes:        Yahsir Wickens LUBECK 08/21/2015, 9:35 AM

## 2015-08-21 NOTE — Progress Notes (Signed)
Family Medicine Teaching Service Daily Progress Note Intern Pager: [phone]  Patient name: [name] Medical record number: [id number] Date of birth: 1920/04/08 Age: 81 y.o. Gender: male  Primary Care Provider: [name], MD Consultants: None Code Status: DNR  Pt Overview and Major Events to Date:  10/1: CT Abdomen Pelvis 10/3: DG Abd Acute w/Chest  Assessment and Plan: [name] is a 81 y.o. male presenting with nausea and vomiting with radiographic evidence of ileus. PMH is significant for history of ilues, appendectomy, esophageal stricture, diverticulosis, DNR, AFib on xarelto with PPM, stage III CKD, and BPH.  Ileus, resolved: Air-fluid levels in small bowel on CT scan. BM x3 since admission, passing some flatus. Pain, N/V resolved. - Advance diet today - Medications IV-->PO as tolerated - PRNs, none needed in 24h: Zofran 4mg  IV q8h prn nausea, morphine 1mg  IV prn abdominal pain - Maintenance IVF - IV PPI replacing home PPI  BPH: On rapaflo with continued severe nocturia symptoms. Should restart this at high dose instead of formulary alternative as he has failed flomax in the past. No hydronephrosis on CT, but concern for urinary retention given ongoing IVF. Discontinue elavil, though he denies taking this. [ ]  Strict I/O [ ]  Restart rapaflo [ ]  Bladder scan now, pt complaining of urinary retention - Bladder scan for low UOP and place foley for PVR > 300cc  Stage III CKD: At/below historical baseline, inconsistent with significant dehydration from poor po x 2 days. Holding ACE inhibitor due to NPO. Continue maintenance IVF's and advance diet slowly as able after resolution of ileus.  - MIVF  Hyponatremia: Mild, correct gradually with isotonic IVF.  - Na 136-->127 -->133 - Monitor BMP daily  Permanent atrial fibrillation/HTN: s/p cardioversion, now with PPM in situ, on dronedarone and bystolic. Followed by Dr. [name]. Troponin neg x3. ECG shows dual paced  rhythm so offers little insight into cardiac ischemia. Last echo was 2006 with preserved EF and currently euvolemic.  - Replace nebivolol with metoprolol 2.5mg  IV BID and hold dronedarone as no IV equivalent is available.  - Restart home po meds as able - Decrease Heparin 1100 units/hr per pharmacy in lieu of xarelto while NPO - Goal of therapy: APTT 66-102 seconds, 117 today; Heparin level 0.3-0.7 units/ml, 1.5 today  Esophageal stricture/dysmotility: Not thought to be contributing to presentation. Followed by Dr. Hilarie Fredrickson, last EGD with dilation Nov 2015 otherwise noted to be normal. - No intervention planned.  FEN/GI: Heart healthy diet, advance as tolerated. Prophylaxis: Heparin per pharmacy for therapeutic anticoagulation until we can transition back to xarelto.  Disposition: Home  Subjective:  No acute events overnight. Patient reports that he "didn't have a good night" but that he feels better this morning, without nausea or pain, but states he is not sure if he will be able to eat anything. Patient notes some epigastric discomfort but states that it is not different from his baseline. BM x2 overnight, some flatus, no emesis since admission. Patient also notes concern about difficulty urinating but also states that this is not different from his baseline, stating that at home he "just deals with it". He states that he doesn't think he is ready to go home because he lives by himself and "needs to feel better" before he can go home.  Objective: Temp:  [97.4 F (36.3 C)-99.1 F (37.3 C)] 97.4 F (36.3 C) (10/04 0427) Pulse Rate:  [70-72] 72 (10/04 0427) Resp:  [18-19] 19 (10/04 0427) BP: (132-141)/(56-72) 136/56 mmHg (10/04  0427) SpO2:  [95 %-96 %] 96 % (10/04 0427) Physical Exam: General: Alert, lying in bed, slightly uncomfortable but NAD Cardiovascular: RRR Respiratory: CTAB Abdomen: sparse bowel sounds, soft, nontender, distended but obese abdomen, exam limited by body habitus.  Some epigastric tenderness with palpation without rebound, nontender elsewhere.  Laboratory:  Recent Labs Lab 08/18/15 1026 08/20/15 0525 08/21/15 0140  WBC 8.4 4.7 6.4  HGB 13.8 13.4 13.0  HCT 41.5 39.6 38.5*  PLT 163 179 194    Recent Labs Lab 08/18/15 1026 08/20/15 0525 08/21/15 0140  NA 136 127* 133*  K 5.2* 3.5 3.8  CL 102 92* 102  CO2 26 24 23   BUN 19 24* 22*  CREATININE 1.48* 1.37* 1.34*  CALCIUM 9.2 8.7* 8.3*  PROT 6.4* 6.2*  --   BILITOT 0.9 1.0  --   ALKPHOS 46 39  --   ALT 15* 17  --   AST 19 33  --   GLUCOSE 154* 136* 128*    Imaging/Diagnostic Tests: DG ABDOMEN ACUTE W/ 1V CHEST: Small bowel distention with air-fluid levels. Moderate amount of stool and air in the large bowel; findings suggest ileus.  CT Abd Pelvis wo contrast: Gastric and proximal small bowel distention with air and contrast, nonspecific. No definite obstruction pattern. Negative for free air.  [name], Med Student 08/21/2015, 8:32 AM   Resident Attestation   I have seen and examined the patient. I have read and agree with the above note, with changes as noted below     [name] is a 81 y.o. male presenting with nausea and vomiting with radiographic evidence of ileus. PMH is significant for history of ilues, appendectomy, esophageal stricture, diverticulosis, DNR, AFib on xarelto with PPM, stage III CKD, and BPH. Now reports has not been able to urinate well. He feels this is secondary to BPH  S: Reports resolved nausea and emesis. Had a bowel movement this AM and has been passing gas. However, this AM reports he did not sleep well last night or the night prior and feels worn out. He wants to go home when his strength improves. He states that he does not wish to go to a SNF and his children help him at home. He just wants to get stronger here then go hom  BP 136/56 mmHg  Pulse 72  Temp(Src) 97.4 F (36.3 C) (Oral)  Resp 19  SpO2 96%  Exam Gen: NAD, lying in  bed CV: RRR Pulm: CTAB Abd: soft, non tender, + BS  A/P Resolved nausea/vomiting with + BS this AM and likely resolution of Ileus after Bowel rest, now with concern for urinary retention  Ileus - Resolved- D/c gastrograffin study - ADAT - restart home meds - reduce IVF as takes better PO - PO zofran, d/c IV zofran  BPH with concern for retention  - Will confirm with nurse regarding UOP - Bladder scan now - restart home BPH meds  CKD- Stable Cr 1.34 - continue to monitor  H/o Afib- Paced,  - restart home nebovolol - d/c IV metroprolol - continue heparin, consider restarting home Xarelto when pt proves is tolerating PO well  HTN: Stable - restart home ramipiril   GERD- Stable - d/c IV protonix, start oral  Fatigue- Seems to be an ongoing issue now exacerbated by poor sleep, but concern for chronic deterioration - PT to see today - SW consult to discuss SNF  FEN/GI - Advance diet as above, reduce IVF if tolerated PO  Tamekia Rotter A. Lincoln Brigham MD, [place] [facility] Resident PGY-2 Pager [phone]

## 2015-08-21 NOTE — Progress Notes (Signed)
Occupational Therapy Evaluation Patient Details Name: [name] MRN: [id number] DOB: [birth year] Today's Date: 08/21/2015    History of Present Illness [name] is a 81 y.o. male presenting with nausea and vomiting with radiographic evidence of ileus. PMH is significant for history of ilues, appendectomy, esophageal stricture, diverticulosis, DNR, AFib on xarelto with PPM, stage III CKD, and BPH.   Clinical Impression   Pt admitted with the above diagnoses and presents with below problem list. Pt will benefit from continued acute OT to address the below listed deficits and maximize independence with BADLs prior to d/c to venue below. PTA pt was mod I with ADLs. Pt is currently min A for ADLs and functional transfers. Session details below. Of note, pt noted to return quickly to bed at end of session c/o abdominal discomfort and SOB. Pulse-ox assesed in supine with O2 95-97, bpm 76. OT to continue to follow acutely.      Follow Up Recommendations  SNF    Equipment Recommendations  Other (comment) (defer to nxt venue)    Recommendations for Other Services       Precautions / Restrictions Precautions Precautions: Fall Restrictions Weight Bearing Restrictions: No      Mobility Bed Mobility Overal bed mobility: Needs Assistance Bed Mobility: Rolling;Sidelying to Sit;Sit to Supine Rolling: Min guard Sidelying to sit: Min assist Supine to sit: Min guard Sit to supine: Min guard   General bed mobility comments: Pt noted to hurriedly return to bed at end of session citing abdomonal discomfort and some SOB. Pulse-ox assesed in supine with O2 95-97, bpm 76.  Transfers Overall transfer level: Needs assistance Equipment used: Rolling walker (2 wheeled) Transfers: Sit to/from Stand Sit to Stand: Min assist         General transfer comment: from comfort height toilet and EOB. cues for technique with rw and not to put rw to the side prior to pivoting.    Balance  Overall balance assessment: Needs assistance   Sitting balance-Leahy Scale: Good     Standing balance support: Bilateral upper extremity supported;During functional activity Standing balance-Leahy Scale: Poor Standing balance comment: rw for balance                            ADL Overall ADL's : Needs assistance/impaired Eating/Feeding: Set up;Sitting   Grooming: Min guard;Wash/dry hands;Standing   Upper Body Bathing: Minimal assitance;Sitting   Lower Body Bathing: Minimal assistance;Sit to/from stand   Upper Body Dressing : Minimal assistance;Sitting   Lower Body Dressing: Minimal assistance;Sit to/from stand   Toilet Transfer: Minimal assistance;Ambulation;Comfort height toilet;Grab bars;RW   Toileting- Clothing Manipulation and Hygiene: Minimal assistance;Sitting/lateral lean   Tub/ Shower Transfer: Minimal assistance;Walk-in shower;Ambulation;3 in 1   Functional mobility during ADLs: Min guard;Rolling walker General ADL Comments: Pt completed in-room functional mobility, grooming at sink, and toilet transfer at levels above. Pt with difficulty accessing feet in sitting postion. Min A for balance during toilet transfer.      Vision     Perception     Praxis      Pertinent Vitals/Pain Pain Assessment: Faces Faces Pain Scale: Hurts little more Pain Location: R hip with mobility; c/o ambdomen "feels funny" after ambulation Pain Descriptors / Indicators: Dull;Aching Pain Intervention(s): Limited activity within patient's tolerance;Monitored during session;Repositioned     Hand Dominance Right   Extremity/Trunk Assessment Upper Extremity Assessment Upper Extremity Assessment: Overall WFL for tasks assessed;Generalized weakness   Lower Extremity Assessment Lower  Extremity Assessment: Defer to PT evaluation       Communication Communication Communication: HOH   Cognition Arousal/Alertness: Awake/alert Behavior During Therapy: WFL for tasks  assessed/performed Overall Cognitive Status: Within Functional Limits for tasks assessed                     General Comments       Exercises       Shoulder Instructions      Home Living Family/patient expects to be discharged to:: Skilled nursing facility Living Arrangements: Alone Available Help at Discharge: Family;Available PRN/intermittently Type of Home: House Home Access: Stairs to enter [employer] of Steps: 2 Entrance Stairs-Rails: Right Home Layout: One level     Bathroom Shower/Tub: Walk-in shower         Home Equipment: Environmental consultant - 2 wheels;Shower seat - built in;Toilet riser;Bedside commode          Prior Functioning/Environment Level of Independence: Independent with assistive device(s)        Comments: Amb with RW due to R hip weakness    OT Diagnosis: Acute pain   OT Problem List: Impaired balance (sitting and/or standing);Decreased activity tolerance;Decreased knowledge of use of DME or AE;Decreased knowledge of precautions;Pain   OT Treatment/Interventions: Self-care/ADL training;Energy conservation;DME and/or AE instruction;Therapeutic activities;Patient/family education;Balance training    OT Goals(Current goals can be found in the care plan section) Acute Rehab OT Goals Patient Stated Goal: Pt agreeable to short term rehab. "I can't go home like this." OT Goal Formulation: With patient/family Time For Goal Achievement: 08/28/15 Potential to Achieve Goals: Good ADL Goals Pt Will Perform Grooming: with modified independence;standing Pt Will Perform Upper Body Bathing: with modified independence;sitting Pt Will Perform Lower Body Bathing: with modified independence;with adaptive equipment;sit to/from stand Pt Will Perform Upper Body Dressing: with modified independence;sitting Pt Will Perform Lower Body Dressing: with modified independence;with adaptive equipment;sit to/from stand Pt Will Transfer to Toilet: with modified  independence;ambulating (3n1 over toilet) Pt Will Perform Toileting - Clothing Manipulation and hygiene: with modified independence;sitting/lateral leans;sit to/from stand  OT Frequency: Min 2X/week   Barriers to D/C: Decreased caregiver support          Co-evaluation              End of Session Equipment Utilized During Treatment: Gait belt;Rolling walker  Activity Tolerance: Other (comment) (increased abdominal discomfort at end of session, SOB.) Patient left: in bed;with call bell/phone within reach;with family/visitor present   Time: 1157-1220 OT Time Calculation (min): 23 min Charges:  OT General Charges $OT Visit: 1 Procedure OT Evaluation $Initial OT Evaluation Tier I: 1 Procedure OT Treatments $Self Care/Home Management : 8-22 mins G-Codes:    Hortencia Pilar 09/09/2015, 12:32 PM

## 2015-08-22 DIAGNOSIS — K56609 Unspecified intestinal obstruction, unspecified as to partial versus complete obstruction: Secondary | ICD-10-CM | POA: Insufficient documentation

## 2015-08-22 DIAGNOSIS — K5669 Other intestinal obstruction: Secondary | ICD-10-CM

## 2015-08-22 LAB — CBC
HCT: 37 % — ABNORMAL LOW (ref 39.0–52.0)
HEMOGLOBIN: 12.7 g/dL — AB (ref 13.0–17.0)
MCH: 31.2 pg (ref 26.0–34.0)
MCHC: 34.3 g/dL (ref 30.0–36.0)
MCV: 90.9 fL (ref 78.0–100.0)
Platelets: 168 10*3/uL (ref 150–400)
RBC: 4.07 MIL/uL — ABNORMAL LOW (ref 4.22–5.81)
RDW: 13.9 % (ref 11.5–15.5)
WBC: 7 10*3/uL (ref 4.0–10.5)

## 2015-08-22 LAB — BASIC METABOLIC PANEL
ANION GAP: 7 (ref 5–15)
BUN: 8 mg/dL (ref 6–20)
CALCIUM: 7.8 mg/dL — AB (ref 8.9–10.3)
CO2: 25 mmol/L (ref 22–32)
Chloride: 98 mmol/L — ABNORMAL LOW (ref 101–111)
Creatinine, Ser: 1.07 mg/dL (ref 0.61–1.24)
GFR, EST NON AFRICAN AMERICAN: 58 mL/min — AB (ref >60)
GLUCOSE: 115 mg/dL — AB (ref 65–99)
Potassium: 3.3 mmol/L — ABNORMAL LOW (ref 3.5–5.1)
SODIUM: 130 mmol/L — AB (ref 135–145)

## 2015-08-22 NOTE — Clinical Social Work Note (Signed)
Clinical Social Work Assessment  Patient Details  Name: [name] MRN: [id number] Date of Birth: 09-22-1920  Date of referral:  08/22/15               Reason for consult:  Facility Placement, Discharge Planning                Permission sought to share information with:  Chartered certified accountant granted to share information::  Yes, Verbal Permission Granted  Name::        Agency::  [facility]  Relationship::     Contact Information:     Housing/Transportation Living arrangements for the past 2 months:  Single Family Home Source of Information:  Patient Patient Interpreter Needed:  None Criminal Activity/Legal Involvement Pertinent to Current Situation/Hospitalization:  No - Comment as needed Significant Relationships:  Other(Comment), Adult Children Psychologist, occupational) Lives with:  Self Do you feel safe going back to the place where you live?  No (High fall risk.) Need for family participation in patient care:  No (Coment) (Patient able to make own decisions.)  Care giving concerns:  Patient expressed no concerns at this time.   Social Worker assessment / plan:  CSW received referral for possible SNF placement at time of discharge. CSW met with patient at bedside to discuss discharge disposition. Patient expressed understanding of PT recommendation for SNF placement at time of discharge. Per patient, patient would prefer to discharge to [facility] once medically stable. Patient informed CSW patient from home alone, but has step-children who live "down the road" and check-in on patient daily. CSW to continue to follow and assist with discharge planning needs.  Employment status:  Retired Forensic scientist:  Medicare PT Recommendations:  [place] / Referral to community resources:  [place]  Patient/Family's Response to care:  Patient understanding and agreeable to CSW plan of  care.  Patient/Family's Understanding of and Emotional Response to Diagnosis, Current Treatment, and Prognosis:  Patient understanding and agreeable to CSW plan of care.  Emotional Assessment Appearance:  Appears stated age Attitude/Demeanor/Rapport:  Other (Pleasant.) Affect (typically observed):  Accepting, Appropriate, Pleasant, Quiet Orientation:  Oriented to Self, Oriented to Place, Oriented to  Time, Oriented to Situation Alcohol / Substance use:  Not Applicable Psych involvement (Current and /or in the community):  No (Comment) (Not appropriate on this admission.)  Discharge Needs  Concerns to be addressed:  No discharge needs identified Readmission within the last 30 days:  No Current discharge risk:  None Barriers to Discharge:  No Barriers Identified   Caroline Sauger, LCSW 08/22/2015, 2:29 PM [phone]

## 2015-08-22 NOTE — Progress Notes (Signed)
Patient concerned that he has not been getting his Multaq during this hospitalization.  Notified MD twice.  Received call back and was informed that patient would be restarted on this medication as an outpatient.  Patient made aware.

## 2015-08-22 NOTE — Progress Notes (Signed)
. Family Medicine Teaching Service Daily Progress Note Intern Pager: [phone]  Patient name: [name] Medical record number: [id number] Date of birth: Aug 30, 1920 Age: 81 y.o. Gender: male  Primary Care Provider: [name], MD Consultants: None Code Status: DNR  Pt Overview and Major Events to Date:  10/1: CT Abdomen Pelvis 10/3: DG Abd Acute w/Chest  Assessment and Plan: [name] is a 81 y.o. male presenting with nausea and vomiting with radiographic evidence of ileus. PMH is significant for history of ilues, appendectomy, esophageal stricture, diverticulosis, DNR, AFib on xarelto with PPM, stage III CKD, and BPH.  Ileus, resolved: Air-fluid levels in small bowel on CT scan. BM x4 since admission, passing flatus. Pain, N/V resolved. - Continue to advance diet, full heart healthy diet today - All medications PO - d/c Maintenance IVF - Xarelto restarted x1 dose, d/c heparin - PRNs, none needed in 48h: Zofran 4mg  IV q8h prn nausea, morphine 1mg  IV prn abdominal pain  BPH: On rapaflo with continued severe nocturia symptoms. Should restart this at high dose instead of formulary alternative as he has failed flomax in the past. No hydronephrosis on CT, but concern for urinary retention given ongoing IVF. Discontinue elavil, though he denies taking this. - Strict I/O - Restart rapaflo restarted - Bladder scan with <377mL, voided 744mL urine last 24h with net +157mL  Stage III CKD: At/below historical baseline - d/c MIVF - advance diet to full today, patient tolerating PO well  Hyponatremia: Mild, correct gradually with isotonic IVF.  - Na 136-->127 -->133 - Monitor BMP daily  Permanent atrial fibrillation/HTN: s/p cardioversion, now with PPM in situ, on dronedarone and bystolic. Followed by Dr. [name]. Troponin neg x3. ECG shows dual paced rhythm so offers little insight into cardiac ischemia. Last echo was 2006 with preserved EF and currently euvolemic.  - Restarted  home nebivolol and dronedarone  - Restarted Xarelto, d/c heparin today  Esophageal stricture/dysmotility: Not thought to be contributing to presentation. Followed by Dr. Hilarie Fredrickson, last EGD with dilation Nov 2015 otherwise noted to be normal. - No intervention planned.  FEN/GI: Heart healthy diet, advance as tolerated. Prophylaxis: Heparin per pharmacy for therapeutic anticoagulation until we can transition back to xarelto.  Disposition: SNF, pt requested [facility]  Subjective:  No acute events overnight. Patient reports that he feels much better this morning, with improved appetite and decreased bloating. BM x1 overnight, passing flatus, no emesis since admission. Good tolerance of PO. Patient in agreement with discharge to SNF.  Objective: Temp:  [97.3 F (36.3 C)-97.5 F (36.4 C)] 97.5 F (36.4 C) (10/05 0500) Pulse Rate:  [73-85] 85 (10/05 0500) Resp:  [18-19] 18 (10/05 0500) BP: (123-167)/(62-74) 123/62 mmHg (10/05 0500) SpO2:  [96 %-97 %] 96 % (10/05 0500) Weight:  [194 lb 14.2 oz (88.4 kg)] 194 lb 14.2 oz (88.4 kg) (10/05 0700) Physical Exam: General: Alert, comfortably sitting in chair eating breakfast Cardiovascular: RRR Respiratory: CTAB Abdomen: frequent bowel sounds, soft, nontender, distended but obese abdomen, exam limited by body habitus.  Laboratory:  Recent Labs Lab 08/18/15 1026 08/20/15 0525 08/21/15 0140  WBC 8.4 4.7 6.4  HGB 13.8 13.4 13.0  HCT 41.5 39.6 38.5*  PLT 163 179 194    Recent Labs Lab 08/18/15 1026 08/20/15 0525 08/21/15 0140  NA 136 127* 133*  K 5.2* 3.5 3.8  CL 102 92* 102  CO2 26 24 23   BUN 19 24* 22*  CREATININE 1.48* 1.37* 1.34*  CALCIUM 9.2 8.7* 8.3*  PROT 6.4* 6.2*  --   BILITOT 0.9 1.0  --   ALKPHOS 46 39  --   ALT 15* 17  --   AST 19 33  --   GLUCOSE 154* 136* 128*    Imaging/Diagnostic Tests: DG ABDOMEN ACUTE W/ 1V CHEST: Small bowel distention with air-fluid levels. Moderate amount of stool and air  in the large bowel; findings suggest ileus.  CT Abd Pelvis wo contrast: Gastric and proximal small bowel distention with air and contrast, nonspecific. No definite obstruction pattern. Negative for free air.  [name], Med Student 08/22/2015, 9:20 AM    Resident Addendum [name] is a 81 y.o. male presenting with nausea and vomiting with radiographic evidence of ileus. PMH is significant for history of ilues, appendectomy, esophageal stricture, diverticulosis, DNR, AFib on xarelto with PPM, stage III CKD, and BPH.  S: Feels overall improved. Denies nausea/emesis, had a BM this AM and has been passing gas. Denies chest pain, SOB  Illeus- Resolved - will advance diet today - d/c IV fluids  BPH - Restart home meds - monitor I/Os  CKD- Stable, 1.07 today  - follow as needed - follow I/Os  H/o afib/paced - continue home Xarelto - continue home nebivolol - question about home dronedrone, will plan to continue as an outpatient  Hyponatremia- Improving to 133 - Will follow today - continue to trend  Weakness-  - PT/OT- SNF - SW following as well and SNF ([facility])  Dispo: SNF when bed available  Exam:  BP 129/68 mmHg  Pulse 69  Temp(Src) 98 F (36.7 C) (Axillary)  Resp 18  Ht 5\' 8"  (1.727 m)  Wt 194 lb 14.2 oz (88.4 kg)  BMI 29.64 kg/m2  SpO2 99%  Gen : NAD sitting up in bed CV: RRR Pulm: CTB Abd- soft, non tender, + BS Extremities: non tender no LE edema   Kianah Harries A. Lincoln Brigham MD, [facility] Resident PGY-2 Pager [phone]

## 2015-08-22 NOTE — Clinical Social Work Placement (Signed)
   CLINICAL SOCIAL WORK PLACEMENT  NOTE  Date:  08/22/2015  Patient Details  Name: [name] MRN: [id number] Date of Birth: 31-Dec-1919  Clinical Social Work is seeking post-discharge placement for this patient at the [place] level of care (*CSW will initial, date and re-position this form in  chart as items are completed):  Yes   Patient/family provided with [facility] Work Department's list of facilities offering this level of care within the geographic area requested by the patient (or if unable, by the patient's family).  Yes   Patient/family informed of their freedom to choose among providers that offer the needed level of care, that participate in Medicare, Medicaid or managed care program needed by the patient, have an available bed and are willing to accept the patient.  Yes   Patient/family informed of [facility]'s ownership interest in [facility] and [facility], as well as of the fact that they are under no obligation to receive care at these facilities.  PASRR submitted to EDS on  (n/a)     PASRR number received on  (n/a)     Existing PASRR number confirmed on 08/22/15     FL2 transmitted to all facilities in geographic area requested by pt/family on 08/22/15     FL2 transmitted to all facilities within larger geographic area on  (n/a)     Patient informed that his/her managed care company has contracts with or will negotiate with certain facilities, including the following:   (yes, [facility])     Yes   Patient/family informed of bed offers received.  Patient chooses bed at [place], [place]     Physician recommends and patient chooses bed at  (n/a)    Patient to be transferred to [place], [place] on  .  Patient to be transferred to facility by       Patient family notified on   of transfer.  Name of family member notified:        PHYSICIAN Please sign FL2, Please sign DNR     Additional  Comment:    _______________________________________________ Caroline Sauger, LCSW 08/22/2015, 2:32 PM

## 2015-08-23 ENCOUNTER — Encounter (HOSPITAL_COMMUNITY): Payer: Self-pay | Admitting: Physician Assistant

## 2015-08-23 ENCOUNTER — Inpatient Hospital Stay (HOSPITAL_COMMUNITY): Payer: Medicare Other

## 2015-08-23 DIAGNOSIS — I441 Atrioventricular block, second degree: Secondary | ICD-10-CM

## 2015-08-23 DIAGNOSIS — Z950 Presence of cardiac pacemaker: Secondary | ICD-10-CM

## 2015-08-23 DIAGNOSIS — Z01810 Encounter for preprocedural cardiovascular examination: Secondary | ICD-10-CM

## 2015-08-23 DIAGNOSIS — R140 Abdominal distension (gaseous): Secondary | ICD-10-CM

## 2015-08-23 DIAGNOSIS — K56609 Unspecified intestinal obstruction, unspecified as to partial versus complete obstruction: Secondary | ICD-10-CM | POA: Insufficient documentation

## 2015-08-23 LAB — BASIC METABOLIC PANEL
ANION GAP: 8 (ref 5–15)
BUN: 10 mg/dL (ref 6–20)
CALCIUM: 8.2 mg/dL — AB (ref 8.9–10.3)
CO2: 24 mmol/L (ref 22–32)
Chloride: 104 mmol/L (ref 101–111)
Creatinine, Ser: 1.18 mg/dL (ref 0.61–1.24)
GFR, EST AFRICAN AMERICAN: 59 mL/min — AB (ref >60)
GFR, EST NON AFRICAN AMERICAN: 51 mL/min — AB (ref >60)
Glucose, Bld: 99 mg/dL (ref 65–99)
Potassium: 3.5 mmol/L (ref 3.5–5.1)
SODIUM: 136 mmol/L (ref 135–145)

## 2015-08-23 MED ORDER — POTASSIUM CHLORIDE CRYS ER 20 MEQ PO TBCR: 40.0000 meq | EXTENDED_RELEASE_TABLET | Freq: Once | ORAL | Status: DC

## 2015-08-23 MED ORDER — ONDANSETRON HCL 4 MG/2ML IJ SOLN: 4.0000 mg | Freq: Four times a day (QID) | INTRAMUSCULAR | Status: DC | PRN

## 2015-08-23 MED ORDER — HEPARIN (PORCINE) IN NACL 100-0.45 UNIT/ML-% IJ SOLN
1250.0000 [IU]/h | INTRAMUSCULAR | Status: DC
  Administered 2015-08-23: 1100 [IU]/h via INTRAVENOUS
  Administered 2015-08-24 – 2015-08-25 (×2): 1000 [IU]/h via INTRAVENOUS
  Administered 2015-08-26: 1100 [IU]/h via INTRAVENOUS
  Administered 2015-08-27 – 2015-08-28 (×2): 1250 [IU]/h via INTRAVENOUS
  Filled 2015-08-23 (×9): qty 250

## 2015-08-23 MED ORDER — DEXTROSE-NACL 5-0.9 % IV SOLN
INTRAVENOUS | Status: DC
  Administered 2015-08-23 – 2015-08-25 (×4): via INTRAVENOUS

## 2015-08-23 MED ORDER — PANTOPRAZOLE SODIUM 40 MG IV SOLR: 40.0000 mg | Freq: Two times a day (BID) | INTRAVENOUS | Status: DC

## 2015-08-23 MED ORDER — PANTOPRAZOLE SODIUM 40 MG IV SOLR
40.0000 mg | Freq: Two times a day (BID) | INTRAVENOUS | Status: DC
  Administered 2015-08-23 – 2015-08-27 (×9): 40 mg via INTRAVENOUS
  Filled 2015-08-23 (×9): qty 40

## 2015-08-23 MED ORDER — METOPROLOL TARTRATE 1 MG/ML IV SOLN
2.5000 mg | Freq: Two times a day (BID) | INTRAVENOUS | Status: DC
  Administered 2015-08-24 – 2015-08-27 (×7): 2.5 mg via INTRAVENOUS
  Filled 2015-08-23 (×7): qty 5

## 2015-08-23 NOTE — Consult Note (Signed)
CARDIOLOGY CONSULT NOTE   Patient ID: [name] MRN: [id number] DOB/AGE: Mar 20, 1920 81 y.o.  Admit date: 08/20/2015  Primary Physician   [name], MD Primary Cardiologist   Dr [name] Reason for Consultation   Restarting dronedarone   SFK:CLEXNT [name] is a 81 y.o. year old male with a history of ileus, appendectomy, esophageal stricture, diverticulosis, DNR, AFib on xarelto with MDT PPM, stage III CKD, DNR and BPH.  Admitted 10/03 with SBO. He has improved with medical therapy, no surgery required. He was initially ready for d/c to SNF, but had recurrence of his obstruction and surgery is seeing him.  Prior to admission he was on dronedarone 400 mg bid. The Family Medicine team held it on admission due to NPO status, and they wish to have cardiology input regarding resuming his dronedarone.      Past Medical History  Diagnosis Date  . Overactive bladder   . Arthritis   . Diverticulosis of colon (without mention of hemorrhage)   . Gout   . Chronic kidney disease, stage III (moderate)   . Family history of malignant neoplasm of gastrointestinal tract   . Stricture and stenosis of esophagus   . Acute gastritis without mention of hemorrhage   . Unspecified cataract   . Hypertrophy of prostate with urinary obstruction and other lower urinary tract symptoms (LUTS)   . Personal history of colonic polyps 10/21/2006    hyperplastic   . Vitamin B12 deficiency   . Hiatal hernia   . Type II or unspecified type diabetes mellitus without mention of complication, not stated as uncontrolled     pt states that he checks his blood sugar once weekly, is not on oral meds or insulin  . Atrial fibrillation ([name])   . Sinus node dysfunction (HCC)   . Presence of permanent cardiac pacemaker 10/14/05    medtronic Adapta  . Hypertension   . Esophageal dysmotility   . Second degree AV block 11/29/2014  . Environmental allergies     HISTORY OF  . GERD (gastroesophageal reflux  disease)   . History of skin cancer   . Small bowel obstruction [name])      Past Surgical History  Procedure Laterality Date  . Appendectomy    . Tonsillectomy    . Pacemaker insertion  replaced 2 months ago, total of 8 years with pacemaker  . Total hip arthroplasty Bilateral   . Cardioversion  10/01/2011    Procedure: CARDIOVERSION;  Surgeon: Dani Gobble Xiana Carns;  Location: [facility] OR;  Service: Cardiovascular;  Laterality: N/A;  OP CARDIOVERSION  . Cardioversion  11/02/12    successful  . Eye surgery Bilateral years ago    cataract lens replacment  . Esophagogastroduodenoscopy (egd) with propofol N/A 09/22/2014    Procedure: ESOPHAGOGASTRODUODENOSCOPY (EGD) WITH PROPOFOL;  Surgeon: Jerene Bears, MD;  Location: [facility] ENDOSCOPY;  Service: Gastroenterology;  Laterality: N/A;  . Savory dilation N/A 09/22/2014    Procedure: SAVORY DILATION;  Surgeon: Jerene Bears, MD;  Location: [facility] ENDOSCOPY;  Service: Gastroenterology;  Laterality: N/A;  . Permanent pacemaker generator change N/A 03/14/2014    Procedure: PERMANENT PACEMAKER GENERATOR CHANGE;  Surgeon: Sanda Klein, MD; Medtronic Adapta L model number ADDRL1, serial number [id number]     Allergies  Allergen Reactions  . Codeine     REACTION: nausea   I have reviewed the patient's current medications . nebivolol  10 mg Oral [id number]  . pantoprazole  40 mg Oral QAC breakfast  .  ramipril  2.5 mg Oral q morning - 10a  . Rivaroxaban  15 mg Oral Q supper  . silodosin  8 mg Oral Q breakfast  . sodium chloride  3 mL Intravenous Q12H     sodium chloride, ondansetron, sodium chloride  Medication Sig  beta carotene w/minerals (OCUVITE) tablet Take 1 tablet by mouth every morning.   ciprofloxacin-hydrocortisone (CIPRO HC) otic suspension Place 3 drops into the right ear 2 (two) times daily.  nebivolol (BYSTOLIC) 10 MG tablet Take 1 tablet (10 mg total) by mouth every morning.  OVER THE COUNTER MEDICATION Place 1 each into both ears as needed (for  allergies.). Ear drops.  pantoprazole (PROTONIX) 40 MG tablet Take 1 tablet (40 mg total) by mouth daily before breakfast.  Probiotic Product ([place]) Take 1 tablet by mouth every morning.   ramipril (ALTACE) 2.5 MG capsule Take 1 capsule (2.5 mg total) by mouth every morning.  Rivaroxaban (XARELTO) 15 MG TABS tablet Take 1 tablet (15 mg total) by mouth at bedtime.  silodosin (RAPAFLO) 4 MG CAPS capsule Take 1 capsule (4 mg total) by mouth daily with breakfast.  traMADol (ULTRAM) 50 MG tablet Take 1 tablet (50 mg total) by mouth every 8 (eight) hours as needed for moderate pain.  MULTAQ 400 MG tablet TAKE 1 TABLET TWICE A DAY  WITH A MEAL     Social History   Social History  . Marital Status: Widowed    Spouse Name: N/A  . Number of Children: N/A  . Years of Education: N/A   Occupational History  . retired    Social History Main Topics  . Smoking status: Former Smoker -- 1.00 packs/day for 20 years    Types: Cigarettes    Quit date: 11/17/1969  . Smokeless tobacco: Never Used  . Alcohol Use: No     Comment: none since 1990  . Drug Use: No  . Sexual Activity: Not on file   Other Topics Concern  . Not on file   Social History Narrative   He is going to [facility] in [place].    Family Status  Relation Status Death Age  . Mother Deceased   . Father Deceased    Family History  Problem Relation Age of Onset  . Colon cancer Brother 34  . Hypertension Brother   . Hypertension Father   . Parkinsonism Mother   . Hodgkin's lymphoma Grandchild      ROS:  Full 14 point review of systems complete and found to be negative unless listed above.  Physical Exam: Blood pressure 140/59, pulse 75, temperature 98.2 F (36.8 C), temperature source Oral, resp. rate 20, height 5\' 8"  (1.727 m), weight 194 lb 14.2 oz (88.4 kg), SpO2 98 %.  General: Well developed, well nourished, male in no acute distress Head: Eyes PERRLA, No xanthomas.    Normocephalic and atraumatic, oropharynx without edema or exudate.  Lungs: clear. Healthy left subclavian pacemaker site Heart: regular rate and rhythm with S1, paradoxically split S2.  No murmur. pulses are 2+ extrem.   Neck: No carotid bruits. No lymphadenopathy.  JVD. Abdomen: Bowel sounds present, weak, , abdomen distended without masses or hernias noted. Msk:  No spine or cva tenderness. No weakness, no joint deformities or effusions. Extremities: No clubbing or cyanosis.  edema.  Neuro: Alert and oriented X 3. No focal deficits noted. Psych:  Good affect, responds appropriately Skin: No rashes or lesions noted.  Labs:   Lab Results  Component Value Date   WBC 7.0 08/22/2015   HGB 12.7* 08/22/2015   HCT 37.0* 08/22/2015   MCV 90.9 08/22/2015   PLT 168 08/22/2015   No results for input(s): INR in the last 72 hours.  Recent Labs Lab 08/20/15 0525  08/23/15 0514  NA 127*  < > 136  K 3.5  < > 3.5  CL 92*  < > 104  CO2 24  < > 24  BUN 24*  < > 10  CREATININE 1.37*  < > 1.18  CALCIUM 8.7*  < > 8.2*  PROT 6.2*  --   --   BILITOT 1.0  --   --   ALKPHOS 39  --   --   ALT 17  --   --   AST 33  --   --   GLUCOSE 136*  < > 99  ALBUMIN 3.6  --   --   < > = values in this interval not displayed.   LIPASE  Date/Time Value Ref Range Status  08/20/2015 05:25 AM 11* 22 - 51 U/L Final    ECG:  08/20/2015 AV pacing  Radiology:  Dg Abd 1 View  08/23/2015   CLINICAL DATA:  Abdominal pain and distension  EXAM: ABDOMEN - 1 VIEW  COMPARISON:  08/20/2015  FINDINGS: Scattered large and small bowel gas is noted. Persistent and slightly increased small bowel dilatation is noted. The stomach is distended with air. No abnormal mass or abnormal calcifications are noted. Postsurgical changes in the left hip are noted. No free air is noted.  IMPRESSION: Increasing small bowel distention when compared with the prior exam consistent with a progressive partial small bowel obstruction. No free air  is seen.   Electronically Signed   By: Inez Catalina M.D.   On: 08/23/2015 14:56    ASSESSMENT AND PLAN:   The patient was seen today by Dr [name], the patient evaluated and the data reviewed.  Principal Problem:   Ileus ([place]) - mgt per CCS/IM    AF (atrial fibrillation) ([place]) - pt is not currently on telemetry, but HR is regular - by last interrogation, AV pacing virtually 100% of the time - 7 mode switches, all less than 1" - think he is doing well on dronedarone and would resume once able to take po rx well.  Otherwise, per IM Active Problems:   Diverticulosis of large intestine   Benign prostatic hypertrophy (BPH) with incomplete bladder emptying   CHRONIC KIDNEY DISEASE STAGE III (MODERATE)   Anticoagulant long-term use   Hx SBO   S/Samuel appendectomy   Esophageal stricture   Uncontrollable vomiting   Small bowel obstruction (HCC)   Signed: Rosaria Ferries, PA-C 08/23/2015 3:47 PM Beeper [phone]  Co-Sign MD  I have seen and examined the patient along with Barrett, Rhonda, PA-C.  I have reviewed the chart, notes and new data.  I agree with PA's note.  Key new complaints: no palpitations, dyspnea or angina Key examination changes: abdominal distention Key new findings / data: 100% AV paced  PLAN: He has had a very low burden of atrial fibrillation in the 3 years since his last cardioversion and has tolerated dronedarone well. He has never had CHF. When able to take PO dronedarone again, would resume it. The risk of recurrent AF is low enough that I would not recommend using a parenteral agent during the time that he is NPO.  Continue anticoagulation. If unable to use PO Xarelto, recommend temporary IV heparin.  If surgery is contemplated (hopefully not necessary), his CV surgical risk is slightly increased, mostly due to his advanced age. He is not technically pacemaker dependent, but has an extremely slow escape rhythm. If surgery is planned, might be best to program the  pacemaker to asynchronous mode before surgery.  Sanda Klein, MD, [place] [phone] 08/23/2015, 4:39 PM

## 2015-08-23 NOTE — Consult Note (Signed)
Reason for Consult: Ileus vs SBO Referring Physician: Dr. Verlon Au  [name] is an 81 y.o. male.   HPI: Pt presented to the Ed on 08/20/15 with nausea, vomiting and diarrhea for 48 hours.   He was afebrile, VSS. Labs showed Na down, mild renal insuffiencey, mild glucose elevation. Negative troponins, and normal CBC.  Xray  Shows:  Small bowel distention with air-fluid levels. Moderate amount of stool and air in the large bowel; findings suggest ileus. Mild cardiomegaly, small right pleural effusion, and left lung atelectasis. He was admitted to medicine and treated conservatively. He was up to a diet and last PM he started getting distended again. Film today reveals Increasing small bowel distention when compared with the prior exam consistent with a progressive partial small bowel obstruction. No free air is seen.  Electrolytes are back to normal He did better after admit, was having BM's on 08/21/15, was restarted on Xarelto.  His diet had been advanced, but he is back to NPO. I/O shows he took at least 480 yesterday and 240 this AM before going back to the NPO status.  He has 2 stools recorded for yesterday, and reports one this AM. He remains afebrile, and VSS. He is very distended and uncomfortable.  Attempts at NG placement on admit failed and led to bleeding on Xarelto.   We are ask to see.   Past Medical History  Diagnosis Date  Overactive bladder   Arthritis   Diverticulosis of colon (without mention of hemorrhage)   Gout   Chronic kidney disease, stage III (moderate)   Family history of malignant neoplasm of gastrointestinal tract   Stricture and stenosis of esophagus   Acute gastritis without mention of hemorrhage   Unspecified cataract   Hypertrophy of prostate with urinary obstruction and other lower urinary tract symptoms (LUTS)   Personal history of colonic polyps 10/21/2006   hyperplastic   Vitamin B12 deficiency   Hiatal hernia   Type II or unspecified type diabetes  mellitus without mention of complication, not stated as uncontrolled    pt states that he checks his blood sugar once weekly, is not on oral meds or insulin  Atrial fibrillation (HCC)   Sinus node dysfunction (HCC)   Presence of permanent cardiac pacemaker 10/14/05   medtronic Adapta  Hypertension   Esophageal dysmotility   Second degree AV block 11/29/2014  Environmental allergies    HISTORY OF  GERD (gastroesophageal reflux disease)   History of skin cancer   Chronic constipation   Small bowel obstruction (HCC)     Past Surgical History  Procedure Laterality Date  . Appendectomy    . Tonsillectomy    . Pacemaker insertion  replaced 2 months ago, total of 8 years with pacemaker  . Total hip arthroplasty Bilateral   . Cardioversion  10/01/2011    Procedure: CARDIOVERSION;  Surgeon: Dani Gobble [name];  Location: [facility] OR;  Service: Cardiovascular;  Laterality: N/A;  OP CARDIOVERSION  . Cardioversion  11/02/12    successful  . Eye surgery Bilateral years ago    cataract lens replacment  . Esophagogastroduodenoscopy (egd) with propofol N/A 09/22/2014    Procedure: ESOPHAGOGASTRODUODENOSCOPY (EGD) WITH PROPOFOL;  Surgeon: Jerene Bears, MD;  Location: [facility];  Service: Gastroenterology;  Laterality: N/A;  . Savory dilation N/A 09/22/2014    Procedure: SAVORY DILATION;  Surgeon: Jerene Bears, MD;  Location: [facility]OSCOPY;  Service: Gastroenterology;  Laterality: N/A;  . Permanent pacemaker generator change N/A 03/14/2014  Procedure: PERMANENT PACEMAKER GENERATOR CHANGE;  Surgeon: Sanda Klein, MD;  Location: [place] CATH LAB;  Service: Cardiovascular;  Laterality: N/A;    Family History  Problem Relation Age of Onset  . Colon cancer Brother 59  . Hypertension Brother   . Hypertension Father   . Parkinsonism Mother   . Hodgkin's lymphoma Grandchild     Social History:  reports that he quit smoking about 45 years ago. His smoking use included Cigarettes. He has a 20 pack-year smoking  history. He has never used smokeless tobacco. He reports that he does not drink alcohol or use illicit drugs.  Allergies:  Allergies  Allergen Reactions  . Codeine     REACTION: nausea    Medications:  Prior to Admission:  Prescriptions prior to admission  Medication Sig Dispense Refill Last Dose  . beta carotene w/minerals (OCUVITE) tablet Take 1 tablet by mouth every morning.    08/18/2015  . ciprofloxacin-hydrocortisone (CIPRO HC) otic suspension Place 3 drops into the right ear 2 (two) times daily. 10 mL 0 08/19/2015  . nebivolol (BYSTOLIC) 10 MG tablet Take 1 tablet (10 mg total) by mouth every morning. 90 tablet 3 08/18/2015 at 0800  . OVER THE COUNTER MEDICATION Place 1 each into both ears as needed (for allergies.). Ear drops.   08/18/2015  . pantoprazole (PROTONIX) 40 MG tablet Take 1 tablet (40 mg total) by mouth daily before breakfast. 90 tablet 2 08/18/2015  . Probiotic Product (PHILLIPS COLON HEALTH PO) Take 1 tablet by mouth every morning.    08/18/2015  . ramipril (ALTACE) 2.5 MG capsule Take 1 capsule (2.5 mg total) by mouth every morning. 90 capsule 3 08/18/2015  . Rivaroxaban (XARELTO) 15 MG TABS tablet Take 1 tablet (15 mg total) by mouth at bedtime. 90 tablet 3 08/19/2015  . silodosin (RAPAFLO) 4 MG CAPS capsule Take 1 capsule (4 mg total) by mouth daily with breakfast. 90 capsule 3 08/18/2015  . traMADol (ULTRAM) 50 MG tablet Take 1 tablet (50 mg total) by mouth every 8 (eight) hours as needed for moderate pain. 90 tablet 3 08/18/2015   Scheduled: . nebivolol  10 mg Oral [id number]  . pantoprazole  40 mg Oral QAC breakfast  . ramipril  2.5 mg Oral q morning - 10a  . Rivaroxaban  15 mg Oral Q supper  . silodosin  8 mg Oral Q breakfast  . sodium chloride  3 mL Intravenous Q12H   Continuous:  KGM:WNUUVO chloride, ondansetron, sodium chloride Anti-infectives    None      Results for orders placed or performed during the hospital encounter of 08/20/15 (from the past 48  hour(s))  Basic metabolic panel     Status: Abnormal   Collection Time: 08/22/15  1:20 PM  Result Value Ref Range   Sodium 130 (L) 135 - 145 mmol/L   Potassium 3.3 (L) 3.5 - 5.1 mmol/L   Chloride 98 (L) 101 - 111 mmol/L   CO2 25 22 - 32 mmol/L   Glucose, Bld 115 (H) 65 - 99 mg/dL   BUN 8 6 - 20 mg/dL   Creatinine, Ser 1.07 0.61 - 1.24 mg/dL   Calcium 7.8 (L) 8.9 - 10.3 mg/dL   GFR calc non Af Amer 58 (L) >60 mL/min   GFR calc Af Amer >60 >60 mL/min    Comment: (NOTE) The eGFR has been calculated using the CKD EPI equation. This calculation has not been validated in all clinical situations. eGFR's persistently <60 mL/min signify possible Chronic  Kidney Disease.    Anion gap 7 5 - 15  CBC     Status: Abnormal   Collection Time: 08/22/15  1:20 PM  Result Value Ref Range   WBC 7.0 4.0 - 10.5 K/uL   RBC 4.07 (L) 4.22 - 5.81 MIL/uL   Hemoglobin 12.7 (L) 13.0 - 17.0 g/dL   HCT 37.0 (L) 39.0 - 52.0 %   MCV 90.9 78.0 - 100.0 fL   MCH 31.2 26.0 - 34.0 pg   MCHC 34.3 30.0 - 36.0 g/dL   RDW 13.9 11.5 - 15.5 %   Platelets 168 150 - 400 K/uL  Basic metabolic panel     Status: Abnormal   Collection Time: 08/23/15  5:14 AM  Result Value Ref Range   Sodium 136 135 - 145 mmol/L   Potassium 3.5 3.5 - 5.1 mmol/L   Chloride 104 101 - 111 mmol/L   CO2 24 22 - 32 mmol/L   Glucose, Bld 99 65 - 99 mg/dL   BUN 10 6 - 20 mg/dL   Creatinine, Ser 1.18 0.61 - 1.24 mg/dL   Calcium 8.2 (L) 8.9 - 10.3 mg/dL   GFR calc non Af Amer 51 (L) >60 mL/min   GFR calc Af Amer 59 (L) >60 mL/min    Comment: (NOTE) The eGFR has been calculated using the CKD EPI equation. This calculation has not been validated in all clinical situations. eGFR's persistently <60 mL/min signify possible Chronic Kidney Disease.    Anion gap 8 5 - 15    Dg Abd 1 View  08/23/2015   CLINICAL DATA:  Abdominal pain and distension  EXAM: ABDOMEN - 1 VIEW  COMPARISON:  08/20/2015  FINDINGS: Scattered large and small bowel gas is  noted. Persistent and slightly increased small bowel dilatation is noted. The stomach is distended with air. No abnormal mass or abnormal calcifications are noted. Postsurgical changes in the left hip are noted. No free air is noted.  IMPRESSION: Increasing small bowel distention when compared with the prior exam consistent with a progressive partial small bowel obstruction. No free air is seen.   Electronically Signed   By: Inez Catalina M.D.   On: 08/23/2015 14:56    Review of Systems  Constitutional: Negative.   HENT: Positive for hearing loss.        Hearing aide  Eyes: Negative.   Respiratory: Positive for cough and wheezing. Negative for hemoptysis and sputum production (DOE/PND/ orthopnea, daughter in law thinks he may have sleep apnea.).   Cardiovascular: Positive for chest pain, palpitations, orthopnea, leg swelling and PND.  Gastrointestinal: Positive for heartburn (treated daily), nausea, vomiting (he had nausea and vomiting on admit, but none since, even with re expansion of his abdomen.), abdominal pain, diarrhea (on admit, not now..  last BM this AM) and constipation (Hx of chronic constipaion take Miralax daily for this). Negative for blood in stool and melena.  Genitourinary: Positive for urgency and frequency.       Wears depends   Musculoskeletal: Negative.   Skin: Negative.   Neurological: Negative.   Endo/Heme/Allergies: Bruises/bleeds easily (on xarelto).  Psychiatric/Behavioral: Negative.    Blood pressure 140/59, pulse 75, temperature 98.2 F (36.8 C), temperature source Oral, resp. rate 20, height 5' 8" (1.727 m), weight 88.4 kg (194 lb 14.2 oz), SpO2 98 %. Physical Exam  Constitutional: He is oriented to person, place, and time. No distress.  Elderly WM with marked abdominal distension.  Fairly uncomfortable with sx.  HENT:  Head:  Normocephalic and atraumatic.  Nose: Nose normal.  He had bleeding from his nose with attempts to place NG on admit.  Eyes:  Conjunctivae and EOM are normal. Right eye exhibits no discharge. Left eye exhibits no discharge. No scleral icterus.  Neck: Normal range of motion. Neck supple. No JVD present. No tracheal deviation present. No thyromegaly present.  Cardiovascular: Normal rate, regular rhythm, normal heart sounds and intact distal pulses.   No murmur heard. Respiratory: Effort normal and breath sounds normal. No respiratory distress. He has no wheezes. He has no rales. He exhibits no tenderness.  GI: He exhibits distension (Very distended with high pitched ). He exhibits no mass. There is no tenderness. There is no rebound and no guarding.  Very high pitched BS.  Musculoskeletal: He exhibits no edema.  Lymphadenopathy:    He has no cervical adenopathy.  Neurological: He is alert and oriented to person, place, and time. No cranial nerve deficit. Coordination normal.  Skin: Skin is warm and dry. No rash noted. He is not diaphoretic. No erythema. No pallor.  Psychiatric: He has a normal mood and affect. His behavior is normal. Judgment and thought content normal.    Assessment/Plan: Ileus/SBO Hx of prior SBO, open appendectomy, possible laparoscopy with SBO in the past, but I cannot find records of it. Atrial fibrillation on Xarelto BPH Chronic kidney disease Hypertension Hx of esophageal stricture/dysmotility Resolved hyponatremia  Plan:  He needs an NG tube, so I will hold the Xarelto and ask IR to place the tube tomorrow.  I will defer hydration to Medicine.  Keep him NPO and continue bowel rest.  I    Correll Denbow 08/23/2015, 3:34 PM

## 2015-08-23 NOTE — Progress Notes (Signed)
. Family Medicine Teaching Service Daily Progress Note Intern Pager: [phone]  Patient name: [name] Medical record number: [id number] Date of birth: January 17, 1920 Age: 81 y.o. Gender: male  Primary Care Provider: [name], MD Consultants: None Code Status: DNR  Pt Overview and Major Events to Date:  10/1: CT Abdomen Pelvis 10/3: DG Abd Acute w/Chest  Assessment and Plan: [name] is a 81 y.o. male presenting with nausea and vomiting with radiographic evidence of ileus, now clinically resolved. PMH is significant for history of ilues, appendectomy, esophageal stricture, diverticulosis, DNR, AFib on xarelto with PPM, stage III CKD, and BPH.  Ileus, resolved: Air-fluid levels in small bowel on CT scan. Now with daily BMs, passing flatus. Pain, N/V resolved. - Diet advanced to full heart healthy diet, eating >50% of meals - All medications PO - d/c Maintenance IVF - Xarelto restarted, d/c heparin - PRNs, none needed in 72h: Zofran 4mg  IV q8h prn nausea, morphine 1mg  IV prn abdominal pain  BPH, controlled: Restarted Rapaflo with decreased retention but continued severe nocturia symptoms. Previously failed Flomax. Discontinue elavil, though he denies taking this. - Strict I/O - Rapaflo restarted - No hydronephrosis on CT, bladder scan <361mL  Stage III CKD: At/below historical baseline - d/c MIVF - Advanced to full diet, patient tolerating PO well  Hyponatremia, resolved: Mild, correct gradually with isotonic IVF and now increased PO intake.  - Na 136-->127-->133-->130-->136 - Continue to encourage PO intake  Paroxysmal atrial fibrillation/HTN: s/p cardioversion, now with PPM in situ, on dronedarone and bystolic. Followed by Dr. [name]. Troponin neg x3. ECG shows dual paced rhythm so offers little insight into cardiac ischemia. Last echo was 2006 with preserved EF and currently euvolemic.  - Restarted home nebivolol and dronedarone - Restarted Xarelto, d/c heparin -  Pt to restart home Multaq on discharge  Esophageal stricture/dysmotility: Not thought to be contributing to presentation. Followed by Dr. Hilarie Fredrickson, last EGD with dilation Nov 2015 otherwise noted to be normal. - No intervention planned.  FEN/GI: Heart healthy diet, advance as tolerated. Prophylaxis: Heparin per pharmacy for therapeutic anticoagulation until we can transition back to xarelto.  Disposition: SNF, pt has bed available at [facility] today 10/6 per SW  Subjective:  No acute events overnight. Patient reports that he continues to feel better this morning, with improved appetite and decreased bloating. BM x2 overnight, passing flatus, no emesis since admission. Good tolerance of PO. Patient in agreement with discharge to SNF this afternoon.  Objective: Temp:  [98 F (36.7 C)-98.3 F (36.8 C)] 98.3 F (36.8 C) (10/06 0600) Pulse Rate:  [69-95] 75 (10/06 0600) Resp:  [18-20] 20 (10/06 0600) BP: (129-167)/(61-81) 167/81 mmHg (10/06 0600) SpO2:  [94 %-100 %] 100 % (10/06 0600) Physical Exam: General: Alert, comfortably lying in bed Cardiovascular: RRR Respiratory: CTAB Abdomen: frequent bowel sounds, soft, nontender, distended but obese abdomen, exam limited by body habitus.  Laboratory:  Recent Labs Lab 08/20/15 0525 08/21/15 0140 08/22/15 1320  WBC 4.7 6.4 7.0  HGB 13.4 13.0 12.7*  HCT 39.6 38.5* 37.0*  PLT 179 194 168    Recent Labs Lab 08/18/15 1026 08/20/15 0525 08/21/15 0140 08/22/15 1320 08/23/15 0514  NA 136 127* 133* 130* 136  K 5.2* 3.5 3.8 3.3* 3.5  CL 102 92* 102 98* 104  CO2 26 24 23 25 24   BUN 19 24* 22* 8 10  CREATININE 1.48* 1.37* 1.34* 1.07 1.18  CALCIUM 9.2 8.7* 8.3* 7.8* 8.2*  PROT 6.4* 6.2*  --   --   --  BILITOT 0.9 1.0  --   --   --   ALKPHOS 46 39  --   --   --   ALT 15* 17  --   --   --   AST 19 33  --   --   --   GLUCOSE 154* 136* 128* 115* 99    Imaging/Diagnostic Tests: DG ABDOMEN ACUTE W/ 1V CHEST: Small bowel  distention with air-fluid levels. Moderate amount of stool and air in the large bowel; findings suggest ileus.  CT Abd Pelvis wo contrast: Gastric and proximal small bowel distention with air and contrast, nonspecific. No definite obstruction pattern. Negative for free air.  [name], Med Student 08/23/2015, 8:59 AM    Resident Addendum  [name] is a 81 y.o. male presenting with nausea and vomiting with radiographic evidence of ileus, now clinically resolved. PMH is significant for history of ilues, appendectomy, esophageal stricture, diverticulosis, DNR, AFib on xarelto with PPM, stage III CKD, and BPH.  S: Pt states today he feels well, has had a BM this AM. Denies nausea/emesis, abd pain. Deneis SOB, chest pain   Exam:  BP 167/81 mmHg  Pulse 75  Temp(Src) 98.3 F (36.8 C) (Oral)  Resp 20  Ht 5\' 8"  (1.727 m)  Wt 194 lb 14.2 oz (88.4 kg)  BMI 29.64 kg/m2  SpO2 100%  Gen: Sitting up in bed, NAD CV: RRR Pulm; CTAB Abd: mildly distended, non tender, + BS Extremities: non tender LE, no LE edema   A/P   Illeus- Resolving however still has distended abd - obtain KUB - continue reg diet  - d/c MIVF   BPH- stable with good UOP continue home rapaflo  CKD- Stable, 1.18 today  - follow as needed - follow I/Os  H/o afib/paced - continue home Xarelto - continue home nebivolol - question about home dronedrone, will plan to continue as an outpatient  Hyponatremia- Improving to 133 - Will follow today - continue to trend  Weakness-  - PT/OT- SNF - SW following as well and SNF ([facility])  Dispo: SNF when bed available, and clinically stable   Lateesha Bezold A. Lincoln Brigham MD, [facility] Resident PGY-2 Pager [phone]

## 2015-08-23 NOTE — Care Management Important Message (Signed)
Important Message  Patient Details  Name: [name] MRN: [id number] Date of Birth: 1920/06/29   Medicare Important Message Given:  Yes-second notification given    Delorse Lek 08/23/2015, 1:20 PM

## 2015-08-23 NOTE — Care Management Note (Signed)
Case Management Note  Patient Details  Name: ZAMEER BORMAN MRN: [id number] Date of Birth: [birth year]  Subjective/Objective:                    Action/Plan: UR updated   Expected Discharge Date:                  Expected Discharge Plan:  Skilled Nursing Facility  In-House Referral:  Clinical Social Work  Discharge planning Services     Post Acute Care Choice:    Choice offered to:     DME Arranged:    DME Agency:     HH Arranged:    [place] Agency:     Status of Service:  Completed, signed off  Medicare Important Message Given:    Date Medicare IM Given:    Medicare IM give by:    Date Additional Medicare IM Given:    Additional Medicare Important Message give by:     If discussed at [place] of Stay Meetings, dates discussed:    Additional Comments:  Marilu Favre, RN 08/23/2015, 8:33 AM

## 2015-08-23 NOTE — Progress Notes (Signed)
Physical Therapy Treatment Patient Details Name: [name] MRN: [id number] DOB: 1920/09/06 Today's Date: 08/23/2015    History of Present Illness [name] is a 81 y.o. male presenting with nausea and vomiting with radiographic evidence of ileus. PMH is significant for history of ilues, appendectomy, esophageal stricture, diverticulosis, DNR, AFib on xarelto with PPM, stage III CKD, and BPH.    PT Comments    Pt continues to need cues for safety with transfers.  Weak knees noted during gait, but no LOB.  Con't to recommend SNF.  Follow Up Recommendations  SNF     Equipment Recommendations  None recommended by PT    Recommendations for Other Services       Precautions / Restrictions Precautions Precautions: Fall    Mobility  Bed Mobility               General bed mobility comments: sitting up in recliner upon arrival  Transfers Overall transfer level: Needs assistance Equipment used: Rolling walker (2 wheeled) Transfers: Sit to/from Stand Sit to Stand: Min assist         General transfer comment: Pt needs cues to keep RW with him prior to sitting.  Puts it next to chair first.  Ambulation/Gait Ambulation/Gait assistance: Min guard Ambulation Distance (Feet): 120 Feet Assistive device: Rolling walker (2 wheeled) Gait Pattern/deviations: Step-through pattern;Decreased stance time - right Gait velocity: decreased   General Gait Details: Weak knees at times, but no buckeling.   Stairs            Wheelchair Mobility    Modified Rankin (Stroke Patients Only)       Balance     Sitting balance-Leahy Scale: Good       Standing balance-Leahy Scale: Poor                      Cognition Arousal/Alertness: Awake/alert Behavior During Therapy: WFL for tasks assessed/performed Overall Cognitive Status: Within Functional Limits for tasks assessed                      Exercises General Exercises - Lower Extremity Ankle  Circles/Pumps: AROM;Both;10 reps;Seated Long Arc Quad: Strengthening;Both;10 reps;Seated Hip ABduction/ADduction: Strengthening;Both;5 reps;Seated Hip Flexion/Marching: Strengthening;10 reps;Both;Seated    General Comments        Pertinent Vitals/Pain Pain Assessment: Faces Faces Pain Scale: Hurts a little bit Pain Location: R hip Pain Descriptors / Indicators: Sore Pain Intervention(s): Limited activity within patient's tolerance;Monitored during session    Home Living                      Prior Function            PT Goals (current goals can now be found in the care plan section) Acute Rehab PT Goals PT Goal Formulation: With patient Time For Goal Achievement: 09/04/15 Potential to Achieve Goals: Good Progress towards PT goals: Progressing toward goals    Frequency  Min 3X/week    PT Plan Current plan remains appropriate    Co-evaluation             End of Session Equipment Utilized During Treatment: Gait belt Activity Tolerance: Patient tolerated treatment well Patient left: in chair;with call bell/phone within reach;with family/visitor present     Time: 1242-1256 PT Time Calculation (min) (ACUTE ONLY): 14 min  Charges:  $Gait Training: 8-22 mins  G Codes:      Ryane Konieczny LUBECK 08/23/2015, 1:27 PM

## 2015-08-23 NOTE — Progress Notes (Signed)
ANTICOAGULATION CONSULT NOTE - Initial Consult  Pharmacy Consult for Heparin Indication: Afib  Allergies  Allergen Reactions  . Codeine     REACTION: nausea    Patient Measurements: TBW 88.5 kg IBW 68.4 kg Heparin dosing weight: 86.4 kg  Vital Signs: Temp: 98.2 F (36.8 C) (10/06 0933) Temp Source: Oral (10/06 0933) BP: 140/59 mmHg (10/06 0933) Pulse Rate: 75 (10/06 0933)  Labs:  Recent Labs  08/21/15 0140 08/21/15 1225 08/22/15 1320 08/23/15 0514  HGB 13.0  --  12.7*  --   HCT 38.5*  --  37.0*  --   PLT 194  --  168  --   APTT 117* 93*  --   --   HEPARINUNFRC 1.50*  --   --   --   CREATININE 1.34*  --  1.07 1.18    Estimated Creatinine Clearance: 42.3 mL/min (by C-G formula based on Cr of 1.18).   Medical History: Past Medical History  Diagnosis Date  . Overactive bladder   . Arthritis   . Diverticulosis of colon (without mention of hemorrhage)   . Gout   . Chronic kidney disease, stage III (moderate)   . Family history of malignant neoplasm of gastrointestinal tract   . Stricture and stenosis of esophagus   . Acute gastritis without mention of hemorrhage   . Unspecified cataract   . Hypertrophy of prostate with urinary obstruction and other lower urinary tract symptoms (LUTS)   . Personal history of colonic polyps 10/21/2006    hyperplastic   . Vitamin B12 deficiency   . Hiatal hernia   . Type II or unspecified type diabetes mellitus without mention of complication, not stated as uncontrolled     pt states that he checks his blood sugar once weekly, is not on oral meds or insulin  . Atrial fibrillation ([place])   . Sinus node dysfunction (HCC)   . Presence of permanent cardiac pacemaker 10/14/05    medtronic Adapta  . Hypertension   . Esophageal dysmotility   . Second degree AV block 11/29/2014  . Environmental allergies     HISTORY OF  . GERD (gastroesophageal reflux disease)   . History of skin cancer   . Small bowel obstruction [facility])      Assessment: 81yo male with history of ileus, CKD3, Afib on xarelto and esophageal stricture  presents on 10/3 with emesis and nausea. Pharmacy is consulted to dose heparin for atrial fibrillation while patient is NPO.   Pt's last xarelto dose was 10/5 at 1700. Will use aPTTs to guide heparin dosing.  Goal of Therapy:  APTT 66-102 seconds Heparin level 0.3-0.7 units/ml Monitor platelets by anticoagulation protocol: Yes   Plan:  Heparin 1100 units/hr 8h aPTT Daily aPTT/HL/CBC Monitor s/sx of bleeding F/u restart of xarelto   Andrey Cota. Diona Foley, PharmD Clinical Pharmacist Pager [phone]  08/23/2015 5:17 PM

## 2015-08-24 ENCOUNTER — Inpatient Hospital Stay (HOSPITAL_COMMUNITY): Payer: Medicare Other

## 2015-08-24 DIAGNOSIS — I4891 Unspecified atrial fibrillation: Secondary | ICD-10-CM

## 2015-08-24 DIAGNOSIS — I480 Paroxysmal atrial fibrillation: Secondary | ICD-10-CM

## 2015-08-24 DIAGNOSIS — I495 Sick sinus syndrome: Secondary | ICD-10-CM

## 2015-08-24 LAB — CBC
HCT: 33.4 % — ABNORMAL LOW (ref 39.0–52.0)
HEMOGLOBIN: 11.6 g/dL — AB (ref 13.0–17.0)
MCH: 31.2 pg (ref 26.0–34.0)
MCHC: 34.7 g/dL (ref 30.0–36.0)
MCV: 89.8 fL (ref 78.0–100.0)
PLATELETS: 146 10*3/uL — AB (ref 150–400)
RBC: 3.72 MIL/uL — AB (ref 4.22–5.81)
RDW: 13.6 % (ref 11.5–15.5)
WBC: 7.9 10*3/uL (ref 4.0–10.5)

## 2015-08-24 LAB — APTT
APTT: 101 s — AB (ref 24–37)
aPTT: 94 seconds — ABNORMAL HIGH (ref 24–37)

## 2015-08-24 MED ORDER — LIDOCAINE VISCOUS 2 % MT SOLN
OROMUCOSAL | Status: AC
  Filled 2015-08-24: qty 15

## 2015-08-24 NOTE — Progress Notes (Signed)
Patient Name: [name] Date of Encounter: 08/24/2015  Principal Problem:   Ileus ([place]) Active Problems:   SICK SINUS SYNDROME   Diverticulosis of large intestine   Benign prostatic hypertrophy (BPH) with incomplete bladder emptying   CHRONIC KIDNEY DISEASE STAGE III (MODERATE)   Anticoagulant long-term use   AF (atrial fibrillation) (HCC)   Hx SBO   S/P appendectomy   Pacemaker - Medtronic Adapta 2006, gen change 2015   Esophageal stricture   Second degree AV block   Uncontrollable vomiting   Small bowel obstruction (HCC)   Abdominal distension   SBO (small bowel obstruction) ([place])   Length of Stay: 4  SUBJECTIVE  NGT placed again for abdominal distention and he feels better. Still passing gas.  CURRENT MEDS . lidocaine      . metoprolol  2.5 mg Intravenous Q12H  . pantoprazole (PROTONIX) IV  40 mg Intravenous Q12H  . sodium chloride  3 mL Intravenous Q12H    OBJECTIVE   Intake/Output Summary (Last 24 hours) at 08/24/15 1128 Last data filed at 08/24/15 0644  Gross per 24 hour  Intake   1344 ml  Output    650 ml  Net    694 ml   Filed Weights   08/22/15 0700 08/24/15 0500  Weight: 194 lb 14.2 oz (88.4 kg) 193 lb 9 oz (87.8 kg)    PHYSICAL EXAM Filed Vitals:   08/23/15 0933 08/23/15 2154 08/24/15 0500 08/24/15 0530  BP: 140/59 175/74  149/69  Pulse: 75 71  70  Temp: 98.2 F (36.8 C) 97.6 F (36.4 C)  97.3 F (36.3 C)  TempSrc: Oral Oral  Oral  Resp:  20  18  Height:      Weight:   193 lb 9 oz (87.8 kg)   SpO2: 98% 98%  99%   General: Alert, oriented x3, no distress Head: no evidence of trauma, PERRL, EOMI, no exophtalmos or lid lag, no myxedema, no xanthelasma; normal ears, nose and oropharynx Neck: normal jugular venous pulsations and no hepatojugular reflux; brisk carotid pulses without delay and no carotid bruits Chest: clear to auscultation, no signs of consolidation by percussion or palpation, normal fremitus, symmetrical and full  respiratory excursions Cardiovascular: normal position and quality of the apical impulse, regular rhythm, normal first and paradoxically split second heart sounds, no rubs or gallops, no murmur. Healthy left subclavian pacemaker site Abdomen: moderate distention, not really tender to palpation, no abnormal pulsatility or arterial bruits, normal bowel sounds, no hepatosplenomegaly Extremities: no clubbing, cyanosis or edema; 2+ radial, ulnar and brachial pulses bilaterally; 2+ right femoral, posterior tibial and dorsalis pedis pulses; 2+ left femoral, posterior tibial and dorsalis pedis pulses; no subclavian or femoral bruits Neurological: grossly nonfocal  LABS  CBC  Recent Labs  08/22/15 1320 08/24/15 0234  WBC 7.0 7.9  HGB 12.7* 11.6*  HCT 37.0* 33.4*  MCV 90.9 89.8  PLT 168 326*   Basic Metabolic Panel  Recent Labs  08/22/15 1320 08/23/15 0514  NA 130* 136  K 3.3* 3.5  CL 98* 104  CO2 25 24  GLUCOSE 115* 99  BUN 8 10  CREATININE 1.07 1.18  CALCIUM 7.8* 8.2*   Liver Function Tests  Radiology Studies Imaging results have been reviewed and Dg Abd 1 View  08/24/2015   CLINICAL DATA:  Nasogastric tube placement under fluoroscopy.  EXAM: ABDOMEN - 1 VIEW  COMPARISON:  08/23/2015  FINDINGS: Nasogastric tube passes below the diaphragm to have its tip in the mid  to distal stomach, well positioned.  IMPRESSION: Well-positioned nasogastric tube.   Electronically Signed   By: Lajean Manes M.D.   On: 08/24/2015 08:54   Dg Abd 1 View  08/23/2015   CLINICAL DATA:  Abdominal pain and distension  EXAM: ABDOMEN - 1 VIEW  COMPARISON:  08/20/2015  FINDINGS: Scattered large and small bowel gas is noted. Persistent and slightly increased small bowel dilatation is noted. The stomach is distended with air. No abnormal mass or abnormal calcifications are noted. Postsurgical changes in the left hip are noted. No free air is noted.  IMPRESSION: Increasing small bowel distention when compared with  the prior exam consistent with a progressive partial small bowel obstruction. No free air is seen.   Electronically Signed   By: Inez Catalina M.D.   On: 08/23/2015 14:56   Dg Addison Bailey G Tube Plc W/fl-no Rad  08/24/2015   CLINICAL DATA:    NASO G TUBE PLACEMENT WITH FLUORO  Fluoroscopy was utilized by the requesting physician.  No radiographic  interpretation.     ASSESSMENT AND PLAN  1. History of paroxysmal atrial fibrillation Very low prevalence (none in last 3 years on Multaq). No plan for IV meds while unable to take Multaq, unless atrial fibrillation actually develops and he has RVR.  2. Dual chamber pacemaker for sinus bradycardia and second degree AV block/periods of complete heart block Should be considered pacemaker dependent. Normal device function.  3. Small bowel obstruction Hopefully will improve with conservative management. Resume Xarelto and Multaq when he is able to take PO meds.   Sanda Klein, MD, [facility] [phone] office [phone] pager 08/24/2015 11:28 AM

## 2015-08-24 NOTE — Progress Notes (Signed)
Subjective:  He is better this AM, he also just got his NG tube so it has not been a part of this.  He said he has had some flatus and some stool last PM.    Objective: Vital signs in last 24 hours: Temp:  [97.3 F (36.3 C)-98.2 F (36.8 C)] 97.3 F (36.3 C) (10/07 0530) Pulse Rate:  [70-75] 70 (10/07 0530) Resp:  [18-20] 18 (10/07 0530) BP: (140-175)/(59-74) 149/69 mmHg (10/07 0530) SpO2:  [98 %-99 %] 99 % (10/07 0530) Weight:  [87.8 kg (193 lb 9 oz)] 87.8 kg (193 lb 9 oz) (10/07 0500) Last BM Date: 08/23/15 240 PO yesterday 950 urine No BM recorded Afebrile, VSS CBC OK platelets down some Film this Am is a chest xray, but tube is well positioned, I will get a film tomorrow  Intake/Output from previous day: 10/06 0701 - 10/07 0700 In: 1584 [P.O.:240; I.V.:1344] Out: 950 [Urine:950] Intake/Output this shift:    General appearance: alert, cooperative, no distress and still a little sleepy GI: soft, still distended but much less than last PM when I saw him. BS still hyperactive. + Flatus and BM per pt report.  Lab Results:   Recent Labs  08/22/15 1320 08/24/15 0234  WBC 7.0 7.9  HGB 12.7* 11.6*  HCT 37.0* 33.4*  PLT 168 146*    BMET  Recent Labs  08/22/15 1320 08/23/15 0514  NA 130* 136  K 3.3* 3.5  CL 98* 104  CO2 25 24  GLUCOSE 115* 99  BUN 8 10  CREATININE 1.07 1.18  CALCIUM 7.8* 8.2*   PT/INR No results for input(s): LABPROT, INR in the last 72 hours.   Recent Labs Lab 08/18/15 1026 08/20/15 0525  AST 19 33  ALT 15* 17  ALKPHOS 46 39  BILITOT 0.9 1.0  PROT 6.4* 6.2*  ALBUMIN 3.8 3.6     Lipase     Component Value Date/Time   LIPASE 11* 08/20/2015 0525     Studies/Results: Dg Abd 1 View  08/23/2015   CLINICAL DATA:  Abdominal pain and distension  EXAM: ABDOMEN - 1 VIEW  COMPARISON:  08/20/2015  FINDINGS: Scattered large and small bowel gas is noted. Persistent and slightly increased small bowel dilatation is noted. The stomach  is distended with air. No abnormal mass or abnormal calcifications are noted. Postsurgical changes in the left hip are noted. No free air is noted.  IMPRESSION: Increasing small bowel distention when compared with the prior exam consistent with a progressive partial small bowel obstruction. No free air is seen.   Electronically Signed   By: Inez Catalina M.D.   On: 08/23/2015 14:56    Medications: . lidocaine      . metoprolol  2.5 mg Intravenous Q12H  . pantoprazole (PROTONIX) IV  40 mg Intravenous Q12H  . sodium chloride  3 mL Intravenous Q12H   . dextrose 5 % and 0.9% NaCl 100 mL/hr at 08/24/15 0437  . heparin 1,100 Units/hr (08/23/15 1820)    Assessment/Plan Ileus/SBO Hx of prior SBO, open appendectomy, possible laparoscopy with SBO in the past, but I cannot find records of it. Atrial fibrillation on Xarelto BPH Chronic kidney disease Hypertension Hx of esophageal stricture/dysmotility Resolved hyponatremia Antibiotics:  None DVT:  Heparin drip/SCD's added  Plan:  i think a couple days of NG decompression will help.  Her can have ice chips as long as NG is working. Recheck film in AM.  Recheck labs in AM.  LOS: 4 days    Carlette Palmatier 08/24/2015

## 2015-08-24 NOTE — Progress Notes (Signed)
[place] for Heparin Indication: Afib  Allergies  Allergen Reactions  . Codeine     REACTION: nausea    Patient Measurements: TBW 88.5 kg IBW 68.4 kg Heparin dosing weight: 86.4 kg  Vital Signs: Temp: 97.3 F (36.3 C) (10/07 0530) Temp Source: Oral (10/07 0530) BP: 149/69 mmHg (10/07 0530) Pulse Rate: 70 (10/07 0530)  Labs:  Recent Labs  08/22/15 1320 08/23/15 0514 08/24/15 0234 08/24/15 1056  HGB 12.7*  --  11.6*  --   HCT 37.0*  --  33.4*  --   PLT 168  --  146*  --   APTT  --   --  94* 101*  CREATININE 1.07 1.18  --   --     Estimated Creatinine Clearance: 42.2 mL/min (by C-G formula based on Cr of 1.18).   Assessment: 81yo male with history of ileus, CKD3, Afib on xarelto and esophageal stricture  presents on 10/3 with emesis and nausea. Pharmacy is consulted to dose heparin for atrial fibrillation while patient is NPO for a 2nd time on 10/6 PM. Last xarelto dose 10/5 at 1649. Heparin drip was started at 1100 units/hr.  First aPTT = 93, 2nd 101 sec on 1100 units/hr.  APTT at high end of desired range.  No bleeding reported. Hg 12.7>11.6, plct 169>146.  Downward trend.   Goal of Therapy:  APTT 66-102 seconds Heparin level 0.3-0.7 units/ml Monitor platelets by anticoagulation protocol: Yes   Plan:  Decrease heparin drip to 1000 units/hr Daily aPTT/HL/CBC Monitor s/sx of bleeding F/u restart of xarelto after a few days of NG decompression for ileus/SBO.   Eudelia Bunch, Pharm.D. [phone] 08/24/2015 1:50 PM

## 2015-08-24 NOTE — Clinical Social Work Note (Signed)
CSW continuing to follow and assist with patient's discharge to [facility] once medically stable.  Lubertha Sayres, [place] Clinical Social Work Department Orthopedics [phone]) and Surgical [phone])

## 2015-08-24 NOTE — Progress Notes (Signed)
ANTICOAGULATION CONSULT NOTE - Follow Up Consult  Pharmacy Consult for heparin Indication: atrial fibrillation   Labs:  Recent Labs  08/21/15 1225 08/22/15 1320 08/23/15 0514 08/24/15 0234  HGB  --  12.7*  --  11.6*  HCT  --  37.0*  --  33.4*  PLT  --  168  --  146*  APTT 93*  --   --  94*  CREATININE  --  1.07 1.18  --       Assessment/Plan:  81yo male therapeutic on heparin with initial dosing for Afib while Xarelto on hold. Will continue gtt at current rate and confirm stable with additional PTT.   Wynona Neat, PharmD, BCPS  08/24/2015,3:23 AM

## 2015-08-24 NOTE — Progress Notes (Signed)
Pt. Seen by myself (overnight resident) and says he feels somewhat better. Distension is improving. He would like some ice chips. No nausea or vomiting at this time. Says he normally has slow bowels at baseline and requires miralax daily to keep himself regular. He has no complaints or requests at this time.   A/P: 81 y/o M with improving SBO.  - Continue NG tube to suction. Hopefully can clamp tomorrow and see if his SBO is truly improving.  - No further recommendations at this time.  - Continue current care.  - Will see in the am

## 2015-08-24 NOTE — Progress Notes (Addendum)
. Family Medicine Teaching Service Daily Progress Note Intern Pager: [phone]  Patient name: [name] Medical record number: [id number] Date of birth: 02/17/1920 Age: 82 y.o. Gender: male  Primary Care Provider: [name], MD Consultants: None Code Status: DNR  Pt Overview and Major Events to Date:  10/1: CT Abdomen Pelvis 10/3: DG Abd Acute w/Chest 10/6: DG Abd 1 View 10/7: NG tube placed  Assessment and Plan: [name] is a 81 y.o. male presenting with nausea and vomiting with radiographic evidence of ileus, now clinically resolved. PMH is significant for history of ilues, appendectomy, esophageal stricture, diverticulosis, DNR, AFib on xarelto with PPM, stage III CKD, and BPH.  Partial SBO: Air-fluid levels in small bowel on CT scan. Now with daily BMs, passing flatus. Pain, N/V resolved. Partial progressive SBO on KUB with continued distention, now with NG tube. - NG tube decompression - NPO, with ice up to 4oz q2h per surgery - MIVF - hold Xarelto, restart heparin - PRNs, none needed in 72h: Zofran 4mg  IV q8h prn nausea, morphine 1mg  IV prn abdominal pain - All meds PO-->IV  BPH, controlled: Restarted Rapaflo with decreased retention but continued severe nocturia symptoms. Previously failed Flomax. Discontinue elavil, though he denies taking this. No hydronephrosis on CT, bladder scan <381mL - Strict I/O - Hold Rapaflo while NPO  Stage III CKD: At/below historical baseline - MIVF  Hyponatremia, resolved: Mild, correct gradually with isotonic IVF and now increased PO intake.  - Na 136-->127-->133-->130-->136 - MIVF  Paroxysmal atrial fibrillation/HTN: s/p cardioversion, now with PPM in situ, on dronedarone and bystolic. Followed by Dr. [name]. Troponin neg x3. ECG shows dual paced rhythm so offers little insight into cardiac ischemia. Last echo was 2006 with preserved EF and currently euvolemic.  - Hold home nebivolol and dronedarone while NPO, restart both  when PO per cards - Metoprolol IV while NPO - Hold Xarelto, restart heparin  Esophageal stricture/dysmotility: Not thought to be contributing to presentation. Followed by Dr. Hilarie Fredrickson, last EGD with dilation Nov 2015 otherwise noted to be normal. - No intervention planned.  FEN/GI: NPO, MIVF, ice chips Prophylaxis: Heparin per pharmacy for therapeutic anticoagulation until we can transition back to Xarelto.  Disposition: SNF, pt requested [facility]  Subjective:  No acute events overnight. Patient reports that he continues to feel better this morning, with decreased bloating. Small BM x1 overnight per patient (not charted), passing flatus, denies nausea, no emesis since admission.  Objective: Temp:  [97.3 F (36.3 C)-98.2 F (36.8 C)] 97.3 F (36.3 C) (10/07 0530) Pulse Rate:  [70-75] 70 (10/07 0530) Resp:  [18-20] 18 (10/07 0530) BP: (140-175)/(59-74) 149/69 mmHg (10/07 0530) SpO2:  [98 %-99 %] 99 % (10/07 0530) Weight:  [193 lb 9 oz (87.8 kg)] 193 lb 9 oz (87.8 kg) (10/07 0500) Physical Exam: General: Alert, comfortably lying in bed, NG tube in place Cardiovascular: RRR Respiratory: CTAB Abdomen: frequent bowel sounds, soft, distended but obese abdomen, mildly tender to deep palpation in RUQ but nontender elsewhere.  Laboratory:  Recent Labs Lab 08/21/15 0140 08/22/15 1320 08/24/15 0234  WBC 6.4 7.0 7.9  HGB 13.0 12.7* 11.6*  HCT 38.5* 37.0* 33.4*  PLT 194 168 146*    Recent Labs Lab 08/18/15 1026 08/20/15 0525 08/21/15 0140 08/22/15 1320 08/23/15 0514  NA 136 127* 133* 130* 136  K 5.2* 3.5 3.8 3.3* 3.5  CL 102 92* 102 98* 104  CO2 26 24 23 25 24   BUN 19 24* 22* 8 10  CREATININE 1.48* 1.37* 1.34* 1.07 1.18  CALCIUM 9.2 8.7* 8.3* 7.8* 8.2*  PROT 6.4* 6.2*  --   --   --   BILITOT 0.9 1.0  --   --   --   ALKPHOS 46 39  --   --   --   ALT 15* 17  --   --   --   AST 19 33  --   --   --   GLUCOSE 154* 136* 128* 115* 99    Imaging/Diagnostic  Tests:  10/6 DG Abd 1 View Increasing small bowel distention when compared with the prior exam consistent with a progressive partial small bowel obstruction. No free air is seen.  10/3 DG ABDOMEN ACUTE W/ 1V CHEST: Small bowel distention with air-fluid levels. Moderate amount of stool and air in the large bowel; findings suggest ileus.  10/1 CT Abd Pelvis wo contrast: Gastric and proximal small bowel distention with air and contrast, nonspecific. No definite obstruction pattern. Negative for free air.  [name], Med Student 08/24/2015, 9:16 AM    Resident Addendum  [name] is a 81 y.o. male presenting with nausea and vomiting with radiographic evidence of ileus, now clinically resolved. PMH is significant for history of ilues, appendectomy, esophageal stricture, diverticulosis, DNR, AFib on xarelto with PPM, stage III CKD, and BPH.  S: Today again states he is much better and denies nausea/emesis, chest pain. He does state that his abdomen feels as if it has gone down today. He states that yesterday it did feel more distended and during that time he also had some shortness of breath while lying down. Denies SOB at this time  BP 149/69 mmHg  Pulse 70  Temp(Src) 97.3 F (36.3 C) (Oral)  Resp 18  Ht 5\' 8"  (1.727 m)  Wt 193 lb 9 oz (87.8 kg)  BMI 29.44 kg/m2  SpO2 99%  Exam Gen: NAD, sitting in bed side chair CV: RRR, no murmurs Pulm: CTAB Abd: mildly distended, less tense than yesterday non tender + BS Extemities: LE 1-2+ pitting edema, non tender  A/P    Illeus- Evidence of progressing partial SBO on KUB, s/p NGT placement - surgery consulted - follow NGT out put - NPOt  - D5 NS at 100cc/hr   BPH- stable with good UOP - hold rapaflo while inpatient  CKD- Stable,  - follow as needed - follow I/Os  H/o afib/paced - hold home Xarelto, heparin  -per cards can restart home dronedarone when take able   Hyponatremia- Improved - But as NPO again with NGT  in place will again follow daily BMPs - continue to trend as needed  Weakness-  - PT/OT- SNF - SW following as well and SNF ([facility])  Dispo: When clinically improved   Telicia Hodgkiss A. Lincoln Brigham MD, [place] [facility] Resident PGY-2 Pager [phone]

## 2015-08-24 NOTE — Progress Notes (Deleted)
Patient c/o to this nurse that she cannot sleep despite pain medications given but does not want anymore medication.  Patient states, "My head is just active" and wants to call her daughter around 77 and very concern about the planned surgery in the afternoon.  Encouraged patient to close eyes and clear out her mind for now.  Assured patient that this nurse will call her daughter around 75.

## 2015-08-25 ENCOUNTER — Inpatient Hospital Stay (HOSPITAL_COMMUNITY): Payer: Medicare Other

## 2015-08-25 LAB — CBC
HEMATOCRIT: 34.9 % — AB (ref 39.0–52.0)
HEMOGLOBIN: 11.9 g/dL — AB (ref 13.0–17.0)
MCH: 30.8 pg (ref 26.0–34.0)
MCHC: 34.1 g/dL (ref 30.0–36.0)
MCV: 90.4 fL (ref 78.0–100.0)
Platelets: 153 10*3/uL (ref 150–400)
RBC: 3.86 MIL/uL — ABNORMAL LOW (ref 4.22–5.81)
RDW: 14 % (ref 11.5–15.5)
WBC: 8.2 10*3/uL (ref 4.0–10.5)

## 2015-08-25 LAB — APTT: APTT: 85 s — AB (ref 24–37)

## 2015-08-25 LAB — BASIC METABOLIC PANEL
ANION GAP: 7 (ref 5–15)
BUN: 10 mg/dL (ref 6–20)
CALCIUM: 8 mg/dL — AB (ref 8.9–10.3)
CHLORIDE: 106 mmol/L (ref 101–111)
CO2: 26 mmol/L (ref 22–32)
Creatinine, Ser: 1.16 mg/dL (ref 0.61–1.24)
GFR calc Af Amer: >60 mL/min (ref >60)
GFR calc non Af Amer: 52 mL/min — ABNORMAL LOW (ref >60)
GLUCOSE: 125 mg/dL — AB (ref 65–99)
Potassium: 3.2 mmol/L — ABNORMAL LOW (ref 3.5–5.1)
Sodium: 139 mmol/L (ref 135–145)

## 2015-08-25 LAB — MAGNESIUM: Magnesium: 1.6 mg/dL — ABNORMAL LOW (ref 1.7–2.4)

## 2015-08-25 LAB — HEPARIN LEVEL (UNFRACTIONATED): Heparin Unfractionated: 0.36 IU/mL (ref 0.30–0.70)

## 2015-08-25 MED ORDER — MAGNESIUM SULFATE 2 GM/50ML IV SOLN
2.0000 g | Freq: Once | INTRAVENOUS | Status: AC
  Administered 2015-08-25: 2 g via INTRAVENOUS
  Filled 2015-08-25: qty 50

## 2015-08-25 MED ORDER — KCL IN DEXTROSE-NACL 20-5-0.9 MEQ/L-%-% IV SOLN
INTRAVENOUS | Status: DC
  Administered 2015-08-25 (×2): via INTRAVENOUS
  Filled 2015-08-25 (×3): qty 1000

## 2015-08-25 NOTE — Progress Notes (Signed)
See Dr. Victorino December recommendations from yesterday. Restart po medications when able to take po. Operative risk, if necessary, has been estimated. Cardiology will be available as needed over the weekend.  Pixie Casino, MD, [facility] Attending Cardiologist [place]

## 2015-08-25 NOTE — Progress Notes (Addendum)
. Family Medicine Teaching Service Daily Progress Note Intern Pager: [phone]  Patient name: [name] Medical record number: [id number] Date of birth: 07-31-1920 Age: 81 y.o. Gender: male  Primary Care Provider: [name], MD Consultants: None Code Status: DNR  Pt Overview and Major Events to Date:  10/1: CT Abdomen Pelvis 10/3: DG Abd Acute w/Chest 10/6: DG Abd 1 View 10/7: NG tube placed 10/8: xray shows resolution of SBO  Assessment and Plan: [name] is a 81 y.o. male presenting with nausea and vomiting with radiographic evidence of ileus, now clinically resolved. PMH is significant for history of ilues, appendectomy, esophageal stricture, diverticulosis, DNR, AFib on xarelto with PPM, stage III CKD, and BPH.  Partial SBO: Air-fluid levels in small bowel on CT scan. Now with daily BMs, passing flatus. Pain, N/V resolved. Partial progressive SBO on KUB with continued distention, now with NG tube, resolved on today's xray - NG tube decompression, clamping per surgery today. Anticipate pulling NG and starting clears tomorrow - NPO, with ice up to 4oz q2h per surgery - MIVF - holding Xarelto, continue heparin until able to resume PO meds - PRNs, none needed during stay: Zofran 4mg  IV q8h prn nausea, morphine 1mg  IV prn abdominal pain - All meds IV  BPH, controlled: Restarted Rapaflo with decreased retention but continued severe nocturia symptoms. Previously failed Flomax. Discontinue elavil, though he denies taking this. No hydronephrosis on CT, bladder scan <327mL - Strict I/O, normal UOP per patient and RN but unmeasured voids charted - Hold Rapaflo while NPO  Stage III CKD: creat at/below historical baseline - MIVF  Paroxysmal atrial fibrillation/HTN: s/p cardioversion, now with PPM in situ, on dronedarone and bystolic. Followed by Dr. [name]. Troponin neg x3. ECG shows dual paced rhythm so offers little insight into cardiac ischemia. Last echo was 2006 with  preserved EF and currently euvolemic.  - Hold home nebivolol and dronedarone while NPO, restart both when PO per cards - Metoprolol IV while NPO - Holding Xarelto, continue heparin until able to take po  Esophageal stricture/dysmotility: Not thought to be contributing to presentation. Followed by Dr. Hilarie Fredrickson, last EGD with dilation Nov 2015 otherwise noted to be normal. - No intervention planned.  FEN/GI: NPO, MIVF, ice chips Prophylaxis: Heparin per pharmacy for therapeutic anticoagulation until we can transition back to Xarelto.  Disposition: SNF, pt requested [facility]  Subjective:  No acute events overnight. Patient reports that he continues to feel better this morning, passing flatus, denies nausea, no emesis since admission.  Objective: Temp:  [97.6 F (36.4 C)-98.1 F (36.7 C)] 97.6 F (36.4 C) (10/08 0414) Pulse Rate:  [69-70] 70 (10/08 0414) Resp:  [18] 18 (10/08 0414) BP: (150-168)/(68-72) 166/72 mmHg (10/08 0414) SpO2:  [98 %-100 %] 98 % (10/08 0414) Weight:  [197 lb 3.2 oz (89.449 kg)] 197 lb 3.2 oz (89.449 kg) (10/08 0604) Physical Exam: General: Alert, comfortably sitting up in chair, NG tube in place Cardiovascular: RRR Respiratory: CTAB Abdomen: frequent bowel sounds, soft, distended but obese abdomen, mildly tender to deep palpation in RUQ but nontender elsewhere.  Laboratory:  Recent Labs Lab 08/22/15 1320 08/24/15 0234 08/25/15 0403  WBC 7.0 7.9 8.2  HGB 12.7* 11.6* 11.9*  HCT 37.0* 33.4* 34.9*  PLT 168 146* 153    Recent Labs Lab 08/18/15 1026 08/20/15 0525  08/22/15 1320 08/23/15 0514 08/25/15 0403  NA 136 127*  < > 130* 136 139  K 5.2* 3.5  < > 3.3* 3.5 3.2*  CL 102 92*  < > 98* 104 106  CO2 26 24  < > 25 24 26   BUN 19 24*  < > 8 10 10   CREATININE 1.48* 1.37*  < > 1.07 1.18 1.16  CALCIUM 9.2 8.7*  < > 7.8* 8.2* 8.0*  PROT 6.4* 6.2*  --   --   --   --   BILITOT 0.9 1.0  --   --   --   --   ALKPHOS 46 39  --   --   --   --    ALT 15* 17  --   --   --   --   AST 19 33  --   --   --   --   GLUCOSE 154* 136*  < > 115* 99 125*  < > = values in this interval not displayed.  Imaging/Diagnostic Tests: 10/8 DG Abd: 1. Nasogastric tube placement to the gastric antrum with resolution of small bowel distension.  10/6 DG Abd 1 View Increasing small bowel distention when compared with the prior exam consistent with a progressive partial small bowel obstruction. No free air is seen.  10/3 DG ABDOMEN ACUTE W/ 1V CHEST: Small bowel distention with air-fluid levels. Moderate amount of stool and air in the large bowel; findings suggest ileus.  10/1 CT Abd Pelvis wo contrast: Gastric and proximal small bowel distention with air and contrast, nonspecific. No definite obstruction pattern. Negative for free air.   Beverlyn Roux, MD, MPH [facility] PGY-3 08/25/2015 8:56 AM

## 2015-08-25 NOTE — Progress Notes (Signed)
Subjective: He has a few Bs, and reports some stool, they just emptied his cannister.  His sump was full of liquid.  He says he feels a little better.  Objective: Vital signs in last 24 hours: Temp:  [97.6 F (36.4 C)-98.1 F (36.7 C)] 97.6 F (36.4 C) (10/08 0414) Pulse Rate:  [69-70] 70 (10/08 0414) Resp:  [18] 18 (10/08 0414) BP: (150-168)/(68-72) 166/72 mmHg (10/08 0414) SpO2:  [98 %-100 %] 98 % (10/08 0414) Weight:  [89.449 kg (197 lb 3.2 oz)] 89.449 kg (197 lb 3.2 oz) (10/08 0604) Last BM Date: 08/23/15 750 from the NG Only 175 urine recorded Afebrile, VSS K+ 3.2 creatinine is stable, Mag is 1.6  Intake/Output from previous day: 10/07 0701 - 10/08 0700 In: 2486.4 [I.V.:2456.4; NG/GT:30] Out: 925 [Urine:175; Emesis/NG output:750] Intake/Output this shift: Total I/O In: 0  Out: 150 [Emesis/NG output:150]  General appearance: alert, cooperative and no distress Resp: clear to auscultation bilaterally GI: large abdomen up in chair, but less distended than when we saw him Thurs PM.  BS hypoactive.  Lab Results:   Recent Labs  08/24/15 0234 08/25/15 0403  WBC 7.9 8.2  HGB 11.6* 11.9*  HCT 33.4* 34.9*  PLT 146* 153    BMET  Recent Labs  08/23/15 0514 08/25/15 0403  NA 136 139  K 3.5 3.2*  CL 104 106  CO2 24 26  GLUCOSE 99 125*  BUN 10 10  CREATININE 1.18 1.16  CALCIUM 8.2* 8.0*   PT/INR No results for input(s): LABPROT, INR in the last 72 hours.   Recent Labs Lab 08/18/15 1026 08/20/15 0525  AST 19 33  ALT 15* 17  ALKPHOS 46 39  BILITOT 0.9 1.0  PROT 6.4* 6.2*  ALBUMIN 3.8 3.6     Lipase     Component Value Date/Time   LIPASE 11* 08/20/2015 0525     Studies/Results: Dg Abd 1 View  08/25/2015   CLINICAL DATA:  Small bowel obstruction. Pt does have a NG tube.  EXAM: ABDOMEN - 1 VIEW  COMPARISON:  08/23/2015  FINDINGS: Nasogastric tube has been placed at least as far as the gastric antrum. Small bowel is decompressed since previous  exam. Normal distribution of gas and stool throughout the colon. Spondylitic changes in the lumbar spine.  Left hip arthroplasty hardware partially visualized.  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  IMPRESSION: 1. Nasogastric tube placement to the gastric antrum with resolution of small bowel distension.   Electronically Signed   By: Lucrezia Europe M.D.   On: 08/25/2015 09:21   Dg Abd 1 View  08/24/2015   CLINICAL DATA:  Nasogastric tube placement under fluoroscopy.  EXAM: ABDOMEN - 1 VIEW  COMPARISON:  08/23/2015  FINDINGS: Nasogastric tube passes below the diaphragm to have its tip in the mid to distal stomach, well positioned.  IMPRESSION: Well-positioned nasogastric tube.   Electronically Signed   By: Lajean Manes M.D.   On: 08/24/2015 08:54   Dg Abd 1 View  08/23/2015   CLINICAL DATA:  Abdominal pain and distension  EXAM: ABDOMEN - 1 VIEW  COMPARISON:  08/20/2015  FINDINGS: Scattered large and small bowel gas is noted. Persistent and slightly increased small bowel dilatation is noted. The stomach is distended with air. No abnormal mass or abnormal calcifications are noted. Postsurgical changes in the left hip are noted. No free air is noted.  IMPRESSION: Increasing small bowel distention when compared with the prior exam consistent with a progressive partial small  bowel obstruction. No free air is seen.   Electronically Signed   By: Inez Catalina M.D.   On: 08/23/2015 14:56   Dg Addison Bailey G Tube Plc W/fl-no Rad  08/24/2015   CLINICAL DATA:    NASO G TUBE PLACEMENT WITH FLUORO  Fluoroscopy was utilized by the requesting physician.  No radiographic  interpretation.     Medications: . metoprolol  2.5 mg Intravenous Q12H  . pantoprazole (PROTONIX) IV  40 mg Intravenous Q12H  . sodium chloride  3 mL Intravenous Q12H   . dextrose 5 % and 0.9% NaCl 100 mL/hr at 08/25/15 0000  . heparin 1,000 Units/hr (08/24/15 1434)    Assessment/Plan Ileus/SBO IR had to place NG Hx of prior SBO, open  appendectomy, possible laparoscopy with SBO in the past, but I cannot find records of it. Atrial fibrillation on Xarelto BPH Chronic kidney disease Hypertension Hx of esophageal stricture/dysmotility Resolved hyponatremia Mild hypokalemia/hypomagnesemia Antibiotics: None DVT: Heparin drip/SCD    Plan:  I got his NG working.   I will try some clamping trails.  Replace K+ and Mag.  If he does well clamp and may start clears tomorrow.  Recheck labs in AM,  Film better.  LOS: 5 days    Jatziri Goffredo 08/25/2015 \

## 2015-08-25 NOTE — Progress Notes (Signed)
[place] for Heparin Indication: Afib  Allergies  Allergen Reactions  . Codeine     REACTION: nausea    Patient Measurements: TBW 88.5 kg IBW 68.4 kg Heparin dosing weight: 86.4 kg  Vital Signs: Temp: 97.6 F (36.4 C) (10/08 0414) Temp Source: Oral (10/08 0414) BP: 166/72 mmHg (10/08 0414) Pulse Rate: 70 (10/08 0414)  Labs:  Recent Labs  08/22/15 1320 08/23/15 0514 08/24/15 0234 08/24/15 1056 08/25/15 0403  HGB 12.7*  --  11.6*  --  11.9*  HCT 37.0*  --  33.4*  --  34.9*  PLT 168  --  146*  --  153  APTT  --   --  94* 101* 85*  HEPARINUNFRC  --   --   --   --  0.36  CREATININE 1.07 1.18  --   --  1.16    Estimated Creatinine Clearance: 42.9 mL/min (by C-G formula based on Cr of 1.16).   Assessment: 81yo male with history of ileus, CKD3, Afib on xarelto and esophageal stricture  presents on 10/3 with emesis and nausea. Pharmacy is consulted to dose heparin for atrial fibrillation while patient is NPO for a 2nd time on 10/6 PM. Last xarelto dose 10/5 at 1649. Heparin level and aPTT are both therapeutic. No bleeding noted.   Goal of Therapy:  APTT 66-102 seconds Heparin level 0.3-0.7 units/ml Monitor platelets by anticoagulation protocol: Yes   Plan:  - Continue heparin gtt at 1000 units/hr - Daily heparin level and CBC  Salome Arnt, PharmD, BCPS Pager # [phone] 08/25/2015 5:20 AM

## 2015-08-26 DIAGNOSIS — L8992 Pressure ulcer of unspecified site, stage 2: Secondary | ICD-10-CM | POA: Insufficient documentation

## 2015-08-26 DIAGNOSIS — L8990 Pressure ulcer of unspecified site, unspecified stage: Secondary | ICD-10-CM

## 2015-08-26 LAB — MAGNESIUM: Magnesium: 1.8 mg/dL (ref 1.7–2.4)

## 2015-08-26 LAB — BASIC METABOLIC PANEL
Anion gap: 8 (ref 5–15)
BUN: 7 mg/dL (ref 6–20)
CALCIUM: 8 mg/dL — AB (ref 8.9–10.3)
CHLORIDE: 99 mmol/L — AB (ref 101–111)
CO2: 26 mmol/L (ref 22–32)
CREATININE: 1.12 mg/dL (ref 0.61–1.24)
GFR calc Af Amer: >60 mL/min (ref >60)
GFR calc non Af Amer: 55 mL/min — ABNORMAL LOW (ref >60)
GLUCOSE: 112 mg/dL — AB (ref 65–99)
Potassium: 3.2 mmol/L — ABNORMAL LOW (ref 3.5–5.1)
Sodium: 133 mmol/L — ABNORMAL LOW (ref 135–145)

## 2015-08-26 LAB — CBC
HEMATOCRIT: 37.1 % — AB (ref 39.0–52.0)
Hemoglobin: 12.5 g/dL — ABNORMAL LOW (ref 13.0–17.0)
MCH: 30.4 pg (ref 26.0–34.0)
MCHC: 33.7 g/dL (ref 30.0–36.0)
MCV: 90.3 fL (ref 78.0–100.0)
PLATELETS: 166 10*3/uL (ref 150–400)
RBC: 4.11 MIL/uL — ABNORMAL LOW (ref 4.22–5.81)
RDW: 13.9 % (ref 11.5–15.5)
WBC: 8.3 10*3/uL (ref 4.0–10.5)

## 2015-08-26 LAB — HEPARIN LEVEL (UNFRACTIONATED)
HEPARIN UNFRACTIONATED: 0.27 [IU]/mL — AB (ref 0.30–0.70)
Heparin Unfractionated: 0.29 IU/mL — ABNORMAL LOW (ref 0.30–0.70)

## 2015-08-26 MED ORDER — KCL IN DEXTROSE-NACL 40-5-0.9 MEQ/L-%-% IV SOLN
INTRAVENOUS | Status: DC
  Administered 2015-08-26 – 2015-08-28 (×4): via INTRAVENOUS
  Filled 2015-08-26 (×7): qty 1000

## 2015-08-26 MED ORDER — POTASSIUM CHLORIDE 20 MEQ/15ML (10%) PO SOLN
20.0000 meq | Freq: Once | ORAL | Status: AC
  Administered 2015-08-26: 20 meq via ORAL
  Filled 2015-08-26: qty 15

## 2015-08-26 NOTE — Progress Notes (Signed)
. Family Medicine Teaching Service Daily Progress Note Intern Pager: [phone]  Patient name: [name] Medical record number: [id number] Date of birth: [birth year] Age: 81 y.o. Gender: male  Primary Care Provider: [name], MD Consultants: None Code Status: DNR  Pt Overview and Major Events to Date:  10/1: CT Abdomen Pelvis 10/3: DG Abd Acute w/Chest 10/6: DG Abd 1 View 10/7: NG tube placed 10/8: xray shows resolution of SBO  Assessment and Plan: [name] is a 81 y.o. male presenting with nausea and vomiting with radiographic evidence of ileus, now clinically resolved. PMH is significant for history of ilues, appendectomy, esophageal stricture, diverticulosis, DNR, AFib on xarelto with PPM, stage III CKD, and BPH.  Partial SBO: Air-fluid levels in small bowel on CT scan. Now with daily BMs, passing flatus. Pain, N/V resolved. Partial SBO resolved on KUB - NG tube decompression, clamped 10/8. Await surgery recs on pulling tube and starting clears - NPO, with ice up to 4oz q2h per surgery - MIVF (currently at 23mL/hr due to overload yesterday, will need to be increased if not starting PO today) - holding Xarelto, continue heparin until able to resume PO meds - PRNs, none needed during stay: Zofran 4mg  IV q8h prn nausea, morphine 1mg  IV prn abdominal pain - All meds IV  BPH, controlled:  - Strict I/O, normal UOP per patient and RN but unmeasured voids charted - Hold Rapaflo while NPO  Stage III CKD: creat at/below historical baseline - MIVF  Paroxysmal atrial fibrillation/HTN: s/p cardioversion, now with PPM in situ, on dronedarone and bystolic. Followed by Dr. [name]. Troponin neg x3. ECG shows dual paced rhythm so offers little insight into cardiac ischemia. Last echo was 2006 with preserved EF and currently euvolemic.  - Hold home nebivolol and dronedarone while NPO, restart both when PO per cards - Metoprolol IV while NPO - Holding Xarelto, continue heparin until  able to take po  Esophageal stricture/dysmotility: Not thought to be contributing to presentation. Followed by Dr. Hilarie Fredrickson, last EGD with dilation Nov 2015 otherwise noted to be normal. - No intervention planned.  FEN/GI: NPO, MIVF, ice chips Prophylaxis: Heparin per pharmacy for therapeutic anticoagulation until we can transition back to Xarelto.  Disposition: SNF, pt requested [facility]  Subjective:  No acute events overnight. Patient reports that he continues to feel better this morning, passing flatus, denies nausea, no emesis.  Objective: Temp:  [97.6 F (36.4 C)-98.4 F (36.9 C)] 97.6 F (36.4 C) (10/09 0531) Pulse Rate:  [66-77] 77 (10/09 0531) Resp:  [17-18] 18 (10/09 0531) BP: (174-179)/(72-76) 179/76 mmHg (10/09 0531) SpO2:  [98 %-100 %] 100 % (10/09 0531) Weight:  [198 lb 3.2 oz (89.903 kg)] 198 lb 3.2 oz (89.903 kg) (10/09 0531) Physical Exam: General: Alert, comfortably sitting up in chair, NG tube in place Cardiovascular: RRR Respiratory: CTAB, no crackles Abdomen: frequent bowel sounds, soft, distended but obese abdomen, nontender. Extremities: no LE edema  Laboratory:  Recent Labs Lab 08/22/15 1320 08/24/15 0234 08/25/15 0403  WBC 7.0 7.9 8.2  HGB 12.7* 11.6* 11.9*  HCT 37.0* 33.4* 34.9*  PLT 168 146* 153    Recent Labs Lab 08/20/15 0525  08/22/15 1320 08/23/15 0514 08/25/15 0403  NA 127*  < > 130* 136 139  K 3.5  < > 3.3* 3.5 3.2*  CL 92*  < > 98* 104 106  CO2 24  < > 25 24 26   BUN 24*  < > 8 10 10   CREATININE 1.37*  < >  1.07 1.18 1.16  CALCIUM 8.7*  < > 7.8* 8.2* 8.0*  PROT 6.2*  --   --   --   --   BILITOT 1.0  --   --   --   --   ALKPHOS 39  --   --   --   --   ALT 17  --   --   --   --   AST 33  --   --   --   --   GLUCOSE 136*  < > 115* 99 125*  < > = values in this interval not displayed.  Imaging/Diagnostic Tests: 10/8 DG Abd: 1. Nasogastric tube placement to the gastric antrum with resolution of small bowel  distension.  10/6 DG Abd 1 View Increasing small bowel distention when compared with the prior exam consistent with a progressive partial small bowel obstruction. No free air is seen.  10/3 DG ABDOMEN ACUTE W/ 1V CHEST: Small bowel distention with air-fluid levels. Moderate amount of stool and air in the large bowel; findings suggest ileus.  10/1 CT Abd Pelvis wo contrast: Gastric and proximal small bowel distention with air and contrast, nonspecific. No definite obstruction pattern. Negative for free air.   Beverlyn Roux, MD, MPH [facility] PGY-3 08/26/2015 8:06 AM

## 2015-08-26 NOTE — Progress Notes (Signed)
Subjective: He is clamped and feels better.  I am going to leave him clamped and give him some sips, put some K+ down thru his NG and add some additional K+ to his IV.  See how he does.  He walked in halls twice yesterday.  Objective: Vital signs in last 24 hours: Temp:  [97.6 F (36.4 C)-98.4 F (36.9 C)] 97.6 F (36.4 C) (10/09 0531) Pulse Rate:  [66-77] 77 (10/09 0531) Resp:  [17-18] 18 (10/09 0531) BP: (174-179)/(72-76) 179/76 mmHg (10/09 0531) SpO2:  [98 %-100 %] 100 % (10/09 0531) Weight:  [89.903 kg (198 lb 3.2 oz)] 89.903 kg (198 lb 3.2 oz) (10/09 0531) Last BM Date: 08/23/15 500 from NG yesterday Afebrile, VSS K+ 3.2, mag 1.8 Cbc OK Intake/Output from previous day: 10/08 0701 - 10/09 0700 In: 1193.3 [I.V.:1163.3; NG/GT:30] Out: 1300 [Urine:800; Emesis/NG output:500] Intake/Output this shift: Total I/O In: -  Out: 400 [Urine:200; Emesis/NG output:200]  General appearance: alert, cooperative and no distress GI: soft, few BS, non tender, passing flatus  Lab Results:   Recent Labs  08/25/15 0403 08/26/15 0811  WBC 8.2 8.3  HGB 11.9* 12.5*  HCT 34.9* 37.1*  PLT 153 166    BMET  Recent Labs  08/25/15 0403 08/26/15 0811  NA 139 133*  K 3.2* 3.2*  CL 106 99*  CO2 26 26  GLUCOSE 125* 112*  BUN 10 7  CREATININE 1.16 1.12  CALCIUM 8.0* 8.0*   PT/INR No results for input(s): LABPROT, INR in the last 72 hours.   Recent Labs Lab 08/20/15 0525  AST 33  ALT 17  ALKPHOS 39  BILITOT 1.0  PROT 6.2*  ALBUMIN 3.6     Lipase     Component Value Date/Time   LIPASE 11* 08/20/2015 0525     Studies/Results: Dg Abd 1 View  08/25/2015   CLINICAL DATA:  Small bowel obstruction. Pt does have a NG tube.  EXAM: ABDOMEN - 1 VIEW  COMPARISON:  08/23/2015  FINDINGS: Nasogastric tube has been placed at least as far as the gastric antrum. Small bowel is decompressed since previous exam. Normal distribution of gas and stool throughout the colon. Spondylitic  changes in the lumbar spine.  Left hip arthroplasty hardware partially visualized.  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  IMPRESSION: 1. Nasogastric tube placement to the gastric antrum with resolution of small bowel distension.   Electronically Signed   By: Lucrezia Europe M.D.   On: 08/25/2015 09:21    Medications: . metoprolol  2.5 mg Intravenous Q12H  . pantoprazole (PROTONIX) IV  40 mg Intravenous Q12H  . sodium chloride  3 mL Intravenous Q12H   . dextrose 5 % and 0.9 % NaCl with KCl 20 mEq/L 50 mL/hr at 08/25/15 2127  . heparin 1,000 Units/hr (08/25/15 1459)   Assessment/Plan Ileus/SBO IR had to place NG Hx of prior SBO, open appendectomy, possible laparoscopy with SBO in the past, but I cannot find records of it. Atrial fibrillation on Xarelto BPH Chronic kidney disease Hypertension Hx of esophageal stricture/dysmotility Resolved hyponatremia Mild hypokalemia hypomagnesemia  - resolved Antibiotics: None DVT: Heparin drip/SCD   Plan:  I am going to put some additional K+ into the NG, and then clamp it again, give him some sips of clears and see how he does.   IR had to place the NG so I am going to leave it in till we are sure he is better     LOS: 6 days  [name] 08/26/2015

## 2015-08-26 NOTE — Progress Notes (Signed)
[name] for Heparin Indication: Afib  Allergies  Allergen Reactions  . Codeine     REACTION: nausea    Patient Measurements: TBW 88.5 kg IBW 68.4 kg Heparin dosing weight: 86.4 kg  Vital Signs: Temp: 97.6 F (36.4 C) (10/09 0531) Temp Source: Axillary (10/09 0531) BP: 179/76 mmHg (10/09 0531) Pulse Rate: 77 (10/09 0531)  Labs:  Recent Labs  08/24/15 0234 08/24/15 1056 08/25/15 0403 08/26/15 0811  HGB 11.6*  --  11.9* 12.5*  HCT 33.4*  --  34.9* 37.1*  PLT 146*  --  153 166  APTT 94* 101* 85*  --   HEPARINUNFRC  --   --  0.36  --   CREATININE  --   --  1.16  --     Estimated Creatinine Clearance: 43.3 mL/min (by C-G formula based on Cr of 1.16).   Assessment: 81yo male with history of ileus, CKD3, Afib on xarelto and esophageal stricture  presents on 10/3 with emesis and nausea. Pharmacy is consulted to dose heparin for atrial fibrillation while patient is NPO for a 2nd time on 10/6 PM. Last xarelto dose 10/5 at 1649. HL slightly subtherapeutic this am at 0.29. CBC stable but low with Hgb of 12.5, plts 166. No s/s of bleed.  Goal of Therapy:  Heparin level 0.3-0.7 units/ml Monitor platelets by anticoagulation protocol: Yes   Plan:  Increase heparin drip to 1100 units/hr Monitor daily HL, CBC, s/s of bleed Check 8 hr HL F/u restart of xarelto after a few days of NG decompression for ileus/SBO.   Elenor Quinones, PharmD Clinical Pharmacist Pager [phone] 08/26/2015 8:58 AM

## 2015-08-26 NOTE — Progress Notes (Signed)
[name] for Heparin Indication: Afib  Allergies  Allergen Reactions  . Codeine     REACTION: nausea    Patient Measurements: TBW 88.5 kg IBW 68.4 kg Heparin dosing weight: 86.4 kg  Vital Signs: Temp: 97.6 F (36.4 C) (10/09 1357) Temp Source: Oral (10/09 1357) BP: 172/71 mmHg (10/09 1357) Pulse Rate: 71 (10/09 1357)  Labs:  Recent Labs  08/24/15 0234 08/24/15 1056 08/25/15 0403 08/26/15 0811 08/26/15 1911  HGB 11.6*  --  11.9* 12.5*  --   HCT 33.4*  --  34.9* 37.1*  --   PLT 146*  --  153 166  --   APTT 94* 101* 85*  --   --   HEPARINUNFRC  --   --  0.36 0.29* 0.27*  CREATININE  --   --  1.16 1.12  --     Estimated Creatinine Clearance: 44.9 mL/min (by C-G formula based on Cr of 1.12).   Assessment: 81yo male with history of ileus, CKD3, Afib on xarelto and esophageal stricture  presents on 10/3 with emesis and nausea. Pharmacy is consulted to dose heparin for atrial fibrillation while patient is NPO.  Heparin level is SUBtherpaeutic at 0.27 on 1100 units/hr. No bleeding noted. No problems per RN.  Goal of Therapy:  Heparin level 0.3-0.7 units/ml Monitor platelets by anticoagulation protocol: Yes   Plan:  Increase heparin drip to 1250 units/hr Monitor daily heparin level, CBC, s/s of bleed F/u restart of Xarelto after a few days of NG decompression for ileus/SBO  [facility], [place].D., BCPS Clinical Pharmacist Pager: [phone] 08/26/2015 8:45 PM

## 2015-08-27 ENCOUNTER — Inpatient Hospital Stay (HOSPITAL_COMMUNITY): Payer: Medicare Other

## 2015-08-27 DIAGNOSIS — I10 Essential (primary) hypertension: Secondary | ICD-10-CM | POA: Insufficient documentation

## 2015-08-27 LAB — BASIC METABOLIC PANEL
Anion gap: 8 (ref 5–15)
BUN: 7 mg/dL (ref 6–20)
CALCIUM: 8.3 mg/dL — AB (ref 8.9–10.3)
CO2: 27 mmol/L (ref 22–32)
Chloride: 102 mmol/L (ref 101–111)
Creatinine, Ser: 1.21 mg/dL (ref 0.61–1.24)
GFR calc Af Amer: 58 mL/min — ABNORMAL LOW (ref >60)
GFR, EST NON AFRICAN AMERICAN: 50 mL/min — AB (ref >60)
GLUCOSE: 115 mg/dL — AB (ref 65–99)
Potassium: 3.2 mmol/L — ABNORMAL LOW (ref 3.5–5.1)
SODIUM: 137 mmol/L (ref 135–145)

## 2015-08-27 LAB — HEPARIN LEVEL (UNFRACTIONATED)
HEPARIN UNFRACTIONATED: 0.55 [IU]/mL (ref 0.30–0.70)
Heparin Unfractionated: 0.51 IU/mL (ref 0.30–0.70)

## 2015-08-27 LAB — CBC
HCT: 35.5 % — ABNORMAL LOW (ref 39.0–52.0)
Hemoglobin: 11.9 g/dL — ABNORMAL LOW (ref 13.0–17.0)
MCH: 30.3 pg (ref 26.0–34.0)
MCHC: 33.5 g/dL (ref 30.0–36.0)
MCV: 90.3 fL (ref 78.0–100.0)
PLATELETS: 159 10*3/uL (ref 150–400)
RBC: 3.93 MIL/uL — ABNORMAL LOW (ref 4.22–5.81)
RDW: 14 % (ref 11.5–15.5)
WBC: 8.8 10*3/uL (ref 4.0–10.5)

## 2015-08-27 MED ORDER — POTASSIUM CHLORIDE 20 MEQ/15ML (10%) PO SOLN
40.0000 meq | Freq: Once | ORAL | Status: AC
  Administered 2015-08-27: 40 meq via ORAL
  Filled 2015-08-27: qty 30

## 2015-08-27 MED ORDER — RAMIPRIL 2.5 MG PO CAPS
2.5000 mg | ORAL_CAPSULE | Freq: Every morning | ORAL | Status: DC
  Administered 2015-08-27 – 2015-08-30 (×4): 2.5 mg via ORAL
  Filled 2015-08-27 (×5): qty 1

## 2015-08-27 MED ORDER — SILODOSIN 4 MG PO CAPS
4.0000 mg | ORAL_CAPSULE | Freq: Every day | ORAL | Status: DC
  Administered 2015-08-28 – 2015-08-30 (×3): 4 mg via ORAL
  Filled 2015-08-27 (×4): qty 1

## 2015-08-27 MED ORDER — DRONEDARONE HCL 400 MG PO TABS
400.0000 mg | ORAL_TABLET | Freq: Two times a day (BID) | ORAL | Status: DC
  Administered 2015-08-27 – 2015-08-30 (×6): 400 mg via ORAL
  Filled 2015-08-27 (×8): qty 1

## 2015-08-27 MED ORDER — ACETAMINOPHEN 325 MG PO TABS: 650.0000 mg | ORAL_TABLET | Freq: Four times a day (QID) | ORAL | Status: DC | PRN

## 2015-08-27 MED ORDER — MUSCLE RUB 10-15 % EX CREA
TOPICAL_CREAM | Freq: Two times a day (BID) | CUTANEOUS | Status: DC | PRN
  Administered 2015-08-27: 02:00:00 via TOPICAL
  Filled 2015-08-27: qty 85

## 2015-08-27 MED ORDER — NON FORMULARY: 4.0000 mg | Freq: Every day | Status: DC

## 2015-08-27 MED ORDER — NEBIVOLOL HCL 10 MG PO TABS
10.0000 mg | ORAL_TABLET | ORAL | Status: DC
  Administered 2015-08-27 – 2015-08-30 (×4): 10 mg via ORAL
  Filled 2015-08-27 (×5): qty 1

## 2015-08-27 MED ORDER — GUAIFENESIN ER 600 MG PO TB12
600.0000 mg | ORAL_TABLET | Freq: Two times a day (BID) | ORAL | Status: DC
  Administered 2015-08-27 (×2): 600 mg via ORAL
  Filled 2015-08-27 (×2): qty 1

## 2015-08-27 NOTE — Care Management Important Message (Signed)
Important Message  Patient Details  Name: [name] MRN: [id number] Date of Birth: June 13, 1920   Medicare Important Message Given:  Yes-third notification given    Delorse Lek 08/27/2015, 2:32 PM

## 2015-08-27 NOTE — Progress Notes (Signed)
Physical Therapy Treatment Patient Details Name: LESLY JOSLYN MRN: [id number] DOB: 1920/10/08 Today's Date: 08/27/2015    History of Present Illness [name] is a 81 y.o. male presenting with nausea and vomiting with radiographic evidence of ileus. PMH is significant for history of ilues, appendectomy, esophageal stricture, diverticulosis, DNR, AFib on xarelto with PPM, stage III CKD, and BPH.    PT Comments    Patient progressing well towards PT goals. Improved ambulation distance however exhibit bil knee instability Lft>Rt but no buckling. Fatigues easily. Continues to require assist standing from surfaces due to weakness. Appropriate for ST SNF. Will continue to follow acutely per current POC. Encouraged ambulation with Rn daily.   Follow Up Recommendations  SNF     Equipment Recommendations  None recommended by PT    Recommendations for Other Services       Precautions / Restrictions Precautions Precautions: Fall Precaution Comments: NG tube Restrictions Weight Bearing Restrictions: No    Mobility  Bed Mobility               General bed mobility comments: sitting up in recliner upon arrival  Transfers Overall transfer level: Needs assistance Equipment used: Rolling walker (2 wheeled) Transfers: Sit to/from Stand Sit to Stand: Min assist         General transfer comment: Stood from chair x1, from elevated toilet x1 using grab bar to assist. Min A to boost up.  Ambulation/Gait Ambulation/Gait assistance: Min guard Ambulation Distance (Feet): 150 Feet Assistive device: Rolling walker (2 wheeled) Gait Pattern/deviations: Step-through pattern;Decreased stride length;Decreased stance time - right;Trunk flexed Gait velocity: 1.0 ft/sec   General Gait Details: Slow, steady gait. Knee instability noted but no buckling.    Stairs            Wheelchair Mobility    Modified Rankin (Stroke Patients Only)       Balance Overall balance  assessment: Needs assistance Sitting-balance support: Feet supported;No upper extremity supported Sitting balance-Leahy Scale: Good     Standing balance support: During functional activity Standing balance-Leahy Scale: Poor Standing balance comment: Relient on RW for support. Able to perform hand washing at sink without UE support for short period.                    Cognition Arousal/Alertness: Awake/alert Behavior During Therapy: WFL for tasks assessed/performed Overall Cognitive Status: Within Functional Limits for tasks assessed                      Exercises General Exercises - Lower Extremity Ankle Circles/Pumps: Both;10 reps;Seated Long Arc Quad: Both;10 reps;Seated Hip Flexion/Marching: Both;10 reps;Seated Toe Raises: Both;10 reps;Seated Heel Raises: Both;10 reps;Seated    General Comments        Pertinent Vitals/Pain Pain Assessment: Faces Faces Pain Scale: Hurts a little bit Pain Location: left knee Pain Descriptors / Indicators: Sore Pain Intervention(s): Monitored during session;Repositioned    Home Living                      Prior Function            PT Goals (current goals can now be found in the care plan section) Progress towards PT goals: Progressing toward goals    Frequency  Min 3X/week    PT Plan Current plan remains appropriate    Co-evaluation             End of Session Equipment Utilized During Treatment: Gait belt  Activity Tolerance: Patient tolerated treatment well Patient left: in chair;with call bell/phone within reach     Time: 1347-1410 PT Time Calculation (min) (ACUTE ONLY): 23 min  Charges:  $Gait Training: 8-22 mins $Therapeutic Activity: 8-22 mins                    G Codes:      Luke Falero A Keionna Kinnaird 08/27/2015, 2:26 PM Wray Kearns, [place], DPT [phone]

## 2015-08-27 NOTE — Progress Notes (Signed)
[place] for Heparin Indication: Afib  Allergies  Allergen Reactions  . Codeine     REACTION: nausea    Patient Measurements: TBW 88.5 kg IBW 68.4 kg Heparin dosing weight: 86.4 kg  Vital Signs: Temp: 97.4 F (36.3 C) (10/10 0943) Temp Source: Oral (10/10 0943) BP: 192/74 mmHg (10/10 0943) Pulse Rate: 70 (10/10 0943)  Labs:  Recent Labs  08/25/15 0403 08/26/15 0811 08/26/15 1911 08/27/15 0413 08/27/15 1109  HGB 11.9* 12.5*  --  11.9*  --   HCT 34.9* 37.1*  --  35.5*  --   PLT 153 166  --  159  --   APTT 85*  --   --   --   --   HEPARINUNFRC 0.36 0.29* 0.27* 0.51 0.55  CREATININE 1.16 1.12  --  1.21  --     Estimated Creatinine Clearance: 41.5 mL/min (by C-G formula based on Cr of 1.21).   Assessment: 81yo male with history of ileus, CKD3, Afib on xarelto and esophageal stricture  presents on 10/3 with emesis and nausea. Pharmacy is consulted to dose heparin for atrial fibrillation while patient is NPO.  Heparin level is therapeutic x2 on current rate.  CBC stable, no s/sx bleeding.   Goal of Therapy:  Heparin level 0.3-0.7 units/ml Monitor platelets by anticoagulation protocol: Yes    Plan:  Continue heparin drip at 1250 units/hr Monitor daily heparin level, CBC, s/s of bleed F/u restart of [place], PharmD, BCPS Clinical Pharmacist Pager: [phone] 08/27/2015 11:36 AM

## 2015-08-27 NOTE — Progress Notes (Signed)
NGT advanced forward by about 2 inches this morning as advised by PA

## 2015-08-27 NOTE — Progress Notes (Signed)
Patient ID: [name], male   DOB: February 14, 1920, 81 y.o.   MRN: [id number]     [facility]      [address] [place], [place] [address]    Phone: [phone] FAX: [phone]     Subjective: Has not had anything PO.  BM this AM. No n/v.  No pain. k low, IV supplement.   Objective:  Vital signs:  Filed Vitals:   08/26/15 2208 08/27/15 0425 08/27/15 0900 08/27/15 0943  BP: 179/73 187/71 174/65 192/74  Pulse: 72 70  70  Temp: 97.5 F (36.4 C) 98.8 F (37.1 C)  97.4 F (36.3 C)  TempSrc: Axillary Axillary  Oral  Resp: 18 20  18   Height:      Weight:  89.9 kg (198 lb 3.1 oz)    SpO2: 98% 97%  97%    Last BM Date: 08/27/15  Intake/Output   Yesterday:  10/09 0701 - 10/10 0700 In: 1491.2 [P.O.:650; I.V.:841.2] Out: 1925 [Urine:1725; Emesis/NG output:200] This shift:      Physical Exam: General: Pt awake/alert/oriented x4 in no acute distress Abdomen: Soft.  distended.  Non tender. No evidence of peritonitis.  No incarcerated hernias.   Problem List:   Principal Problem:   Ileus ([place]) Active Problems:   SICK SINUS SYNDROME   Diverticulosis of large intestine   Benign prostatic hypertrophy (BPH) with incomplete bladder emptying   CHRONIC KIDNEY DISEASE STAGE III (MODERATE)   Anticoagulant long-term use   AF (atrial fibrillation) (HCC)   Hx SBO   S/P appendectomy   Pacemaker - Medtronic Adapta 2006, gen change 2015   Esophageal stricture   Second degree AV block   Uncontrollable vomiting   Small bowel obstruction (HCC)   Abdominal distension   SBO (small bowel obstruction) (HCC)   Pressure ulcer stage 2   Pressure ulcer, stage 2    Results:   Labs: Results for orders placed or performed during the hospital encounter of 08/20/15 (from the past 48 hour(s))  CBC     Status: Abnormal   Collection Time: 08/26/15  8:11 AM  Result Value Ref Range   WBC 8.3 4.0 - 10.5 K/uL   RBC 4.11 (L) 4.22 - 5.81  MIL/uL   Hemoglobin 12.5 (L) 13.0 - 17.0 g/dL   HCT 37.1 (L) 39.0 - 52.0 %   MCV 90.3 78.0 - 100.0 fL   MCH 30.4 26.0 - 34.0 pg   MCHC 33.7 30.0 - 36.0 g/dL   RDW 13.9 11.5 - 15.5 %   Platelets 166 150 - 400 K/uL  Heparin level (unfractionated)     Status: Abnormal   Collection Time: 08/26/15  8:11 AM  Result Value Ref Range   Heparin Unfractionated 0.29 (L) 0.30 - 0.70 IU/mL    Comment:        IF HEPARIN RESULTS ARE BELOW EXPECTED VALUES, AND PATIENT DOSAGE HAS BEEN CONFIRMED, SUGGEST FOLLOW UP TESTING OF ANTITHROMBIN III LEVELS.   Basic metabolic panel     Status: Abnormal   Collection Time: 08/26/15  8:11 AM  Result Value Ref Range   Sodium 133 (L) 135 - 145 mmol/L   Potassium 3.2 (L) 3.5 - 5.1 mmol/L   Chloride 99 (L) 101 - 111 mmol/L   CO2 26 22 - 32 mmol/L   Glucose, Bld 112 (H) 65 - 99 mg/dL   BUN 7 6 - 20 mg/dL   Creatinine, Ser 1.12 0.61 - 1.24 mg/dL   Calcium  8.0 (L) 8.9 - 10.3 mg/dL   GFR calc non Af Amer 55 (L) >60 mL/min   GFR calc Af Amer >60 >60 mL/min    Comment: (NOTE) The eGFR has been calculated using the CKD EPI equation. This calculation has not been validated in all clinical situations. eGFR's persistently <60 mL/min signify possible Chronic Kidney Disease.    Anion gap 8 5 - 15  Magnesium     Status: None   Collection Time: 08/26/15  8:11 AM  Result Value Ref Range   Magnesium 1.8 1.7 - 2.4 mg/dL  Heparin level (unfractionated)     Status: Abnormal   Collection Time: 08/26/15  7:11 PM  Result Value Ref Range   Heparin Unfractionated 0.27 (L) 0.30 - 0.70 IU/mL    Comment:        IF HEPARIN RESULTS ARE BELOW EXPECTED VALUES, AND PATIENT DOSAGE HAS BEEN CONFIRMED, SUGGEST FOLLOW UP TESTING OF ANTITHROMBIN III LEVELS.   Heparin level (unfractionated)     Status: None   Collection Time: 08/27/15  4:13 AM  Result Value Ref Range   Heparin Unfractionated 0.51 0.30 - 0.70 IU/mL    Comment:        IF HEPARIN RESULTS ARE BELOW EXPECTED VALUES,  AND PATIENT DOSAGE HAS BEEN CONFIRMED, SUGGEST FOLLOW UP TESTING OF ANTITHROMBIN III LEVELS.   CBC     Status: Abnormal   Collection Time: 08/27/15  4:13 AM  Result Value Ref Range   WBC 8.8 4.0 - 10.5 K/uL   RBC 3.93 (L) 4.22 - 5.81 MIL/uL   Hemoglobin 11.9 (L) 13.0 - 17.0 g/dL   HCT 35.5 (L) 39.0 - 52.0 %   MCV 90.3 78.0 - 100.0 fL   MCH 30.3 26.0 - 34.0 pg   MCHC 33.5 30.0 - 36.0 g/dL   RDW 14.0 11.5 - 15.5 %   Platelets 159 150 - 400 K/uL  Basic metabolic panel     Status: Abnormal   Collection Time: 08/27/15  4:13 AM  Result Value Ref Range   Sodium 137 135 - 145 mmol/L   Potassium 3.2 (L) 3.5 - 5.1 mmol/L   Chloride 102 101 - 111 mmol/L   CO2 27 22 - 32 mmol/L   Glucose, Bld 115 (H) 65 - 99 mg/dL   BUN 7 6 - 20 mg/dL   Creatinine, Ser 1.21 0.61 - 1.24 mg/dL   Calcium 8.3 (L) 8.9 - 10.3 mg/dL   GFR calc non Af Amer 50 (L) >60 mL/min   GFR calc Af Amer 58 (L) >60 mL/min    Comment: (NOTE) The eGFR has been calculated using the CKD EPI equation. This calculation has not been validated in all clinical situations. eGFR's persistently <60 mL/min signify possible Chronic Kidney Disease.    Anion gap 8 5 - 15    Imaging / Studies: Dg Abd 2 Views  08/27/2015   CLINICAL DATA:  Ileus.  EXAM: ABDOMEN - 2 VIEW  COMPARISON:  None.  FINDINGS: NG tube tip noted projected over the mid esophagus. Cardiac pacer noted . No prominent bowel distention. No free air. Degenerative changes lumbar spine right hip. Total left hip replacement. Basal pleural parenchymal thickening consistent with atelectasis and/or scarring noted. Small pleural effusions.  IMPRESSION: 1. NG tube tip noted over the mid esophagus. 2. No prominent bowel distention or free air. 3. Bibasilar atelectasis and/or scarring. Small pleural effusions cannot be excluded .  Critical Value/emergent results were called by telephone at the time of interpretation on  08/27/2015 at 9:43 am to [place], who verbally acknowledged these  results.   Electronically Signed   By: Marcello Moores  Register   On: 08/27/2015 09:44    Medications / Allergies:  Scheduled Meds: . metoprolol  2.5 mg Intravenous Q12H  . pantoprazole (PROTONIX) IV  40 mg Intravenous Q12H  . potassium chloride  40 mEq Oral Once  . sodium chloride  3 mL Intravenous Q12H   Continuous Infusions: . dextrose 5 % and 0.9 % NaCl with KCl 40 mEq/L 75 mL/hr at 08/26/15 2216  . heparin 1,250 Units/hr (08/26/15 2100)   PRN Meds:.sodium chloride, acetaminophen, MUSCLE RUB, ondansetron (ZOFRAN) IV, sodium chloride  Antibiotics: Anti-infectives    None        Assessment/Plan SBO v ileus-allow for clears.  Advance NGT, leave clamped.  Will DC later today once we know he can tolerate some PO since it was placed by IR.  VTE prophylaxis-heparin gtt for AF FEN-clears.  Supplement K aggressively.   Erby Pian, [facility] Pager [phone]) For consults and floor pages call [phone]([id number]:30P)  08/27/2015 10:17 AM

## 2015-08-27 NOTE — Progress Notes (Signed)
. Family Medicine Teaching Service Daily Progress Note Intern Pager: [phone]  Patient name: [name] Medical record number: [id number] Date of birth: Sep 13, 1920 Age: 81 y.o. Gender: male  Primary Care Provider: [name], MD Consultants: None Code Status: DNR  Pt Overview and Major Events to Date:  10/1: CT Abdomen Pelvis 10/3: DG Abd Acute w/Chest 10/6: DG Abd 1 View 10/7: NG tube placed 10/8: xray shows resolution of SBO  Assessment and Plan: [name] is a 81 y.o. male presenting with nausea and vomiting with radiographic evidence of ileus, now clinically resolved. PMH is significant for history of ileus, appendectomy, esophageal stricture, diverticulosis, DNR, AFib on xarelto with PPM, stage III CKD, and BPH.  Partial SBO: Air-fluid levels in small bowel on CT scan. Pain, N/V resolved. Partial SBO resolved on KUB - NG tube decompression, clamped 10/8. Await surgery recs on pulling tube and starting clears. - NPO, with ice chips and sips for meds - MIVF 27ml/h (decreased 2/2 overload over the weekend) - BMx1 this AM - holding Xarelto, continue heparin until able to resume PO meds - PRNs, none needed during stay: Zofran 4mg  IV q8h prn nausea, morphine 1mg  IV prn abdominal pain - Start transitioning meds IV-->PO - F/u repeat abdominal XR this AM  Paroxysmal atrial fibrillation/HTN: s/p cardioversion, now with PPM in situ, on dronedarone and bystolic. Followed by Dr. [name]. Troponin neg x3. ECG shows dual paced rhythm so offers little insight into cardiac ischemia. Last echo was 2006 with preserved EF and currently euvolemic.  - Hold home nebivolol, ramipril, and dronedarone while NPO, restart when PO per cards - Metoprolol IV while NPO, transition back to PO antihypertensives today - Holding Xarelto, continue heparin until able to take PO  Hypokalemia: K 3.2, s/p 58mEq on 10/9 - 40 mEq KCl today - Daily BMP  Pressure sore: sacral. - Clean dressing in  place.  Knee pain: tenderness to palpation over left lateral knee, no patellar tenderness. - Muscle rub - Ice pack  BPH, controlled:  - Strict I/O - Hold Rapaflo while NPO  Stage III CKD: creat at/below historical baseline - MIVF  Esophageal stricture/dysmotility: Not thought to be contributing to presentation. Followed by Dr. Hilarie Fredrickson, last EGD with dilation Nov 2015 otherwise noted to be normal. - No intervention planned.  FEN/GI: NPO, MIVF, ice chips and sips for meds Prophylaxis: Heparin per pharmacy for therapeutic anticoagulation until we can transition back to Xarelto.  Disposition: SNF, pt requested [facility]  Subjective:  No acute events overnight. Patient reports that he continues to feel better this morning, passing flatus, BMx1 this AM, denies nausea, no emesis. C/o pain over left lateral knee, thinks he may of twisted it getting out of bed over the weekend.  Objective: Temp:  [97.5 F (36.4 C)-98.8 F (37.1 C)] 98.8 F (37.1 C) (10/10 0425) Pulse Rate:  [70-72] 70 (10/10 0425) Resp:  [18-20] 20 (10/10 0425) BP: (172-187)/(71-73) 187/71 mmHg (10/10 0425) SpO2:  [97 %-99 %] 97 % (10/10 0425) Weight:  [198 lb 3.1 oz (89.9 kg)] 198 lb 3.1 oz (89.9 kg) (10/10 0425) Physical Exam: General: Alert, comfortably sitting up in chair, NG tube clamped and in place Cardiovascular: RRR, no LE edema Respiratory: CTAB, no crackles Abdomen: frequent bowel sounds, soft, distended but obese abdomen, nontender. MSK: tenderness to palpation over left lateral knee, no patellar tenderness. Skin: clean sacral dressing in place over pressure sore.  Laboratory:  Recent Labs Lab 08/25/15 0403 08/26/15 6440 08/27/15 0413  WBC 8.2 8.3 8.8  HGB 11.9* 12.5* 11.9*  HCT 34.9* 37.1* 35.5*  PLT 153 166 159    Recent Labs Lab 08/25/15 0403 08/26/15 0811 08/27/15 0413  NA 139 133* 137  K 3.2* 3.2* 3.2*  CL 106 99* 102  CO2 26 26 27   BUN 10 7 7   CREATININE 1.16 1.12  1.21  CALCIUM 8.0* 8.0* 8.3*  GLUCOSE 125* 112* 115*    Imaging/Diagnostic Tests: 10/8 DG Abd: 1. Nasogastric tube placement to the gastric antrum with resolution of small bowel distension.  10/6 DG Abd 1 View Increasing small bowel distention when compared with the prior exam consistent with a progressive partial small bowel obstruction. No free air is seen.  10/3 DG ABDOMEN ACUTE W/ 1V CHEST: Small bowel distention with air-fluid levels. Moderate amount of stool and air in the large bowel; findings suggest ileus.  10/1 CT Abd Pelvis wo contrast: Gastric and proximal small bowel distention with air and contrast, nonspecific. No definite obstruction pattern. Negative for free air.   [name], MS4  08/27/2015 8:56 AM    Resident Addendum  [name] is a 81 y.o. male presenting with nausea and vomiting with radiographic evidence of ileus, now clinically resolved. PMH is significant for history of ileus, appendectomy, esophageal stricture, diverticulosis, DNR, AFib on xarelto with PPM, stage III CKD, and BPH.  S: Pt denies nausea/vomiting, denies abd pain. HAs had a BM this AM, Continue to have flatus  BP 192/74 mmHg  Pulse 70  Temp(Src) 97.4 F (36.3 C) (Oral)  Resp 18  Ht 5\' 8"  (1.727 m)  Wt 198 lb 3.1 oz (89.9 kg)  BMI 30.14 kg/m2  SpO2 97%  Exam:  Gen: NAD sitting in bedside chair HEENT: NGT in place CV: RRR, no murmurs Pulm: CTAB ABD: midly distended, non tender + BS Skin: stage 1 pressure ulcer on left aspect of gluteal cleft, no drainage or ulceration   A/P    Partial SBO: Resolved on abd XR on 10/10 - NGT clamp trial today--> follow tolerance - advance diet to clears  - continue IVF until proves can tolerate good PO -  Zofran 4mg  IV q8h prn nausea, morphine 1mg  IV prn abdominal pain - resume home PO meds - F/u repeat abdominal XR this AM  Paroxysmal atrial fibrillation/HTN: Stable with pace maker in place  - restart home nebivolol, ramipril,  and dronedarone  - Holding Xarelto, continue heparin proven to take good PO  Hypokalemia: K 3.2, s/p 42mEq on 10/9 - 40 mEq KCl today - Daily BMP  Pressure ulcer: Stage 1 sacral pressure ulcer - Wound care consult - encourage frequent position changes and ambulation  Knee pain:Stable improved with asper cream - Muscle rub - Ice pack  BPH, controlled:  - Strict I/O - continue home rapaflo  Stage III CKD: creat at/below historical baseline - MIVF  Prophylaxis: heparin   Alyssa A. Lincoln Brigham MD, [facility] Resident PGY-2 Pager [phone]

## 2015-08-27 NOTE — Progress Notes (Signed)
Patient Name: [name] Date of Encounter: 08/27/2015  Primary cardiologist: Dr. [name]   Principal Problem:   Ileus [facility]) Active Problems:   SICK SINUS SYNDROME   Diverticulosis of large intestine   Benign prostatic hypertrophy (BPH) with incomplete bladder emptying   CHRONIC KIDNEY DISEASE STAGE III (MODERATE)   Anticoagulant long-term use   AF (atrial fibrillation) (HCC)   Hx SBO   S/P appendectomy   Pacemaker - Medtronic Adapta 2006, gen change 2015   Esophageal stricture   Second degree AV block   Uncontrollable vomiting   Small bowel obstruction (HCC)   Abdominal distension   SBO (small bowel obstruction) (HCC)   Pressure ulcer stage 2   Pressure ulcer, stage 2    SUBJECTIVE  Feeling ok today, eating for the first time. Did have a small BM this morning. No significant nausea with eating.   CURRENT MEDS . dronedarone  400 mg Oral BID WC  . nebivolol  10 mg Oral [id number]  . pantoprazole (PROTONIX) IV  40 mg Intravenous Q12H  . ramipril  2.5 mg Oral q morning - 10a  . sodium chloride  3 mL Intravenous Q12H    OBJECTIVE  Filed Vitals:   08/26/15 2208 08/27/15 0425 08/27/15 0900 08/27/15 0943  BP: 179/73 187/71 174/65 192/74  Pulse: 72 70  70  Temp: 97.5 F (36.4 C) 98.8 F (37.1 C)  97.4 F (36.3 C)  TempSrc: Axillary Axillary  Oral  Resp: 18 20  18   Height:      Weight:  198 lb 3.1 oz (89.9 kg)    SpO2: 98% 97%  97%    Intake/Output Summary (Last 24 hours) at 08/27/15 1315 Last data filed at 08/27/15 0606  Gross per 24 hour  Intake 1491.15 ml  Output   1150 ml  Net 341.15 ml   Filed Weights   08/25/15 0604 08/26/15 0531 08/27/15 0425  Weight: 197 lb 3.2 oz (89.449 kg) 198 lb 3.2 oz (89.903 kg) 198 lb 3.1 oz (89.9 kg)    PHYSICAL EXAM  General: Pleasant, NAD. Neuro: Alert and oriented X 3. Moves all extremities spontaneously. Psych: Normal affect. HEENT:  Normal. NG tube in place Neck: Supple without bruits or JVD. Lungs:  Resp  regular and unlabored, CTA. Heart: RRR no s3, s4, or murmurs. Abdomen: Soft, non-tender. distended  Extremities: No clubbing, cyanosis or edema. DP/PT/Radials 2+ and equal bilaterally.  Accessory Clinical Findings  CBC  Recent Labs  08/26/15 0811 08/27/15 0413  WBC 8.3 8.8  HGB 12.5* 11.9*  HCT 37.1* 35.5*  MCV 90.3 90.3  PLT 166 824   Basic Metabolic Panel  Recent Labs  08/25/15 0403 08/26/15 0811 08/27/15 0413  NA 139 133* 137  K 3.2* 3.2* 3.2*  CL 106 99* 102  CO2 26 26 27   GLUCOSE 125* 112* 115*  BUN 10 7 7   CREATININE 1.16 1.12 1.21  CALCIUM 8.0* 8.0* 8.3*  MG 1.6* 1.8  --     TELE Off telemetry    ECG  No new EKG  Radiology/Studies  Ct Abdomen Pelvis Wo Contrast  08/18/2015   CLINICAL DATA:  Acute epigastric abdominal pain for 2 days. Syncope.  EXAM: CT ABDOMEN AND PELVIS WITHOUT CONTRAST  TECHNIQUE: Multidetector CT imaging of the abdomen and pelvis was performed following the standard protocol without IV contrast.  COMPARISON:  08/31/2014, 01/05/2014  FINDINGS: Lower chest: Pacemaker in the rest of the heart. Heart is enlarged. Thoracic aortic atherosclerosis and coronary calcifications noted.  No pericardial or pleural effusion. No significant hiatal hernia.  Lower thoracic degenerative osteophytes on the right.  Minor basilar scarring.  Otherwise lung bases clear.  Abdomen: Liver, gallbladder, biliary system, atrophic fatty replaced pancreas, and adrenal glands are within normal limits for age and noncontrast imaging. Spleen demonstrates scattered calcifications from granulomatous disease. Small accessory splenules in the left upper quadrant.  Kidneys demonstrate no acute obstruction, hydronephrosis, perinephric inflammatory process, or obstructing ureteral calculus on either side.  Aortic atherosclerosis noted without aneurysm or retroperitoneal hemorrhage.  Mild to moderate distention of the stomach and proximal small bowel with contrast and air. Distal  small bowel is collapsed however there is air in the colon. Colonic diverticulosis noted. Appearance of the bowel is nonspecific.  No abdominal free fluid, fluid collection, hemorrhage, abscess, or adenopathy.  Pelvis: Diverticulosis of the descending colon and sigmoid. No acute inflammatory process. No pelvic free fluid, fluid collection, hemorrhage, abscess, adenopathy, inguinal abnormality, or hernia. Urinary bladder unremarkable. No acute distal bowel process.  Left hip replacement noted creating artifact. Degenerative changes of the right hip, pelvis and spine.  IMPRESSION: Gastric and proximal small bowel distention with air and contrast, nonspecific. No definite obstruction pattern. Negative for free air.  No acute obstructing urinary tract or ureteral calculus. No hydronephrosis or obstructive uropathy  Abdominal atherosclerosis  Colonic diverticulosis without acute inflammation   Electronically Signed   By: Jerilynn Mages.  Shick M.D.   On: 08/18/2015 14:30   Dg Abd 1 View  08/25/2015   CLINICAL DATA:  Small bowel obstruction. Pt does have a NG tube.  EXAM: ABDOMEN - 1 VIEW  COMPARISON:  08/23/2015  FINDINGS: Nasogastric tube has been placed at least as far as the gastric antrum. Small bowel is decompressed since previous exam. Normal distribution of gas and stool throughout the colon. Spondylitic changes in the lumbar spine.  Left hip arthroplasty hardware partially visualized.  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  IMPRESSION: 1. Nasogastric tube placement to the gastric antrum with resolution of small bowel distension.   Electronically Signed   By: Lucrezia Europe M.D.   On: 08/25/2015 09:21   Dg Abd 1 View  08/24/2015   CLINICAL DATA:  Nasogastric tube placement under fluoroscopy.  EXAM: ABDOMEN - 1 VIEW  COMPARISON:  08/23/2015  FINDINGS: Nasogastric tube passes below the diaphragm to have its tip in the mid to distal stomach, well positioned.  IMPRESSION: Well-positioned nasogastric tube.    Electronically Signed   By: Lajean Manes M.D.   On: 08/24/2015 08:54   Dg Abd 1 View  08/23/2015   CLINICAL DATA:  Abdominal pain and distension  EXAM: ABDOMEN - 1 VIEW  COMPARISON:  08/20/2015  FINDINGS: Scattered large and small bowel gas is noted. Persistent and slightly increased small bowel dilatation is noted. The stomach is distended with air. No abnormal mass or abnormal calcifications are noted. Postsurgical changes in the left hip are noted. No free air is noted.  IMPRESSION: Increasing small bowel distention when compared with the prior exam consistent with a progressive partial small bowel obstruction. No free air is seen.   Electronically Signed   By: Inez Catalina M.D.   On: 08/23/2015 14:56   Dg Abd 2 Views  08/27/2015   CLINICAL DATA:  Ileus.  EXAM: ABDOMEN - 2 VIEW  COMPARISON:  None.  FINDINGS: NG tube tip noted projected over the mid esophagus. Cardiac pacer noted . No prominent bowel distention. No free air. Degenerative changes lumbar spine right  hip. Total left hip replacement. Basal pleural parenchymal thickening consistent with atelectasis and/or scarring noted. Small pleural effusions.  IMPRESSION: 1. NG tube tip noted over the mid esophagus. 2. No prominent bowel distention or free air. 3. Bibasilar atelectasis and/or scarring. Small pleural effusions cannot be excluded .  Critical Value/emergent results were called by telephone at the time of interpretation on 08/27/2015 at 9:43 am to [place], who verbally acknowledged these results.   Electronically Signed   By: Marcello Moores  Register   On: 08/27/2015 09:44   Dg Abd Acute W/chest  08/20/2015   CLINICAL DATA:  Epigastric pain, nausea and vomiting and fever for 3 days. History of diverticulosis, chronic kidney disease, hypertension.  EXAM: DG ABDOMEN ACUTE W/ 1V CHEST  COMPARISON:  CT abdomen pelvis August 18, 2015  FINDINGS: The cardiac silhouette is mildly enlarged, mediastinal silhouette is nonsuspicious, mildly calcified aortic  knob. Strandy densities LEFT lung base. Blunting of the RIGHT costophrenic angle. No focal consolidation. No pneumothorax. Dual lead LEFT cardiac pacemaker with lead tips projecting RIGHT atrium and RIGHT ventricle. No pneumothorax. Soft tissue planes and included osseous structure nonsuspicious. Mild degenerative change of thoracic spine.  Scattered air-fluid levels throughout the abdomen, with gas distended small bowel, moderate amount of air, stool and contrast in the large bowel. No free air. No intra-abdominal mass effect or pathologic calcifications. No free air. Moderate degenerative change of the lumbar spine. LEFT hip total arthroplasty partially imaged severe degenerative change of the RIGHT hip.  IMPRESSION: Mild cardiomegaly. Small RIGHT pleural effusion versus pleural thickening. LEFT lung base atelectasis.  Small bowel distention with air-fluid levels. Moderate amount of stool and air in the large bowel; findings suggest ileus.   Electronically Signed   By: Elon Alas M.D.   On: 08/20/2015 06:03   Dg Addison Bailey G Tube Plc W/fl-no Rad  08/24/2015   CLINICAL DATA:    NASO G TUBE PLACEMENT WITH FLUORO  Fluoroscopy was utilized by the requesting physician.  No radiographic  interpretation.     ASSESSMENT AND PLAN  81 y.o. year old male with a history of ileus, appendectomy, esophageal stricture, diverticulosis, DNR, AFib on xarelto with MDT PPM, stage III CKD, DNR and BPH. Admitted 10/03 with SBO. He has improved with medical therapy, no surgery required. He was initially ready for d/c to SNF, but had recurrence of his obstruction and surgery is seeing him. Felt to be ileus all along. Restart bystolic and multaq.   1. Ileus: improving  - starting solid food today. Bystolic and multaq restarted. Expect to restart Xarelto later today or tomorrow if tolerating PO med. No further cardiac recommendation.   2. H/o bradycardia and 2nd degree AV block with periods of 3rd degree HB s/p dual chamber  PPM  3. H/o PAF: off telemetry, HR RRR on exam, unknown if paced and what's underlying rhythm  - stable from cardiac perspective.   Signed, Almyra Deforest PA-C Pager: [phone]

## 2015-08-27 NOTE — Progress Notes (Signed)
ANTICOAGULATION CONSULT NOTE - Follow Up Consult  Pharmacy Consult for heparin Indication: atrial fibrillation   Labs:  Recent Labs  08/24/15 1056  08/25/15 0403 08/26/15 0811 08/26/15 1911 08/27/15 0413  HGB  --   < > 11.9* 12.5*  --  11.9*  HCT  --   --  34.9* 37.1*  --  35.5*  PLT  --   --  153 166  --  159  APTT 101*  --  85*  --   --   --   HEPARINUNFRC  --   < > 0.36 0.29* 0.27* 0.51  CREATININE  --   --  1.16 1.12  --   --   < > = values in this interval not displayed.    Assessment/Plan:  81yo male therapeutic on heparin after rate changes. Will continue gtt at current rate and confirm stable with additional level.   Wynona Neat, PharmD, BCPS  08/27/2015,5:09 AM

## 2015-08-28 LAB — BASIC METABOLIC PANEL
Anion gap: 8 (ref 5–15)
BUN: 6 mg/dL (ref 6–20)
CALCIUM: 8.5 mg/dL — AB (ref 8.9–10.3)
CO2: 26 mmol/L (ref 22–32)
CREATININE: 1.05 mg/dL (ref 0.61–1.24)
Chloride: 103 mmol/L (ref 101–111)
GFR calc Af Amer: >60 mL/min (ref >60)
GFR, EST NON AFRICAN AMERICAN: 59 mL/min — AB (ref >60)
GLUCOSE: 115 mg/dL — AB (ref 65–99)
Potassium: 3.9 mmol/L (ref 3.5–5.1)
SODIUM: 137 mmol/L (ref 135–145)

## 2015-08-28 LAB — CBC
HCT: 34.7 % — ABNORMAL LOW (ref 39.0–52.0)
Hemoglobin: 11.7 g/dL — ABNORMAL LOW (ref 13.0–17.0)
MCH: 31 pg (ref 26.0–34.0)
MCHC: 33.7 g/dL (ref 30.0–36.0)
MCV: 91.8 fL (ref 78.0–100.0)
PLATELETS: 153 10*3/uL (ref 150–400)
RBC: 3.78 MIL/uL — ABNORMAL LOW (ref 4.22–5.81)
RDW: 14.6 % (ref 11.5–15.5)
WBC: 10.3 10*3/uL (ref 4.0–10.5)

## 2015-08-28 LAB — HEPARIN LEVEL (UNFRACTIONATED): Heparin Unfractionated: 0.43 IU/mL (ref 0.30–0.70)

## 2015-08-28 MED ORDER — POLYETHYLENE GLYCOL 3350 17 G PO PACK
17.0000 g | PACK | Freq: Two times a day (BID) | ORAL | Status: DC
  Administered 2015-08-28 – 2015-08-30 (×4): 17 g via ORAL
  Filled 2015-08-28 (×5): qty 1

## 2015-08-28 MED ORDER — GUAIFENESIN ER 600 MG PO TB12
600.0000 mg | ORAL_TABLET | Freq: Two times a day (BID) | ORAL | Status: DC | PRN
  Administered 2015-08-29 – 2015-08-30 (×2): 600 mg via ORAL
  Filled 2015-08-28 (×2): qty 1

## 2015-08-28 MED ORDER — PANTOPRAZOLE SODIUM 40 MG IV SOLR
40.0000 mg | Freq: Two times a day (BID) | INTRAVENOUS | Status: AC
  Administered 2015-08-28 (×2): 40 mg via INTRAVENOUS
  Filled 2015-08-28 (×2): qty 40

## 2015-08-28 MED ORDER — SORBITOL 70 % SOLN
960.0000 mL | TOPICAL_OIL | Freq: Once | ORAL | Status: AC
  Administered 2015-08-28: 960 mL via RECTAL
  Filled 2015-08-28 (×2): qty 240

## 2015-08-28 MED ORDER — PANTOPRAZOLE SODIUM 40 MG PO TBEC
40.0000 mg | DELAYED_RELEASE_TABLET | Freq: Every day | ORAL | Status: DC
Start: 2015-08-29 — End: 2015-08-30
  Administered 2015-08-29 – 2015-08-30 (×2): 40 mg via ORAL
  Filled 2015-08-28 (×3): qty 1

## 2015-08-28 MED ORDER — PSYLLIUM 95 % PO PACK
1.0000 | PACK | Freq: Every day | ORAL | Status: DC
  Administered 2015-08-28 – 2015-08-30 (×3): 1 via ORAL
  Filled 2015-08-28 (×3): qty 1

## 2015-08-28 NOTE — Consult Note (Signed)
WOC wound consult note Reason for Consult: Consult requested for buttocks.  Pt states he has an overactive bladder and had problems with the skin to his buttocks prior to admission.  He prefers to wear pull up disposable briefs which were brought in by his family. Pt states he has been putting Vaseline on the affected area. Wound type: Bilat upper buttocks with 2 partial thickness wounds, each .2X.2X.1cm, pink and moist. Partial thickness fissure in between to gluteal fold.  Surrounding skin red and macerated.  Appearance consistent with moisture associated skin damage. Pressure Ulcer POA: This is NOT a pressure ulcer.  It was present on admission. Drainage (amount, consistency, odor) Small amt yellow drainage, no odor Periwound: Intact skin surrounding Dressing procedure/placement/frequency: Foam dressing to protect skin and absorb moisture.  Pt asked what topical treatment is best to use at home.  Discussed use of a barrier cream to repel moisture and protect skin when pt is incontinent.  Optimal plan of care would be to eliminate use of briefs, which can trap moisture against skin, but he prefers to continue use of this product. Please re-consult if further assistance is needed.  Thank-you,  Julien Girt MSN, [place], [place], [place], [place]

## 2015-08-28 NOTE — Progress Notes (Signed)
Subjective:  no complaints, NG out   Objective: Vital signs in last 24 hours: Temp:  [97.5 F (36.4 C)-97.9 F (36.6 C)] 97.5 F (36.4 C) (10/11 0504) Pulse Rate:  [70-74] 73 (10/11 0504) Resp:  [18-19] 18 (10/11 0504) BP: (173-179)/(74-79) 177/79 mmHg (10/11 0504) SpO2:  [97 %-99 %] 98 % (10/11 0504) Weight:  [198 lb 1.6 oz (89.858 kg)] 198 lb 1.6 oz (89.858 kg) (10/11 0504) Weight change: -1.5 oz (-0.043 kg) Last BM Date: September 09, 2015 Intake/Output from previous day: +20 10/10 0701 - 10/11 0700 In: 1020 [P.O.:220; I.V.:800] Out: 1000 [Urine:1000] Intake/Output this shift: Total I/O In: 340 [P.O.:340] Out: -   PE: General:Pleasant affect, NAD Skin:Warm and dry, brisk capillary refill HEENT:normocephalic, sclera clear, mucus membranes moist Heart:S1S2 RRR without murmur, gallup, rub or click Lungs:clear without rales, rhonchi, or wheezes GYK:ZLDJ, non tender, + BS, do not palpate liver spleen or masses Ext:no lower ext edema, 2+ radial pulses Neuro:alert and oriented X 3, MAE, follows commands, + facial symmetry Tele:  Not ordered   Lab Results:  Recent Labs  09/09/2015 0413 08/28/15 0419  WBC 8.8 10.3  HGB 11.9* 11.7*  HCT 35.5* 34.7*  PLT 159 153   BMET  Recent Labs  09-09-15 0413 08/28/15 0419  NA 137 137  K 3.2* 3.9  CL 102 103  CO2 27 26  GLUCOSE 115* 115*  BUN 7 6  CREATININE 1.21 1.05  CALCIUM 8.3* 8.5*   No results for input(s): TROPONINI in the last 72 hours.  Invalid input(s): CK, MB  Lab Results  Component Value Date   CHOL 152 07/05/2008   HDL 28* 07/05/2008   LDLCALC 61 07/05/2008   LDLDIRECT 84 12/06/2014   TRIG 315* 07/05/2008   CHOLHDL 5.4 Ratio 07/05/2008   Lab Results  Component Value Date   HGBA1C 6.0 12/06/2014     Lab Results  Component Value Date   TSH 3.688 04/06/2007      Studies/Results: Dg Abd 2 Views  09-09-15   CLINICAL DATA:  Ileus.  EXAM: ABDOMEN - 2 VIEW  COMPARISON:  None.  FINDINGS: NG  tube tip noted projected over the mid esophagus. Cardiac pacer noted . No prominent bowel distention. No free air. Degenerative changes lumbar spine right hip. Total left hip replacement. Basal pleural parenchymal thickening consistent with atelectasis and/or scarring noted. Small pleural effusions.  IMPRESSION: 1. NG tube tip noted over the mid esophagus. 2. No prominent bowel distention or free air. 3. Bibasilar atelectasis and/or scarring. Small pleural effusions cannot be excluded .  Critical Value/emergent results were called by telephone at the time of interpretation on 2015-09-09 at 9:43 am to [address], who verbally acknowledged these results.   Electronically Signed   By: Marcello Moores  Register   On: 09-Sep-2015 09:44    Medications: I have reviewed the patient's current medications. Scheduled Meds: . dronedarone  400 mg Oral BID WC  . nebivolol  10 mg Oral BH-q7a  . [START ON 08/29/2015] pantoprazole  40 mg Oral QAC breakfast  . pantoprazole (PROTONIX) IV  40 mg Intravenous Q12H  . polyethylene glycol  17 g Oral BID  . psyllium  1 packet Oral Daily  . ramipril  2.5 mg Oral q morning - 10a  . silodosin  4 mg Oral Q breakfast  . sodium chloride  3 mL Intravenous Q12H   Continuous Infusions: . dextrose 5 % and 0.9 % NaCl with KCl 40 mEq/L 75 mL/hr at  08/27/15 2131  . heparin 1,250 Units/hr (08/28/15 1250)   PRN Meds:.sodium chloride, acetaminophen, guaiFENesin, MUSCLE RUB, ondansetron (ZOFRAN) IV, sodium chloride  Assessment/Plan: Principal Problem:   Ileus (HCC) Active Problems:   SICK SINUS SYNDROME   Diverticulosis of large intestine   Benign prostatic hypertrophy (BPH) with incomplete bladder emptying   CHRONIC KIDNEY DISEASE STAGE III (MODERATE)   Anticoagulant long-term use   AF (atrial fibrillation) (HCC)   Hx SBO   S/P appendectomy   Pacemaker - Medtronic Adapta 2006, gen change 2015   Esophageal stricture   Second degree AV block   Uncontrollable vomiting   Small bowel  obstruction (HCC)   Abdominal distension   SBO (small bowel obstruction) (HCC)   Pressure ulcer stage 2   Pressure ulcer, stage 2   Essential hypertension 81 y.o. year old male with a history of ileus, appendectomy, esophageal stricture, diverticulosis, DNR, AFib on xarelto with MDT PPM, stage III CKD, DNR and BPH. Admitted 10/03 with SBO. He has improved with medical therapy, no surgery required. He was initially ready for d/c to SNF, but had recurrence of his obstruction and surgery is seeing him. Felt to be ileus all along. Restarted bystolic and multaq. xarelto on hold-pt on IV heparin.   1. Ileus: improving - starting solid food yesterday. Doing well.  Bystolic and multaq restarted.  No further cardiac recommendation.  2. H/o bradycardia and 2nd degree AV block with periods of 3rd degree HB s/p dual chamber PPM for greater than 2 years.  3. H/o PAF: off telemetry, HR RRR on exam, unknown if paced and what's underlying rhythm- has been maintaining SR on pacer checks. Last check in July 2016 and he did have mode switching but for < 1 min.  - stable from cardiac perspective.              - resume xarelto per surgery. On heparin now     LOS: 8 days   Time spent with pt. :15 minutes. [facility]  Nurse Practitioner Certified Pager [phone] or after 5pm and on weekends call [phone] 08/28/2015, 1:03 PM

## 2015-08-28 NOTE — Progress Notes (Signed)
. Family Medicine Teaching Service Daily Progress Note Intern Pager: [phone]  Patient name: [name] Medical record number: [id number] Date of birth: 03-19-1920 Age: 81 y.o. Gender: male  Primary Care Provider: [name], MD Consultants: None Code Status: DNR  Pt Overview and Major Events to Date:  10/1: CT Abdomen Pelvis 10/3: DG Abd Acute w/Chest 10/6: DG Abd 1 View 10/7: NG tube placed 10/8: xray shows resolution of SBO  Assessment and Plan: BION [name] is a 81 y.o. male presenting with nausea and vomiting with radiographic evidence of ileus and partial SBO, now clinically improved. PMH is significant for history of ileus, appendectomy, esophageal stricture, diverticulosis, DNR, AFib on xarelto with PPM, stage III CKD, and BPH.  Ileus: Air-fluid levels in small bowel on CT scan. Pain, N/V resolved. Partial SBO resolved on KUB - NG tube decompression, clamped 10/8. Surgery to pull NG tube today. - Full liquid diet today - MIVF 28ml/h (decreased 2/2 overload over the weekend), d/c once taking adequate PO. Not fluid overloaded today on exam. - restart Xarelto and d/c heparin today - PRNs, none needed during stay: Zofran 4mg  IV q8h prn nausea, morphine 1mg  IV prn abdominal pain - Continue transitioning meds IV-->PO - Add Miralax, Metamucil, SMOG enema today per surgery  Paroxysmal atrial fibrillation/HTN: s/p cardioversion, now with PPM in situ, on dronedarone and bystolic. Followed by Dr. [name]. Troponin neg x3. ECG shows dual paced rhythm so offers little insight into cardiac ischemia. Last echo was 2006 with preserved EF and currently euvolemic.  - Restarted home nebivolol, ramipril, and dronedarone yesterday, BPs continue in 170s/70s. Would give another day to normalize, f/u OP - restart Xarelto and d/c heparin today  BPH, controlled:  - Strict I/O - Restarted home Rapaflo yesterday at 4mg , consider increasing to 8mg  if necessary  Hypokalemia, resolved: K 3.9  today - Daily BMP while poor PO intake  Pressure sore: sacral, improved today. - Clean dressing in place. - Consulted wound ostomy.  Knee pain: tenderness to palpation over left lateral knee, improved today. - Muscle rub - Ice pack  Inguinal pain: left inguinal pain with coughing. Possible inguinal hernia. Patient states it has been present for over a month. - Recommend f/u with PCP  Stage III CKD: creat at/below historical baseline - MIVF  Esophageal stricture/dysmotility: Not thought to be contributing to presentation. Followed by Dr. Hilarie Fredrickson, last EGD with dilation Nov 2015 otherwise noted to be normal. - No intervention planned.  FEN/GI: Full liquid diet today, MIVF until taking adequate PO Prophylaxis: restart Xarelto and d/c heparin today.  Disposition: SNF, pt requested [facility]  Subjective:  No acute events overnight. Patient reports that he feels bloated this morning, but still passing flatus, denies nausea, no emesis. Felt a bit weak this morning per surgery but when asked this morning states he feels better now. Encouraged PO intake, patient states that "it doesn't take much to get full these days". Patient also complaining of left inguinal pain exacerbated by coughing, present for over 1 month.  Objective: Temp:  [97.4 F (36.3 C)-97.9 F (36.6 C)] 97.5 F (36.4 C) (10/11 0504) Pulse Rate:  [70-74] 73 (10/11 0504) Resp:  [18-19] 18 (10/11 0504) BP: (173-192)/(74-79) 177/79 mmHg (10/11 0504) SpO2:  [97 %-99 %] 98 % (10/11 0504) Weight:  [198 lb 1.6 oz (89.858 kg)] 198 lb 1.6 oz (89.858 kg) (10/11 0504) Physical Exam: General: Alert, comfortably sitting up in bed, NG tube clamped and in place Cardiovascular: RRR, no LE  edema Respiratory: CTAB, no crackles Abdomen: frequent bowel sounds, obese abdomen with increased distension compared to yesterday, nontender. MSK: tenderness to palpation over left lateral knee, no patellar tenderness. Skin: clean  sacral dressing in place over pressure sore.  Laboratory:  Recent Labs Lab 08/26/15 0811 08/27/15 0413 08/28/15 0419  WBC 8.3 8.8 10.3  HGB 12.5* 11.9* 11.7*  HCT 37.1* 35.5* 34.7*  PLT 166 159 153    Recent Labs Lab 08/26/15 0811 08/27/15 0413 08/28/15 0419  NA 133* 137 137  K 3.2* 3.2* 3.9  CL 99* 102 103  CO2 26 27 26   BUN 7 7 6   CREATININE 1.12 1.21 1.05  CALCIUM 8.0* 8.3* 8.5*  GLUCOSE 112* 115* 115*    Imaging/Diagnostic Tests: 10/10 DG Abd: NG tube tip noted over the mid esophagus. No prominent bowel distention or free air.  10/8 DG Abd: Nasogastric tube placement to the gastric antrum with resolution of small bowel distension.  10/6 DG Abd: View Increasing small bowel distention when compared with the prior exam consistent with a progressive partial small bowel obstruction. No free air is seen.  10/3 DG ABDOMEN ACUTE W/ 1V CHEST: Small bowel distention with air-fluid levels. Moderate amount of stool and air in the large bowel; findings suggest ileus.  10/1 CT Abd Pelvis wo contrast: Gastric and proximal small bowel distention with air and contrast, nonspecific. No definite obstruction pattern. Negative for free air.   [name], MS4  08/28/2015 9:05 AM   Resident Addendum  [name] is a 81 y.o. male presenting with nausea and vomiting with radiographic evidence of ileus, now clinically resolved. PMH is significant for history of ileus, appendectomy, esophageal stricture, diverticulosis, DNR, AFib on xarelto with PPM, stage III CKD, and BPH.    S: Feels more " full" than yesterday, has continued to pass gas but last had a BM the day before last   BP 177/79 mmHg  Pulse 73  Temp(Src) 97.5 F (36.4 C) (Oral)  Resp 18  Ht 5\' 8"  (1.727 m)  Wt 198 lb 1.6 oz (89.858 kg)  BMI 30.13 kg/m2  SpO2 98%  Exam: Gen: NAD, initially sleeping but easily awakened CV; RRR, no murmurs auscultated Pulm: CTAB Abd: distended abdomen, more so than last  exam, sone high pitched BS  A/P  Partial SBO:  Concern for incomplete resolution given new clinical complaint of distension - NGT to be removed today - advance diet to fulls per surgery - continue IVF until proves can tolerate good PO - Zofran 4mg  IV q8h prn nausea, morphine 1mg  IV prn abdominal pain - resume home PO meds   Paroxysmal atrial fibrillation/HTN: Stable with pace maker in place  - restart home nebivolol, ramipril, and dronedarone  - Holding Xarelto, continue heparin proven to take good PO  Hypokalemia: Resolved - Daily BMP  Gluteal cleft wound: Likely secondary to exposure urine as pt incontinent and wears a breif - Wound care consult- - barrier cream  Knee pain:Stable improved with asper cream - Muscle rub - Ice pack  BPH, controlled:  - Strict I/O - continue home rapaflo  Stage III CKD: creat at/below historical baseline - MIVF  Prophylaxis: heparin   Jerine Surles A. Lincoln Brigham MD, [place] [facility] Resident PGY-2 Pager [phone]

## 2015-08-28 NOTE — Progress Notes (Signed)
[place] for Heparin Indication: Afib  Allergies  Allergen Reactions  . Codeine     REACTION: nausea    Patient Measurements: TBW 88.5 kg IBW 68.4 kg Heparin dosing weight: 86.4 kg  Vital Signs: Temp: 97.5 F (36.4 C) (10/11 0504) Temp Source: Oral (10/11 0504) BP: 177/79 mmHg (10/11 0504) Pulse Rate: 73 (10/11 0504)  Labs:  Recent Labs  08/26/15 0811  08/27/15 0413 08/27/15 1109 08/28/15 0419  HGB 12.5*  --  11.9*  --  11.7*  HCT 37.1*  --  35.5*  --  34.7*  PLT 166  --  159  --  153  HEPARINUNFRC 0.29*  < > 0.51 0.55 0.43  CREATININE 1.12  --  1.21  --  1.05  < > = values in this interval not displayed.  Estimated Creatinine Clearance: 47.9 mL/min (by C-G formula based on Cr of 1.05).   Assessment: 81yo male with history of ileus, CKD3, Afib on xarelto and esophageal stricture  presents on 10/3 with emesis and nausea. Pharmacy was consulted to dose heparin for atrial fibrillation while patient is NPO.  Diet was initiated yesterday. Heparin level is therapeutic on current rate.  CBC stable, no s/sx bleeding.   Goal of Therapy:  Heparin level 0.3-0.7 units/ml Monitor platelets by anticoagulation protocol: Yes    Plan:  Continue heparin drip at 1250 units/hr Monitor daily heparin level, CBC, s/s of bleed F/u restart of [place], PharmD, BCPS Clinical Pharmacist Pager: [phone] 08/28/2015 7:21 AM

## 2015-08-28 NOTE — Progress Notes (Signed)
  Subjective: Having some flatus, feels weak this am  Objective: Vital signs in last 24 hours: Temp:  [97.4 F (36.3 C)-97.9 F (36.6 C)] 97.5 F (36.4 C) (10/11 0504) Pulse Rate:  [70-74] 73 (10/11 0504) Resp:  [18-19] 18 (10/11 0504) BP: (173-192)/(65-79) 177/79 mmHg (10/11 0504) SpO2:  [97 %-99 %] 98 % (10/11 0504) Weight:  [89.858 kg (198 lb 1.6 oz)] 89.858 kg (198 lb 1.6 oz) (10/11 0504) Last BM Date: 09/13/2015  Intake/Output from previous day: 10/10 0701 - 10/11 0700 In: 1020 [P.O.:220; I.V.:800] Out: 1000 [Urine:1000] Intake/Output this shift:    GI: not really distended softer some bs present nontender  Lab Results:   Recent Labs  09-13-2015 0413 08/28/15 0419  WBC 8.8 10.3  HGB 11.9* 11.7*  HCT 35.5* 34.7*  PLT 159 153   BMET  Recent Labs  09/13/15 0413 08/28/15 0419  NA 137 137  K 3.2* 3.9  CL 102 103  CO2 27 26  GLUCOSE 115* 115*  BUN 7 6  CREATININE 1.21 1.05  CALCIUM 8.3* 8.5*   PT/INR No results for input(s): LABPROT, INR in the last 72 hours. ABG No results for input(s): PHART, HCO3 in the last 72 hours.  Invalid input(s): PCO2, PO2  Studies/Results: Dg Abd 2 Views  2015/09/13   CLINICAL DATA:  Ileus.  EXAM: ABDOMEN - 2 VIEW  COMPARISON:  None.  FINDINGS: NG tube tip noted projected over the mid esophagus. Cardiac pacer noted . No prominent bowel distention. No free air. Degenerative changes lumbar spine right hip. Total left hip replacement. Basal pleural parenchymal thickening consistent with atelectasis and/or scarring noted. Small pleural effusions.  IMPRESSION: 1. NG tube tip noted over the mid esophagus. 2. No prominent bowel distention or free air. 3. Bibasilar atelectasis and/or scarring. Small pleural effusions cannot be excluded .  Critical Value/emergent results were called by telephone at the time of interpretation on September 13, 2015 at 9:43 am to [place], who verbally acknowledged these results.   Electronically Signed   By: Marcello Moores   Register   On: 09/13/15 09:44    Anti-infectives: Anti-infectives    None      Assessment/Plan: SBO v ileus-he has tolerated clamping and will remove ng today, this is ileus I dont think is sbo especially given history of these issues, will start miralax bid and give enema today, advance to fulls  VTE prophylaxis-heparin gtt for AF FEN-fulls Supplement K aggressively it is better today  [facility] 08/28/2015

## 2015-08-28 NOTE — Progress Notes (Signed)
NGT removed as ordered this morning

## 2015-08-29 DIAGNOSIS — I482 Chronic atrial fibrillation: Secondary | ICD-10-CM

## 2015-08-29 LAB — HEPARIN LEVEL (UNFRACTIONATED): HEPARIN UNFRACTIONATED: 0.42 [IU]/mL (ref 0.30–0.70)

## 2015-08-29 LAB — CBC
HEMATOCRIT: 33.4 % — AB (ref 39.0–52.0)
HEMATOCRIT: 32.8 % — AB (ref 39.0–52.0)
HEMOGLOBIN: 11.3 g/dL — AB (ref 13.0–17.0)
HEMOGLOBIN: 11.1 g/dL — AB (ref 13.0–17.0)
MCH: 31 pg (ref 26.0–34.0)
MCH: 31.3 pg (ref 26.0–34.0)
MCHC: 33.8 g/dL (ref 30.0–36.0)
MCHC: 33.8 g/dL (ref 30.0–36.0)
MCV: 91.5 fL (ref 78.0–100.0)
MCV: 92.4 fL (ref 78.0–100.0)
Platelets: 154 10*3/uL (ref 150–400)
Platelets: 142 10*3/uL — ABNORMAL LOW (ref 150–400)
RBC: 3.65 MIL/uL — AB (ref 4.22–5.81)
RBC: 3.55 MIL/uL — AB (ref 4.22–5.81)
RDW: 14.6 % (ref 11.5–15.5)
RDW: 14.6 % (ref 11.5–15.5)
WBC: 12.6 10*3/uL — ABNORMAL HIGH (ref 4.0–10.5)
WBC: 10.7 10*3/uL — AB (ref 4.0–10.5)

## 2015-08-29 MED ORDER — HYDRALAZINE HCL 20 MG/ML IJ SOLN: 5.0000 mg | INTRAMUSCULAR | Status: DC | PRN

## 2015-08-29 MED ORDER — RIVAROXABAN 15 MG PO TABS
15.0000 mg | ORAL_TABLET | Freq: Every day | ORAL | Status: DC
  Administered 2015-08-29: 15 mg via ORAL
  Filled 2015-08-29: qty 1

## 2015-08-29 NOTE — Progress Notes (Addendum)
81 y.o. year old male with a history of ileus, appendectomy, esophageal stricture, diverticulosis, DNR, AFib on xarelto with MDT PPM, stage III CKD, DNR and BPH. Admitted 10/03 with SBO. He has improved with medical therapy, no surgery required. He was initially ready for d/c to SNF, but had recurrence of his obstruction and surgery is seeing him. Felt to be ileus all along. Restarted bystolic and multaq. xarelto on hold-pt on IV heparin. After resuming PO med, Xarelto restarting today.   No further cardiac recommendation. HR stable, regular. Some phlegm last night and mild congestion in the chest, but lung clear. Cardiology signing off, please call with any questions. I will arrange followup.   Hilbert Corrigan PA Pager: [phone]  [place]

## 2015-08-29 NOTE — Progress Notes (Signed)
Physical Therapy Treatment Patient Details Name: [name] MRN: [id number] DOB: 1920/05/24 Today's Date: 08/29/2015    History of Present Illness [name] is a 81 y.o. male presenting with nausea and vomiting with radiographic evidence of ileus. PMH is significant for history of ilues, appendectomy, esophageal stricture, diverticulosis, DNR, AFib on xarelto with PPM, stage III CKD, and BPH.    PT Comments    Good progress towards functional goals. Still requires assist for transfer and close guard for ambulation, using RW for support. Tolerated exercises well. Patient will continue to benefit from skilled physical therapy services to further improve independence with functional mobility.   Follow Up Recommendations  SNF     Equipment Recommendations  None recommended by PT    Recommendations for Other Services       Precautions / Restrictions Precautions Precautions: Fall Restrictions Weight Bearing Restrictions: No    Mobility  Bed Mobility               General bed mobility comments: sitting up in recliner upon arrival  Transfers Overall transfer level: Needs assistance Equipment used: Rolling walker (2 wheeled) Transfers: Sit to/from Stand Sit to Stand: Min assist         General transfer comment: Min guard for safety from recliner with VC for hand placement, using back of knees heavily on recliner for support. Min A for boost from toilet. Moderate sway upon standing initially.  Ambulation/Gait Ambulation/Gait assistance: Min guard Ambulation Distance (Feet): 150 Feet Assistive device: Rolling walker (2 wheeled) Gait Pattern/deviations: Step-through pattern;Decreased stride length;Shuffle;Trunk flexed Gait velocity: slow Gait velocity interpretation: <1.8 ft/sec, indicative of risk for recurrent falls General Gait Details: Slow and guarded. VC for increased stride length and upright posture intermittently. Slight shuffle to gait. relies  heavily on RW for support but no overt loss of balance.   Stairs            Wheelchair Mobility    Modified Rankin (Stroke Patients Only)       Balance                                    Cognition Arousal/Alertness: Awake/alert Behavior During Therapy: WFL for tasks assessed/performed Overall Cognitive Status: Within Functional Limits for tasks assessed                      Exercises General Exercises - Lower Extremity Ankle Circles/Pumps: Both;10 reps;Seated Long Arc Quad: Both;10 reps;Seated    General Comments General comments (skin integrity, edema, etc.): scant amount of bowel movement in toilet.      Pertinent Vitals/Pain Pain Assessment: Faces Faces Pain Scale: Hurts a little bit Pain Location: Lt hip with mobility today Pain Descriptors / Indicators: Aching Pain Intervention(s): Monitored during session;Repositioned    Home Living                      Prior Function            PT Goals (current goals can now be found in the care plan section) Acute Rehab PT Goals Patient Stated Goal: Pt agreeable to short term rehab. "I can't go home like this." PT Goal Formulation: With patient Time For Goal Achievement: 09/04/15 Potential to Achieve Goals: Good Progress towards PT goals: Progressing toward goals    Frequency  Min 3X/week    PT Plan Current plan remains appropriate  Co-evaluation             End of Session Equipment Utilized During Treatment: Gait belt Activity Tolerance: Patient tolerated treatment well Patient left: in chair;with call bell/phone within reach     Time: 1214-1228 PT Time Calculation (min) (ACUTE ONLY): 14 min  Charges:  $Gait Training: 8-22 mins                    G Codes:      Ellouise Newer 2015-09-21, 1:39 PM Camille Bal [place], [place]

## 2015-08-29 NOTE — Progress Notes (Signed)
[facility] for Heparin Indication: Afib  Allergies  Allergen Reactions  . Codeine     REACTION: nausea    Patient Measurements: TBW 88.5 kg IBW 68.4 kg Heparin dosing weight: 86.4 kg  Vital Signs: Temp: 97.5 F (36.4 C) (10/12 0615) BP: 179/77 mmHg (10/12 0615) Pulse Rate: 78 (10/12 0615)  Labs:  Recent Labs  08/27/15 0413 08/27/15 1109 08/28/15 0419 08/29/15 0520  HGB 11.9*  --  11.7* 11.3*  HCT 35.5*  --  34.7* 33.4*  PLT 159  --  153 154  HEPARINUNFRC 0.51 0.55 0.43 0.42  CREATININE 1.21  --  1.05  --     Estimated Creatinine Clearance: 47.9 mL/min (by C-G formula based on Cr of 1.05).   Assessment: 81yo male with history of ileus, CKD3, Afib on xarelto and esophageal stricture  presents on 10/3 with emesis and nausea. Pharmacy was consulted to dose heparin for atrial fibrillation while patient is NPO.  Diet was initiated 10/10. Heparin level is therapeutic on current rate.  CBC stable, no s/sx bleeding.   Goal of Therapy:  Heparin level 0.3-0.7 units/ml Monitor platelets by anticoagulation protocol: Yes    Plan:  Continue heparin drip at 1250 units/hr Monitor daily heparin level, CBC, s/s of bleed F/u restart of [place], PharmD, BCPS Clinical Pharmacist Pager: [phone] 08/29/2015 10:09 AM

## 2015-08-29 NOTE — Discharge Planning (Signed)
CSW continuing to follow and assist with patient's discharge to [facility] once medically stable.  Lubertha Sayres, [place] Clinical Social Work Department Orthopedics [phone]) and Surgical [phone])

## 2015-08-29 NOTE — Progress Notes (Signed)
Patient ID: [name], male   DOB: [birth year], 81 y.o.   MRN: [id number]    Subjective: Pt feels ok this morning.  No nausea.  Tolerating full liquids.  Had a BM yesterday.  No pain.  Objective: Vital signs in last 24 hours: Temp:  [97.5 F (36.4 C)-98.2 F (36.8 C)] 97.5 F (36.4 C) (10/12 0615) Pulse Rate:  [70-78] 78 (10/12 0615) Resp:  [16-18] 16 (10/12 0615) BP: (144-179)/(66-77) 179/77 mmHg (10/12 0615) SpO2:  [96 %-99 %] 98 % (10/12 0615) Last BM Date: 08/28/15  Intake/Output from previous day: 10/11 0701 - 10/12 0700 In: 940 [P.O.:940] Out: 200 [Urine:200] Intake/Output this shift:    PE: Abd: soft, some distention, but otherwise obese, +BS, NT Heart: regular Lungs: CTAB  Lab Results:   Recent Labs  08/28/15 0419 08/29/15 0520  WBC 10.3 12.6*  HGB 11.7* 11.3*  HCT 34.7* 33.4*  PLT 153 154   BMET  Recent Labs  2015/09/18 0413 08/28/15 0419  NA 137 137  K 3.2* 3.9  CL 102 103  CO2 27 26  GLUCOSE 115* 115*  BUN 7 6  CREATININE 1.21 1.05  CALCIUM 8.3* 8.5*   PT/INR No results for input(s): LABPROT, INR in the last 72 hours. CMP     Component Value Date/Time   NA 137 08/28/2015 0419   K 3.9 08/28/2015 0419   CL 103 08/28/2015 0419   CO2 26 08/28/2015 0419   GLUCOSE 115* 08/28/2015 0419   BUN 6 08/28/2015 0419   CREATININE 1.05 08/28/2015 0419   CREATININE 1.33 12/06/2014 1215   CALCIUM 8.5* 08/28/2015 0419   PROT 6.2* 08/20/2015 0525   ALBUMIN 3.6 08/20/2015 0525   AST 33 08/20/2015 0525   ALT 17 08/20/2015 0525   ALKPHOS 39 08/20/2015 0525   BILITOT 1.0 08/20/2015 0525   GFRNONAA 59* 08/28/2015 0419   GFRAA >60 08/28/2015 0419   Lipase     Component Value Date/Time   LIPASE 11* 08/20/2015 0525       Studies/Results: Dg Abd 2 Views  09-18-15  CLINICAL DATA:  Ileus. EXAM: ABDOMEN - 2 VIEW COMPARISON:  None. FINDINGS: NG tube tip noted projected over the mid esophagus. Cardiac pacer noted . No prominent bowel distention. No  free air. Degenerative changes lumbar spine right hip. Total left hip replacement. Basal pleural parenchymal thickening consistent with atelectasis and/or scarring noted. Small pleural effusions. IMPRESSION: 1. NG tube tip noted over the mid esophagus. 2. No prominent bowel distention or free air. 3. Bibasilar atelectasis and/or scarring. Small pleural effusions cannot be excluded . Critical Value/emergent results were called by telephone at the time of interpretation on September 18, 2015 at 9:43 am to [place], who verbally acknowledged these results. Electronically Signed   By: Marcello Moores  Register   On: 09/18/2015 09:44    Anti-infectives: Anti-infectives    None       Assessment/Plan Ileus- -tolerated full liquids yesterday.  Will advance to soft diet today -cont aggressive bowel regimen with BID miralax, etc -no evidence of SBO, no surgical indications -K 3.9 yesterday and not checked today.  Check K within normal range to help bowel motility -minimize narcotics and maximize mobilization as much as possible -we will sign off. VTE prophylaxis-heparin gtt for AF    LOS: 9 days    Dori Devino E 08/29/2015, 8:00 AM Pager: [phone]

## 2015-08-29 NOTE — Progress Notes (Signed)
. Family Medicine Teaching Service Daily Progress Note Intern Pager: [phone]  Patient name: [name] Medical record number: [id number] Date of birth: Jan 12, 1920 Age: 81 y.o. Gender: male  Primary Care Provider: [name], MD Consultants: None Code Status: DNR  Pt Overview and Major Events to Date:  10/1: CT Abdomen Pelvis 10/3: DG Abd Acute w/Chest 10/6: DG Abd 1 View 10/7: NG tube placed 10/8: xray shows resolution of SBO  Assessment and Plan: [name] is a 81 y.o. male presenting with nausea and vomiting with radiographic evidence of ileus and partial SBO, now clinically improved. PMH is significant for history of ileus, appendectomy, esophageal stricture, diverticulosis, DNR, AFib on xarelto with PPM, stage III CKD, and BPH.  Ileus: Concern for worsening abd distension on exam today - s/p NG tube removal 10/11 - soft diet today per surgery - d/c IV fluids as taking adequate PO. - restart Xarelto and d/c heparin today 10/12 - zofram IV PRN nausea - Metamucil per surgery\  SOB- Has SOB at night and cough, requiring pillows to prop himself up as well as cough. Concern or OSA, brother has OSA - Will touch base with PCP regarding thoughts for potential sleep study or home O2 - continue mucinex for cough  Paroxysmal atrial fibrillation/HTN: Stable - Restarted home nebivolol, ramipril, and dronedarone  , BPs continue in 170s/70s. Would give another day to normalize, f/u OP - restart Xarelto and d/c heparin today 10/12  BPH, controlled:  - Strict I/O - Restarted home Rapaflo yesterday at 4mg , consider increasing to 8mg  if necessary  Hypokalemia, resolved: K 3.9 today - Daily BMP while poor PO intake  Moisture associated skin damage : Wound care folowing - Clean dressing in place. - Consulted wound ostomy.  Knee pain: tenderness to palpation over left lateral knee, improved today. - Muscle rub - Ice pack  Inguinal Pain- On further assessment pain actulally  located over side, not in inguinal region  Stage III CKD: creat at/below historical baseline - Trend BMP  Esophageal stricture/dysmotility: Not thought to be contributing to presentation. Followed by Dr. Hilarie Ford, last EGD with dilation Nov 2015 otherwise noted to be normal. - No intervention planned.  FEN/GI: Soft diet, SLIV Prophylaxis: restart Xarelto and d/c heparin today.  Disposition: SNF, pt requested [facility]  Subjective:  Reports abd distension more so today, denies abd pain, nausea, is passing gas and had a BM this AM  Objective: Temp:  [97.5 F (36.4 C)-98.2 F (36.8 C)] 97.5 F (36.4 C) (10/12 0615) Pulse Rate:  [70-78] 78 (10/12 0615) Resp:  [16-18] 16 (10/12 0615) BP: (144-179)/(66-77) 179/77 mmHg (10/12 0615) SpO2:  [96 %-99 %] 98 % (10/12 0615) Physical Exam: General: Alert, sitting in bedside chair Cardiovascular: RRR, no LE edema Respiratory: CTAB, no crackles Abdomen: obese abdomen, distended, non tender, + BS.   Laboratory:  Recent Labs Lab 08/27/15 0413 08/28/15 0419 08/29/15 0520  WBC 8.8 10.3 12.6*  HGB 11.9* 11.7* 11.3*  HCT 35.5* 34.7* 33.4*  PLT 159 153 154    Recent Labs Lab 08/26/15 0811 08/27/15 0413 08/28/15 0419  NA 133* 137 137  K 3.2* 3.2* 3.9  CL 99* 102 103  CO2 26 27 26   BUN 7 7 6   CREATININE 1.12 1.21 1.05  CALCIUM 8.0* 8.3* 8.5*  GLUCOSE 112* 115* 115*    Imaging/Diagnostic Tests: 10/10 DG Abd: NG tube tip noted over the mid esophagus. No prominent bowel distention or free air.  10/8 DG Abd: Nasogastric  tube placement to the gastric antrum with resolution of small bowel distension.  10/6 DG Abd: View Increasing small bowel distention when compared with the prior exam consistent with a progressive partial small bowel obstruction. No free air is seen.  10/3 DG ABDOMEN ACUTE W/ 1V CHEST: Small bowel distention with air-fluid levels. Moderate amount of stool and air in the large bowel; findings suggest  ileus.  10/1 CT Abd Pelvis wo contrast: Gastric and proximal small bowel distention with air and contrast, nonspecific. No definite obstruction pattern. Negative for free air.    [name] Brigham MD, [place] Resident PGY-2 Pager [phone]

## 2015-08-30 ENCOUNTER — Ambulatory Visit (INDEPENDENT_AMBULATORY_CARE_PROVIDER_SITE_OTHER): Payer: Medicare Other | Admitting: *Deleted

## 2015-08-30 ENCOUNTER — Inpatient Hospital Stay (HOSPITAL_COMMUNITY): Payer: Medicare Other

## 2015-08-30 DIAGNOSIS — I495 Sick sinus syndrome: Secondary | ICD-10-CM

## 2015-08-30 MED ORDER — POLYETHYLENE GLYCOL 3350 17 G PO PACK: 17.0000 g | PACK | Freq: Every day | ORAL | Status: AC

## 2015-08-30 MED ORDER — GUAIFENESIN ER 600 MG PO TB12: 600.0000 mg | ORAL_TABLET | Freq: Two times a day (BID) | ORAL | Status: DC | PRN

## 2015-08-30 MED ORDER — PSYLLIUM 95 % PO PACK: 1.0000 | PACK | Freq: Every day | ORAL | Status: DC

## 2015-08-30 NOTE — Care Management Important Message (Signed)
Important Message  Patient Details  Name: [name] MRN: [id number] Date of Birth: [birth year]   Medicare Important Message Given:  Yes-fourth notification given    Nathen May 08/30/2015, 11:07 AM

## 2015-08-30 NOTE — Clinical Social Work Note (Signed)
CSW received call from patient's RN stating patient's daughter-in-law now present at bedside and will transport patient via personal vehicle. CSW cancelled [facility] (EMS). RN to provide family with patient's discharge packet.  Lubertha Sayres, [place] Clinical Social Work Department Orthopedics [phone]) and Surgical [phone])

## 2015-08-30 NOTE — Clinical Social Work Placement (Signed)
   CLINICAL SOCIAL WORK PLACEMENT  NOTE  Date:  08/30/2015  Patient Details  Name: [name] MRN: [id number] Date of Birth: 1920/02/12  Clinical Social Work is seeking post-discharge placement for this patient at the [place] level of care (*CSW will initial, date and re-position this form in  chart as items are completed):  Yes   Patient/family provided with [place] Work Department's list of facilities offering this level of care within the geographic area requested by the patient (or if unable, by the patient's family).  Yes   Patient/family informed of their freedom to choose among providers that offer the needed level of care, that participate in Medicare, Medicaid or managed care program needed by the patient, have an available bed and are willing to accept the patient.  Yes   Patient/family informed of [facility]'s ownership interest in [facility] and [facility], as well as of the fact that they are under no obligation to receive care at these facilities.  PASRR submitted to EDS on  (n/a)     PASRR number received on  (n/a)     Existing PASRR number confirmed on 08/22/15     FL2 transmitted to all facilities in geographic area requested by pt/family on 08/22/15     FL2 transmitted to all facilities within larger geographic area on  (n/a)     Patient informed that his/her managed care company has contracts with or will negotiate with certain facilities, including the following:   (yes, [facility])     Yes   Patient/family informed of bed offers received.  Patient chooses bed at [facility] [place]     Physician recommends and patient chooses bed at  (n/a)    Patient to be transferred to [facility] on 08/30/15.  Patient to be transferred to facility by [facility]     Patient family notified on 08/30/15 of transfer.  Name of family member notified:  Patient updated at bedside.     PHYSICIAN        Additional Comment:    _______________________________________________ Caroline Sauger, LCSW 08/30/2015, 2:01 PM [phone]

## 2015-08-30 NOTE — Progress Notes (Signed)
Occupational Therapy Treatment Patient Details Name: [name] MRN: [id number] DOB: 1920/03/04 Today's Date: 08/30/2015    History of present illness [name] is a 81 y.o. male presenting with nausea and vomiting with radiographic evidence of ileus. PMH is significant for history of ilues, appendectomy, esophageal stricture, diverticulosis, DNR, AFib on xarelto with PPM, stage III CKD, and BPH.   OT comments  Focus of session included toilet transfer, grooming and functional mobility to increase pt's independence with ADLs. Pt demonstrating good progress towards goals. Pt will continue to benefit from skilled OT to further improve his independence and safety with ADLs.  Follow Up Recommendations  SNF    Equipment Recommendations  Other (comment) (defer to next venue)    Recommendations for Other Services      Precautions / Restrictions Precautions Precautions: Fall Restrictions Weight Bearing Restrictions: No       Mobility Bed Mobility               General bed mobility comments: Pt in chair upon arrival  Transfers Overall transfer level: Needs assistance Equipment used: Rolling walker (2 wheeled) Transfers: Sit to/from Stand Sit to Stand: Min guard         General transfer comment: Min guard for safety, pt requires increase time to power up from chair    Balance Overall balance assessment: Needs assistance Sitting-balance support: No upper extremity supported;Feet supported Sitting balance-Leahy Scale: Good     Standing balance support: No upper extremity supported;During functional activity Standing balance-Leahy Scale: Fair Standing balance comment: Pt able to stand at sink and performing grooming task without UE support                   ADL Overall ADL's : Needs assistance/impaired     Grooming: Wash/dry hands;Oral care;Supervision/safety;Standing                   Toilet Transfer: Set up;Ambulation;RW;BSC (BSC over  toilet)   Toileting- Clothing Manipulation and Hygiene: Min guard;Sit to/from stand       Functional mobility during ADLs: Min guard;Rolling walker General ADL Comments: Pt completed toilet transfer with 3-in-1 over toilet and performed grooming activities while standing at sink. Pt unable to don/doff sock, reports he could doff Lt LE sock prior to admission.      Vision                     Perception     Praxis      Cognition   Behavior During Therapy: WFL for tasks assessed/performed Overall Cognitive Status: Within Functional Limits for tasks assessed                       Extremity/Trunk Assessment               Exercises     Shoulder Instructions       General Comments      Pertinent Vitals/ Pain       Pain Assessment: No/denies pain  Home Living                                          Prior Functioning/Environment              Frequency Min 2X/week     Progress Toward Goals  OT Goals(current goals can now be found in  the care plan section)  Progress towards OT goals: Progressing toward goals  Acute Rehab OT Goals Patient Stated Goal: none stated OT Goal Formulation: With patient/family Time For Goal Achievement: 08/28/15 Potential to Achieve Goals: Good ADL Goals Pt Will Perform Grooming: with modified independence;standing Pt Will Perform Upper Body Bathing: with modified independence;sitting Pt Will Perform Lower Body Bathing: with modified independence;with adaptive equipment;sit to/from stand Pt Will Perform Upper Body Dressing: with modified independence;sitting Pt Will Perform Lower Body Dressing: with modified independence;with adaptive equipment;sit to/from stand Pt Will Transfer to Toilet: with modified independence;ambulating Pt Will Perform Toileting - Clothing Manipulation and hygiene: with modified independence;sitting/lateral leans;sit to/from stand  Plan Discharge plan remains appropriate     Co-evaluation                 End of Session Equipment Utilized During Treatment: Gait belt;Rolling walker   Activity Tolerance Patient tolerated treatment well   Patient Left in chair;with call bell/phone within reach;with family/visitor present   Nurse Communication          Time: 0375-4360 OT Time Calculation (min): 26 min  Charges: OT Treatments $Self Care/Home Management : 23-37 mins  Lin Landsman 08/30/2015, 2:10 PM

## 2015-08-30 NOTE — Progress Notes (Deleted)
Report called to Kelly at [facility]. 

## 2015-08-30 NOTE — Progress Notes (Signed)
Report called to Kelly at [facility]. 

## 2015-08-30 NOTE — Discharge Planning (Signed)
Patient to be discharged to [facility]. Patient updated at bedside.  Facility: [facility] RN report number: [phone] Transportation: EMS  Lubertha Sayres, [place] [phone]) and Surgical [phone])

## 2015-08-30 NOTE — Discharge Instructions (Signed)
You were admitted to the Family Medicine Inpatient Service at [facility] for your nausea, vomiting, and dehydration. You were evaluated for partial bowel obstruction which we treated with a nasogastric tube, bowel rest, and a bowel regimen including Miralax, Metamucil, and enema. We treated your dehydration with intravenous fluids. You may continue the Miralax and Metamucil when you leave the hospital for continued abdominal distension.  You were also concerned about your urinary retention from Benign Prostatic Hypertrophy. This may have gotten worse when you were unable to take your medications because of nausea and vomiting. We evaluated your bladder with a scan that showed that you are urinating enough to prevent any damage to the bladder. Please follow up with your primary care provider for further management of your Benign Prostatic Hypertrophy, or difficulty urinating, as well as your urinary incontinence.  If you experience any of the following symptoms, please seek urgent medical attention at the [facility]: 1. persistent vomiting and unable to keep fluids down 2. abdominal pain without passing gas 3. lack of urination If they cannot see you the same day, please seek immediate medical attention at your nearest Emergency Department.   Dehydration Dehydration means your body does not have as much fluid as it needs. Your kidneys, brain, and heart will not work properly without the right amount of fluids and salt. Older adults are more likely to become dehydrated than younger adults. This is because:   Their bodies do not hold water as well.  Their bodies do not respond to temperature changes as well.  They do not get thirsty as easily or as quickly. HOME CARE  Ask your doctor how to replace body fluid losses (rehydrate).  Drink enough fluids to keep your pee (urine) clear or pale yellow.  Drink small amounts of fluids often if you feel sick to your  stomach (nauseous) or throw up (vomit).  Eat like you normally do.  Avoid:  Foods or drinks high in sugar.  Bubbly (carbonated) drinks.  Juice.  Very hot or cold fluids.  Drinks with caffeine.  Fatty, greasy foods.  Alcohol.  Tobacco.  Eating too much.  Gelatin desserts.  Wash your hands to avoid spreading germs (bacteria, viruses).  Only take medicine as told by your doctor.  Keep all doctor visits as told. GET HELP IF:  You have belly (abdominal) pain that gets worse or stays in one spot (localizes).  You have a rash, stiff neck, or bad headache.  You get easily annoyed, sleepy, or are hard to wake up.  You feel weak, dizzy, or very thirsty.  You have a fever. GET HELP RIGHT AWAY IF:   You cannot drink fluid without throwing up.  You get worse even with treatment.  You throw up often.  You have watery poop (diarrhea) often.  Your vomit has blood in it or looks greenish.  Your poop (stool) has blood in it or looks black and tarry.  You have not peed in 6 to 8 hours or have only peed a small amount of very dark pee.  You pass out (faint). MAKE SURE YOU:   Understand these instructions.  Will watch your condition.  Will get help right away if you are not doing well or get worse.   This information is not intended to replace advice given to you by your health care provider. Make sure you discuss any questions you have with your health care provider.   Document Released: 10/23/2011 Document Revised:  11/08/2013 Document Reviewed: 07/11/2013 Elsevier Interactive Patient Education 2016 [place].   Rehydration Rehydration is the replacement of body fluids lost during dehydration. Dehydration is an extreme loss of body fluids to the point of body function impairment. There are many ways extreme fluid loss can occur, including vomiting, diarrhea, or excess sweating. Recovering from dehydration requires replacing lost fluids, continuing to eat to  maintain strength, and avoiding foods and beverages that may contribute to further fluid loss or may increase nausea. It is especially important for older adults to stay hydrated because dehydration can lead to dizziness and falls. Some factors that can contribute to dehydration are more common in the elderly, including the use of prescription medicines and a decreased sensation of thirst.  HOW TO REHYDRATE In most cases, rehydration involves the replacement of not only fluids but also carbohydrates and basic body salts. Rehydration with an oral rehydration solution is one way to replace essential nutrients lost through dehydration. An oral rehydration solution can be purchased at pharmacies, retail stores, and online. Premixed packets of powder that you combine with water to make a solution are also sold. You can prepare an oral rehydration solution at home by mixing the following ingredients together:    - tsp table salt.   tsp baking soda.   tsp salt substitute containing potassium chloride.  1 tablespoons sugar.  1 L (34 oz) of water. Be sure to use exact measurements. Including too much sugar can make diarrhea worse. Drink -1 cup (120-240 mL) of oral rehydration solution each time you have diarrhea or vomit. If drinking this amount makes your vomiting worse, try drinking smaller amounts more often. For example, drink 1-3 tsp every 5-10 minutes.  A general rule for staying hydrated is to drink 1-2 L of fluid per day. Talk to your caregiver about the specific amount you should be drinking each day. Drink enough fluids to keep your urine clear or pale yellow. EATING WHEN DEHYDRATED  Even if you have had severe sweating or you are having diarrhea, do not stop eating. Many healthy items in a normal diet are okay to continue eating while recovering from dehydration. The following tips can help you to lessen nausea when you eat:  Ask someone else to prepare your food. Cooking smells may worsen  nausea.  Eat in a well-ventilated room away from cooking smells.  Sit up when you eat. Avoid lying down until 1-2 hours after eating.  Eat small amounts when you eat.  Eat foods that are easy to digest. These include soft, well-cooked, or mashed foods. FOODS AND BEVERAGES TO AVOID Avoid eating or drinking the following foods and beverages that may increase nausea or further loss of fluid:   Fruit juices with a high sugar content, such as concentrated juices.  Alcohol.  Beverages containing caffeine.  Carbonated drinks. They may cause a lot of gas.  Foods that may cause a lot of gas, such as cabbage, broccoli, and beans.  Fatty, greasy, and fried foods.  Spicy, very salty, and very sweet foods or drinks.  Foods or drinks that are very hot or very cold. Consume food or drinks at or near room temperature.  Foods that need a lot of chewing, such as raw vegetables.  Foods that are sticky or hard to swallow, such as peanut butter.   This information is not intended to replace advice given to you by your health care provider. Make sure you discuss any questions you have with your health care provider.  Document Released: 01/26/2012 Document Revised: 07/28/2012 Document Reviewed: 01/26/2012 Elsevier Interactive Patient Education 2016 Elsevier Inc.   Nausea and Vomiting Nausea means you feel sick to your stomach. Throwing up (vomiting) is a reflex where stomach contents come out of your mouth. HOME CARE   Take medicine as told by your doctor.  Do not force yourself to eat. However, you do need to drink fluids.  If you feel like eating, eat a normal diet as told by your doctor.  Eat rice, wheat, potatoes, bread, lean meats, yogurt, fruits, and vegetables.  Avoid high-fat foods.  Drink enough fluids to keep your pee (urine) clear or pale yellow.  Ask your doctor how to replace body fluid losses (rehydrate). Signs of body fluid loss (dehydration) include:  Feeling very  thirsty.  Dry lips and mouth.  Feeling dizzy.  Dark pee.  Peeing less than normal.  Feeling confused.  Fast breathing or heart rate. GET HELP RIGHT AWAY IF:   You have blood in your throw up.  You have black or bloody poop (stool).  You have a bad headache or stiff neck.  You feel confused.  You have bad belly (abdominal) pain.  You have chest pain or trouble breathing.  You do not pee at least once every 8 hours.  You have cold, clammy skin.  You keep throwing up after 24 to 48 hours.  You have a fever. MAKE SURE YOU:   Understand these instructions.  Will watch your condition.  Will get help right away if you are not doing well or get worse.   This information is not intended to replace advice given to you by your health care provider. Make sure you discuss any questions you have with your health care provider.   Document Released: 04/21/2008 Document Revised: 01/26/2012 Document Reviewed: 04/04/2011 Elsevier Interactive Patient Education 2016 [place].   Follow-up Information    Follow up with Nobie Putnam, DO On 09/06/2015.   Specialty:  Osteopathic Medicine   Why:  1:45   Contact information:   [place] [address] [phone]       Follow up with Erlene Quan, PA-C On 09/18/2015.   Specialties:  Cardiology, Radiology   Why:  2:30pm   Contact information:   [address] [place] [address] [phone]

## 2015-08-30 NOTE — Progress Notes (Signed)
. Family Medicine Teaching Service Daily Progress Note Intern Pager: [phone]  Patient name: [name] Medical record number: [id number] Date of birth: 19-Sep-1920 Age: 81 y.o. Gender: male  Primary Care Provider: [name], MD Consultants: None Code Status: DNR  Pt Overview and Major Events to Date:  10/1: CT Abdomen Pelvis 10/3: DG Abd Acute w/Chest 10/6: DG Abd 1 View 10/7: NG tube placed 10/8: xray shows resolution of SBO  Assessment and Plan: [name] is a 81 y.o. male presenting with nausea and vomiting with radiographic evidence of ileus and partial SBO, now clinically improved. PMH is significant for history of ileus, appendectomy, esophageal stricture, diverticulosis, DNR, AFib on xarelto with PPM, stage III CKD, and BPH.  Ileus, resolving - s/p NG tube removal 10/11 - continue soft diet today - zofram IV PRN nausea - Metamucil qday, miralax BID qday  Productive cough and upper respiratory congestion, requiring pillows to prop himself up as well as cough. Concern for pneumonia vs OSA or CHF, brother has OSA. - CXR today for r/o pneumonia. Afebrile, WBC 10.3-->12.6-->10.7 - Will touch base with PCP regarding thoughts for potential sleep study or echo - continue mucinex for cough  Paroxysmal atrial fibrillation/HTN: Stable - Restarted home nebivolol, ramipril, and dronedarone , BPs continue in 170s/70s. Added prn hydralazine for >170/70 as inpatient, f/u OP - continue home Xarelto  BPH, controlled:  - Strict I/O - Restarted home Rapaflo yesterday at 4mg , consider increasing to 8mg  if necessary  Hypokalemia, resolved: K 3.9 today - Daily BMP while poor PO intake  Moisture associated skin damage : Wound care folowing - Clean dressing in place. - Consulted wound ostomy.  Knee pain, resolved. - Muscle rub - Ice pack  Inguinal Pain. On further assessment pain actually located over side, not in inguinal region. Potentially muscle/ligament strain from  abdominal distension vs. Prolonged bedrest. - Muscle rub  Stage III CKD: creat at/below historical baseline - Trend BMP  Esophageal stricture/dysmotility: Not thought to be contributing to presentation. Followed by Dr. Hilarie Fredrickson, last EGD with dilation Nov 2015 otherwise noted to be normal. - No intervention planned.  FEN/GI: Soft diet, SLIV Prophylaxis: Xarelto  Disposition: SNF, pt requested [facility]  Subjective:  Reports abd distension improved today. Reports BM x2 overnight, and states that they were large and that he could not make it to the bathroom in time. Denies abd pain, nausea, is passing gas.  Objective: Temp:  [97.4 F (36.3 C)-99 F (37.2 C)] 99 F (37.2 C) (10/13 0535) Pulse Rate:  [70-79] 76 (10/13 0535) Resp:  [16-17] 16 (10/13 0535) BP: (144-171)/(61-84) 158/61 mmHg (10/13 0535) SpO2:  [96 %-98 %] 96 % (10/13 0535) Physical Exam: General: Alert, sitting in bedside chair eating breakfast. Cardiovascular: RRR, no LE edema Respiratory: CTAB, no crackles Abdomen: obese abdomen, distended, non tender, + BS.  Laboratory:  Recent Labs Lab 08/28/15 0419 08/29/15 0520 08/29/15 1630  WBC 10.3 12.6* 10.7*  HGB 11.7* 11.3* 11.1*  HCT 34.7* 33.4* 32.8*  PLT 153 154 142*    Recent Labs Lab 08/26/15 0811 08/27/15 0413 08/28/15 0419  NA 133* 137 137  K 3.2* 3.2* 3.9  CL 99* 102 103  CO2 26 27 26   BUN 7 7 6   CREATININE 1.12 1.21 1.05  CALCIUM 8.0* 8.3* 8.5*  GLUCOSE 112* 115* 115*    Imaging/Diagnostic Tests: 10/10 DG Abd: NG tube tip noted over the mid esophagus. No prominent bowel distention or free air.  10/8 DG Abd: Nasogastric  tube placement to the gastric antrum with resolution of small bowel distension.  10/6 DG Abd: View Increasing small bowel distention when compared with the prior exam consistent with a progressive partial small bowel obstruction. No free air is seen.  10/3 DG ABDOMEN ACUTE W/ 1V CHEST: Small bowel distention  with air-fluid levels. Moderate amount of stool and air in the large bowel; findings suggest ileus.  10/1 CT Abd Pelvis wo contrast: Gastric and proximal small bowel distention with air and contrast, nonspecific. No definite obstruction pattern. Negative for free air.  [name], MS4   Resident Addendum [name] is a 81 y.o. male presenting with nausea and vomiting with radiographic evidence of ileus and partial SBO, now clinically improved. PMH is significant for history of ileus, appendectomy, esophageal stricture, diverticulosis, DNR, AFib on xarelto with PPM, stage III CKD, and BPH.  S: Pt reports abd felt better, had two BM yesterday, continues to pass gas. Denies chest pain, SOB but does reports chect congestion  BP 158/61 mmHg  Pulse 76  Temp(Src) 99 F (37.2 C) (Oral)  Resp 16  Ht 5\' 8"  (1.727 m)  Wt 198 lb 1.6 oz (89.858 kg)  BMI 30.13 kg/m2  SpO2 96%  Exam Gen: NAD, sitting in bedside chair CV: RRR Pulm: CTAB Abd: distended, non tender, + BS  Ileus, resolving - s/p NG tube removal 10/11 - continue soft diet today - zofram IV PRN nausea - Metamucil qday, miralax qD qday  Productive cough and upper respiratory congestion- CXR neg for acute process, per PCP this has been a chronic issue - continue mucinex for cough  Paroxysmal atrial fibrillation/HTN: Stable - continue nebivolol, ramipril, and dronedarone - hydral IV PRN - continue home Xarelto  BPH, controlled:  - Strict I/O - continue rapaflo  Hypokalemia, resolved:  - trend Bis taking less PO  Moisture associated skin damage : Wound care folowing - Clean dressing in place. - Wound care following  Knee pain, resolved. - Muscle rub - Ice pack  Inguinal Pain. On further assessment pain actually located over side, not in inguinal region. Potentially muscle/ligament strain from abdominal distension vs. Prolonged bedrest. - Muscle rub  Stage III CKD: creat at/below historical baseline -  Trend BMP  Esophageal stricture/dysmotility: Not thought to be contributing to presentation. Followed by Dr. Hilarie Fredrickson, last EGD with dilation Nov 2015 otherwise noted to be normal. - No intervention planned.  FEN/GI: Soft diet, SLIV Prophylaxis: Xarelto  Dispo: SNF   Lauraine Crespo A. Lincoln Brigham MD, [place] [facility] Resident PGY-2 Pager [phone]

## 2015-08-30 NOTE — Progress Notes (Signed)
Remote pacemaker transmission.   

## 2015-09-06 ENCOUNTER — Ambulatory Visit: Payer: Medicare Other | Admitting: Family Medicine

## 2015-09-18 ENCOUNTER — Encounter: Payer: Self-pay | Admitting: Cardiology

## 2015-09-18 ENCOUNTER — Ambulatory Visit (INDEPENDENT_AMBULATORY_CARE_PROVIDER_SITE_OTHER): Payer: Medicare Other | Admitting: Cardiology

## 2015-09-18 VITALS — BP 130/70 | HR 88 | Ht 66.0 in | Wt 182.9 lb

## 2015-09-18 DIAGNOSIS — I10 Essential (primary) hypertension: Secondary | ICD-10-CM

## 2015-09-18 DIAGNOSIS — K56609 Unspecified intestinal obstruction, unspecified as to partial versus complete obstruction: Secondary | ICD-10-CM

## 2015-09-18 DIAGNOSIS — K5669 Other intestinal obstruction: Secondary | ICD-10-CM | POA: Diagnosis not present

## 2015-09-18 DIAGNOSIS — Z950 Presence of cardiac pacemaker: Secondary | ICD-10-CM | POA: Diagnosis not present

## 2015-09-18 DIAGNOSIS — I480 Paroxysmal atrial fibrillation: Secondary | ICD-10-CM

## 2015-09-18 DIAGNOSIS — Z7901 Long term (current) use of anticoagulants: Secondary | ICD-10-CM | POA: Diagnosis not present

## 2015-09-18 NOTE — Progress Notes (Signed)
09/18/2015 [name]   1920-07-17  [id number]  Primary Physician [name], MD Primary Cardiologist: Dr [name]  HPI:  81 y/o male who has a long standing history of sinus node dysfunction (initial pacemaker insertion 2006), subsequently developed high grade AV block and a history of persistent atrial fibrillation which has responded well to dronedarone. He required a cardioversion in 2013. He is on Xarelto for embolic prevention, no bleeding complications. He has well controlled HTN and mild, diet controlled type 2 DM. He was admitted for a SBO 08/20/15-/08/30/15 and treated conservatively.  He is in the office today for follow up. He is doing well tolerating po's well. He is recovering at [facility] but plans to be discharged home soon. They added Lasix 20 mg daily at [facility], presumably for LE edema.    Current Outpatient Prescriptions  Medication Sig Dispense Refill  . beta carotene w/minerals (OCUVITE) tablet Take 1 tablet by mouth every morning.     . furosemide (LASIX) 20 MG tablet Take 20 mg by mouth.    . MULTAQ 400 MG tablet TAKE 1 TABLET TWICE A DAY  WITH A MEAL 180 tablet 0  . nebivolol (BYSTOLIC) 10 MG tablet Take 1 tablet (10 mg total) by mouth every morning. 90 tablet 3  . OVER THE COUNTER MEDICATION Place 1 each into both ears as needed (for allergies.). Ear drops.    . pantoprazole (PROTONIX) 40 MG tablet Take 1 tablet (40 mg total) by mouth daily before breakfast. 90 tablet 2  . polyethylene glycol (MIRALAX / GLYCOLAX) packet Take 17 g by mouth daily. 14 each 0  . Probiotic Product (PHILLIPS COLON HEALTH PO) Take 1 tablet by mouth every morning.     . psyllium (HYDROCIL/METAMUCIL) 95 % PACK Take 1 packet by mouth daily. 56 each 2  . ramipril (ALTACE) 2.5 MG capsule Take 1 capsule (2.5 mg total) by mouth every morning. 90 capsule 3  . Rivaroxaban (XARELTO) 15 MG TABS tablet Take 1 tablet (15 mg total) by mouth at bedtime. 90 tablet 3  . silodosin (RAPAFLO) 4 MG CAPS  capsule Take 1 capsule (4 mg total) by mouth daily with breakfast. 90 capsule 3  . traMADol (ULTRAM) 50 MG tablet Take 1 tablet (50 mg total) by mouth every 8 (eight) hours as needed for moderate pain. 90 tablet 3   No current facility-administered medications for this visit.    Allergies  Allergen Reactions  . Codeine     REACTION: nausea    Social History   Social History  . Marital Status: Widowed    Spouse Name: N/A  . Number of Children: N/A  . Years of Education: N/A   Occupational History  . retired    Social History Main Topics  . Smoking status: Former Smoker -- 1.00 packs/day for 20 years    Types: Cigarettes    Quit date: 11/17/1969  . Smokeless tobacco: Never Used  . Alcohol Use: No     Comment: none since 1990  . Drug Use: No  . Sexual Activity: Not on file   Other Topics Concern  . Not on file   Social History Narrative   He is going to [facility] in [place].     Review of Systems: General: negative for chills, fever, night sweats or weight changes.  Cardiovascular: negative for chest pain, dyspnea on exertion, edema, orthopnea, palpitations, paroxysmal nocturnal dyspnea or shortness of breath Dermatological: negative for rash Respiratory: negative for cough or  wheezing Urologic: negative for hematuria Abdominal: negative for nausea, vomiting, diarrhea, bright red blood per rectum, melena, or hematemesis Neurologic: negative for visual changes, syncope, or dizziness All other systems reviewed and are otherwise negative except as noted above.    Blood pressure 130/70, pulse 88, height 5\' 6"  (1.676 m), weight 182 lb 14.4 oz (82.963 kg).  General appearance: alert, cooperative, no distress and mildly obese Lungs: decreased but no rales Heart: regular rate and rhythm Extremities: trace edema Neurologic: Grossly normal   ASSESSMENT AND PLAN:   SBO (small bowel obstruction) ([name]) Recent admission for SBO. Doing well since  discharge.   Anticoagulant long-term use Back on Xarelto 15 mg  Essential hypertension Controlled  Pacemaker - Medtronic Adapta 2006, gen change 2015 .  PAF (paroxysmal atrial fibrillation) ([name]) DCCV 2013, NSR on Multaq   PLAN  Same Rx, keep appointment with Dr Recardo Evangelist as scheduled.   Kerin Ransom K PA-C 09/18/2015 3:30 PM

## 2015-09-18 NOTE — Patient Instructions (Signed)
Keep scheduled appointment with Dr. [name] in January.

## 2015-09-18 NOTE — Assessment & Plan Note (Signed)
Controlled.  

## 2015-09-18 NOTE — Assessment & Plan Note (Signed)
Recent admission for SBO. Doing well since discharge.

## 2015-09-18 NOTE — Assessment & Plan Note (Signed)
DCCV 2013, NSR on Multaq

## 2015-09-18 NOTE — Assessment & Plan Note (Signed)
Back on Xarelto 15 mg

## 2015-09-20 LAB — CUP PACEART REMOTE DEVICE CHECK
Battery Impedance: 136 Ohm
Battery Voltage: 2.79 V
Brady Statistic AP VS Percent: 0.1 % — CL
Brady Statistic AS VP Percent: 1 %
Brady Statistic AS VS Percent: 0.1 % — CL
Implantable Lead Implant Date: 20061128
Implantable Lead Implant Date: 20061128
Implantable Lead Location: 753860
Lead Channel Impedance Value: 607 Ohm
Lead Channel Pacing Threshold Amplitude: 0.75 V
Lead Channel Pacing Threshold Pulse Width: 0.4 ms
Lead Channel Setting Pacing Amplitude: 2 V
Lead Channel Setting Sensing Sensitivity: 4 mV
MDC IDC LEAD LOCATION: 753859
MDC IDC LEAD MODEL: 4592
MDC IDC MSMT LEADCHNL RA IMPEDANCE VALUE: 492 Ohm
MDC IDC MSMT LEADCHNL RA PACING THRESHOLD AMPLITUDE: 0.5 V
MDC IDC MSMT LEADCHNL RA PACING THRESHOLD PULSEWIDTH: 0.4 ms
MDC IDC SESS DTM: 20161103143850
MDC IDC SET LEADCHNL RV PACING AMPLITUDE: 2.5 V
MDC IDC SET LEADCHNL RV PACING PULSEWIDTH: 0.4 ms
MDC IDC STAT BRADY AP VP PERCENT: 99 %

## 2015-09-21 ENCOUNTER — Encounter: Payer: Self-pay | Admitting: Cardiology

## 2015-10-01 ENCOUNTER — Encounter: Payer: Self-pay | Admitting: *Deleted

## 2015-10-01 ENCOUNTER — Telehealth: Payer: Self-pay | Admitting: Cardiovascular Disease

## 2015-10-01 ENCOUNTER — Telehealth: Payer: Self-pay | Admitting: Family Medicine

## 2015-10-01 NOTE — Telephone Encounter (Signed)
No need for SBE prevention. Stop Xarelto 48 hours (2 evenings before extractions) and restart the night after extraction Thanks, Ovid Curd

## 2015-10-01 NOTE — Telephone Encounter (Signed)
Faxed clearance w/ instructions to number provided after notifying dentist office.

## 2015-10-01 NOTE — Telephone Encounter (Signed)
Non-urgent dental clearance request.  Pending extraction of 5 teeth. Pt on Xarelto for embolic prevention.  PM - Medtronic Adapta - last interrogation 09/20/15  No hx of valve prosthesis, etc.  Per protocol no SBE proph recommended - will route to Dr. [name] for confirmation & OK to hold Xarelto  -------------- For triage RN:  Dentist office:  phone [phone]  fax [phone]  [place] requests [place] to call before sending faxed materials as someone needs to be present to accept incoming faxes.

## 2015-10-01 NOTE — Telephone Encounter (Signed)
[facility] called and would like verbal orders to see the patient for PT. She will be seeing him 2 times a week for 4 weeks. jw

## 2015-10-01 NOTE — Telephone Encounter (Signed)
1. What dental office are you calling from? Dr. Lavella Hammock  2. What is your office phone and fax number? [phone] (telephone)  3. What type of procedure is the patient having performed? Tooth extraction  4. What date is procedure scheduled? Pending    5. What is your question (ex. Antibiotics prior to procedure, holding medication-we need to know how long dentist wants pt to hold med)? How long can he hold his Xarelto   6.

## 2015-10-02 NOTE — Telephone Encounter (Signed)
Called [name] and [name] with verbal orders for Mr. Matto to receive PT 2x a week for 4 weeks. Ottis Stain, CMA

## 2015-10-02 NOTE — Telephone Encounter (Signed)
Dear [name] Team Ok to give verbal orders [facility]! [name]

## 2015-10-03 ENCOUNTER — Inpatient Hospital Stay: Payer: Medicare Other | Admitting: Family Medicine

## 2015-10-03 ENCOUNTER — Encounter: Payer: Self-pay | Admitting: Family Medicine

## 2015-10-03 ENCOUNTER — Ambulatory Visit (INDEPENDENT_AMBULATORY_CARE_PROVIDER_SITE_OTHER): Payer: Medicare Other | Admitting: Family Medicine

## 2015-10-03 VITALS — BP 154/73 | HR 94 | Temp 97.8°F | Ht 66.0 in | Wt 184.1 lb

## 2015-10-03 DIAGNOSIS — D518 Other vitamin B12 deficiency anemias: Secondary | ICD-10-CM

## 2015-10-03 DIAGNOSIS — K222 Esophageal obstruction: Secondary | ICD-10-CM

## 2015-10-03 DIAGNOSIS — Z609 Problem related to social environment, unspecified: Secondary | ICD-10-CM

## 2015-10-03 DIAGNOSIS — Z23 Encounter for immunization: Secondary | ICD-10-CM | POA: Diagnosis not present

## 2015-10-03 DIAGNOSIS — E8770 Fluid overload, unspecified: Secondary | ICD-10-CM | POA: Diagnosis not present

## 2015-10-03 DIAGNOSIS — I10 Essential (primary) hypertension: Secondary | ICD-10-CM

## 2015-10-03 DIAGNOSIS — Z7289 Other problems related to lifestyle: Secondary | ICD-10-CM | POA: Diagnosis not present

## 2015-10-03 MED ORDER — CYANOCOBALAMIN 1000 MCG/ML IJ SOLN
1000.0000 ug | Freq: Once | INTRAMUSCULAR | Status: AC
  Administered 2015-10-03: 1000 ug via INTRAMUSCULAR

## 2015-10-03 MED ORDER — TORSEMIDE 10 MG PO TABS: ORAL_TABLET | ORAL | Status: DC

## 2015-10-03 MED ORDER — PANTOPRAZOLE SODIUM 40 MG PO TBEC: 40.0000 mg | DELAYED_RELEASE_TABLET | Freq: Every day | ORAL | Status: DC

## 2015-10-03 NOTE — Patient Instructions (Signed)
As long as things are going well, see Dr. Nori Riis in 2 months.  Earlier if any problems Cut back the fluid pill to one pill twice a week.   You will get a flu and B12 shot today. I sent in two refills. Torsemide and pantoprazole.

## 2015-10-03 NOTE — Assessment & Plan Note (Signed)
Refill ppi

## 2015-10-04 DIAGNOSIS — Z609 Problem related to social environment, unspecified: Secondary | ICD-10-CM | POA: Insufficient documentation

## 2015-10-04 NOTE — Assessment & Plan Note (Signed)
Decent control.  Avoid overtreatment.  Wean torsemide.

## 2015-10-04 NOTE — Assessment & Plan Note (Signed)
Due for monthly shot.

## 2015-10-04 NOTE — Progress Notes (Signed)
   Subjective:    Patient ID: [name], male    DOB: [birth year], 81 y.o.   MRN: [id number]  HPI [name] was hospitalized ~1 month ago with ileus and dehydration.  He was discharge to the nursing home to regain strength.  While in the nursing home was placed on first lasix for fluid retention and then switch to torsemide because of a rash temporally related to the lasix.    He is now home also, but checked on daily by his children.  He feels well and has no complaints other than the lingering rash on his shoulders and back.  Using aveelo lotion for the rash.  No ankle swelling.  No SOB.   He is due for a B12 shot.  Also needs a flu shot     Review of Systems     Objective:   Physical Exam VS noted.  Wt is stable over the past two weeks. Lungs clear Cardiac RRR without m or g Abd benign Ext no edema.        Assessment & Plan:

## 2015-10-04 NOTE — Assessment & Plan Note (Signed)
Family visits daily.  Patient is aware of risks and wants to continue his independent living.

## 2015-10-04 NOTE — Assessment & Plan Note (Signed)
Volume overload etiology is unclear to me.  Does not carry dx of CHF.  Does carry CKD dx.  Regardless, he was not on a diuretic prior to hospitalization and is not volume overloaded.  Only taking torsemide 3 x per week.  Will cut back to twice a week and plan further reductions if stays euvolemic.

## 2015-10-29 ENCOUNTER — Encounter: Payer: Self-pay | Admitting: Internal Medicine

## 2015-11-05 IMAGING — CT CT ABD-PELV W/O CM
2 of 4 series · 15 of 46 positions shown, 17 images · non-contrast
Comparison: 08/31/2014, 01/05/2014

CLINICAL DATA: Acute epigastric abdominal pain for 2 days. Syncope.

EXAM:
CT ABDOMEN AND PELVIS WITHOUT CONTRAST
TECHNIQUE: Multidetector CT imaging of the abdomen and pelvis was performed
following the standard protocol without IV contrast.

[Series 2: a/p w/o 5mm · axial · non-contrast · 0.89mm/px · z∈[-544,-94]mm · 12 of 100 slices shown, 14 images]
[im 5/100  soft-tissue]
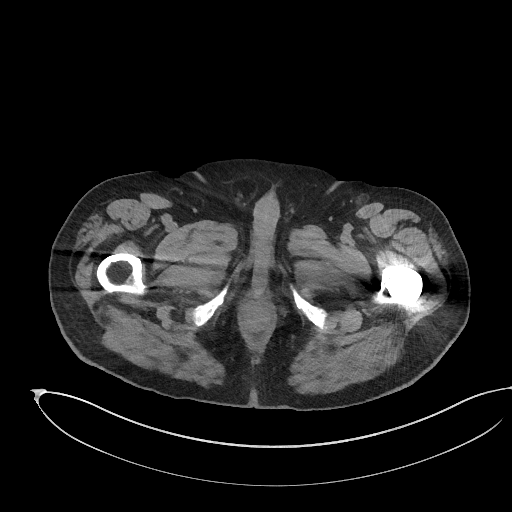
[im 5/100  bone]
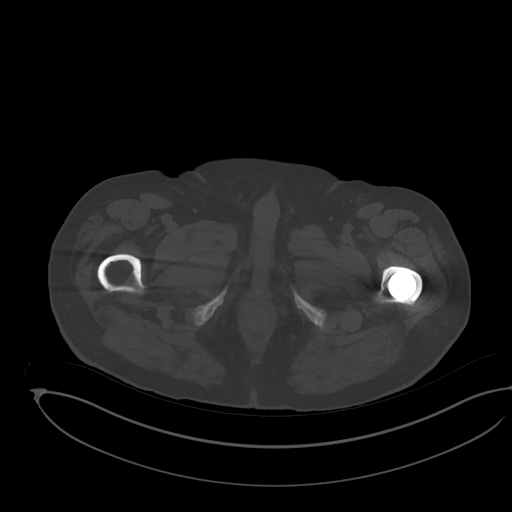
[im 14/100  soft-tissue]
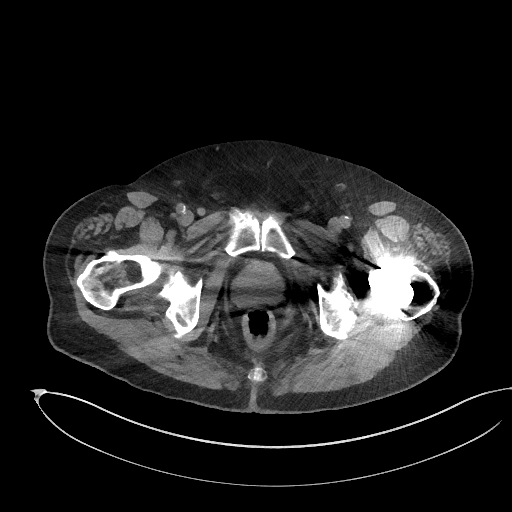
[im 23/100  soft-tissue]
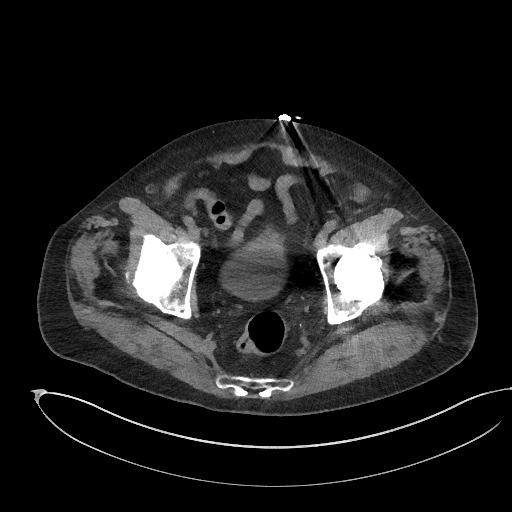
[im 32/100  soft-tissue]
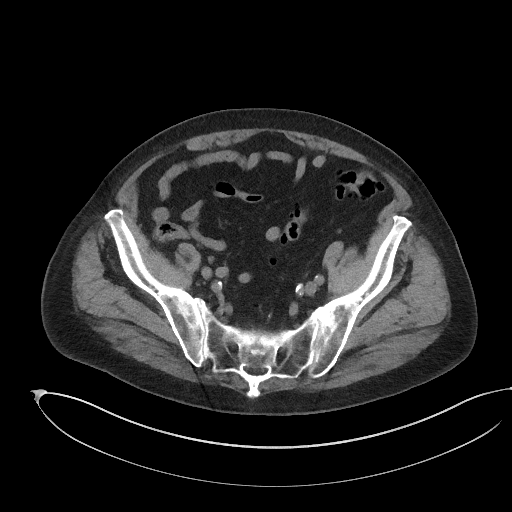
[im 37/100  soft-tissue]
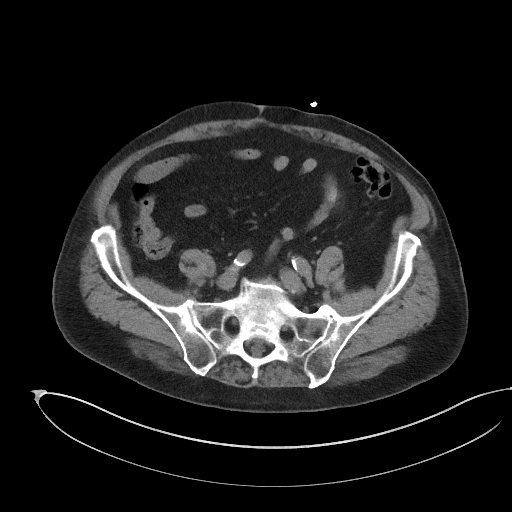
[im 46/100  soft-tissue]
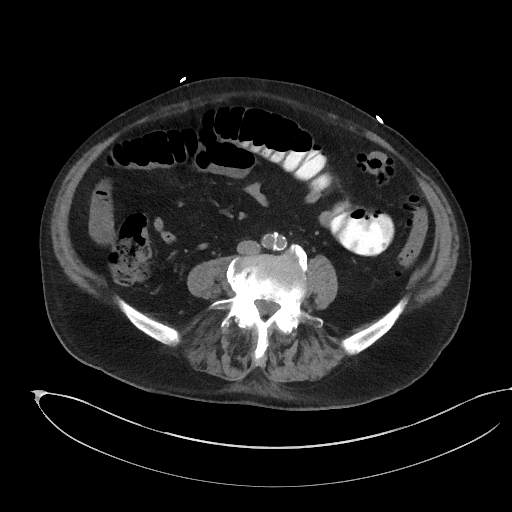
[im 55/100  soft-tissue]
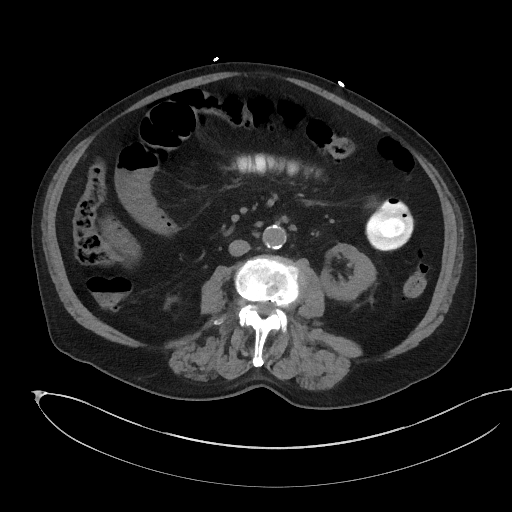
[im 64/100  soft-tissue]
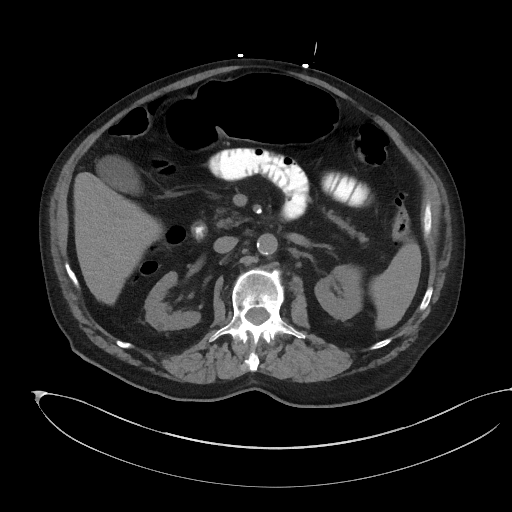
[im 68/100  soft-tissue]
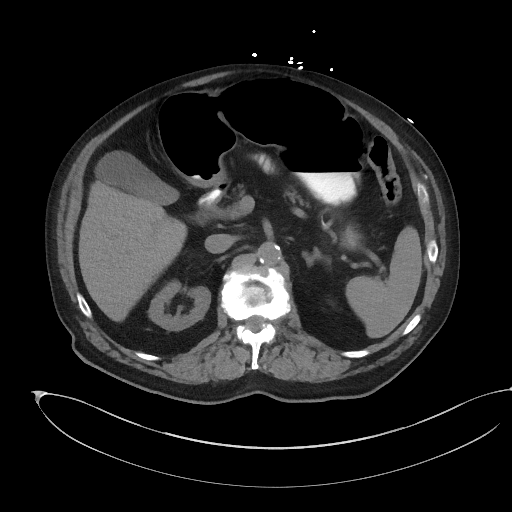
[im 68/100  bone]
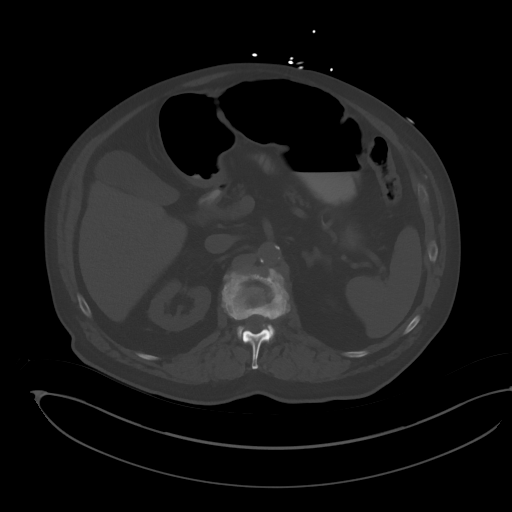
[im 77/100  soft-tissue]
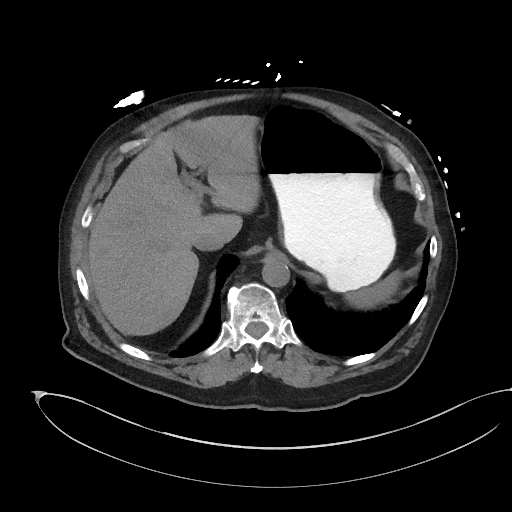
[im 86/100  soft-tissue]
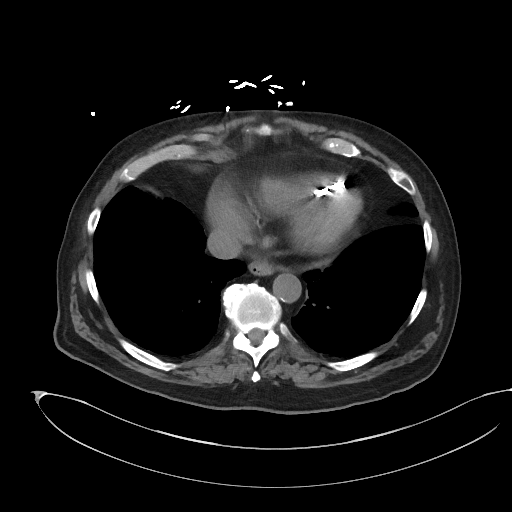
[im 95/100  soft-tissue]
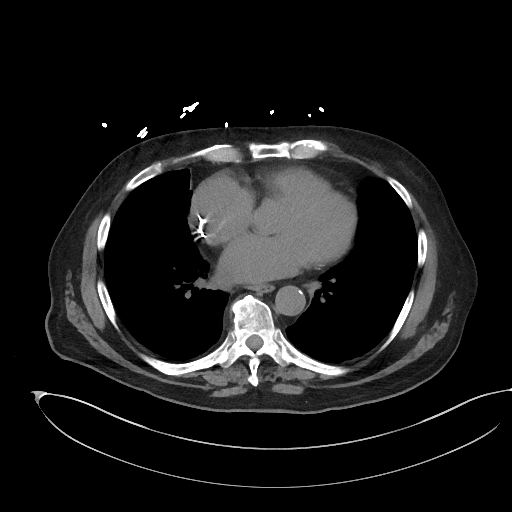

[Series 5: a/p w/o cor · coronal · non-contrast · 0.97mm/px · 3 of 151 slices shown]
[im 51/151  soft-tissue]
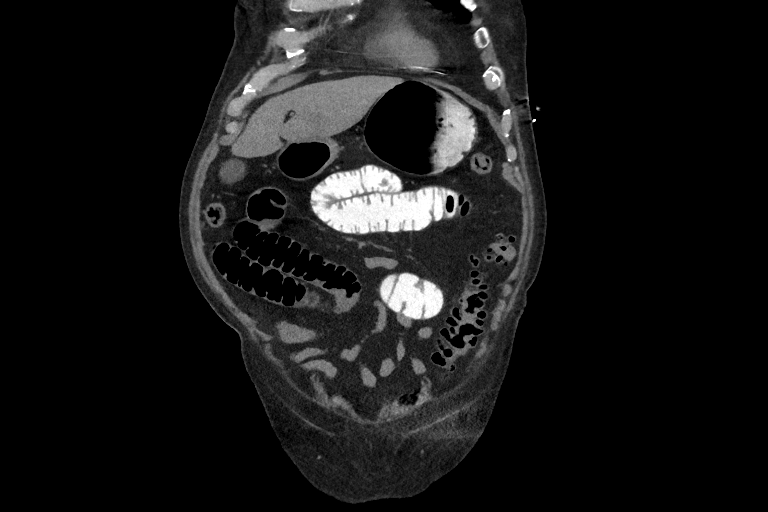
[im 67/151  soft-tissue]
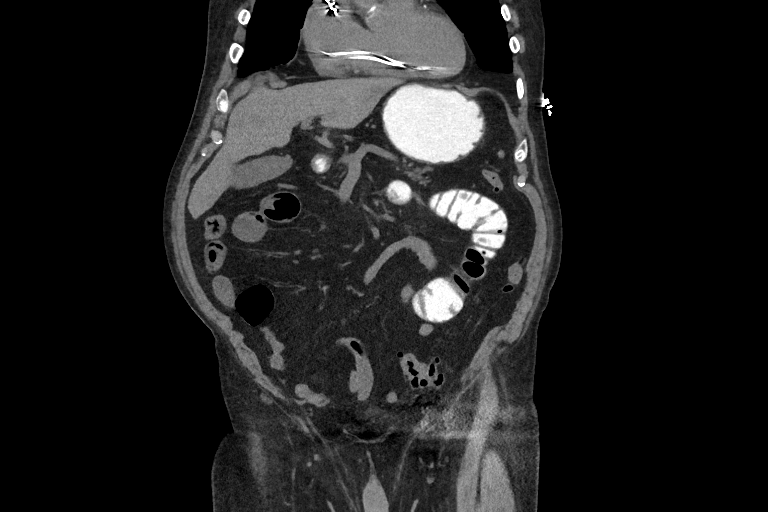
[im 84/151  soft-tissue]
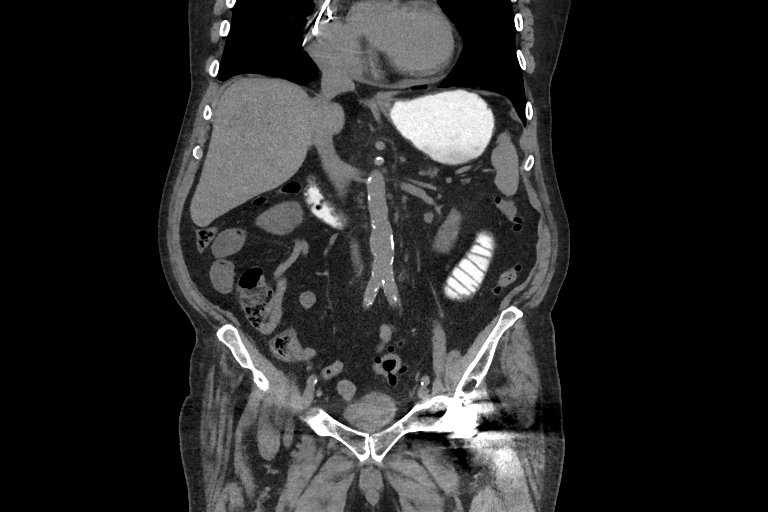

[15 of 46 positions shown; findings below may reference images not displayed]

FINDINGS: Lower chest: Pacemaker in the rest of the heart. Heart is enlarged.
Thoracic aortic atherosclerosis and coronary calcifications noted.
No pericardial or pleural effusion. No significant hiatal hernia.

Lower thoracic degenerative osteophytes on the right.

Minor basilar scarring.  Otherwise lung bases clear.

Abdomen: Liver, gallbladder, biliary system, atrophic fatty replaced
pancreas, and adrenal glands are within normal limits for age and
noncontrast imaging. Spleen demonstrates scattered calcifications
from granulomatous disease. Small accessory splenules in the left
upper quadrant.

Kidneys demonstrate no acute obstruction, hydronephrosis,
perinephric inflammatory process, or obstructing ureteral calculus
on either side.

Aortic atherosclerosis noted without aneurysm or retroperitoneal
hemorrhage.

Mild to moderate distention of the stomach and proximal small bowel
with contrast and air. Distal small bowel is collapsed however there
is air in the colon. Colonic diverticulosis noted. Appearance of the
bowel is nonspecific.

No abdominal free fluid, fluid collection, hemorrhage, abscess, or
adenopathy.

Pelvis: Diverticulosis of the descending colon and sigmoid. No acute
inflammatory process. No pelvic free fluid, fluid collection,
hemorrhage, abscess, adenopathy, inguinal abnormality, or hernia.
Urinary bladder unremarkable. No acute distal bowel process.

Left hip replacement noted creating artifact. Degenerative changes
of the right hip, pelvis and spine.
IMPRESSION: Gastric and proximal small bowel distention with air and contrast,
nonspecific. No definite obstruction pattern. Negative for free air.

No acute obstructing urinary tract or ureteral calculus. No
hydronephrosis or obstructive uropathy

Abdominal atherosclerosis

Colonic diverticulosis without acute inflammation

## 2015-11-08 ENCOUNTER — Encounter (HOSPITAL_COMMUNITY): Payer: Self-pay

## 2015-11-08 ENCOUNTER — Emergency Department (HOSPITAL_COMMUNITY)
Admission: EM | Admit: 2015-11-08 | Discharge: 2015-11-08 | Disposition: A | Discharge status: Home / Self Care | Payer: Medicare Other | Attending: Emergency Medicine | Admitting: Emergency Medicine

## 2015-11-08 DIAGNOSIS — Y9289 Other specified places as the place of occurrence of the external cause: Secondary | ICD-10-CM | POA: Insufficient documentation

## 2015-11-08 DIAGNOSIS — E119 Type 2 diabetes mellitus without complications: Secondary | ICD-10-CM | POA: Insufficient documentation

## 2015-11-08 DIAGNOSIS — Z950 Presence of cardiac pacemaker: Secondary | ICD-10-CM | POA: Insufficient documentation

## 2015-11-08 DIAGNOSIS — Z85828 Personal history of other malignant neoplasm of skin: Secondary | ICD-10-CM | POA: Insufficient documentation

## 2015-11-08 DIAGNOSIS — K219 Gastro-esophageal reflux disease without esophagitis: Secondary | ICD-10-CM | POA: Insufficient documentation

## 2015-11-08 DIAGNOSIS — Y9389 Activity, other specified: Secondary | ICD-10-CM | POA: Diagnosis not present

## 2015-11-08 DIAGNOSIS — I4891 Unspecified atrial fibrillation: Secondary | ICD-10-CM | POA: Insufficient documentation

## 2015-11-08 DIAGNOSIS — Z8739 Personal history of other diseases of the musculoskeletal system and connective tissue: Secondary | ICD-10-CM | POA: Insufficient documentation

## 2015-11-08 DIAGNOSIS — I129 Hypertensive chronic kidney disease with stage 1 through stage 4 chronic kidney disease, or unspecified chronic kidney disease: Secondary | ICD-10-CM | POA: Insufficient documentation

## 2015-11-08 DIAGNOSIS — K1379 Other lesions of oral mucosa: Secondary | ICD-10-CM

## 2015-11-08 DIAGNOSIS — Y998 Other external cause status: Secondary | ICD-10-CM | POA: Diagnosis not present

## 2015-11-08 DIAGNOSIS — N183 Chronic kidney disease, stage 3 (moderate): Secondary | ICD-10-CM | POA: Diagnosis not present

## 2015-11-08 DIAGNOSIS — Z7901 Long term (current) use of anticoagulants: Secondary | ICD-10-CM | POA: Insufficient documentation

## 2015-11-08 DIAGNOSIS — X58XXXA Exposure to other specified factors, initial encounter: Secondary | ICD-10-CM | POA: Diagnosis not present

## 2015-11-08 DIAGNOSIS — S0993XA Unspecified injury of face, initial encounter: Secondary | ICD-10-CM | POA: Diagnosis present

## 2015-11-08 DIAGNOSIS — Z79899 Other long term (current) drug therapy: Secondary | ICD-10-CM | POA: Insufficient documentation

## 2015-11-08 DIAGNOSIS — Z86010 Personal history of colonic polyps: Secondary | ICD-10-CM | POA: Diagnosis not present

## 2015-11-08 MED ORDER — SILVER NITRATE-POT NITRATE 75-25 % EX MISC
1.0000 | Freq: Once | CUTANEOUS | Status: AC
  Administered 2015-11-08: 1 via TOPICAL
  Filled 2015-11-08: qty 1

## 2015-11-08 MED ORDER — LIDOCAINE-EPINEPHRINE (PF) 2 %-1:200000 IJ SOLN
10.0000 mL | Freq: Once | INTRAMUSCULAR | Status: AC
  Administered 2015-11-08: 10 mL
  Filled 2015-11-08: qty 20

## 2015-11-08 NOTE — ED Notes (Addendum)
Pt arrives pov with c/o dental bleeding. Ptr states he had dental work done last Thursday with no issues. Was off Xaralto 4 days and restarted on Saturday. Bleeding started last night at 0300. Moderate amount of clots noted

## 2015-11-08 NOTE — ED Notes (Signed)
Pt waiting with packing in place. No bleeding noted at present

## 2015-11-08 NOTE — Discharge Instructions (Signed)
Apply tea bag or guaze dressing to the bleeding tooth and hold firm pressure for 10 min when bleeding. Hold xarelto for 2 days. Follow up with Dr. Gae Bon.

## 2015-11-08 NOTE — ED Notes (Signed)
Home via w/c with daughter in law. vewrrbalizes understanding of instructions.

## 2015-11-08 NOTE — ED Provider Notes (Signed)
CSN: [id number]     Arrival date & time 11/08/15  0825 History   First MD Initiated Contact with Patient 11/08/15 [phone]     Chief Complaint  Patient presents with  . Dental Injury     (Consider location/radiation/quality/duration/timing/severity/associated sxs/prior Treatment) HPI [name] is a 81 y.o. male with multiple medical problems, presents to emergency department complaining of bleeding from extraction site in his mouth. Patient states he had a couple of teeth extracted 1 week ago. Restarted xarelto 4 days ago. He has been wearing dentures. Patient states bleeding started yesterday at 7 PM. States was able to stop bleeding with pressure. States woke up at 3 AM with worse bleeding, clots, unable to stop with pressure. Patient states he tried pressure as well as teabags. He called his neighbor who brought him to emergency department this morning. Patient denies any pain. No other complaints.   Past Medical History  Diagnosis Date  . Overactive bladder   . Arthritis   . Diverticulosis of colon (without mention of hemorrhage)   . Gout   . Chronic kidney disease, stage III (moderate)   . Family history of malignant neoplasm of gastrointestinal tract   . Stricture and stenosis of esophagus   . Acute gastritis without mention of hemorrhage   . Unspecified cataract   . Hypertrophy of prostate with urinary obstruction and other lower urinary tract symptoms (LUTS)   . Personal history of colonic polyps 10/21/2006    hyperplastic   . Vitamin B12 deficiency   . Hiatal hernia   . Type II or unspecified type diabetes mellitus without mention of complication, not stated as uncontrolled     pt states that he checks his blood sugar once weekly, is not on oral meds or insulin  . Atrial fibrillation ([place])   . Sinus node dysfunction (HCC)   . Presence of permanent cardiac pacemaker 10/14/05    medtronic Adapta  . Hypertension   . Esophageal dysmotility   . Second degree AV block  11/29/2014  . Environmental allergies     HISTORY OF  . GERD (gastroesophageal reflux disease)   . History of skin cancer   . Small bowel obstruction [facility])    Past Surgical History  Procedure Laterality Date  . Appendectomy    . Tonsillectomy    . Pacemaker insertion  replaced 2 months ago, total of 8 years with pacemaker  . Total hip arthroplasty Bilateral   . Cardioversion  10/01/2011    Procedure: CARDIOVERSION;  Surgeon: Dani Gobble [name];  Location: [facility] OR;  Service: Cardiovascular;  Laterality: N/A;  OP CARDIOVERSION  . Cardioversion  11/02/12    successful  . Eye surgery Bilateral years ago    cataract lens replacment  . Esophagogastroduodenoscopy (egd) with propofol N/A 09/22/2014    Procedure: ESOPHAGOGASTRODUODENOSCOPY (EGD) WITH PROPOFOL;  Surgeon: Jerene Bears, MD;  Location: [facility];  Service: Gastroenterology;  Laterality: N/A;  . Savory dilation N/A 09/22/2014    Procedure: SAVORY DILATION;  Surgeon: Jerene Bears, MD;  Location: [facility];  Service: Gastroenterology;  Laterality: N/A;  . Permanent pacemaker generator change N/A 03/14/2014    Procedure: PERMANENT PACEMAKER GENERATOR CHANGE;  Surgeon: Sanda Klein, MD; Medtronic Adapta L model number [place], serial number [id number]    Family History  Problem Relation Age of Onset  . Colon cancer Brother 16  . Hypertension Brother   . Hypertension Father   . Parkinsonism Mother   . Hodgkin's lymphoma Grandchild    Social History  Substance Use Topics  . Smoking status: Former Smoker -- 1.00 packs/day for 20 years    Types: Cigarettes    Quit date: 11/17/1969  . Smokeless tobacco: Never Used  . Alcohol Use: No     Comment: none since 1990    Review of Systems  Constitutional: Negative for fever and chills.  HENT: Positive for dental problem.   Respiratory: Negative for cough, chest tightness and shortness of breath.   Cardiovascular: Negative for chest pain, palpitations and leg swelling.   Musculoskeletal: Negative for myalgias, arthralgias, neck pain and neck stiffness.  Neurological: Negative for dizziness, weakness, light-headedness, numbness and headaches.  Hematological: Bruises/bleeds easily.      Allergies  Codeine  Home Medications   Prior to Admission medications   Medication Sig Start Date End Date Taking? Authorizing Provider  beta carotene w/minerals (OCUVITE) tablet Take 1 tablet by mouth every morning.     Historical Provider, MD  MULTAQ 400 MG tablet TAKE 1 TABLET TWICE A DAY  WITH A MEAL 08/20/15   Mihai [name], MD  nebivolol (BYSTOLIC) 10 MG tablet Take 1 tablet (10 mg total) by mouth every morning. 03/01/15   Dickie La, MD  OVER THE COUNTER MEDICATION Place 1 each into both ears as needed (for allergies.). Ear drops.    Historical Provider, MD  pantoprazole (PROTONIX) 40 MG tablet Take 1 tablet (40 mg total) by mouth daily before breakfast. 10/03/15   Zenia Resides, MD  polyethylene glycol (MIRALAX / GLYCOLAX) packet Take 17 g by mouth daily. 08/30/15   Veatrice Bourbon, MD  Probiotic Product (PHILLIPS COLON HEALTH PO) Take 1 tablet by mouth every morning.     Historical Provider, MD  psyllium (HYDROCIL/METAMUCIL) 95 % PACK Take 1 packet by mouth daily. 08/30/15   Alyssa A Haney, MD  ramipril (ALTACE) 2.5 MG capsule Take 1 capsule (2.5 mg total) by mouth every morning. 06/29/15   Dickie La, MD  Rivaroxaban (XARELTO) 15 MG TABS tablet Take 1 tablet (15 mg total) by mouth at bedtime. 04/24/15   Renee A Kuneff, DO  silodosin (RAPAFLO) 4 MG CAPS capsule Take 1 capsule (4 mg total) by mouth daily with breakfast. 12/12/14   Dickie La, MD  torsemide [facility]) 10 MG tablet One tablet twice a week, Mondays and Fridays. 10/03/15   Zenia Resides, MD  traMADol (ULTRAM) 50 MG tablet Take 1 tablet (50 mg total) by mouth every 8 (eight) hours as needed for moderate pain. 03/30/15   Dickie La, MD   BP 139/69 mmHg  Pulse 80  Temp(Src) 98 F (36.7 C) (Axillary)   Resp 18  Ht 5\' 6"  (1.676 m)  Wt 83.915 kg  BMI 29.87 kg/m2  SpO2 99% Physical Exam  Constitutional: He appears well-developed and well-nourished. No distress.  HENT:  Head: Normocephalic and atraumatic.  Extracted left upper second molar, with clot present in the socket. There is surrounding bleeding from the area.  Eyes: Conjunctivae are normal.  Neck: Neck supple.  Cardiovascular: Normal rate, regular rhythm and normal heart sounds.   Pulmonary/Chest: Effort normal. No respiratory distress. He has no wheezes. He has no rales.  Musculoskeletal: He exhibits no edema.  Neurological: He is alert.  Skin: Skin is warm and dry.  Nursing note and vitals reviewed.   ED Course  Cauterization Date/Time: 11/08/2015 3:26 PM Performed by: Jeannett Senior Authorized by: Jeannett Senior Consent: Verbal consent obtained. Consent given by: patient Patient understanding: patient states understanding of the  procedure being performed Time out: Immediately prior to procedure a "time out" was called to verify the correct patient, procedure, equipment, support staff and site/side marked as required. Local anesthesia used: no Patient sedated: no Comments: Cauterization of bleeding gum, left upper   (including critical care time) Labs Review Labs Reviewed - No data to display  Imaging Review No results found. I have personally reviewed and evaluated these images and lab results as part of my medical decision-making.   EKG Interpretation None      MDM   Final diagnoses:  Bleeding from mouth     After removing all the clots from the mouth, applied direct pressure to the left upper tooth socket that is bleeding. Bleeding has slowed down. I provided PT back, held for 10 minutes. Applied lidocaine with epinephrine on the gauze and held for another 10 minutes. Her bleeding has slowed down significantly. I used silver nitrate sticks to cauterize the bleeding site. Patient has normal  vital signs, he is in no acute distress. Will discuss patient with his oral surgeon.  Discussed patient with Dr. Karma Lew, advised to continue pressure, do not wear dentures for several days. We will hold Xarelto for 2 days. Patient was monitored in the emergency department for several hours. No active bleeding. Discussed plan with the patient. Patient's vital signs remained stable with no tachycardia or hypoxia. Patient denies any dizziness or lightheadedness. I do not think he needs any blood work at this time. Close follow-up with oral surgery primary care doctor.  Filed Vitals:   11/08/15 1134 11/08/15 1142 11/08/15 1143 11/08/15 1145  BP:    152/87  Pulse: 69 71 75   Temp:   97.3 F (36.3 C)   TempSrc:   Oral   Resp:   20   Height:      Weight:      SpO2: 86% 99% 100%      Jeannett Senior, PA-C 11/08/15 [place], MD 11/08/15 [phone]

## 2015-11-08 NOTE — ED Notes (Addendum)
Pt instructed to bite down on guaze placed at site of bleeding by [place], Martin

## 2015-11-10 IMAGING — CR DG ABDOMEN 1V
2 series · 2 of 2 positions shown · non-contrast
Comparison: 08/20/2015

CLINICAL DATA: Abdominal pain and distension

EXAM:
ABDOMEN - 1 VIEW

[abdomen kub (1 of 2)]
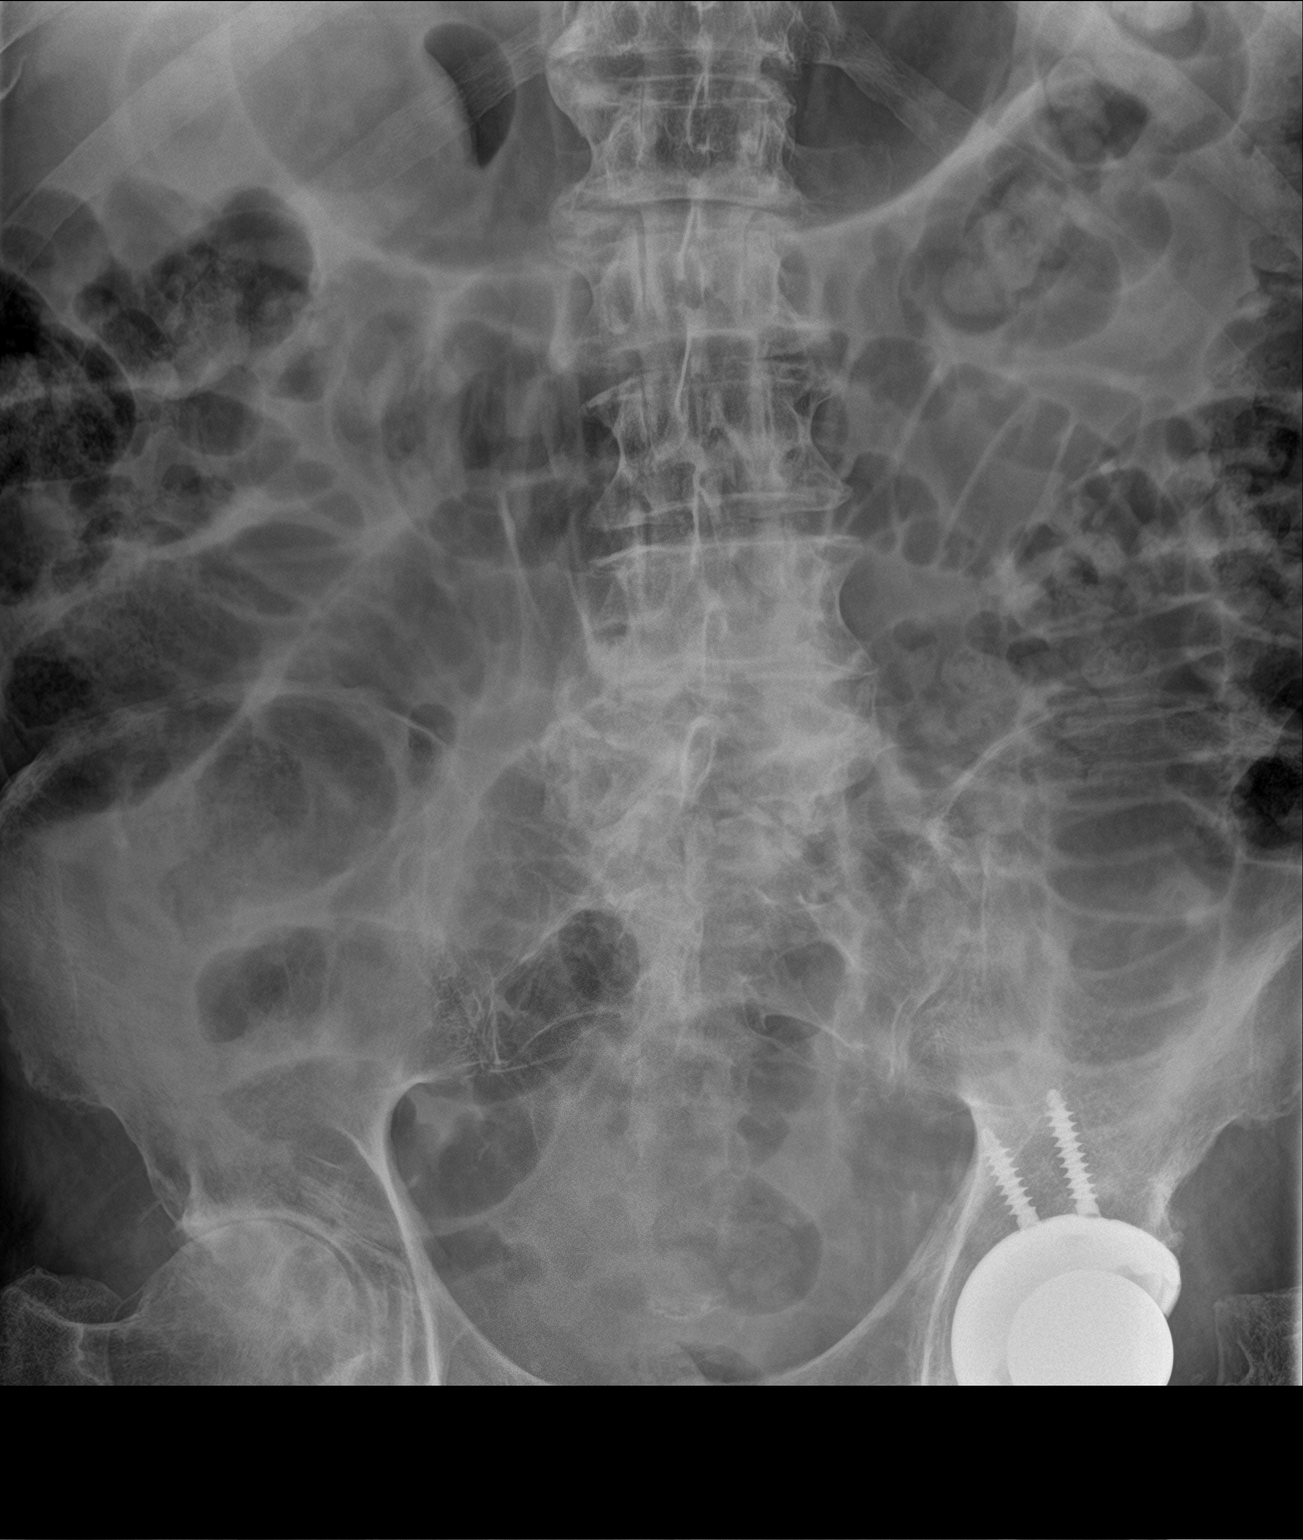

[abdomen kub (2 of 2)]
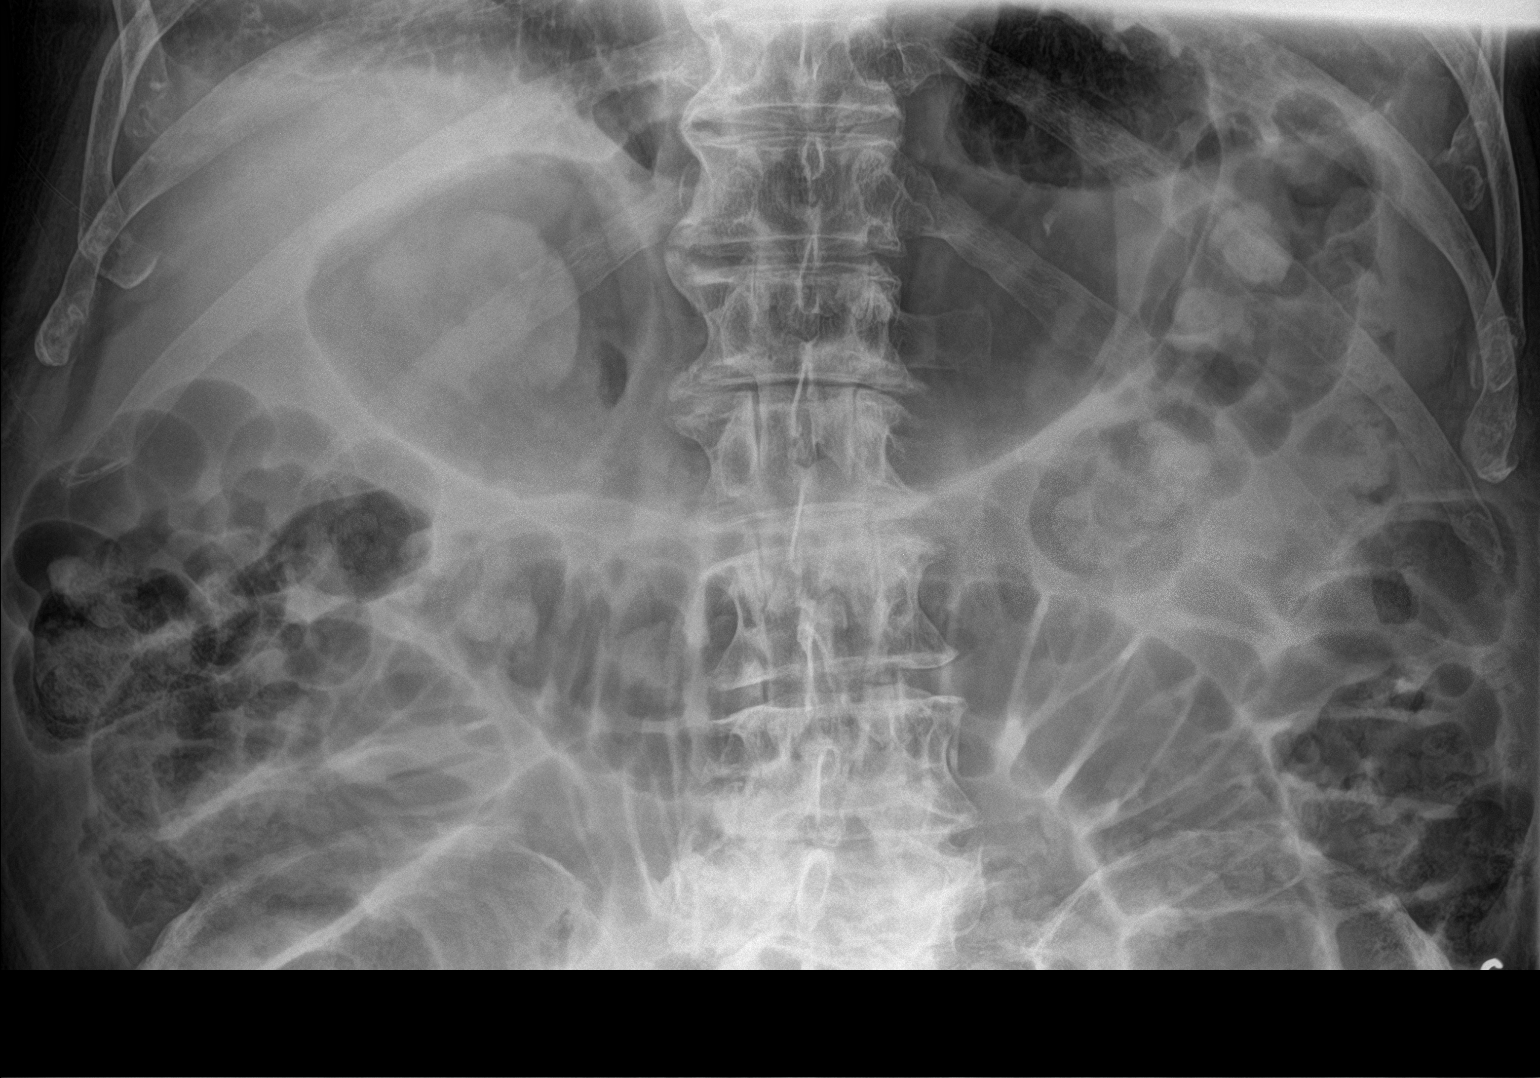

[2 of 2 positions shown; findings below may reference images not displayed]

FINDINGS: Scattered large and small bowel gas is noted. Persistent and
slightly increased small bowel dilatation is noted. The stomach is
distended with air. No abnormal mass or abnormal calcifications are
noted. Postsurgical changes in the left hip are noted. No free air
is noted.
IMPRESSION: Increasing small bowel distention when compared with the prior exam
consistent with a progressive partial small bowel obstruction. No
free air is seen.

## 2015-11-14 ENCOUNTER — Ambulatory Visit (INDEPENDENT_AMBULATORY_CARE_PROVIDER_SITE_OTHER): Payer: Medicare Other | Admitting: *Deleted

## 2015-11-14 DIAGNOSIS — D518 Other vitamin B12 deficiency anemias: Secondary | ICD-10-CM | POA: Diagnosis present

## 2015-11-14 MED ORDER — CYANOCOBALAMIN 1000 MCG/ML IJ SOLN
1000.0000 ug | Freq: Once | INTRAMUSCULAR | Status: AC
  Administered 2015-11-14: 1000 ug via INTRAMUSCULAR

## 2015-11-14 NOTE — Progress Notes (Signed)
   Patient in nurse clinic for Vitamin B-12 injection.  Injection given right deltoid.  Patient to call for next appointment.  Derl Barrow, RN

## 2015-11-21 ENCOUNTER — Other Ambulatory Visit: Payer: Self-pay | Admitting: *Deleted

## 2015-11-22 MED ORDER — GLUCOSE BLOOD VI STRP: ORAL_STRIP | Status: DC

## 2015-12-11 ENCOUNTER — Ambulatory Visit (INDEPENDENT_AMBULATORY_CARE_PROVIDER_SITE_OTHER): Payer: Medicare Other | Admitting: Cardiovascular Disease

## 2015-12-11 ENCOUNTER — Encounter: Payer: Self-pay | Admitting: Cardiovascular Disease

## 2015-12-11 VITALS — BP 122/76 | HR 88 | Ht 66.0 in | Wt 188.4 lb

## 2015-12-11 DIAGNOSIS — I480 Paroxysmal atrial fibrillation: Secondary | ICD-10-CM | POA: Diagnosis not present

## 2015-12-11 DIAGNOSIS — I441 Atrioventricular block, second degree: Secondary | ICD-10-CM | POA: Diagnosis not present

## 2015-12-11 DIAGNOSIS — Z950 Presence of cardiac pacemaker: Secondary | ICD-10-CM

## 2015-12-11 DIAGNOSIS — I495 Sick sinus syndrome: Secondary | ICD-10-CM

## 2015-12-11 DIAGNOSIS — Z7901 Long term (current) use of anticoagulants: Secondary | ICD-10-CM

## 2015-12-11 NOTE — Patient Instructions (Signed)
Remote monitoring is used to monitor your Pacemaker from home. This monitoring reduces the number of office visits required to check your device to one time per year. It allows [place] to monitor the functioning of your device to ensure it is working properly. You are scheduled for a device check from home on March 12, 2016. You may send your transmission at any time that day. If you have a wireless device, the transmission will be sent automatically. After your physician reviews your transmission, you will receive a postcard with your next transmission date.  Dr. [name] recommends that you schedule a follow-up appointment in: 6 MONTHS

## 2015-12-12 ENCOUNTER — Encounter: Payer: Self-pay | Admitting: Family Medicine

## 2015-12-12 ENCOUNTER — Ambulatory Visit (INDEPENDENT_AMBULATORY_CARE_PROVIDER_SITE_OTHER): Payer: Medicare Other | Admitting: Family Medicine

## 2015-12-12 VITALS — BP 165/88 | HR 81 | Temp 97.6°F | Ht 66.0 in | Wt 191.2 lb

## 2015-12-12 DIAGNOSIS — R300 Dysuria: Secondary | ICD-10-CM

## 2015-12-12 DIAGNOSIS — N183 Chronic kidney disease, stage 3 unspecified: Secondary | ICD-10-CM

## 2015-12-12 DIAGNOSIS — N401 Enlarged prostate with lower urinary tract symptoms: Secondary | ICD-10-CM

## 2015-12-12 DIAGNOSIS — R3914 Feeling of incomplete bladder emptying: Secondary | ICD-10-CM

## 2015-12-12 DIAGNOSIS — E0822 Diabetes mellitus due to underlying condition with diabetic chronic kidney disease: Secondary | ICD-10-CM | POA: Insufficient documentation

## 2015-12-12 DIAGNOSIS — L608 Other nail disorders: Secondary | ICD-10-CM

## 2015-12-12 LAB — POCT UA - MICROSCOPIC ONLY

## 2015-12-12 LAB — POCT URINALYSIS DIPSTICK
Bilirubin, UA: NEGATIVE
GLUCOSE UA: NEGATIVE
KETONES UA: NEGATIVE
Nitrite, UA: NEGATIVE
PROTEIN UA: 30
Spec Grav, UA: 1.02
Urobilinogen, UA: 1
pH, UA: 6.5

## 2015-12-12 MED ORDER — SULFAMETHOXAZOLE-TRIMETHOPRIM 800-160 MG PO TABS: 1.0000 | ORAL_TABLET | Freq: Two times a day (BID) | ORAL | Status: DC

## 2015-12-12 MED ORDER — GLUCOSE BLOOD VI STRP: ORAL_STRIP | Status: DC

## 2015-12-12 MED ORDER — LANCETS MISC: Status: DC

## 2015-12-12 MED ORDER — ONETOUCH ULTRA SYSTEM W/DEVICE KIT: 1.0000 | PACK | Freq: Once | Status: DC

## 2015-12-13 DIAGNOSIS — L608 Other nail disorders: Secondary | ICD-10-CM | POA: Insufficient documentation

## 2015-12-13 LAB — URINE CULTURE
Colony Count: NO GROWTH
ORGANISM ID, BACTERIA: NO GROWTH

## 2015-12-13 NOTE — Progress Notes (Signed)
   Subjective:    Patient ID: [name], male    DOB: May 16, 1920, 82 y.o.   MRN: [id number]  HPI #1. Burning on urination with increased frequency and increased urinary incontinence of the last week. He has not had fever, no abdominal pain. No blood in his urine. #2. History of BPH. He is stable in his ability to urinate. Has decreased urinary stream but nothing is changed in the last week to 2 weeks. Next line #3. Ingrown toenail on the right great toe. Causing him quite a bit of problems. He's had to buy some new shoes and even those are bothering him with compression. #3. He brings me a list of his blood sugars. He is checking them intermittently. He has no episodes of low blood sugar. PERTINENT  PMH / PSH: I have reviewed the patient's medications, allergies, past medical and surgical history. Pertinent findings that relate to today's visit / issues include: He has recently returned from the rehabilitation center at [facility]. He actually enjoyed his time there was glad to return home. This time he thinks he must to continue living at home.  Review of Systems See history of present illness. Additional part review of systems is negative for fever, sweats, chills. No appetite change although his appetite is generally down in the last 6-12 months. His weight has been stable. Mood is stable.    Objective:   Physical Exam Vital signs are reviewed GENERAL: Elderly male, somewhat frail but briskly walking using his walker. He can rise from a chair with use of his walker for assistance. EXTREMITY: Right great toe has a very curved ingrown nail. There is no sign of infection. There is some sign of onychomycosis and thickening creating a very large deformity in the soft tissue of the toe. The IP joint is nontender, not red, he has normal flexion and extension that is painless. ABDOMEN: Soft, positive bowel sounds nontender nondistended VASCULAR: Normal capillary refill in the right foot with  dorsalis pedis pulse 2+. SKIN: There is no erythema or induration of the right great toe.       Assessment & Plan:  Dysuria with abnormal UA today. We'll get culture. Given his age, history of BPH and other comorbidities I will treat 14 days with antibiotic.

## 2015-12-13 NOTE — Assessment & Plan Note (Signed)
I trimmed his nail quite a bit today. He actually needs to be removed because at some point he's going to have destruction of the soft tissue with subsequent infection. Ultimately this could lead to very serious infection and/or loss of the toe for him. We discussed. I'll get him back in the clinic next 2 weeks for nail removal with ablation of the nailbed on the right great toe.

## 2015-12-13 NOTE — Assessment & Plan Note (Signed)
Currently stable we will continue this medication regimen.

## 2015-12-13 NOTE — Progress Notes (Signed)
Patient ID: [name], male   DOB: 1920/10/06, 82 y.o.   MRN: [id number]     Cardiology Office Note    Date:  12/13/2015   ID:  [name], DOB 1920/08/18, MRN [id number]  PCP:  [name], MD  Cardiologist:   Sanda Klein, MD   chief complaint: pacemaker check.   History of Present Illness:  [name] is a 82 y.o. male with paroxysmal atrial fibrillation, sinus node dysfunction, high-grade second-degree AV block, s/p dual chamber pacemaker (2006, generator change 2015). He takes Multaq for AF prevention, is on Xarelto for embolic stroke prevention. He has well controlled HTN and mild, diet controlled type 2 DM.  He had some problems with extended bleeding after a dental extraction in October and had to temporarily interrupt his anticoagulant. He denieschanges in exercise capacity, dyspnea, angina, edema, focal neurological events, intermittent claudication or other cardiovascular issues.  Pacemaker interrogation shows normal device function with estimated generator longevity of 9 years, atrial pacing 98.6%, ventricular pacing 100%. Approximate 100 episodes of mode switch are recorded but all of them are under 1 minute in duration. No electrograms are visible for review. No clear evidence of recurrent atrial fibrillation since last device check. Heart rate histogram is favorable.  Past Medical History  Diagnosis Date  . Overactive bladder   . Arthritis   . Diverticulosis of colon (without mention of hemorrhage)   . Gout   . Chronic kidney disease, stage III (moderate)   . Family history of malignant neoplasm of gastrointestinal tract   . Stricture and stenosis of esophagus   . Acute gastritis without mention of hemorrhage   . Unspecified cataract   . Hypertrophy of prostate with urinary obstruction and other lower urinary tract symptoms (LUTS)   . Personal history of colonic polyps 10/21/2006    hyperplastic   . Vitamin B12 deficiency   . Hiatal hernia   . Type II or  unspecified type diabetes mellitus without mention of complication, not stated as uncontrolled     pt states that he checks his blood sugar once weekly, is not on oral meds or insulin  . Atrial fibrillation ([place])   . Sinus node dysfunction (HCC)   . Presence of permanent cardiac pacemaker 10/14/05    medtronic Adapta  . Hypertension   . Esophageal dysmotility   . Second degree AV block 11/29/2014  . Environmental allergies     HISTORY OF  . GERD (gastroesophageal reflux disease)   . History of skin cancer   . Small bowel obstruction [name])     Past Surgical History  Procedure Laterality Date  . Appendectomy    . Tonsillectomy    . Pacemaker insertion  replaced 2 months ago, total of 8 years with pacemaker  . Total hip arthroplasty Bilateral   . Cardioversion  10/01/2011    Procedure: CARDIOVERSION;  Surgeon: Dani Gobble Emet Rafanan;  [facility] OR;  Service: Cardiovascular;  Laterality: N/A;  OP CARDIOVERSION  . Cardioversion  11/02/12    successful  . Eye surgery Bilateral years ago    cataract lens replacment  . Esophagogastroduodenoscopy (egd) with propofol N/A 09/22/2014    Procedure: ESOPHAGOGASTRODUODENOSCOPY (EGD) WITH PROPOFOL;  Surgeon: Jerene Bears, MD;  [facility];  Service: Gastroenterology;  Laterality: N/A;  . Savory dilation N/A 09/22/2014    Procedure: SAVORY DILATION;  Surgeon: Jerene Bears, MD;  [facility];  Service: Gastroenterology;  Laterality: N/A;  . Permanent pacemaker generator change N/A 03/14/2014  Procedure: PERMANENT PACEMAKER GENERATOR CHANGE;  Surgeon: Sanda Klein, MD; Medtronic Adapta L model number ADDRL1, serial number [id number]     Outpatient Prescriptions Prior to Visit  Medication Sig Dispense Refill  . beta carotene w/minerals (OCUVITE) tablet Take 1 tablet by mouth every morning.     . MULTAQ 400 MG tablet TAKE 1 TABLET TWICE A DAY  WITH A MEAL 180 tablet 0  . nebivolol (BYSTOLIC) 10 MG tablet Take 1 tablet (10 mg total)  by mouth every morning. 90 tablet 3  . pantoprazole (PROTONIX) 40 MG tablet Take 1 tablet (40 mg total) by mouth daily before breakfast. 90 tablet 3  . polyethylene glycol (MIRALAX / GLYCOLAX) packet Take 17 g by mouth daily. 14 each 0  . Probiotic Product (PHILLIPS COLON HEALTH PO) Take 1 tablet by mouth every morning.     . psyllium (HYDROCIL/METAMUCIL) 95 % PACK Take 1 packet by mouth daily. 56 each 2  . ramipril (ALTACE) 2.5 MG capsule Take 1 capsule (2.5 mg total) by mouth every morning. 90 capsule 3  . Rivaroxaban (XARELTO) 15 MG TABS tablet Take 1 tablet (15 mg total) by mouth at bedtime. 90 tablet 3  . silodosin (RAPAFLO) 4 MG CAPS capsule Take 1 capsule (4 mg total) by mouth daily with breakfast. 90 capsule 3  . torsemide (DEMADEX) 10 MG tablet One tablet twice a week, Mondays and Fridays. 28 tablet 3  . traMADol (ULTRAM) 50 MG tablet Take 1 tablet (50 mg total) by mouth every 8 (eight) hours as needed for moderate pain. 90 tablet 3  . glucose blood (BAYER CONTOUR TEST) test strip Use as instructed to test blood sugars weekly. 100 each 12  . OVER THE COUNTER MEDICATION Place 1 each into both ears daily as needed (for allergies.). Ear drops.     No facility-administered medications prior to visit.     Allergies:   Codeine   Social History   Social History  . Marital Status: Widowed    Spouse Name: N/A  . Number of Children: N/A  . Years of Education: N/A   Occupational History  . retired    Social History Main Topics  . Smoking status: Former Smoker -- 1.00 packs/day for 20 years    Types: Cigarettes    Quit date: 11/17/1969  . Smokeless tobacco: Never Used  . Alcohol Use: No     Comment: none since 1990  . Drug Use: No  . Sexual Activity: Not Asked   Other Topics Concern  . None   Social History Narrative   He is going to [facility] [place].     Family History:  The patient's family history includes Colon cancer (age of onset: 12) in his  brother; Hodgkin's lymphoma in his grandchild; Hypertension in his brother and father; Parkinsonism in his mother.   ROS:   Please see the history of present illness.    ROS All other systems reviewed and are negative.   PHYSICAL EXAM:   VS:  BP 122/76 mmHg  Pulse 88  Ht 5\' 6"  (1.676 m)  Wt 188 lb 6.4 oz (85.458 kg)  BMI 30.42 kg/m2   GEN: Well nourished, well developed, in no acute distress HEENT: normal Neck: no JVD, carotid bruits, or masses Cardiac: RRR; no murmurs, rubs, or gallops,no edema; paradoxically split second heart sound, healthy left subclavian pacemaker site. Respiratory:  clear to auscultation bilaterally, normal work of breathing GI: soft, nontender, nondistended, + BS MS: no deformity or  atrophy Skin: warm and dry, no rash Neuro:  Alert and Oriented x 3, Strength and sensation are intact Psych: euthymic mood, full affect  Wt Readings from Last 3 Encounters:  12/12/15 191 lb 3.2 oz (86.728 kg)  12/11/15 188 lb 6.4 oz (85.458 kg)  11/08/15 185 lb (83.915 kg)      Studies/Labs Reviewed:   EKG:  EKG is ordered today.  The ekg ordered today demonstrates AV sequential pacing, positive R-wave in lead V1 with the pacing.  Recent Labs: 08/20/2015: ALT 17 08/26/2015: Magnesium 1.8 08/28/2015: BUN 6; Creatinine, Ser 1.05; Potassium 3.9; Sodium 137 08/29/2015: Hemoglobin 11.1*; Platelets 142*   Lipid Panel    Component Value Date/Time   CHOL 152 07/05/2008 2038   TRIG 315* 07/05/2008 2038   HDL 28* 07/05/2008 2038   CHOLHDL 5.4 Ratio 07/05/2008 2038   VLDL 63* 07/05/2008 2038   LDLCALC 61 07/05/2008 2038   LDLDIRECT 84 12/06/2014 1215     ASSESSMENT:    1. PAF (paroxysmal atrial fibrillation) ([place])   2. Second degree AV block   3. SICK SINUS SYNDROME   4. Anticoagulant long-term use   5. Pacemaker - Medtronic Adapta 2006, gen change 2015      PLAN:  In order of problems listed above:  1. On Multaq. Required DCCV in 2013. No recent episodes of  atrial fibrillation by device check. CHADSVasc score 4 (age 34, HTN1, DM1). Both Multaq and xarelto should be taken with meals. 2. Cause of high prevalence of ventricular pacing, but not pacemaker dependent 3. Current sensor settings are appropriate, no evidence to suggest chronotropic incompetence 4. Other than the tooth extraction episode, no bleeding problems 5. Normal pacemaker function, continue remote downloads every 3 months and office visit in 6 months    Medication Adjustments/Labs and Tests Ordered: Current medicines are reviewed at length with the patient today.  Concerns regarding medicines are outlined above.  Medication changes, Labs and Tests ordered today are listed in the Patient Instructions below. Patient Instructions  Remote monitoring is used to monitor your Pacemaker from home. This monitoring reduces the number of office visits required to check your device to one time per year. It allows [place] to monitor the functioning of your device to ensure it is working properly. You are scheduled for a device check from home on March 12, 2016. You may send your transmission at any time that day. If you have a wireless device, the transmission will be sent automatically. After your physician reviews your transmission, you will receive a postcard with your next transmission date.  Dr. [name] recommends that you schedule a follow-up appointment in: [place], Consuello Lassalle, MD  12/13/2015 12:12 PM    [address], [place], [place]  [address] Phone: [phone]; Fax: [phone]

## 2015-12-17 ENCOUNTER — Encounter: Payer: Self-pay | Admitting: Family Medicine

## 2015-12-27 ENCOUNTER — Ambulatory Visit (INDEPENDENT_AMBULATORY_CARE_PROVIDER_SITE_OTHER): Payer: Medicare Other | Admitting: Family Medicine

## 2015-12-27 VITALS — BP 149/69 | HR 91 | Temp 97.6°F | Ht 66.0 in | Wt 187.3 lb

## 2015-12-27 DIAGNOSIS — D518 Other vitamin B12 deficiency anemias: Secondary | ICD-10-CM | POA: Diagnosis not present

## 2015-12-27 DIAGNOSIS — L600 Ingrowing nail: Secondary | ICD-10-CM | POA: Diagnosis present

## 2015-12-27 DIAGNOSIS — K921 Melena: Secondary | ICD-10-CM

## 2015-12-27 DIAGNOSIS — N401 Enlarged prostate with lower urinary tract symptoms: Secondary | ICD-10-CM | POA: Diagnosis not present

## 2015-12-27 DIAGNOSIS — R300 Dysuria: Secondary | ICD-10-CM | POA: Diagnosis not present

## 2015-12-27 DIAGNOSIS — R3914 Feeling of incomplete bladder emptying: Secondary | ICD-10-CM | POA: Diagnosis not present

## 2015-12-27 LAB — CBC
HCT: 38.2 % — ABNORMAL LOW (ref 39.0–52.0)
HEMOGLOBIN: 12.4 g/dL — AB (ref 13.0–17.0)
MCH: 29.6 pg (ref 26.0–34.0)
MCHC: 32.5 g/dL (ref 30.0–36.0)
MCV: 91.2 fL (ref 78.0–100.0)
MPV: 11.4 fL (ref 8.6–12.4)
PLATELETS: 215 10*3/uL (ref 150–400)
RBC: 4.19 MIL/uL — AB (ref 4.22–5.81)
RDW: 14.5 % (ref 11.5–15.5)
WBC: 6.9 10*3/uL (ref 4.0–10.5)

## 2015-12-27 MED ORDER — CYANOCOBALAMIN 1000 MCG/ML IJ SOLN
1000.0000 ug | Freq: Once | INTRAMUSCULAR | Status: AC
  Administered 2015-12-27: 1000 ug via INTRAMUSCULAR

## 2015-12-27 NOTE — Assessment & Plan Note (Signed)
Increased problems with initiation and emptying. Will increase Rapaflo---we need to watch for dizziness as this was a problem when we had him on higher doses in past. See patient instruction. F/u 3-4 weeks

## 2015-12-27 NOTE — Progress Notes (Signed)
   Patient requested Vitamin B-12 injection today.  Injection given Right deltoid.  Derl Barrow, RN

## 2015-12-27 NOTE — Progress Notes (Signed)
Patient ID: [name], male   DOB: Oct 09, 1920, 82 y.o.   MRN: [id number] IPROCEDURE NOTE: ngrown nail, no infection.  Patient given informed consent, signed copy in the chart. Appropriate time out taken.  Large rubberband tightened with forceps used as tourniquet placed at base of the LEFT GREAT  toe.  Area prepped and draped in usual sterile fashion.  Digital block done using 2% lidocaine without epinephrine, 3cc total.  Nail elevated using nail elevator, removed using hemostats and traction force.  Nail bed was ablated using curettage and phenol followed by copious alcohol wash.  No complications.  Minimal bleeding, less than 3 cc total. Due to treatment with NOAC 9xarelto) I applied pressure to the nail for 10 minutes as there was some residual oozing and I applied small amount of Monsels solution to the  Medial nail border. Once hemostasis was obtained, saline was again used as a copious wash. Antibiotic ointment and .  Sterile bandage applied and post procedure instructions given.  Patient tolerated procedure well without complications.

## 2015-12-27 NOTE — Progress Notes (Signed)
   Subjective:    Patient ID: [name], male    DOB: 26-Mar-1920, 82 y.o.   MRN: [id number]  HPI 1. Continued problems with  Incomplete emptying of his bladder, initiation of urination.  This did not get significantly better after the antibiotic treatment which she has completed.  He is seen no blood in  He has no burning on urination. #2.  Reports seeing some in his stool over the last week or so.  Has not   No abdominal pain.  Stool unchanged in caliber.Marland Kitchen  Appetite is the same #3.  Regarding his anticoagulation, he stopped it yesterday.  He has questions about when to restart..   Review of Systems See hpi    Objective:   Physical Exam   Vital signs reviewed. GENERAL: Well-developed, well-nourished, no acute distress. ABDOMEN: Soft positive bowel sounds. No rebound or guarding. NEURO: No gross focal neurological deficits except for decreased hearing.Sensation to softtouch is intact B feet. MSK: Movement of extremity x 4. Using a walker EXT: no edema. Normal cap refill. Left great toenail deformed, severely ingrown on medial and lateral sides. No sign of infection but there is thickening of the nail. The greattoenail on the right foot is extremely long, beginning to show some deformity but without sign of infection,.        Assessment & Plan:  1. Ingrown deformed nail on left great toe removed with nail bed ablation. I trimmed the toenail on right great toe--ot will likely need to be removed soon. 2. Bright red blood per rectum--will check hemoglobin. He is not showing any signs of anemia or hypotension. He will go to ED if his symptoms acutely worsen as we discussed.f/u 3-4 weeks.If hgb os low, we may need todo further w/u and or transfusion. If not low, I would watch unless symptoms worsen.I would not recommend colonoscopy given his age (and hx of bowel surgeries/ adhesions etc).

## 2015-12-27 NOTE — Patient Instructions (Addendum)
Keep your foot dry today. You can shower in the morning. Take the bandage off, shower, then dry the toe well. It may ooze a little blood for first 24 hours. After drying it, put a little rubbing alcohol on it and dry that, then a small dab of antibiotic ointemnt and a new bandaid. Do this for next 3 days when you shower or get it wet.  It should not bleed a lot---but it may ooze a few spots on teh bandaid for the first 24-36 hours.  Keep it elevated tonight when you are sitting down. You may walk as you normally would  Take some of your tramadol pain medicine when you get home because it may hrt when the numbing wears off in a few hours. Continue to take the tramadol as you need it over next 2 days---the pain should improve.  CALL ME with ANY QUESTIONS OR CONCERNS.  I will check your hemoglobin today and send you a note--let me see you back in 2-4 weeks just for a check on some of these issues.  RE: your urination problems, lets increase your RAPAFLO. You are currently taking a single 4 mg tablet a day. If you can break them in half, add a half one each day for a total of 1 1/2 tabs.  If you cannot break them, lets alternate days taking a single tab one day and then 2 tabs the next day. If you get dizzy with this increase, go back to the  Original single dose.   Start xarelto tomorrow.

## 2016-01-01 ENCOUNTER — Encounter: Payer: Self-pay | Admitting: *Deleted

## 2016-01-01 ENCOUNTER — Encounter: Payer: Self-pay | Admitting: Family Medicine

## 2016-01-08 ENCOUNTER — Telehealth: Payer: Self-pay | Admitting: *Deleted

## 2016-01-08 ENCOUNTER — Other Ambulatory Visit: Payer: Self-pay | Admitting: Family Medicine

## 2016-01-08 MED ORDER — CEPHALEXIN 500 MG PO CAPS: 500.0000 mg | ORAL_CAPSULE | Freq: Four times a day (QID) | ORAL | Status: DC

## 2016-01-08 NOTE — Telephone Encounter (Signed)
Patient walked into clinic today stating his left big toe was infected.  Patient has left big toenail removed on 12/27/15.  Dr. Nori Riis assessed toe; patient to apply topical antibiotic ointment Bacitracin on the toe and clean with peroxide twice a day.  Patient to leave toe open to air unless he need to put his shoes and socks on.  Patient to wrap toe with 2x2 if putting on shoes and socks.  Oral antibiotics were going to be called in for the patient.  Will forward to PCP.  Derl Barrow, RN

## 2016-01-08 NOTE — Progress Notes (Signed)
came in with some redness of toe where we took off toenail. Mild erythema at nail bed borders, not extending past IP joint. Not warm,. Nail bed reveals no pus but is moist. Can bend toe OK. I think he has been bandaging it up too much--will have him tdo bid wash with peroxide (to get some drying effect and cleanse residual debris, small amount of mupirocin, bandage wit very LOOSE gauze. Antibiotics. Call w worsening.

## 2016-01-11 ENCOUNTER — Encounter: Payer: Self-pay | Admitting: Internal Medicine

## 2016-01-11 ENCOUNTER — Ambulatory Visit (INDEPENDENT_AMBULATORY_CARE_PROVIDER_SITE_OTHER): Payer: Medicare Other | Admitting: Internal Medicine

## 2016-01-11 VITALS — BP 118/60 | HR 77 | Ht 66.0 in | Wt 190.0 lb

## 2016-01-11 DIAGNOSIS — K5900 Constipation, unspecified: Secondary | ICD-10-CM | POA: Diagnosis not present

## 2016-01-11 DIAGNOSIS — K224 Dyskinesia of esophagus: Secondary | ICD-10-CM | POA: Diagnosis not present

## 2016-01-11 DIAGNOSIS — K567 Ileus, unspecified: Secondary | ICD-10-CM

## 2016-01-11 DIAGNOSIS — K219 Gastro-esophageal reflux disease without esophagitis: Secondary | ICD-10-CM | POA: Diagnosis not present

## 2016-01-11 DIAGNOSIS — R140 Abdominal distension (gaseous): Secondary | ICD-10-CM

## 2016-01-11 NOTE — Patient Instructions (Signed)
Please increase your Miralax to twice daily dosing.  Increase Metamucil to 1 tablespoon daily.  Continue Pantoprazole 40 mg daily.  Please follow up with Dr Hilarie Fredrickson in 3-4 months.

## 2016-01-11 NOTE — Progress Notes (Signed)
Subjective:    Patient ID: [name], male    DOB: 1919/11/27, 82 y.o.   MRN: [id number]  HPI [name] is a 82 year old male with a past medical history of GERD, esophageal dysmotility, colon polyps, diverticulosis, constipation who is here for follow-up. He was last seen in January 2016 and is here today with his daughter-in-law. He reports currently he is doing fairly well but was hospitalized for 3 weeks in October 2016 with partial small bowel obstruction/ileus type pattern. This was followed by 4-5 weeks in inpatient rehabilitation. He required NG tube decompression but did not require surgery. A CT scan was performed during this hospitalization of the abdomen and pelvis which I have reviewed. This showed proximal GI tract dilatation without focal transition point. There was colonic diverticulosis but no evidence of GI tract mass or malignancy. Since hospitalization he's been using MiraLAX and Metamucil which he feels this helped his bowel movements. He still feels that his bowel movements are slower than normal on some days and so he uses milk of magnesia. This helps produce a more normal bowel movement. He denies seeing blood in his stool or melena. He reports his appetite has returned mostly to normal. He's not having any issues with dysphagia present. He denies abdominal pain. Occasionally he does feel more bloated or feels pressure in his midabdomen. This is better after bowel movement  Last colonoscopy was December 2007. Prep was poor. He had left-sided diverticulosis. A polyp 5 mm removed from the sigmoid colon found to be a hyperplastic polyp.  Last EGD 09/22/2014. Normal esophagus with balloon dilation to 18 mm across the GE junction in the distal esophagus. The stomach and duodenum appeared normal.  He does continue on pantoprazole 40 mg once daily. With this he's had no heartburn. Again no dysphagia.  He also continues on Xarelto for his history of atrial  fibrillation.  Review of Systems As per history of present illness, otherwise negative  Current Medications, Allergies, Past Medical History, Past Surgical History, Family History and Social History were reviewed in [employer] record.     Objective:   Physical Exam BP 118/60 mmHg  Pulse 77  Ht 5\' 6"  (1.676 m)  Wt 190 lb (86.183 kg)  BMI 30.68 kg/m2 Constitutional: Elderly appearing male in no acute distress HEENT: Normocephalic and atraumatic. Conjunctivae are normal.  No scleral icterus. Neck: Neck supple. Trachea midline. Cardiovascular: Normal rate, regular rhythm and intact distal pulses. No M/R/G Pulmonary/chest: Effort normal and breath sounds normal. No wheezing, rales or rhonchi. Abdominal: Soft, obese, nontender, nondistended. Bowel sounds active throughout. There are no masses palpable.  Extremities: no clubbing, cyanosis, or edema Neurological: Alert and oriented to person place and time. Skin: Skin is warm and dry.  Psychiatric: Normal mood and affect. Behavior is normal.    CT ABDOMEN AND PELVIS WITHOUT CONTRAST   TECHNIQUE: Multidetector CT imaging of the abdomen and pelvis was performed following the standard protocol without IV contrast.   COMPARISON:  08/31/2014, 01/05/2014   FINDINGS: Lower chest: Pacemaker in the rest of the heart. Heart is enlarged. Thoracic aortic atherosclerosis and coronary calcifications noted. No pericardial or pleural effusion. No significant hiatal hernia.   Lower thoracic degenerative osteophytes on the right.   Minor basilar scarring.  Otherwise lung bases clear.   Abdomen: Liver, gallbladder, biliary system, atrophic fatty replaced pancreas, and adrenal glands are within normal limits for age and noncontrast imaging. Spleen demonstrates scattered calcifications from granulomatous disease. Small accessory splenules  in the left upper quadrant.   Kidneys demonstrate no acute obstruction,  hydronephrosis, perinephric inflammatory process, or obstructing ureteral calculus on either side.   Aortic atherosclerosis noted without aneurysm or retroperitoneal hemorrhage.   Mild to moderate distention of the stomach and proximal small bowel with contrast and air. Distal small bowel is collapsed however there is air in the colon. Colonic diverticulosis noted. Appearance of the bowel is nonspecific.   No abdominal free fluid, fluid collection, hemorrhage, abscess, or adenopathy.   Pelvis: Diverticulosis of the descending colon and sigmoid. No acute inflammatory process. No pelvic free fluid, fluid collection, hemorrhage, abscess, adenopathy, inguinal abnormality, or hernia. Urinary bladder unremarkable. No acute distal bowel process.   Left hip replacement noted creating artifact. Degenerative changes of the right hip, pelvis and spine.   IMPRESSION: Gastric and proximal small bowel distention with air and contrast, nonspecific. No definite obstruction pattern. Negative for free air.   No acute obstructing urinary tract or ureteral calculus. No hydronephrosis or obstructive uropathy   Abdominal atherosclerosis   Colonic diverticulosis without acute inflammation     Electronically Signed   By: Jerilynn Mages.  Shick M.D.   On: 08/18/2015 14:30        CBC    Component Value Date/Time   WBC 6.9 12/27/2015 1119   RBC 4.19* 12/27/2015 1119   HGB 12.4* 12/27/2015 1119   HCT 38.2* 12/27/2015 1119   PLT 215 12/27/2015 1119   MCV 91.2 12/27/2015 1119   MCH 29.6 12/27/2015 1119   MCHC 32.5 12/27/2015 1119   RDW 14.5 12/27/2015 1119   LYMPHSABS 0.8 08/20/2015 0525   MONOABS 0.7 08/20/2015 0525   EOSABS 0.0 08/20/2015 0525   BASOSABS 0.0 08/20/2015 0525    CMP     Component Value Date/Time   NA 137 08/28/2015 0419   K 3.9 08/28/2015 0419   CL 103 08/28/2015 0419   CO2 26 08/28/2015 0419   GLUCOSE 115* 08/28/2015 0419   BUN 6 08/28/2015 0419   CREATININE 1.05 08/28/2015  0419   CREATININE 1.33 12/06/2014 1215   CALCIUM 8.5* 08/28/2015 0419   PROT 6.2* 08/20/2015 0525   ALBUMIN 3.6 08/20/2015 0525   AST 33 08/20/2015 0525   ALT 17 08/20/2015 0525   ALKPHOS 39 08/20/2015 0525   BILITOT 1.0 08/20/2015 0525   GFRNONAA 59* 08/28/2015 0419   GFRAA >60 08/28/2015 0419         Assessment & Plan:   82 year old male with a past medical history of GERD, esophageal dysmotility, colon polyps, diverticulosis, constipation who is here for follow-up.  1. Partial SBO/ileus -- no clear precipitant. No overt pathology on noncontrasted abdominal CT scan. See #2  2. Chronic constipation -- I have encouraged he continue MiraLAX and Metamucil daily. I'm going to increase MiraLAX to 17 g twice a day given that he is still needing milk of magnesia on occasion. Increase Metamucil from 1 teaspoon to 1 tablespoon daily. Can still use milk of magnesia on an as-needed basis if necessary. If this continues to be an issue I encouraged him to notify me and he voiced understanding  3. GERD history of esophageal dysmotility -- continue pantoprazole 40 mg daily. No dysphagia at present  Follow-up in 3-4 months, sooner if necessary 25 minutes spent with the patient today. Greater than 50% was spent in counseling and coordination of care with the patient

## 2016-01-14 ENCOUNTER — Telehealth: Payer: Self-pay | Admitting: Cardiovascular Disease

## 2016-01-14 MED ORDER — DRONEDARONE HCL 400 MG PO TABS: 400.0000 mg | ORAL_TABLET | Freq: Two times a day (BID) | ORAL | Status: DC

## 2016-01-14 NOTE — Telephone Encounter (Signed)
Pt's dtr n law calling for a refill of Multaq at [facility] 90 day supply with refills

## 2016-01-14 NOTE — Telephone Encounter (Signed)
Refill sent.

## 2016-01-15 LAB — CUP PACEART INCLINIC DEVICE CHECK
Battery Voltage: 2.79 V
Brady Statistic AP VS Percent: 0 %
Brady Statistic AS VP Percent: 1 %
Implantable Lead Implant Date: 20061128
Implantable Lead Implant Date: 20061128
Implantable Lead Location: 753859
Implantable Lead Model: 4592
Implantable Lead Model: 5092
Lead Channel Impedance Value: 573 Ohm
Lead Channel Setting Sensing Sensitivity: 4 mV
MDC IDC LEAD LOCATION: 753860
MDC IDC MSMT BATTERY IMPEDANCE: 160 Ohm
MDC IDC MSMT BATTERY REMAINING LONGEVITY: 110 mo
MDC IDC MSMT LEADCHNL RV IMPEDANCE VALUE: 627 Ohm
MDC IDC SESS DTM: 20170124154450
MDC IDC SET LEADCHNL RA PACING AMPLITUDE: 2 V
MDC IDC SET LEADCHNL RV PACING AMPLITUDE: 2.5 V
MDC IDC SET LEADCHNL RV PACING PULSEWIDTH: 0.4 ms
MDC IDC STAT BRADY AP VP PERCENT: 99 %
MDC IDC STAT BRADY AS VS PERCENT: 0 %

## 2016-01-16 ENCOUNTER — Ambulatory Visit (INDEPENDENT_AMBULATORY_CARE_PROVIDER_SITE_OTHER): Payer: Medicare Other | Admitting: Family Medicine

## 2016-01-16 ENCOUNTER — Encounter: Payer: Self-pay | Admitting: Family Medicine

## 2016-01-16 VITALS — BP 140/80 | HR 81 | Temp 98.6°F | Ht 66.0 in | Wt 191.0 lb

## 2016-01-16 DIAGNOSIS — N401 Enlarged prostate with lower urinary tract symptoms: Secondary | ICD-10-CM | POA: Diagnosis present

## 2016-01-16 DIAGNOSIS — R3914 Feeling of incomplete bladder emptying: Secondary | ICD-10-CM

## 2016-01-16 MED ORDER — SILODOSIN 4 MG PO CAPS: ORAL_CAPSULE | ORAL | Status: DC

## 2016-01-16 NOTE — Progress Notes (Signed)
   Subjective:    Patient ID: [name], male    DOB: 03-Dec-1919, 82 y.o.   MRN: [id number]  HPI  #1. Left great toe pain significant improved. Redness is gone. He completed antibiotics.  #2. Is alternating his prostate medicine as we discussed and is having some mild improvement in his urinary retention symptoms , he feels like he can start his stream a little bit easier but still not as well as he would like   Review of Systems  no fever. No burning on urination.    Objective:   Physical Exam  vital signs reviewed GEN.: Well-developed male no acute distress EXTREMITY: left great toe shows a healed area of the nailbed. There is no erythema. PIP joint is nontender, the MTP joint is nontender.       Assessment & Plan:   #1. We removed his great toenail and he seems to finally be resolving on his issues with that. At this point I think he can quit putting a Band-Aid on it and just go back to normal care.  #2. Regarding his prostate hypertrophy and urinary issues, we'll continue with the current alternating dose of Rapaflo.

## 2016-01-17 ENCOUNTER — Encounter: Payer: Self-pay | Admitting: Cardiovascular Disease

## 2016-01-28 ENCOUNTER — Encounter: Payer: Self-pay | Admitting: Family Medicine

## 2016-01-28 ENCOUNTER — Ambulatory Visit (INDEPENDENT_AMBULATORY_CARE_PROVIDER_SITE_OTHER): Payer: Medicare Other | Admitting: Family Medicine

## 2016-01-28 VITALS — BP 146/71 | HR 92 | Temp 97.9°F | Wt 192.0 lb

## 2016-01-28 DIAGNOSIS — D518 Other vitamin B12 deficiency anemias: Secondary | ICD-10-CM

## 2016-01-28 DIAGNOSIS — M79675 Pain in left toe(s): Secondary | ICD-10-CM | POA: Diagnosis present

## 2016-01-28 DIAGNOSIS — S91311A Laceration without foreign body, right foot, initial encounter: Secondary | ICD-10-CM

## 2016-01-28 MED ORDER — CYANOCOBALAMIN 1000 MCG/ML IJ SOLN
1000.0000 ug | Freq: Once | INTRAMUSCULAR | Status: AC
  Administered 2016-01-28: 1000 ug via INTRAMUSCULAR

## 2016-01-28 NOTE — Assessment & Plan Note (Signed)
Month B12 injection given today.

## 2016-01-28 NOTE — Progress Notes (Signed)
    Subjective:  [name] is a 82 y.o. male who presents to the [facility] today with a chief complaint of toe pain and foot injury.   HPI:  Left Great Toe Pain  Patient had his toenail removed approximately 4 weeks ago and has noticed mild pain in that toe while walking since then. Pain is intermittent in nature and only really noticed while bearing weight. He has not noticed and redness or swelling. No fevers or chills. He has not taken any medication specifically for the toe pain, but is using tramadol which helps with the pain. Patient is concerned that it may be infected.  Right Foot Injury Patient noticed an area of skin breakdown on the top of his right foot several days ago and is concerned that it may be infected. No redness, drainage or swelling. No pain. He has been putting a bandage on the foot, but has not tried any other medications.  ROS: Per HPI   Objective:  Physical Exam: BP 146/71 mmHg  Pulse 92  Temp(Src) 97.9 F (36.6 C) (Oral)  Wt 192 lb (87.091 kg)  Gen: NAD, resting comfortably MSK:  - Left Great Toe: Hyper keratotic area over nailbed with no edema or erythema. Nontender to palpation. FROM.  - Right Foot: Small 1cm laceration on Medial aspect of foot well healing. No erythema or edema.  Neuro: grossly normal, moves all extremities  Assessment/Plan:  Left Great Toe Pain No signs of infection today. No other signs of inflammation. Unclear etiology, but likely related to recent toenail removal. Patient has area of hyperkeratosis over nailbed which may be the toenail growing back vs normal scarring s/p toenail removal. Will watch for now. Instructed patient if pain does not improve over the next few weeks or if it worsens, to return to the office.   Right Foot Laceration Well healed. No further intervention needed. Follow up as needed.   Other vitamin B12 deficiency anemia Month B12 injection given today.   Algis Greenhouse. Jerline Pain, [facility]  Resident PGY-2 01/28/2016 3:03 PM

## 2016-01-28 NOTE — Patient Instructions (Signed)
Your feet do not look infected. It is normal to have some pain after having your toenail removed. We do not need to make any changes.  We will give you your B12 shot today.  Please come back to see Dr Nori Riis soon.  Take care,  Dr Jerline Pain

## 2016-03-06 ENCOUNTER — Ambulatory Visit (INDEPENDENT_AMBULATORY_CARE_PROVIDER_SITE_OTHER): Payer: Medicare Other | Admitting: *Deleted

## 2016-03-06 ENCOUNTER — Telehealth: Payer: Self-pay | Admitting: *Deleted

## 2016-03-06 DIAGNOSIS — D518 Other vitamin B12 deficiency anemias: Secondary | ICD-10-CM | POA: Diagnosis present

## 2016-03-06 MED ORDER — NEBIVOLOL HCL 10 MG PO TABS: 10.0000 mg | ORAL_TABLET | ORAL | Status: DC

## 2016-03-06 MED ORDER — FUROSEMIDE 20 MG PO TABS: 10.0000 mg | ORAL_TABLET | Freq: Every day | ORAL | Status: DC

## 2016-03-06 MED ORDER — RAMIPRIL 2.5 MG PO CAPS: 2.5000 mg | ORAL_CAPSULE | Freq: Every morning | ORAL | Status: DC

## 2016-03-06 MED ORDER — TRAMADOL HCL 50 MG PO TABS: 50.0000 mg | ORAL_TABLET | Freq: Three times a day (TID) | ORAL | Status: DC | PRN

## 2016-03-06 MED ORDER — CYANOCOBALAMIN 1000 MCG/ML IJ SOLN
1000.0000 ug | Freq: Once | INTRAMUSCULAR | Status: AC
  Administered 2016-03-06: 1000 ug via INTRAMUSCULAR

## 2016-03-06 NOTE — Telephone Encounter (Signed)
Patient in nurse clinic and requested refills.  Derl Barrow, RN

## 2016-03-06 NOTE — Progress Notes (Signed)
   Patient in nurse clinic for his monthly Vitamin B-12 injection.  Patient is feeling well today, no concerns.  Cyanocobalamin 1000 mcg/mL IM given left deltoid.  Patient to call for next monthly appointment.  Patient requested a few refills for Tramadol, Lasix, Ramipril and Bystolic.  Derl Barrow, RN

## 2016-03-12 ENCOUNTER — Encounter: Payer: Self-pay | Admitting: Family Medicine

## 2016-03-12 ENCOUNTER — Ambulatory Visit (INDEPENDENT_AMBULATORY_CARE_PROVIDER_SITE_OTHER): Payer: Medicare Other | Admitting: Family Medicine

## 2016-03-12 ENCOUNTER — Telehealth: Payer: Self-pay | Admitting: Cardiology

## 2016-03-12 ENCOUNTER — Ambulatory Visit (INDEPENDENT_AMBULATORY_CARE_PROVIDER_SITE_OTHER): Payer: Medicare Other | Admitting: *Deleted

## 2016-03-12 VITALS — BP 164/74 | HR 92 | Temp 97.5°F | Ht 66.0 in | Wt 194.4 lb

## 2016-03-12 DIAGNOSIS — I10 Essential (primary) hypertension: Secondary | ICD-10-CM

## 2016-03-12 DIAGNOSIS — I495 Sick sinus syndrome: Secondary | ICD-10-CM

## 2016-03-12 DIAGNOSIS — M79675 Pain in left toe(s): Secondary | ICD-10-CM

## 2016-03-12 MED ORDER — TORSEMIDE 10 MG PO TABS: ORAL_TABLET | ORAL | Status: DC

## 2016-03-12 NOTE — Progress Notes (Signed)
Remote pacemaker transmission.   

## 2016-03-12 NOTE — Telephone Encounter (Signed)
Attempted to confirm remote transmission with pt. No answer and was unable to leave a message.   

## 2016-03-14 NOTE — Assessment & Plan Note (Signed)
His blood pressure is very stable. She's not having any episodes of dizziness. No medication changes.

## 2016-03-14 NOTE — Progress Notes (Signed)
   Subjective:    Patient ID: [name], male    DOB: [birth year], 82 y.o.   MRN: [id number]  HPI Still having a little bit of tenderness on the left great toe where we removed the toenail. He's little but worried about how it looks. Overall it's much improved from when he had the curved nail pinching it.  #2. He needs some refills and has some questions about some of his medicines.  Review of Systems Has noted no erythema or drainage from the nail area. No fever.    Objective:   Physical Exam  Vital signs are reviewed GENERAL: Well-developed elderly male no acute distress FEET: Area of the left great toenail is healing well. The skin is thickened over where the nailbed was but there is no drainage. There is no surrounding erythema. The nail on the right great toe has a lot of curvature there is no erythema or no drainage there.      Assessment & Plan:

## 2016-04-08 ENCOUNTER — Ambulatory Visit (INDEPENDENT_AMBULATORY_CARE_PROVIDER_SITE_OTHER): Payer: Medicare Other | Admitting: *Deleted

## 2016-04-08 ENCOUNTER — Other Ambulatory Visit: Payer: Self-pay | Admitting: *Deleted

## 2016-04-08 DIAGNOSIS — D518 Other vitamin B12 deficiency anemias: Secondary | ICD-10-CM | POA: Diagnosis present

## 2016-04-08 MED ORDER — RIVAROXABAN 15 MG PO TABS: 15.0000 mg | ORAL_TABLET | Freq: Every day | ORAL | Status: DC

## 2016-04-08 MED ORDER — CYANOCOBALAMIN 1000 MCG/ML IJ SOLN
1000.0000 ug | Freq: Once | INTRAMUSCULAR | Status: AC
  Administered 2016-04-08: 1000 ug via INTRAMUSCULAR

## 2016-04-08 NOTE — Progress Notes (Signed)
   Patient in nurse clinic for his monthly Vitamin B-12 injection.  Injection given left deltoid.  Patient to call clinic for next appointment.  Derl Barrow, RN

## 2016-04-23 ENCOUNTER — Encounter: Payer: Self-pay | Admitting: Cardiology

## 2016-04-23 LAB — CUP PACEART REMOTE DEVICE CHECK
Battery Remaining Longevity: 109 mo
Brady Statistic AS VS Percent: 0 %
Implantable Lead Implant Date: 20061128
Lead Channel Impedance Value: 640 Ohm
Lead Channel Pacing Threshold Amplitude: 0.625 V
Lead Channel Pacing Threshold Pulse Width: 0.4 ms
Lead Channel Setting Pacing Amplitude: 2 V
Lead Channel Setting Sensing Sensitivity: 4 mV
MDC IDC LEAD IMPLANT DT: 20061128
MDC IDC LEAD LOCATION: 753859
MDC IDC LEAD LOCATION: 753860
MDC IDC LEAD MODEL: 4592
MDC IDC MSMT BATTERY IMPEDANCE: 160 Ohm
MDC IDC MSMT BATTERY VOLTAGE: 2.79 V
MDC IDC MSMT LEADCHNL RA IMPEDANCE VALUE: 522 Ohm
MDC IDC MSMT LEADCHNL RA PACING THRESHOLD AMPLITUDE: 0.75 V
MDC IDC MSMT LEADCHNL RA PACING THRESHOLD PULSEWIDTH: 0.4 ms
MDC IDC SESS DTM: 20170426170232
MDC IDC SET LEADCHNL RV PACING AMPLITUDE: 2.5 V
MDC IDC SET LEADCHNL RV PACING PULSEWIDTH: 0.4 ms
MDC IDC STAT BRADY AP VP PERCENT: 98 %
MDC IDC STAT BRADY AP VS PERCENT: 0 %
MDC IDC STAT BRADY AS VP PERCENT: 2 %

## 2016-05-12 ENCOUNTER — Ambulatory Visit (INDEPENDENT_AMBULATORY_CARE_PROVIDER_SITE_OTHER): Payer: Medicare Other | Admitting: *Deleted

## 2016-05-12 DIAGNOSIS — D518 Other vitamin B12 deficiency anemias: Secondary | ICD-10-CM | POA: Diagnosis present

## 2016-05-12 MED ORDER — CYANOCOBALAMIN 1000 MCG/ML IJ SOLN
1000.0000 ug | Freq: Once | INTRAMUSCULAR | Status: AC
  Administered 2016-05-12: 1000 ug via INTRAMUSCULAR

## 2016-05-12 NOTE — Progress Notes (Signed)
Patient here today for monthly B12 injection for pernicious anemia.  1000 mcg given in left arm and patient tolerated injection well.  Burna Forts, BSN, RN-BC

## 2016-06-13 ENCOUNTER — Ambulatory Visit: Payer: Medicare Other | Admitting: Family Medicine

## 2016-06-24 ENCOUNTER — Inpatient Hospital Stay (HOSPITAL_COMMUNITY)
Admission: EM | Admit: 2016-06-24 | Discharge: 2016-07-02 | DRG: 389 | Disposition: A | Discharge status: Skilled Nursing Facility | Payer: Medicare Other | Attending: Family Medicine | Admitting: Family Medicine

## 2016-06-24 ENCOUNTER — Emergency Department (HOSPITAL_COMMUNITY): Payer: Medicare Other

## 2016-06-24 ENCOUNTER — Encounter (HOSPITAL_COMMUNITY): Payer: Self-pay | Admitting: Vascular Surgery

## 2016-06-24 DIAGNOSIS — M109 Gout, unspecified: Secondary | ICD-10-CM | POA: Diagnosis present

## 2016-06-24 DIAGNOSIS — R32 Unspecified urinary incontinence: Secondary | ICD-10-CM

## 2016-06-24 DIAGNOSIS — J9811 Atelectasis: Secondary | ICD-10-CM | POA: Diagnosis not present

## 2016-06-24 DIAGNOSIS — N179 Acute kidney failure, unspecified: Secondary | ICD-10-CM | POA: Diagnosis present

## 2016-06-24 DIAGNOSIS — I700 Atherosclerosis of aorta: Secondary | ICD-10-CM | POA: Diagnosis present

## 2016-06-24 DIAGNOSIS — I4891 Unspecified atrial fibrillation: Secondary | ICD-10-CM

## 2016-06-24 DIAGNOSIS — I480 Paroxysmal atrial fibrillation: Secondary | ICD-10-CM | POA: Diagnosis present

## 2016-06-24 DIAGNOSIS — K219 Gastro-esophageal reflux disease without esophagitis: Secondary | ICD-10-CM | POA: Diagnosis present

## 2016-06-24 DIAGNOSIS — K5660 Unspecified intestinal obstruction: Principal | ICD-10-CM | POA: Diagnosis present

## 2016-06-24 DIAGNOSIS — Z87891 Personal history of nicotine dependence: Secondary | ICD-10-CM

## 2016-06-24 DIAGNOSIS — Z85828 Personal history of other malignant neoplasm of skin: Secondary | ICD-10-CM

## 2016-06-24 DIAGNOSIS — E1122 Type 2 diabetes mellitus with diabetic chronic kidney disease: Secondary | ICD-10-CM | POA: Diagnosis present

## 2016-06-24 DIAGNOSIS — N183 Chronic kidney disease, stage 3 (moderate): Secondary | ICD-10-CM | POA: Diagnosis present

## 2016-06-24 DIAGNOSIS — Z7901 Long term (current) use of anticoagulants: Secondary | ICD-10-CM

## 2016-06-24 DIAGNOSIS — I130 Hypertensive heart and chronic kidney disease with heart failure and stage 1 through stage 4 chronic kidney disease, or unspecified chronic kidney disease: Secondary | ICD-10-CM | POA: Diagnosis present

## 2016-06-24 DIAGNOSIS — E861 Hypovolemia: Secondary | ICD-10-CM | POA: Diagnosis present

## 2016-06-24 DIAGNOSIS — Z96643 Presence of artificial hip joint, bilateral: Secondary | ICD-10-CM | POA: Diagnosis present

## 2016-06-24 DIAGNOSIS — I5032 Chronic diastolic (congestive) heart failure: Secondary | ICD-10-CM | POA: Diagnosis present

## 2016-06-24 DIAGNOSIS — E118 Type 2 diabetes mellitus with unspecified complications: Secondary | ICD-10-CM | POA: Diagnosis not present

## 2016-06-24 DIAGNOSIS — E538 Deficiency of other specified B group vitamins: Secondary | ICD-10-CM | POA: Diagnosis present

## 2016-06-24 DIAGNOSIS — R0602 Shortness of breath: Secondary | ICD-10-CM | POA: Diagnosis not present

## 2016-06-24 DIAGNOSIS — H409 Unspecified glaucoma: Secondary | ICD-10-CM | POA: Diagnosis present

## 2016-06-24 DIAGNOSIS — I10 Essential (primary) hypertension: Secondary | ICD-10-CM | POA: Diagnosis not present

## 2016-06-24 DIAGNOSIS — K5669 Other intestinal obstruction: Secondary | ICD-10-CM | POA: Diagnosis present

## 2016-06-24 DIAGNOSIS — E876 Hypokalemia: Secondary | ICD-10-CM | POA: Diagnosis not present

## 2016-06-24 DIAGNOSIS — Z950 Presence of cardiac pacemaker: Secondary | ICD-10-CM

## 2016-06-24 DIAGNOSIS — Z66 Do not resuscitate: Secondary | ICD-10-CM | POA: Diagnosis present

## 2016-06-24 DIAGNOSIS — N401 Enlarged prostate with lower urinary tract symptoms: Secondary | ICD-10-CM | POA: Diagnosis present

## 2016-06-24 DIAGNOSIS — E871 Hypo-osmolality and hyponatremia: Secondary | ICD-10-CM | POA: Diagnosis present

## 2016-06-24 DIAGNOSIS — I495 Sick sinus syndrome: Secondary | ICD-10-CM | POA: Diagnosis not present

## 2016-06-24 DIAGNOSIS — N400 Enlarged prostate without lower urinary tract symptoms: Secondary | ICD-10-CM | POA: Diagnosis not present

## 2016-06-24 DIAGNOSIS — E860 Dehydration: Secondary | ICD-10-CM | POA: Diagnosis present

## 2016-06-24 DIAGNOSIS — K56609 Unspecified intestinal obstruction, unspecified as to partial versus complete obstruction: Secondary | ICD-10-CM | POA: Diagnosis present

## 2016-06-24 LAB — CBC
HCT: 42.2 % (ref 39.0–52.0)
Hemoglobin: 13.8 g/dL (ref 13.0–17.0)
MCH: 29.6 pg (ref 26.0–34.0)
MCHC: 32.7 g/dL (ref 30.0–36.0)
MCV: 90.4 fL (ref 78.0–100.0)
PLATELETS: 199 10*3/uL (ref 150–400)
RBC: 4.67 MIL/uL (ref 4.22–5.81)
RDW: 14.5 % (ref 11.5–15.5)
WBC: 12.2 10*3/uL — AB (ref 4.0–10.5)

## 2016-06-24 LAB — COMPREHENSIVE METABOLIC PANEL
ALK PHOS: 50 U/L (ref 38–126)
ALT: 23 U/L (ref 17–63)
AST: 22 U/L (ref 15–41)
Albumin: 4.3 g/dL (ref 3.5–5.0)
Anion gap: 11 (ref 5–15)
BUN: 21 mg/dL — ABNORMAL HIGH (ref 6–20)
CALCIUM: 9.4 mg/dL (ref 8.9–10.3)
CO2: 24 mmol/L (ref 22–32)
CREATININE: 1.46 mg/dL — AB (ref 0.61–1.24)
Chloride: 97 mmol/L — ABNORMAL LOW (ref 101–111)
GFR calc non Af Amer: 40 mL/min — ABNORMAL LOW (ref >60)
GFR, EST AFRICAN AMERICAN: 46 mL/min — AB (ref >60)
Glucose, Bld: 146 mg/dL — ABNORMAL HIGH (ref 65–99)
Potassium: 4.5 mmol/L (ref 3.5–5.1)
Sodium: 132 mmol/L — ABNORMAL LOW (ref 135–145)
TOTAL PROTEIN: 7 g/dL (ref 6.5–8.1)
Total Bilirubin: 1 mg/dL (ref 0.3–1.2)

## 2016-06-24 LAB — LIPASE, BLOOD: LIPASE: 14 U/L (ref 11–51)

## 2016-06-24 MED ORDER — ONDANSETRON 4 MG PO TBDP
4.0000 mg | ORAL_TABLET | Freq: Once | ORAL | Status: AC | PRN
  Administered 2016-06-24: 4 mg via ORAL

## 2016-06-24 MED ORDER — FAMOTIDINE IN NACL 20-0.9 MG/50ML-% IV SOLN
20.0000 mg | Freq: Two times a day (BID) | INTRAVENOUS | Status: DC
  Administered 2016-06-25 – 2016-07-01 (×14): 20 mg via INTRAVENOUS
  Filled 2016-06-24 (×15): qty 50

## 2016-06-24 MED ORDER — ONDANSETRON HCL 4 MG/2ML IJ SOLN: 4.0000 mg | Freq: Four times a day (QID) | INTRAMUSCULAR | Status: DC | PRN

## 2016-06-24 MED ORDER — MORPHINE SULFATE (PF) 2 MG/ML IV SOLN: 2.0000 mg | INTRAVENOUS | Status: DC | PRN

## 2016-06-24 MED ORDER — SODIUM CHLORIDE 0.9 % IV SOLN: INTRAVENOUS | Status: DC

## 2016-06-24 MED ORDER — SODIUM CHLORIDE 0.9 % IV BOLUS (SEPSIS)
1000.0000 mL | Freq: Once | INTRAVENOUS | Status: AC
  Administered 2016-06-24: 1000 mL via INTRAVENOUS

## 2016-06-24 MED ORDER — DEXTROSE-NACL 5-0.9 % IV SOLN
INTRAVENOUS | Status: DC
  Filled 2016-06-24 (×2): qty 1000

## 2016-06-24 MED ORDER — SODIUM CHLORIDE 0.9 % IV SOLN
INTRAVENOUS | Status: DC
  Administered 2016-06-24: 22:00:00 via INTRAVENOUS

## 2016-06-24 MED ORDER — SODIUM CHLORIDE 0.9 % IV BOLUS (SEPSIS)
500.0000 mL | Freq: Once | INTRAVENOUS | Status: AC
  Administered 2016-06-24: 500 mL via INTRAVENOUS

## 2016-06-24 MED ORDER — HEPARIN SODIUM (PORCINE) 5000 UNIT/ML IJ SOLN
5000.0000 [IU] | Freq: Three times a day (TID) | INTRAMUSCULAR | Status: DC
  Administered 2016-06-24 – 2016-06-27 (×8): 5000 [IU] via SUBCUTANEOUS
  Filled 2016-06-24 (×8): qty 1

## 2016-06-24 MED ORDER — IOPAMIDOL (ISOVUE-300) INJECTION 61%
INTRAVENOUS | Status: AC
  Administered 2016-06-24: 100 mL
  Filled 2016-06-24: qty 100

## 2016-06-24 MED ORDER — ONDANSETRON 4 MG PO TBDP
ORAL_TABLET | ORAL | Status: AC
Start: 2016-06-24 — End: 2016-06-25
  Filled 2016-06-24: qty 1

## 2016-06-24 MED ORDER — METOPROLOL TARTRATE 5 MG/5ML IV SOLN
2.5000 mg | Freq: Two times a day (BID) | INTRAVENOUS | Status: DC
  Administered 2016-06-24 – 2016-06-26 (×5): 2.5 mg via INTRAVENOUS
  Filled 2016-06-24 (×5): qty 5

## 2016-06-24 NOTE — H&P (Signed)
[facility] Admission History and Physical Service Pager: [phone]  Patient name: [name] Medical record number: [id number] Date of birth: 02-Feb-1920 Age: 82 y.o. Gender: male  Primary Care Provider: [name], MD Consultants: none Code Status: DNR   Chief Complaint: nausea and abd distention  Assessment and Plan: [name] is a 82 y.o. male presenting with nausea and abd distention. PMH is significant for previous SBO, HTN, T2DM, GERD, gout, h/o diverticulosis, CKD Stage III, Afib on xarelto, BPH.  Partial small bowel obstruction. Seen on plain film and CT abd with mildly dilated left central abdominal small bowel loops, consistent with early or partial small bowel obstruction, no discrete transition point can be visualized. Has had previous hospitalizations for this, last 08/2015, has been told not surgical candidate d/t age. Still having BM today, nausea but no emesis. Endorses associated midepigastric heartburn. - Admit to inpatient, under Dr. Ree Kida - keep NPO for bowel rest -s/p 500 cc bolus in ED, will order 1L NS bolus and then initiate mIVF - consider NG tube placement for gastric decompression if worsens and begins to have significant emesis  - Zofran 21m IV q6h prn nausea - pain control morphine [id number] - IV Pepcid in place of home PPI   BPH: On rapaflo and has failed flomax in the past. Patient endorses difficulty with urination unless bears down hard or has BM. - Strict I/O -bladder scan for decreased UOP and may need foley catheter if retaining without home medication  -hold home Rapaflo given NPO status  CKD:  Cr on admit 1.46. Recent baseline Cr appears to be ~1.1. Mild elevation in Cr likely secondary to dehydration from poor PO intake.  - MIVF -AM BMET   Hyponatremia: Very mild with Na 132 at admission. Likely related to hypovolemia.  -correct with NS  -AM BMET   Afib at home on xarelto, multaq, nebivolol - IV  lopressor for rate control while NPO - hold xarelto, will order heparin for VTE prophylaxis >> risk of clot without anticoagulation in 1-2 days very low but if NPO for longer would consider increasing heparin to therapeutic dose   -hold home Multaq as there is no IV equivalent   HTN on ramipril at home. BP on admit 141/66 - monitor BP -holding ramipril while NPO   T2DM last a1c 12/06/14 was 6.0  H/o HFpEF Last echo was 2006 with preserved EF and currently euvolemic At home on torsemide mondays and fridays.   FEN/GI: D5NS@125 , NPO Prophylaxis: heparin  Disposition: pending  History of Present Illness:  [name] a [age].o. male presenting with nausea and abdominal distention. Daughter in law at bedside.  Patient has been complaining for 3-4 weeks that constipation was worsening with small stools and abdominal distention despite being compliant on daily miralax and metamucil. Had BM 4x yesterday and once today. Has had nausea with dry heaving but no emesis. Started having abdominal pain today, mostly upper but intermittent b/l lower abd pain. Last BM was tonight in ED prior to admission. Has had some bright red blood on toilet paper when wiping. Knows that belly is distended because pant size has increased in the last few day - size 44s fit him today (normally wears 42s). This episode is not as bad as previous admission for SBO, because last time had emesis. Endorses loss of appetite and decreased water intake. Intermittent heartburn. Has had chronic problems with urination.    Review Of Systems: Per HPI  with the following additions: No fevers/chills.  Otherwise the remainder of the systems were negative.  Patient Active Problem List   Diagnosis Date Noted  . Acquired deformity of nail 12/13/2015  . Diabetes mellitus due to underlying condition with stage 3 chronic kidney disease ([place]) 12/12/2015  . High risk social situation 10/04/2015  . Essential hypertension   . Pressure  ulcer, stage 2   . SBO (small bowel obstruction) ([place])   . Small bowel obstruction ([place])   . Gynecomastia, male 05/16/2015  . Shortness of breath on exertion 04/24/2015  . Second degree AV block 11/29/2014  . Dysphagia, pharyngoesophageal phase 09/07/2014  . Abnormal esophagram 09/07/2014  . Dysphagia 08/23/2014  . Symptomatic sinus bradycardia 03/14/2014  . Pacemaker syndrome 03/14/2014  . Volume overload 03/07/2014  . Hyperkalemia 03/03/2014  . Hematuria, gross 12/29/2013  . Acute abdominal pain in right lower quadrant 08/31/2013  . Pacemaker - Medtronic Adapta 2006, gen change 2015 07/23/2013  . Chronic right hip pain 09/22/2012  . Testicular swelling, right 09/22/2012  . Presbycusis 05/14/2012  . Hepatomegaly 01/30/2012  . Anticoagulant long-term use 01/30/2012  . PAF (paroxysmal atrial fibrillation) ([place]) 01/30/2012  . Hx SBO 01/30/2012  . S/P appendectomy 01/30/2012  . CHRONIC KIDNEY DISEASE STAGE III (MODERATE) 12/03/2010  . ALLERGIC RHINITIS 09/05/2009  . DYSPHAGIA UNSPECIFIED 05/29/2009  . HOARSENESS 05/15/2009  . HIP PAIN, LEFT 04/11/2009  . HIATAL HERNIA 12/14/2008  . Diverticulosis of large intestine 12/14/2008  . COLONIC POLYPS, HYPERPLASTIC, HX OF 12/14/2008  . ESOPHAGEAL STRICTURE 10/04/2008  . Other vitamin B12 deficiency anemia 01/26/2008  . GLAUCOMA 01/14/2007  . CATARACT 01/14/2007  . SICK SINUS SYNDROME 01/14/2007  . GASTROESOPHAGEAL REFLUX, NO ESOPHAGITIS 01/14/2007  . Benign prostatic hypertrophy (BPH) with incomplete bladder emptying 01/14/2007    Past Medical History: Past Medical History:  Diagnosis Date  . Acute gastritis without mention of hemorrhage   . Arthritis   . Atrial fibrillation ([place])   . Chronic kidney disease, stage III (moderate)   . Diverticulosis   . Diverticulosis of colon (without mention of hemorrhage)   . Environmental allergies    HISTORY OF  . Esophageal dysmotility   . Family history of malignant neoplasm of  gastrointestinal tract   . GERD (gastroesophageal reflux disease)   . Gout   . Hiatal hernia   . History of skin cancer   . Hypertension   . Hypertrophy of prostate with urinary obstruction and other lower urinary tract symptoms (LUTS)   . Overactive bladder   . Personal history of colonic polyps 10/21/2006   hyperplastic   . Presence of permanent cardiac pacemaker 10/14/05   medtronic Adapta  . Second degree AV block 11/29/2014  . Sinus node dysfunction ([facility])   . Small bowel obstruction ([place])   . Stricture and stenosis of esophagus   . Type II or unspecified type diabetes mellitus without mention of complication, not stated as uncontrolled    pt states that he checks his blood sugar once weekly, is not on oral meds or insulin  . Unspecified cataract   . Vitamin B12 deficiency     Past Surgical History: Past Surgical History:  Procedure Laterality Date  . APPENDECTOMY    . CARDIOVERSION  10/01/2011   Procedure: CARDIOVERSION;  Surgeon: Dani Gobble [name];  Location: [facility] OR;  Service: Cardiovascular;  Laterality: N/A;  OP CARDIOVERSION  . CARDIOVERSION  11/02/12   successful  . ESOPHAGOGASTRODUODENOSCOPY (EGD) WITH PROPOFOL N/A 09/22/2014   Procedure: ESOPHAGOGASTRODUODENOSCOPY (EGD) WITH PROPOFOL;  Surgeon: Jerene Bears, MD;  Location: [facility];  Service: Gastroenterology;  Laterality: N/A;  . EYE SURGERY Bilateral years ago   cataract lens replacment  . PACEMAKER INSERTION  replaced 2 months ago, total of 8 years with pacemaker  . PERMANENT PACEMAKER GENERATOR CHANGE N/A 03/14/2014   Procedure: PERMANENT PACEMAKER GENERATOR CHANGE;  Surgeon: Sanda Klein, MD; Medtronic Adapta L model number Siren, serial number [id number]   . SAVORY DILATION N/A 09/22/2014   Procedure: SAVORY DILATION;  Surgeon: Jerene Bears, MD;  Location: [facility];  Service: Gastroenterology;  Laterality: N/A;  . TONSILLECTOMY    . TOTAL HIP ARTHROPLASTY Bilateral     Social History: Social History   Substance Use Topics  . Smoking status: Former Smoker    Packs/day: 1.00    Years: 20.00    Types: Cigarettes    Quit date: 11/17/1969  . Smokeless tobacco: Never Used  . Alcohol use No     Comment: none since 1990   Additional social history: Lives alone.  Please also refer to relevant sections of EMR.  Family History: Family History  Problem Relation Age of Onset  . Parkinsonism Mother   . Hypertension Father   . Colon cancer Brother 67  . Hypertension Brother   . Hodgkin's lymphoma Grandchild     Allergies and Medications: Allergies  Allergen Reactions  . Codeine     REACTION: nausea   No current facility-administered medications on file prior to encounter.    Current Outpatient Prescriptions on File Prior to Encounter  Medication Sig Dispense Refill  . beta carotene w/minerals (OCUVITE) tablet Take 1 tablet by mouth every morning.     . Blood Glucose Monitoring Suppl (ONE TOUCH ULTRA SYSTEM KIT) w/Device KIT 1 kit by Does not apply route once. 1 each 0  . cephALEXin (KEFLEX) 500 MG capsule Take 1 capsule (500 mg total) by mouth 4 (four) times daily. 28 capsule 1  . Cyanocobalamin (VITAMIN B 12 PO) Take 1 capsule by mouth daily.    Marland Kitchen dronedarone (MULTAQ) 400 MG tablet Take 1 tablet (400 mg total) by mouth 2 (two) times daily with a meal. 180 tablet 1  . glucose blood test strip Use as instructed 100 each 12  . Lancets MISC Use with glucometer as directed 100 each 12  . nebivolol (BYSTOLIC) 10 MG tablet Take 1 tablet (10 mg total) by mouth every morning. 90 tablet 3  . pantoprazole (PROTONIX) 40 MG tablet Take 1 tablet (40 mg total) by mouth daily before breakfast. 90 tablet 3  . polyethylene glycol (MIRALAX / GLYCOLAX) packet Take 17 g by mouth daily. 14 each 0  . Probiotic Product (PHILLIPS COLON HEALTH PO) Take 1 tablet by mouth every morning.     . psyllium (HYDROCIL/METAMUCIL) 95 % PACK Take 1 packet by mouth daily. 56 each 2  . ramipril (ALTACE) 2.5 MG capsule  Take 1 capsule (2.5 mg total) by mouth every morning. 90 capsule 3  . Rivaroxaban (XARELTO) 15 MG TABS tablet Take 1 tablet (15 mg total) by mouth at bedtime. 90 tablet 3  . silodosin (RAPAFLO) 4 MG CAPS capsule Alternate taking 1 capsule daily with taking 2 daily 135 capsule 3  . torsemide (DEMADEX) 10 MG tablet One tablet twice a week, Mondays and Fridays. 28 tablet 3  . traMADol (ULTRAM) 50 MG tablet Take 1 tablet (50 mg total) by mouth every 8 (eight) hours as needed for moderate pain. 90 tablet 3    Objective: BP  129/59   Pulse 70   Temp 97.3 F (36.3 C) (Oral)   Resp 13   SpO2 99%  Exam: General: lying in bed , in NAD Eyes: EOMI ENTM: dry MM Cardiovascular: RRR, no murmurs noted, trace LE edema to ankles bilaterally, 2+ pedal pulses  Respiratory: CTAB, normal effort, no wheezes or crackles  Abdomen: distended, not TTP, soft, normoactive bs. No rebound/guarding MSK: moving limbs spontaneously Skin: no rashes noted Neuro: alert and oriented. 5/5 strength throughout. No focal deficits Psych: appropriate affect  Labs and Imaging: CBC BMET   Recent Labs Lab 06/24/16 1549  WBC 12.2*  HGB 13.8  HCT 42.2  PLT 199    Recent Labs Lab 06/24/16 1549  NA 132*  K 4.5  CL 97*  CO2 24  BUN 21*  CREATININE 1.46*  GLUCOSE 146*  CALCIUM 9.4     Ct Abdomen Pelvis W Contrast  Result Date: 06/24/2016 CLINICAL DATA:  Abdominal pain.  History of small bowel obstruction. EXAM: CT ABDOMEN AND PELVIS WITH CONTRAST TECHNIQUE: Multidetector CT imaging of the abdomen and pelvis was performed using the standard protocol following bolus administration of intravenous contrast. CONTRAST:  11m ISOVUE-300 IOPAMIDOL (ISOVUE-300) INJECTION 61% COMPARISON:  August 18, 2015 FINDINGS: A pacemaker device is identified, terminating in the right ventricle. Lung bases are otherwise unremarkable. No free air or free fluid. The stomach is distended, largely with fluid. There is a small bowel obstruction.  The transition point is thought to be in the right side of the abdomen but a discrete transition point is not visualized. The distal small bowel is decompressed. Colonic diverticulosis is seen without diverticulitis. The colon is smaller in caliber than the dilated small bowel. The appendix is surgically absent. The liver, gallbladder, portal vein, spleen, adrenal glands, and pancreas are normal. Several probable cysts in the kidneys, too small to characterize. Atherosclerotic changes seen in the non aneurysmal aorta. No adenopathy. No adenopathy or mass in the pelvis. The bladder is normal. The prostate and seminal vesicles are unremarkable. Delayed images through the upper abdomen demonstrate no filling defects in the upper renal collecting systems. Degenerative changes seen the spine with no suspicious bony abnormalities. The patient is status post left hip replacement. IMPRESSION: 1. Small bowel obstruction. While a discrete transition point is not seen, the transition point is likely in the right lower quadrant in the region of the prox ileum. This obstruction is either earlier partial. Electronically Signed   By: DDorise BullionIII M.D   On: 06/24/2016 19:28   Dg Abdomen Acute W/chest  Result Date: 06/24/2016 CLINICAL DATA:  Pt states nausea x 2 days, dry heaves without vomiting today, BM today, but belly is distended, pains entire mid abd, epigastric region x 2 days EXAM: DG ABDOMEN ACUTE W/ 1V CHEST COMPARISON:  08/30/2015 FINDINGS: Left-sided transvenous pacemaker leads to the right atrium and right ventricle. The heart is enlarged. Lungs are free of focal consolidations and pleural effusions. No free intraperitoneal air beneath the diaphragm. Supine and erect views of the abdomen demonstrate mildly dilated left central abdominal small bowel loops, consistent with early or partial small bowel obstruction. Gas is identified within nondilated loops of colon. Significant degenerative changes are identified  within the thoracolumbar spine. Status post left hip arthroplasty. Significant sclerosis and cystic change identified within the right hip. IMPRESSION: 1. Cardiomegaly. 2. No free intraperitoneal air. 3. Partial or early small bowel obstruction. Electronically Signed   By: ENolon NationsM.D.   On: 06/24/2016 16:52  Bufford Lope, DO 06/24/2016, 7:53 PM PGY-1, [place] Intern pager: [phone], text pages welcome  Upper Level Addendum:  I have seen and evaluated this patient along with Dr. Shawna Orleans and reviewed the above note, making necessary revisions in green.   Phill Myron, D.O. 06/24/2016, 10:50 PM PGY-2, [place]

## 2016-06-24 NOTE — ED Notes (Signed)
Pt to CT at this time via stretcher

## 2016-06-24 NOTE — ED Provider Notes (Signed)
[facility]-EMERGENCY DEPT Provider Note   CSN: [id number] Arrival date & time: 06/24/16  1516  First Provider Contact:  First MD Initiated Contact with Patient 06/24/16 1815        History   Chief Complaint Chief Complaint  Patient presents with  . Abdominal Pain  . Nausea    HPI [name] is a 82 y.o. male.  HPI Patient presents with one day of abdominal distention, pain, nausea and dry heaving. States he has a history of small bowel obstruction and symptoms are similar. Denies any fever or chills. Had small bowel movement earlier today. No blood in the stool. Past Medical History:  Diagnosis Date  . Acute gastritis without mention of hemorrhage   . Arthritis   . Atrial fibrillation (HCC)   . Chronic kidney disease, stage III (moderate)   . Diverticulosis   . Diverticulosis of colon (without mention of hemorrhage)   . Environmental allergies    HISTORY OF  . Esophageal dysmotility   . Family history of malignant neoplasm of gastrointestinal tract   . GERD (gastroesophageal reflux disease)   . Gout   . Hiatal hernia   . History of skin cancer   . Hypertension   . Hypertrophy of prostate with urinary obstruction and other lower urinary tract symptoms (LUTS)   . Overactive bladder   . Personal history of colonic polyps 10/21/2006   hyperplastic   . Presence of permanent cardiac pacemaker 10/14/05   medtronic Adapta  . Second degree AV block 11/29/2014  . Sinus node dysfunction (HCC)   . Small bowel obstruction (HCC)   . Stricture and stenosis of esophagus   . Type II or unspecified type diabetes mellitus without mention of complication, not stated as uncontrolled    pt states that he checks his blood sugar once weekly, is not on oral meds or insulin  . Unspecified cataract   . Vitamin B12 deficiency     Patient Active Problem List   Diagnosis Date Noted  . Acquired deformity of nail 12/13/2015  . Diabetes mellitus due to underlying condition with stage 3 chronic  kidney disease (HCC) 12/12/2015  . High risk social situation 10/04/2015  . Essential hypertension   . Pressure ulcer, stage 2   . SBO (small bowel obstruction) (HCC)   . Small bowel obstruction (HCC)   . Gynecomastia, male 05/16/2015  . Shortness of breath on exertion 04/24/2015  . Second degree AV block 11/29/2014  . Dysphagia, pharyngoesophageal phase 09/07/2014  . Abnormal esophagram 09/07/2014  . Dysphagia 08/23/2014  . Symptomatic sinus bradycardia 03/14/2014  . Pacemaker syndrome 03/14/2014  . Volume overload 03/07/2014  . Hyperkalemia 03/03/2014  . Hematuria, gross 12/29/2013  . Acute abdominal pain in right lower quadrant 08/31/2013  . Pacemaker - Medtronic Adapta 2006, gen change 2015 07/23/2013  . Chronic right hip pain 09/22/2012  . Testicular swelling, right 09/22/2012  . Presbycusis 05/14/2012  . Hepatomegaly 01/30/2012  . Anticoagulant long-term use 01/30/2012  . PAF (paroxysmal atrial fibrillation) (HCC) 01/30/2012  . Hx SBO 01/30/2012  . S/P appendectomy 01/30/2012  . CHRONIC KIDNEY DISEASE STAGE III (MODERATE) 12/03/2010  . ALLERGIC RHINITIS 09/05/2009  . DYSPHAGIA UNSPECIFIED 05/29/2009  . HOARSENESS 05/15/2009  . HIP PAIN, LEFT 04/11/2009  . HIATAL HERNIA 12/14/2008  . Diverticulosis of large intestine 12/14/2008  . COLONIC POLYPS, HYPERPLASTIC, HX OF 12/14/2008  . ESOPHAGEAL STRICTURE 10/04/2008  . Other vitamin B12 deficiency anemia 01/26/2008  . GLAUCOMA 01/14/2007  . CATARACT 01/14/2007  . SICK  SINUS SYNDROME 01/14/2007  . GASTROESOPHAGEAL REFLUX, NO ESOPHAGITIS 01/14/2007  . Benign prostatic hypertrophy (BPH) with incomplete bladder emptying 01/14/2007    Past Surgical History:  Procedure Laterality Date  . APPENDECTOMY    . CARDIOVERSION  10/01/2011   Procedure: CARDIOVERSION;  Surgeon: Dani Gobble [name];  Location: [facility] OR;  Service: Cardiovascular;  Laterality: N/A;  OP CARDIOVERSION  . CARDIOVERSION  11/02/12   successful  .  ESOPHAGOGASTRODUODENOSCOPY (EGD) WITH PROPOFOL N/A 09/22/2014   Procedure: ESOPHAGOGASTRODUODENOSCOPY (EGD) WITH PROPOFOL;  Surgeon: Jerene Bears, MD;  Location: [facility];  Service: Gastroenterology;  Laterality: N/A;  . EYE SURGERY Bilateral years ago   cataract lens replacment  . PACEMAKER INSERTION  replaced 2 months ago, total of 8 years with pacemaker  . PERMANENT PACEMAKER GENERATOR CHANGE N/A 03/14/2014   Procedure: PERMANENT PACEMAKER GENERATOR CHANGE;  Surgeon: Sanda Klein, MD; Medtronic Adapta L model number [place], serial number [id number]   . SAVORY DILATION N/A 09/22/2014   Procedure: SAVORY DILATION;  Surgeon: Jerene Bears, MD;  Location: [facility];  Service: Gastroenterology;  Laterality: N/A;  . TONSILLECTOMY    . TOTAL HIP ARTHROPLASTY Bilateral        Home Medications    Prior to Admission medications   Medication Sig Start Date End Date Taking? Authorizing Provider  beta carotene w/minerals (OCUVITE) tablet Take 1 tablet by mouth every morning.     Historical Provider, MD  Blood Glucose Monitoring Suppl (ONE TOUCH ULTRA SYSTEM KIT) w/Device KIT 1 kit by Does not apply route once. 12/12/15   Dickie La, MD  cephALEXin (KEFLEX) 500 MG capsule Take 1 capsule (500 mg total) by mouth 4 (four) times daily. 01/08/16   Dickie La, MD  Cyanocobalamin (VITAMIN B 12 PO) Take 1 capsule by mouth daily.    Historical Provider, MD  dronedarone (MULTAQ) 400 MG tablet Take 1 tablet (400 mg total) by mouth 2 (two) times daily with a meal. 01/14/16   Mihai [name], MD  glucose blood test strip Use as instructed 12/12/15   Dickie La, MD  Lancets MISC Use with glucometer as directed 12/12/15   Dickie La, MD  nebivolol (BYSTOLIC) 10 MG tablet Take 1 tablet (10 mg total) by mouth every morning. 03/06/16   Dickie La, MD  pantoprazole (PROTONIX) 40 MG tablet Take 1 tablet (40 mg total) by mouth daily before breakfast. 10/03/15   Zenia Resides, MD  polyethylene glycol (MIRALAX /  GLYCOLAX) packet Take 17 g by mouth daily. 08/30/15   Veatrice Bourbon, MD  Probiotic Product (PHILLIPS COLON HEALTH PO) Take 1 tablet by mouth every morning.     Historical Provider, MD  psyllium (HYDROCIL/METAMUCIL) 95 % PACK Take 1 packet by mouth daily. 08/30/15   Alyssa A Haney, MD  ramipril (ALTACE) 2.5 MG capsule Take 1 capsule (2.5 mg total) by mouth every morning. 03/06/16   Dickie La, MD  Rivaroxaban (XARELTO) 15 MG TABS tablet Take 1 tablet (15 mg total) by mouth at bedtime. 04/08/16   Dickie La, MD  silodosin (RAPAFLO) 4 MG CAPS capsule Alternate taking 1 capsule daily with taking 2 daily 01/16/16   Dickie La, MD  torsemide [facility]) 10 MG tablet One tablet twice a week, Mondays and Fridays. 03/12/16   Dickie La, MD  traMADol (ULTRAM) 50 MG tablet Take 1 tablet (50 mg total) by mouth every 8 (eight) hours as needed for moderate pain. 03/06/16   Dickie La, MD  Family History Family History  Problem Relation Age of Onset  . Parkinsonism Mother   . Hypertension Father   . Colon cancer Brother 19  . Hypertension Brother   . Hodgkin's lymphoma Grandchild     Social History Social History  Substance Use Topics  . Smoking status: Former Smoker    Packs/day: 1.00    Years: 20.00    Types: Cigarettes    Quit date: 11/17/1969  . Smokeless tobacco: Never Used  . Alcohol use No     Comment: none since 1990     Allergies   Codeine   Review of Systems Review of Systems  Constitutional: Negative for chills and fever.  Respiratory: Negative for shortness of breath.   Cardiovascular: Negative for chest pain.  Gastrointestinal: Positive for abdominal distention, abdominal pain and nausea. Negative for blood in stool, diarrhea and vomiting.  Musculoskeletal: Negative for back pain and neck pain.  Skin: Negative for rash.  Neurological: Negative for dizziness, weakness, light-headedness, numbness and headaches.  All other systems reviewed and are negative.    Physical  Exam Updated Vital Signs BP 146/69   Pulse 70   Temp 97.3 F (36.3 C) (Oral)   Resp 14   SpO2 99%   Physical Exam  Constitutional: He is oriented to person, place, and time. He appears well-developed and well-nourished. No distress.  HENT:  Head: Normocephalic and atraumatic.  Mouth/Throat: Oropharynx is clear and moist.  Eyes: EOM are normal. Pupils are equal, round, and reactive to light.  Neck: Normal range of motion. Neck supple.  Cardiovascular: Normal rate and regular rhythm.   Pulmonary/Chest: Effort normal and breath sounds normal. No respiratory distress. He has no wheezes. He has no rales. He exhibits no tenderness.  Abdominal: Soft. He exhibits distension. There is no tenderness. There is no rebound and no guarding.  Diminished bowel sounds  Musculoskeletal: Normal range of motion. He exhibits no edema or tenderness.  Neurological: He is alert and oriented to person, place, and time.  Heart appearing. Moves all extremities without deficit. Sensation fully intact.  Skin: Skin is warm and dry. No rash noted. No erythema.  Psychiatric: He has a normal mood and affect. His behavior is normal.  Nursing note and vitals reviewed.    ED Treatments / Results  Labs (all labs ordered are listed, but only abnormal results are displayed) Labs Reviewed  COMPREHENSIVE METABOLIC PANEL - Abnormal; Notable for the following:       Result Value   Sodium 132 (*)    Chloride 97 (*)    Glucose, Bld 146 (*)    BUN 21 (*)    Creatinine, Ser 1.46 (*)    GFR calc non Af Amer 40 (*)    GFR calc Af Amer 46 (*)    All other components within normal limits  CBC - Abnormal; Notable for the following:    WBC 12.2 (*)    All other components within normal limits  LIPASE, BLOOD  URINALYSIS, ROUTINE W REFLEX MICROSCOPIC (NOT AT [facility])    EKG  EKG Interpretation None       Radiology Ct Abdomen Pelvis W Contrast  Result Date: 06/24/2016 CLINICAL DATA:  Abdominal pain.  History of  small bowel obstruction. EXAM: CT ABDOMEN AND PELVIS WITH CONTRAST TECHNIQUE: Multidetector CT imaging of the abdomen and pelvis was performed using the standard protocol following bolus administration of intravenous contrast. CONTRAST:  ISOVUE-300 IOPAMIDOL (ISOVUE-300) INJECTION 61% COMPARISON:  August 18, 2015 FINDINGS: A pacemaker  device is identified, terminating in the right ventricle. Lung bases are otherwise unremarkable. No free air or free fluid. The stomach is distended, largely with fluid. There is a small bowel obstruction. The transition point is thought to be in the right side of the abdomen but a discrete transition point is not visualized. The distal small bowel is decompressed. Colonic diverticulosis is seen without diverticulitis. The colon is smaller in caliber than the dilated small bowel. The appendix is surgically absent. The liver, gallbladder, portal vein, spleen, adrenal glands, and pancreas are normal. Several probable cysts in the kidneys, too small to characterize. Atherosclerotic changes seen in the non aneurysmal aorta. No adenopathy. No adenopathy or mass in the pelvis. The bladder is normal. The prostate and seminal vesicles are unremarkable. Delayed images through the upper abdomen demonstrate no filling defects in the upper renal collecting systems. Degenerative changes seen the spine with no suspicious bony abnormalities. The patient is status post left hip replacement. IMPRESSION: 1. Small bowel obstruction. While a discrete transition point is not seen, the transition point is likely in the right lower quadrant in the region of the prox ileum. This obstruction is either earlier partial. Electronically Signed   By: Dorise Bullion III M.D   On: 06/24/2016 19:28   Dg Abdomen Acute W/chest  Result Date: 06/24/2016 CLINICAL DATA:  Pt states nausea x 2 days, dry heaves without vomiting today, BM today, but belly is distended, pains entire mid abd, epigastric region x 2 days  EXAM: DG ABDOMEN ACUTE W/ 1V CHEST COMPARISON:  08/30/2015 FINDINGS: Left-sided transvenous pacemaker leads to the right atrium and right ventricle. The heart is enlarged. Lungs are free of focal consolidations and pleural effusions. No free intraperitoneal air beneath the diaphragm. Supine and erect views of the abdomen demonstrate mildly dilated left central abdominal small bowel loops, consistent with early or partial small bowel obstruction. Gas is identified within nondilated loops of colon. Significant degenerative changes are identified within the thoracolumbar spine. Status post left hip arthroplasty. Significant sclerosis and cystic change identified within the right hip. IMPRESSION: 1. Cardiomegaly. 2. No free intraperitoneal air. 3. Partial or early small bowel obstruction. Electronically Signed   By: Nolon Nations M.D.   On: 06/24/2016 16:52    Procedures Procedures (including critical care time)  Medications Ordered in ED Medications  ondansetron (ZOFRAN-ODT) disintegrating tablet 4 mg (4 mg Oral Given 06/24/16 1547)  sodium chloride 0.9 % bolus 500 mL (500 mLs Intravenous New Bag/Given 06/24/16 1845)  iopamidol (ISOVUE-300) 61 % injection (100 mLs  Contrast Given 06/24/16 1858)     Initial Impression / Assessment and Plan / ED Course  I have reviewed the triage vital signs and the nursing notes.  Pertinent labs & imaging results that were available during my care of the patient were reviewed by me and considered in my medical decision making (see chart for details).  Clinical Course    Discussed with family medicine resident. Will see patient in the emergency department and admit. Patient's vital signs remained stable.  Final Clinical Impressions(s) / ED Diagnoses   Final diagnoses:  SBO (small bowel obstruction) ([place])    New Prescriptions New Prescriptions   No medications on file     Julianne Rice, MD 06/24/16 2031

## 2016-06-24 NOTE — ED Triage Notes (Signed)
Pt reports to the ED for eval of abd pain, distention, and nausea. Pt denies any active vomiting. Took a laxative yesterday and had 4 BMs. None today. Has hx of SBO and states this feels similar. Pt A&Ox4, resp e/u, and skin warm and dry.

## 2016-06-25 ENCOUNTER — Ambulatory Visit: Payer: Medicare Other | Admitting: Family Medicine

## 2016-06-25 ENCOUNTER — Telehealth: Payer: Self-pay | Admitting: Family Medicine

## 2016-06-25 DIAGNOSIS — I10 Essential (primary) hypertension: Secondary | ICD-10-CM

## 2016-06-25 DIAGNOSIS — R32 Unspecified urinary incontinence: Secondary | ICD-10-CM

## 2016-06-25 DIAGNOSIS — N400 Enlarged prostate without lower urinary tract symptoms: Secondary | ICD-10-CM

## 2016-06-25 DIAGNOSIS — K5669 Other intestinal obstruction: Secondary | ICD-10-CM

## 2016-06-25 DIAGNOSIS — I480 Paroxysmal atrial fibrillation: Secondary | ICD-10-CM

## 2016-06-25 LAB — BASIC METABOLIC PANEL
ANION GAP: 7 (ref 5–15)
BUN: 18 mg/dL (ref 6–20)
CHLORIDE: 105 mmol/L (ref 101–111)
CO2: 24 mmol/L (ref 22–32)
CREATININE: 1.14 mg/dL (ref 0.61–1.24)
Calcium: 8.5 mg/dL — ABNORMAL LOW (ref 8.9–10.3)
GFR calc non Af Amer: 53 mL/min — ABNORMAL LOW (ref >60)
Glucose, Bld: 128 mg/dL — ABNORMAL HIGH (ref 65–99)
POTASSIUM: 4.4 mmol/L (ref 3.5–5.1)
SODIUM: 136 mmol/L (ref 135–145)

## 2016-06-25 LAB — CBC
HCT: 38.4 % — ABNORMAL LOW (ref 39.0–52.0)
Hemoglobin: 12.6 g/dL — ABNORMAL LOW (ref 13.0–17.0)
MCH: 30.2 pg (ref 26.0–34.0)
MCHC: 32.8 g/dL (ref 30.0–36.0)
MCV: 92.1 fL (ref 78.0–100.0)
PLATELETS: 137 10*3/uL — AB (ref 150–400)
RBC: 4.17 MIL/uL — ABNORMAL LOW (ref 4.22–5.81)
RDW: 14.8 % (ref 11.5–15.5)
WBC: 5.8 10*3/uL (ref 4.0–10.5)

## 2016-06-25 LAB — OCCULT BLOOD X 1 CARD TO LAB, STOOL: FECAL OCCULT BLD: NEGATIVE

## 2016-06-25 MED ORDER — DEXTROSE-NACL 5-0.9 % IV SOLN
INTRAVENOUS | Status: DC
  Administered 2016-06-25 – 2016-06-26 (×2): via INTRAVENOUS
  Administered 2016-06-26: 125 mL/h via INTRAVENOUS
  Administered 2016-06-26 – 2016-06-29 (×4): via INTRAVENOUS

## 2016-06-25 NOTE — Progress Notes (Signed)
CALL PAGER [phone] for any questions or notifications regarding this patient  FMTS Attending Note: [name] MD Mr. Fok is my continuity patient. He is much improved since admission, tolerating ice chips today. It looks like we will be able to avoid the NGT unless his course changes, and he is very happy  About that. We discussed inciting events and there seem to be none. He is taking his fiber regularly.I appreciate the excellent care given by our inpatient team.

## 2016-06-25 NOTE — Telephone Encounter (Signed)
Pt's family just wanted to let Dr. Nori Riis know the pt has been admitted to the hospital. ep

## 2016-06-25 NOTE — Telephone Encounter (Signed)
PCP is aware of this, she mentioned it to me this morning. Katharina Caper, April D, CMA d

## 2016-06-25 NOTE — Progress Notes (Signed)
Family Medicine Teaching Service Daily Progress Note Intern Pager: [phone]  Patient name: [name] Medical record number: [id number] Date of birth: 03-15-1920 Age: 82 y.o. Gender: male  Primary Care Provider: [name], MD Consultants: none Code Status: DNR  Pt Overview and Major Events to Date:  Admit 8/8  Assessment and Plan:  [name] is a 82 y.o. male presenting with nausea and abd distention. PMH is significant for previous SBO, hx of esophageal strictures, HTN, T2DM, GERD, gout, h/o diverticulosis, CKD Stage III, Afib on xarelto, BPH.  Partial small bowel obstruction. Seen on plain film and CT abd with mildly dilated left central abdominal small bowel loops, consistent with early or partial small bowel obstruction, no discrete transition point can be visualized. Has had previous hospitalizations for this, last 08/2015, has been told not surgical candidate d/t age. Still having BM today, nausea but no emesis. Endorses associated midepigastric heartburn. - keep NPO for bowel rest, can give ice chips. No soft food till tomorrow 8/10 -s/p 500 cc bolus in ED and 1L NS bolus, now on D5 NS @125  mL/hr - consider NG tube placement for gastric decompression if worsens and begins to have significant emesis  - Zofran 4mg  IV q6h prn nausea - pain control morphine 2mg  q4prn - IV Pepcid in place of home PPI  -Will need close follow up with GI -patient has noted some blood in stool hemorrhoids vs GI bleeding:FOBT performed and sent to lab.  -DRE shows no visible rectal bleeding, no signs of hemorrhoids.   BPH: On rapaflo and has failed flomax in the past. Patient endorses difficulty with urination unless bears down hard or has BM. - Strict I/O -watch closely for urinary retention given significant BPH -bladder scan ordered for decreased UOP: shows no urine in bladder. No cath needed for now. -may need foley catheter if retaining without home medication  -hold home Rapaflo given  NPO status  CKD:  Cr on admit 1.46. Improved to 1.14. Recent baseline Cr appears to be ~1.1. Mild elevation in Cr likely secondary to dehydration from poor PO intake.  - continue MIVF -AM BMET: wnl  Hyponatremia: Very mild with Na 132 at admission. Likely related to hypovolemia.  -correct with NS  -AM BMET shows normal Na+ of 136  Afib at home on xarelto, multaq, nebivolol - IV lopressor for rate control while NPO - hold xarelto, will order heparin for VTE prophylaxis >> risk of clot without anticoagulation in 1-2 days very low but if NPO for longer would consider increasing heparin to therapeutic dose   -hold home Multaq as there is no IV equivalent  -oral blood thinners once diet advanced  HTN on ramipril at home. BP on admit 141/66 - monitor BP, most recent BPs ~120s/60 -holding ramipril while NPO   T2DM last a1c 12/06/14 was 6.0  H/o HFpEF Last echo was 2006 with preserved EF and currently euvolemic At home on torsemide mondays and fridays.   FEN/GI: D5NS@125 , NPO Prophylaxis: heparin  Disposition: admitted to [facility] teaching service  General: sitting in chair, pleasant, in NAD Eyes: EOMI ENTM: dry MM, hard of hearing Cardiovascular: RRR, no murmurs noted, trace LE edema to ankles bilaterally, 2+ pedal pulses  Respiratory: CTAB, normal effort, no wheezes or crackles  Abdomen: distended, not TTP, some RUQ pain, normoactive bs. No rebound/guarding MSK: moving limbs spontaneously Skin: no rashes noted Neuro: alert and oriented. 5/5 strength throughout. No focal deficits Psych: appropriate mood and affect  Objective: Temp:  [97.3  F (36.3 C)-98.6 F (37 C)] 98 F (36.7 C) (08/09 0548) Pulse Rate:  [69-77] 71 (08/09 0841) Resp:  [13-18] 18 (08/09 0548) BP: (108-164)/(55-72) 164/72 (08/09 0841) SpO2:  [96 %-100 %] 98 % (08/09 0548) Weight:  [189 lb 3.2 oz (85.8 kg)] 189 lb 3.2 oz (85.8 kg) (08/08 2121)   Laboratory:  Recent Labs Lab 06/24/16 1549  06/25/16 0606  WBC 12.2* 5.8  HGB 13.8 12.6*  HCT 42.2 38.4*  PLT 199 137*    Recent Labs Lab 06/24/16 1549 06/25/16 0606  NA 132* 136  K 4.5 4.4  CL 97* 105  CO2 24 24  BUN 21* 18  CREATININE 1.46* 1.14  CALCIUM 9.4 8.5*  PROT 7.0  --   BILITOT 1.0  --   ALKPHOS 50  --   ALT 23  --   AST 22  --   GLUCOSE 146* 128*    Imaging/Diagnostic Tests:  Ct Abdomen Pelvis W Contrast  Result Date: 06/24/2016 CLINICAL DATA:  Abdominal pain.  History of small bowel obstruction. EXAM: CT ABDOMEN AND PELVIS WITH CONTRAST TECHNIQUE: Multidetector CT imaging of the abdomen and pelvis was performed using the standard protocol following bolus administration of intravenous contrast. CONTRAST:  190mL ISOVUE-300 IOPAMIDOL (ISOVUE-300) INJECTION 61% COMPARISON:  August 18, 2015 FINDINGS: A pacemaker device is identified, terminating in the right ventricle. Lung bases are otherwise unremarkable. No free air or free fluid. The stomach is distended, largely with fluid. There is a small bowel obstruction. The transition point is thought to be in the right side of the abdomen but a discrete transition point is not visualized. The distal small bowel is decompressed. Colonic diverticulosis is seen without diverticulitis. The colon is smaller in caliber than the dilated small bowel. The appendix is surgically absent. The liver, gallbladder, portal vein, spleen, adrenal glands, and pancreas are normal. Several probable cysts in the kidneys, too small to characterize. Atherosclerotic changes seen in the non aneurysmal aorta. No adenopathy. No adenopathy or mass in the pelvis. The bladder is normal. The prostate and seminal vesicles are unremarkable. Delayed images through the upper abdomen demonstrate no filling defects in the upper renal collecting systems. Degenerative changes seen the spine with no suspicious bony abnormalities. The patient is status post left hip replacement. IMPRESSION: 1. Small bowel obstruction.  While a discrete transition point is not seen, the transition point is likely in the right lower quadrant in the region of the prox ileum. This obstruction is either earlier partial. Electronically Signed   By: Dorise Bullion III M.D   On: 06/24/2016 19:28   Dg Abdomen Acute W/chest  Result Date: 06/24/2016 CLINICAL DATA:  Pt states nausea x 2 days, dry heaves without vomiting today, BM today, but belly is distended, pains entire mid abd, epigastric region x 2 days EXAM: DG ABDOMEN ACUTE W/ 1V CHEST COMPARISON:  08/30/2015 FINDINGS: Left-sided transvenous pacemaker leads to the right atrium and right ventricle. The heart is enlarged. Lungs are free of focal consolidations and pleural effusions. No free intraperitoneal air beneath the diaphragm. Supine and erect views of the abdomen demonstrate mildly dilated left central abdominal small bowel loops, consistent with early or partial small bowel obstruction. Gas is identified within nondilated loops of colon. Significant degenerative changes are identified within the thoracolumbar spine. Status post left hip arthroplasty. Significant sclerosis and cystic change identified within the right hip. IMPRESSION: 1. Cardiomegaly. 2. No free intraperitoneal air. 3. Partial or early small bowel obstruction. Electronically Signed   By:  Nolon Nations M.D.   On: 06/24/2016 16:52    Lovenia Kim, MD 06/25/2016, 10:59 AM PGY-1, [place] Intern pager: [phone], text pages welcome

## 2016-06-25 NOTE — Progress Notes (Signed)
Patient bladder scanned- residual 141cc. Will continue to monitor

## 2016-06-25 NOTE — Progress Notes (Signed)
Patient's b/p reported to MD at this time- RN told to just monitor patient. Patient is asymptomatic at this time.

## 2016-06-25 NOTE — Progress Notes (Signed)
Patient bladder scanned- MD by bedside. Residual 0. Will bladder scan patient in about 4 hours again.

## 2016-06-26 DIAGNOSIS — N179 Acute kidney failure, unspecified: Secondary | ICD-10-CM

## 2016-06-26 DIAGNOSIS — I4891 Unspecified atrial fibrillation: Secondary | ICD-10-CM

## 2016-06-26 LAB — BASIC METABOLIC PANEL
ANION GAP: 5 (ref 5–15)
BUN: 10 mg/dL (ref 6–20)
CALCIUM: 8.4 mg/dL — AB (ref 8.9–10.3)
CO2: 26 mmol/L (ref 22–32)
Chloride: 107 mmol/L (ref 101–111)
Creatinine, Ser: 1.08 mg/dL (ref 0.61–1.24)
GFR, EST NON AFRICAN AMERICAN: 57 mL/min — AB (ref >60)
Glucose, Bld: 126 mg/dL — ABNORMAL HIGH (ref 65–99)
POTASSIUM: 4.3 mmol/L (ref 3.5–5.1)
SODIUM: 138 mmol/L (ref 135–145)

## 2016-06-26 LAB — CBC
HCT: 38 % — ABNORMAL LOW (ref 39.0–52.0)
Hemoglobin: 12.1 g/dL — ABNORMAL LOW (ref 13.0–17.0)
MCH: 29.3 pg (ref 26.0–34.0)
MCHC: 31.8 g/dL (ref 30.0–36.0)
MCV: 92 fL (ref 78.0–100.0)
PLATELETS: 137 10*3/uL — AB (ref 150–400)
RBC: 4.13 MIL/uL — AB (ref 4.22–5.81)
RDW: 14.8 % (ref 11.5–15.5)
WBC: 6.7 10*3/uL (ref 4.0–10.5)

## 2016-06-26 MED ORDER — TRAMADOL HCL 50 MG PO TABS
100.0000 mg | ORAL_TABLET | Freq: Two times a day (BID) | ORAL | Status: DC | PRN
  Administered 2016-06-26: 100 mg via ORAL
  Filled 2016-06-26: qty 2

## 2016-06-26 NOTE — Discharge Summary (Signed)
Family Medicine Teaching [facility] Discharge Summary  Patient name: [name] Medical record number: [id number] Date of birth: 12-19-1919 Age: 82 y.o. Gender: male Date of Admission: 06/24/2016  Date of Discharge: 07/02/2016 Admitting Physician: Uvaldo Rising, MD  Primary Care Provider: Denny Levy, MD Consultants: None  Indication for Hospitalization: nausea and abdominal distention  Discharge Diagnoses/Problem List:  SBO, hx esophageal strictures, HTN, T2DM, GERD, gout, h/o diverticulosis, CKD Stage III, Afib on Xarelto, BPH  Disposition: SNF  Discharge Condition: Stable, improved.  Discharge Exam:  General: sitting in chair, pleasant, in NAD Eyes: EOMI ENTM: dry MM, hard of hearing Cardiovascular: RRR, no murmurs noted, trace LE edema to ankles bilaterally Respiratory: CTAB, normal effort, no wheezes or crackles  Abdomen: less tense and distended than yesterday, soft, nontender, +BS concern for some element of high-pitched quality  Skin: no rashes noted Extremities: no LE edema Neuro: AOx3 Psych: appropriate mood and affect   Brief Hospital Course:  [name] is a 82 yo M who presented with nausea and abdominal distention. PMH significant for previous SBO, esophageal strictures, HTN, T2DM, GERD, gout, h/o diverticulosis, Stage III CK, Afib, and BPH.   Partial SBO Patient was complaining of abdominal pain and distention. Plain film and CT abdomen mildly dilated left central abdominal small bowel loops, consistent with early or partial small bowel obstruction. He had had previous hospitalizations for this and was told he is not a surgical candidate due to age. He was put NPO for bowel rest during hospitalization and monitored for abdominal pain. He continued to have BMs and nausea but no emesis. FOBT negative and DRE showing no visible rectal bleeding or signs of hemorrhoids. He was given fluids, Zofran and pain control. At time of discharge, patient was stable and no  longer complaining of abdominal pain.  BPH Patient has history of significant BPH. On Rapaflo and has failed Flomax in the past. He endorses difficulty with urination but is leaking urine. Bladder scan showed no urine in bladder. No catherization was required during admission. He was watched closely for urinary retention given his hx of significant BPH. Home Rapaflow was held during hospitalization due to NPO status and restarted once tolerating PO.  CKD Patient with Cr on admission 1.46. Recent baseline Cr appears to be ~1.1. Mild elevation likely secondary to dehydration from poor PO intake. Continued IVFs and followed BMP while admitted. At time of discharge, Cr was 1.18.  HTN On ramipril at home. BP on admission 141/66. BP was monitored, antihypertensives were held while NPO and were restarted once tolerating PO.  H/o HFpEF  Stable while admitted. At home on Torsemide Mondays and Fridays. Pt receiving IV lasix while admitted, was transitioned to torsemide and diuresed well. Last echo was 2006 with preserved EF and currently euvolemic.  Issues for Follow Up:  1. Close follow up with GI required upon discharge. Dr. Rhea Belton appointment 8/18 2. PCP appointment 8/16 with Dr. Jennette Kettle 3. Calcified aortic atherosclerosis and ectatic abdominal aorta at risk for abdominal aortic aneurysm development. Follow up US recommended in 5 years.  Significant Procedures: None  Significant Labs and Imaging:   Recent Labs Lab 06/30/16 0046 07/01/16 0503 07/02/16 0511  WBC 9.4 5.7 6.7  HGB 12.6* 10.9* 11.1*  HCT 37.0* 33.1* 33.7*  PLT 150 148* 155    Recent Labs Lab 06/28/16 1033 06/29/16 0545 06/30/16 0046 07/01/16 0503 07/02/16 0511  NA 134* 135 130* 135 134*  K 3.4* 3.3* 3.3* 3.1* 3.5  CL 100* 103  99* 100* 101  CO2 '23 23 24 27 26  '$ GLUCOSE 110* 102* 122* 104* 105*  BUN '9 10 9 11 12  '$ CREATININE 1.06 0.99 1.04 1.12 1.18  CALCIUM 8.5* 8.2* 8.4* 8.4* 8.3*    Results/Tests Pending at Time  of Discharge:  NONE  Discharge Medications:    Medication List    STOP taking these medications   cephALEXin 500 MG capsule Commonly known as:  KEFLEX     TAKE these medications   beta carotene w/minerals tablet Take 1 tablet by mouth every morning.   dronedarone 400 MG tablet Commonly known as:  MULTAQ Take 1 tablet (400 mg total) by mouth 2 (two) times daily with a meal.   glucose blood test strip Use as instructed   Lancets Misc Use with glucometer as directed   nebivolol 10 MG tablet Commonly known as:  BYSTOLIC Take 1 tablet (10 mg total) by mouth every morning.   ONE TOUCH ULTRA SYSTEM KIT w/Device Kit 1 kit by Does not apply route once.   pantoprazole 40 MG tablet Commonly known as:  PROTONIX Take 1 tablet (40 mg total) by mouth daily before breakfast.   PHILLIPS COLON HEALTH PO Take 1 tablet by mouth every morning.   polyethylene glycol packet Commonly known as:  MIRALAX / GLYCOLAX Take 17 g by mouth daily.   psyllium 95 % Pack Commonly known as:  HYDROCIL/METAMUCIL Take 1 packet by mouth daily.   ramipril 2.5 MG capsule Commonly known as:  ALTACE Take 1 capsule (2.5 mg total) by mouth every morning.   Rivaroxaban 15 MG Tabs tablet Commonly known as:  XARELTO Take 1 tablet (15 mg total) by mouth at bedtime.   silodosin 4 MG Caps capsule Commonly known as:  RAPAFLO Alternate taking 1 capsule daily with taking 2 daily   SYSTANE FREE OP Place 1 drop into both eyes daily as needed. For dry eyes   torsemide 10 MG tablet Commonly known as:  DEMADEX One tablet twice a week, Mondays and Fridays.   traMADol 50 MG tablet Commonly known as:  ULTRAM Take 1 tablet (50 mg total) by mouth every 8 (eight) hours as needed for moderate pain.   VITAMIN B 12 PO Take 1 capsule by mouth daily.       Discharge Instructions: Please refer to Patient Instructions section of EMR for full details.  Patient was counseled important signs and symptoms that should  prompt return to medical care, changes in medications, dietary instructions, activity restrictions, and follow up appointments.   Follow-Up Appointments: Follow-up Information    Jerene Bears, MD Follow up on 07/04/2016.   Specialty:  Gastroenterology Why:  1.30 pm. Contact information: [address] [place] [address] [phone]        [name], MD Follow up on 07/02/2016.   Specialties:  Family Medicine, Sports Medicine Why:  10.15 am Contact information: [address] [place] [address] [phone]           Lovenia Kim, MD 07/02/2016, 12:17 PM PGY-1, [place]

## 2016-06-26 NOTE — Progress Notes (Signed)
Family Medicine Teaching Service Daily Progress Note Intern Pager: [phone]  Patient name: [name] Medical record number: [id number] Date of birth: 03/29/1920 Age: 82 y.o. Gender: male  Primary Care Provider: [name], MD Consultants: none Code Status: DNR  Pt Overview and Major Events to Date:  Admit 8/8  Assessment and Plan: [name] is a 82 y.o. male presenting with nausea and abd distention. PMH is significant for previous SBO, hx of esophageal strictures, HTN, T2DM, GERD, gout, h/o diverticulosis, CKD Stage III, Afib on xarelto, BPH.  Partial small bowel obstruction. Seen on plain film and CT abd with mildly dilated left central abdominal small bowel loops, consistent with early or partial small bowel obstruction. Has had previous hospitalizations for this, last 08/2015, has been told not surgical candidate d/t age. Still having BMs, nausea but no emesis. Patient has noted some blood in stool hemorrhoids vs GI bleeding:FOBT negative. DRE shows no visible rectal bleeding, no signs of hemorrhoids.  -Transitioned to clear liquid diet today 8/10 - D5 NS @125  mL/hr - consider NG tube placement for gastric decompression if worsens and begins to have significant emesis  - Zofran 4mg  IV q6h prn nausea - pain control morphine 2mg  q4prn - IV Pepcid in place of home PPI  -Will need close follow up with GI - Dr. Hilarie Fredrickson (seen in Feb). Called office for outpatient follow-up care, appointment for 8/18 1.30pm.  BPH: On rapaflo and has failed flomax in the past. Patient endorses difficulty with urination unless bears down hard or has BM.  -bladder scan shows no urine in bladder. No cath needed for now.  - Strict I/O -watch closely for urinary retention given significant BPH -may need foley catheter if retaining without home medication  -hold home Rapaflo given NPO status  CKD:  Cr on admission 1.46. Improved to 1.08. Recent baseline Cr appears to be ~1.1. Mild elevation likely  secondary to dehydration from poor PO intake.  - continue MIVF -AM BMP: wnl  Hyponatremia: Resolved. Am Na 138. Very mild with Na 132 at admission. Likely related to hypovolemia.  -correct with NS   Afib at home on xarelto, multaq, nebivolol - IV lopressor for rate control while NPO - hold xarelto, will order heparin for VTE prophylaxis >> risk of clot without anticoagulation in 1-2 days very low but if NPO for longer would consider increasing heparin to therapeutic dose   -hold home Multaq as there is no IV equivalent  -oral blood thinners once diet advanced  HTN on ramipril at home. BP on admit 141/66 - monitor BP, most recent BPs ~160s/70s -holding ramipril while NPO, consider adding antihypertensive on board once tolerating soft diet  T2DM last a1c 12/06/14 was 6.0  H/o HFpEF Last echo was 2006 with preserved EF and currently euvolemic At home on torsemide mondays and fridays.  FEN/GI: D5NS@125 , NPO Prophylaxis: heparin  Disposition: [facility] to [facility] teaching service  General: sitting in chair, pleasant, in NAD Eyes: EOMI ENTM: dry MM, hard of hearing Cardiovascular: RRR, no murmurs noted, trace LE edema to ankles bilaterally, 2+ pedal pulses  Respiratory: CTAB, normal effort, no wheezes or crackles  Abdomen: distended, not TTP, some RUQ pain, normoactive bs. No rebound/guarding MSK: moving limbs spontaneously Skin: no rashes noted Neuro: alert and oriented. 5/5 strength throughout. No focal deficits Psych: appropriate mood and affect  Subjective: Patient continues to have RUQ abdominal pain and states he has a bad hip so that is causing some pain. Mostly on moving around or  getting out of his chair. Has been leaking urine due to his BPH. Last BM yesterday am.  Objective: Temp:  [97.2 F (36.2 C)-98.2 F (36.8 C)] 98.2 F (36.8 C) (08/10 0500) Pulse Rate:  [70-75] 70 (08/10 0500) Resp:  [18] 18 (08/09 1327) BP: (158-160)/(68-75) 160/68 (08/10 0500) SpO2:  [97  %-100 %] 97 % (08/10 0500)   Laboratory:  Recent Labs Lab 06/24/16 1549 06/25/16 0606 06/26/16 0540  WBC 12.2* 5.8 6.7  HGB 13.8 12.6* 12.1*  HCT 42.2 38.4* 38.0*  PLT 199 137* 137*    Recent Labs Lab 06/24/16 1549 06/25/16 0606 06/26/16 0540  NA 132* 136 138  K 4.5 4.4 4.3  CL 97* 105 107  CO2 24 24 26   BUN 21* 18 10  CREATININE 1.46* 1.14 1.08  CALCIUM 9.4 8.5* 8.4*  PROT 7.0  --   --   BILITOT 1.0  --   --   ALKPHOS 50  --   --   ALT 23  --   --   AST 22  --   --   GLUCOSE 146* 128* 126*    Imaging/Diagnostic Tests:  No results found.    Lovenia Kim, MD 06/26/2016, 9:12 AM PGY-1, [place] Intern pager: [phone], text pages welcome

## 2016-06-27 ENCOUNTER — Inpatient Hospital Stay (HOSPITAL_COMMUNITY): Payer: Medicare Other

## 2016-06-27 DIAGNOSIS — E118 Type 2 diabetes mellitus with unspecified complications: Secondary | ICD-10-CM

## 2016-06-27 LAB — CBC
HCT: 35.7 % — ABNORMAL LOW (ref 39.0–52.0)
Hemoglobin: 11.4 g/dL — ABNORMAL LOW (ref 13.0–17.0)
MCH: 29.4 pg (ref 26.0–34.0)
MCHC: 31.9 g/dL (ref 30.0–36.0)
MCV: 92 fL (ref 78.0–100.0)
PLATELETS: 133 10*3/uL — AB (ref 150–400)
RBC: 3.88 MIL/uL — AB (ref 4.22–5.81)
RDW: 14.8 % (ref 11.5–15.5)
WBC: 9.4 10*3/uL (ref 4.0–10.5)

## 2016-06-27 LAB — BASIC METABOLIC PANEL
Anion gap: 7 (ref 5–15)
BUN: 7 mg/dL (ref 6–20)
CALCIUM: 8.2 mg/dL — AB (ref 8.9–10.3)
CHLORIDE: 105 mmol/L (ref 101–111)
CO2: 24 mmol/L (ref 22–32)
CREATININE: 0.99 mg/dL (ref 0.61–1.24)
Glucose, Bld: 136 mg/dL — ABNORMAL HIGH (ref 65–99)
Potassium: 4.1 mmol/L (ref 3.5–5.1)
SODIUM: 136 mmol/L (ref 135–145)

## 2016-06-27 LAB — APTT: APTT: 144 s — AB (ref 24–36)

## 2016-06-27 LAB — HEPARIN LEVEL (UNFRACTIONATED): HEPARIN UNFRACTIONATED: 0.57 [IU]/mL (ref 0.30–0.70)

## 2016-06-27 LAB — HEMOGLOBIN A1C
Hgb A1c MFr Bld: 6.4 % — ABNORMAL HIGH (ref 4.8–5.6)
MEAN PLASMA GLUCOSE: 137 mg/dL

## 2016-06-27 MED ORDER — IOPAMIDOL (ISOVUE-300) INJECTION 61%
INTRAVENOUS | Status: AC
  Administered 2016-06-27: 100 mL
  Filled 2016-06-27: qty 100

## 2016-06-27 MED ORDER — DRONEDARONE HCL 400 MG PO TABS
400.0000 mg | ORAL_TABLET | Freq: Two times a day (BID) | ORAL | Status: DC
  Filled 2016-06-27: qty 1

## 2016-06-27 MED ORDER — HEPARIN BOLUS VIA INFUSION
2000.0000 [IU] | Freq: Once | INTRAVENOUS | Status: AC
  Administered 2016-06-27: 2000 [IU] via INTRAVENOUS
  Filled 2016-06-27: qty 2000

## 2016-06-27 MED ORDER — RAMIPRIL 2.5 MG PO CAPS
2.5000 mg | ORAL_CAPSULE | Freq: Every morning | ORAL | Status: DC
  Filled 2016-06-27: qty 1

## 2016-06-27 MED ORDER — FUROSEMIDE 10 MG/ML IJ SOLN
40.0000 mg | Freq: Once | INTRAMUSCULAR | Status: AC
  Administered 2016-06-27: 40 mg via INTRAVENOUS
  Filled 2016-06-27: qty 4

## 2016-06-27 MED ORDER — METOPROLOL TARTRATE 5 MG/5ML IV SOLN: 5.0000 mg | Freq: Two times a day (BID) | INTRAVENOUS | Status: DC

## 2016-06-27 MED ORDER — HEPARIN (PORCINE) IN NACL 100-0.45 UNIT/ML-% IJ SOLN
1000.0000 [IU]/h | INTRAMUSCULAR | Status: DC
  Administered 2016-06-27: 1000 [IU]/h via INTRAVENOUS
  Filled 2016-06-27 (×3): qty 250

## 2016-06-27 MED ORDER — SODIUM CHLORIDE 0.9 % IV SOLN
INTRAVENOUS | Status: DC
  Administered 2016-06-27: 09:00:00 via INTRAVENOUS

## 2016-06-27 MED ORDER — METOPROLOL TARTRATE 5 MG/5ML IV SOLN
2.5000 mg | Freq: Four times a day (QID) | INTRAVENOUS | Status: DC
  Administered 2016-06-27 – 2016-06-30 (×13): 2.5 mg via INTRAVENOUS
  Filled 2016-06-27 (×13): qty 5

## 2016-06-27 MED ORDER — DIATRIZOATE MEGLUMINE & SODIUM 66-10 % PO SOLN
ORAL | Status: AC
  Administered 2016-06-27: 18:00:00
  Filled 2016-06-27: qty 30

## 2016-06-27 MED ORDER — RIVAROXABAN 15 MG PO TABS: 15.0000 mg | ORAL_TABLET | Freq: Every day | ORAL | Status: DC

## 2016-06-27 MED ORDER — TAMSULOSIN HCL 0.4 MG PO CAPS
0.4000 mg | ORAL_CAPSULE | Freq: Every day | ORAL | Status: DC
  Filled 2016-06-27: qty 1

## 2016-06-27 MED ORDER — NEBIVOLOL HCL 10 MG PO TABS
10.0000 mg | ORAL_TABLET | Freq: Every day | ORAL | Status: DC
  Filled 2016-06-27: qty 1

## 2016-06-27 NOTE — NC FL2 (Signed)
[place] LEVEL OF CARE SCREENING TOOL     IDENTIFICATION  Patient Name: [name] Birthdate: [birth year] Sex: male Admission Date (Current Location): 06/24/2016  [facility] and [place] Number:  Herbalist and Address:  [facility] [place], [place] [address] [place], [place] [address]      Provider Number: [id number]  Attending Physician Name and Address:  Lupita Dawn, MD  Relative Name and Phone Number:       Current Level of Care: Hospital Recommended Level of Care: [place] Prior Approval Number:    Date Approved/Denied:   PASRR Number: [id number]  Discharge Plan: SNF    Current Diagnoses: Patient Active Problem List   Diagnosis Date Noted  . Type 2 diabetes mellitus with complication ([place])   . Atrial fibrillation ([place])   . AKI (acute kidney injury) ([place])   . Absence of bladder continence   . Acquired deformity of nail 12/13/2015  . Diabetes mellitus due to underlying condition with stage 3 chronic kidney disease ([place]) 12/12/2015  . High risk social situation 10/04/2015  . Essential hypertension   . Pressure ulcer, stage 2   . SBO (small bowel obstruction) ([place])   . Small bowel obstruction ([place])   . Gynecomastia, male 05/16/2015  . Shortness of breath on exertion 04/24/2015  . Second degree AV block 11/29/2014  . Dysphagia, pharyngoesophageal phase 09/07/2014  . Abnormal esophagram 09/07/2014  . Dysphagia 08/23/2014  . Symptomatic sinus bradycardia 03/14/2014  . Pacemaker syndrome 03/14/2014  . Volume overload 03/07/2014  . Hyperkalemia 03/03/2014  . Hematuria, gross 12/29/2013  . Acute abdominal pain in right lower quadrant 08/31/2013  . Pacemaker - Medtronic Adapta 2006, gen change 2015 07/23/2013  . Chronic right hip pain 09/22/2012  . Testicular swelling, right 09/22/2012  . Presbycusis 05/14/2012  . Hepatomegaly 01/30/2012  . Anticoagulant long-term use 01/30/2012  . Paroxysmal atrial  fibrillation ([place]) 01/30/2012  . Hx SBO 01/30/2012  . S/P appendectomy 01/30/2012  . CHRONIC KIDNEY DISEASE STAGE III (MODERATE) 12/03/2010  . ALLERGIC RHINITIS 09/05/2009  . DYSPHAGIA UNSPECIFIED 05/29/2009  . HOARSENESS 05/15/2009  . HIP PAIN, LEFT 04/11/2009  . HIATAL HERNIA 12/14/2008  . Diverticulosis of large intestine 12/14/2008  . COLONIC POLYPS, HYPERPLASTIC, HX OF 12/14/2008  . ESOPHAGEAL STRICTURE 10/04/2008  . Other vitamin B12 deficiency anemia 01/26/2008  . GLAUCOMA 01/14/2007  . CATARACT 01/14/2007  . SICK SINUS SYNDROME 01/14/2007  . GASTROESOPHAGEAL REFLUX, NO ESOPHAGITIS 01/14/2007  . BPH (benign prostatic hyperplasia) 01/14/2007    Orientation RESPIRATION BLADDER Height & Weight     Self, Time, Situation, Place  Normal Continent Weight: 197 lb 4.8 oz (89.5 kg) Height:  5\' 6"  (167.6 cm)  BEHAVIORAL SYMPTOMS/MOOD NEUROLOGICAL BOWEL NUTRITION STATUS      Continent Diet (Please see discharge summary.)  AMBULATORY STATUS COMMUNICATION OF NEEDS Skin   Limited Assist Verbally Normal                       Personal Care Assistance Level of Assistance  Bathing, Feeding, Dressing Bathing Assistance: Limited assistance Feeding assistance: Limited assistance Dressing Assistance: Limited assistance     Functional Limitations Info             SPECIAL CARE FACTORS FREQUENCY  PT (By licensed PT), OT (By licensed OT)     PT Frequency: 5 OT Frequency: 5            Contractures  Additional Factors Info  Code Status, Allergies Code Status Info: DNR Allergies Info: Codeine           Current Medications (06/27/2016):  This is the current hospital active medication list Current Facility-Administered Medications  Medication Dose Route Frequency Provider Last Rate Last Dose  . dextrose 5 %-0.9 % sodium chloride infusion   Intravenous Continuous Everrett Coombe, MD 50 mL/hr at 06/27/16 1204    . famotidine (PEPCID) IVPB 20 mg premix  20 mg Intravenous  Q12H Nicolette Bang, DO   20 mg at 06/27/16 0929  . heparin ADULT infusion 100 units/mL (25000 units/210mL sodium chloride 0.45%)  1,000 Units/hr Intravenous Continuous Tyrone Apple, RPH 10 mL/hr at 06/27/16 1108 1,000 Units/hr at 06/27/16 1108  . metoprolol (LOPRESSOR) injection 2.5 mg  2.5 mg Intravenous Q6H Alyssa A Haney, MD   2.5 mg at 06/27/16 1302  . morphine 2 MG/ML injection 2 mg  2 mg Intravenous Q4H PRN Nicolette Bang, DO      . ondansetron [facility]) injection 4 mg  4 mg Intravenous Q6H PRN Nicolette Bang, DO      . traMADol Veatrice Bourbon) tablet 100 mg  100 mg Oral Q12H PRN Dickie La, MD   100 mg at 06/26/16 1447     Discharge Medications: Please see discharge summary for a list of discharge medications.  Relevant Imaging Results:  Relevant Lab Results:   Additional Information SSN: [id number]  Caroline Sauger, LCSW

## 2016-06-27 NOTE — Care Management Important Message (Signed)
Important Message  Patient Details  Name: [name] MRN: [id number] Date of Birth: Jan 15, 1920   Medicare Important Message Given:  Yes    Merdith Boyd 06/27/2016, 10:10 AM

## 2016-06-27 NOTE — Clinical Social Work Note (Signed)
Clinical Social Work Assessment  Patient Details  Name: [name] MRN: [id number] Date of Birth: [birth year] Permission sought to share information with:  Facility Sport and exercise psychologist, Family Supports Permission granted to share information::     Name::     Dawayne Cirri  Agency::  [facility]  Relationship::  Daughter-in-law  Contact Information:  [phone]  Housing/Transportation Living arrangements for the past 2 months:  Single Family Home Source of Information:  Patient Patient Interpreter Needed:  None Criminal Activity/Legal Involvement Pertinent to Current Situation/Hospitalization:  No - Comment as needed Significant Relationships:  Adult Children Lives with:  Self Do you feel safe going back to the place where you live?  Yes Need for family participation in patient care:  Yes (Comment) (Requesting DIL be in contact.)  Care giving concerns:  Patient expressed no concerns at this time.   Social Worker assessment / plan:  LCSW received referral regarding SNF placement at time of discharge. LCSW met with patient who stated he is agreeable to SNF placement, but would prefer for his DIL to be "in charge" of decision making. LCSW to continue to follow and assist with discharge planning needs.  Employment status:  Retired Forensic scientist:  Medicare PT Recommendations:  [place] / Referral to community resources:  [place]  Patient/Family's Response to care:  Patient understanding and agreeable to [employer] of care.  Patient/Family's Understanding of and Emotional Response to Diagnosis, Current Treatment, and Prognosis:  Patient understanding and agreeable to LCSW plan of care.  Emotional Assessment Appearance:  Appears stated age Attitude/Demeanor/Rapport:  Other (Pleasant) Affect (typically observed):  Accepting, Appropriate, Pleasant Orientation:  Oriented to Self, Oriented to Place, Oriented to  Time, Oriented to  Situation Alcohol / Substance use:  Not Applicable Psych involvement (Current and /or in the community):  No (Comment)  Discharge Needs  Concerns to be addressed:  No discharge needs identified Readmission within the last 30 days:  No Current discharge risk:  None Barriers to Discharge:  No Barriers Identified   Caroline Sauger, LCSW 06/27/2016, 4:14 PM [phone]

## 2016-06-27 NOTE — Clinical Social Work Placement (Signed)
   CLINICAL SOCIAL WORK PLACEMENT  NOTE  Date:  06/27/2016  Patient Details  Name: [name] MRN: [id number] Date of Birth: July 21, 1920  Clinical Social Work is seeking post-discharge placement for this patient at the [place] level of care (*CSW will initial, date and re-position this form in  chart as items are completed):  Yes   Patient/family provided with [place] Work Department's list of facilities offering this level of care within the geographic area requested by the patient (or if unable, by the patient's family).  Yes   Patient/family informed of their freedom to choose among providers that offer the needed level of care, that participate in Medicare, Medicaid or managed care program needed by the patient, have an available bed and are willing to accept the patient.  Yes   Patient/family informed of [facility]'s ownership interest in [facility] and [facility], as well as of the fact that they are under no obligation to receive care at these facilities.  PASRR submitted to EDS on       PASRR number received on       Existing PASRR number confirmed on 06/27/16     FL2 transmitted to all facilities in geographic area requested by pt/family on 06/27/16     FL2 transmitted to all facilities within larger geographic area on       Patient informed that his/her managed care company has contracts with or will negotiate with certain facilities, including the following:            Patient/family informed of bed offers received.  Patient chooses bed at       Physician recommends and patient chooses bed at      Patient to be transferred to   on  .  Patient to be transferred to facility by       Patient family notified on   of transfer.  Name of family member notified:        PHYSICIAN Please sign FL2     Additional Comment:    _______________________________________________ Caroline Sauger, LCSW 06/27/2016, 4:16 PM

## 2016-06-27 NOTE — Progress Notes (Signed)
Family Medicine Teaching Service Daily Progress Note Intern Pager: [phone]  Patient name: [name] Medical record number: [id number] Date of birth: August 12, 1920 Age: 82 y.o. Gender: male  Primary Care Provider: [name], MD Consultants: none Code Status: DNR  Pt Overview and Major Events to Date:  Admit 8/8  Assessment and Plan: [name] is a 82 y.o. male presenting with nausea and abd distention. PMH is significant for previous SBO, hx of esophageal strictures, HTN, T2DM, GERD, gout, h/o diverticulosis, CKD Stage III, Afib on xarelto, BPH.  Partial small bowel obstruction. - worsening abd distension today with SOB which he attributes to increased abd distension - continue to hold home meds - gentle fluid hydration - consider NG tube placement for gastric decompression if worsens and begins to have significant emesis  - Zofran 4mg  IV q6h prn nausea - pain control morphine 2mg  q4prn - IV Pepcid in place of home PPI  -Close follow up with GI - Dr. Hilarie Fredrickson (seen in Feb/2017). Called office for outpatient follow-up care, appointment for 8/18 1.30pm.  BPH: - hold home rapaflow until tolerating better PO -bladder scan shows no urine in bladder. No cath needed for now.  -watch closely for urinary retention given significant BPH  CKD:  Cr on admission 1.46. Improved to 0.99.   -follow creatine  Hyponatremia: Resolved. Am Na 136. <-- 132 at admission. Likely related to hypovolemia.  -corrected with NS   Afib - rate controled - holding home multaq, nebovolol, xarelto - continue lopressor 2.5 q6 - start heparin treatment as unclear when will tolerate PO  HTN BPs elevated overnight and improved with stopping IVF - restart home antihypertensives as tolerating PO  T2DM last a1c 12/06/14 was 6.0  H/o HFpEF Last echo was 2006 with preserved EF and currently euvolemic -At home on torsemide mondays and fridays.this has been held. Today weight 197 lb from 189lb on  admission concerning for fluid overload. No evidence of gross fluid overload on exam.  - Lasix 40 mg IV x1 then assess diuresis - Given he is NPO and it is unclear when he will tolerate PO, will start gentle fluid D5NS at 50 cc/hr - daily weights - strict I/O  FEN/GI: NPO, IVF 50 Prophylaxis: heparin  Disposition: admitted to [facility] teaching service   Subjective: Last BM yesterday, had not passed gas today. Feels abd becoming more distended with PO intake. Denies nausea/emesis. Feels abd distension makes it difficult to breath so was started pm O2 overnight. Breathing improved this AM. Denies chest pain  Objective: Temp:  [97.5 F (36.4 C)-98.4 F (36.9 C)] 97.5 F (36.4 C) (08/11 0437) Pulse Rate:  [71-94] 72 (08/11 0437) Resp:  [19-20] 20 (08/11 0437) BP: (146-160)/(62-75) 158/62 (08/11 0437) SpO2:  [95 %-100 %] 98 % (08/11 0437)  General: sitting in chair, pleasant, in NAD Eyes: EOMI ENTM: dry MM, hard of hearing Cardiovascular: RRR, no murmurs noted, trace LE edema to ankles bilaterally Respiratory: CTAB, normal effort, no wheezes or crackles  Abdomen: more tense and distended than yesterday, nontender, +BS concern for some element of high-pitched quality  Skin: no rashes noted Extremities: no LE edema Neuro: AOx3 Psych: appropriate mood and affect  Laboratory:  Recent Labs Lab 06/25/16 0606 06/26/16 0540 06/27/16 0242  WBC 5.8 6.7 9.4  HGB 12.6* 12.1* 11.4*  HCT 38.4* 38.0* 35.7*  PLT 137* 137* 133*    Recent Labs Lab 06/24/16 1549 06/25/16 0606 06/26/16 0540 06/27/16 0242  NA 132* 136 138 136  K  4.5 4.4 4.3 4.1  CL 97* 105 107 105  CO2 24 24 26 24   BUN 21* 18 10 7   CREATININE 1.46* 1.14 1.08 0.99  CALCIUM 9.4 8.5* 8.4* 8.2*  PROT 7.0  --   --   --   BILITOT 1.0  --   --   --   ALKPHOS 50  --   --   --   ALT 23  --   --   --   AST 22  --   --   --   GLUCOSE 146* 128* 126* 136*    Imaging/Diagnostic Tests:  No results found.    Veatrice Bourbon, MD 06/27/2016, 8:23 AM PGY-3, [place] Intern pager: [phone], text pages welcome

## 2016-06-27 NOTE — Progress Notes (Signed)
ANTICOAGULATION CONSULT NOTE   Pharmacy Consult:  Heparin Indication: atrial fibrillation  Allergies  Allergen Reactions  . Codeine     REACTION: nausea    Patient Measurements: Height: 5\' 6"  (167.6 cm) Weight: 197 lb 4.8 oz (89.5 kg) IBW/kg (Calculated) : 63.8 Heparin Dosing Weight: 83 kg  Vital Signs: Temp: 97.8 F (36.6 C) (08/11 1311) Temp Source: Oral (08/11 1311) BP: 180/76 (08/11 1311) Pulse Rate: 70 (08/11 1311)  Labs:  Recent Labs  06/25/16 0606 06/26/16 0540 06/27/16 0242 06/27/16 1839 06/27/16 1840  HGB 12.6* 12.1* 11.4*  --   --   HCT 38.4* 38.0* 35.7*  --   --   PLT 137* 137* 133*  --   --   APTT  --   --   --   --  144*  HEPARINUNFRC  --   --   --  0.57  --   CREATININE 1.14 1.08 0.99  --   --     Estimated Creatinine Clearance: 48.9 mL/min (by C-G formula based on SCr of 0.99 mg/dL).   Medical History: Past Medical History:  Diagnosis Date  . Acute gastritis without mention of hemorrhage   . Arthritis   . Atrial fibrillation ([place])   . Chronic kidney disease, stage III (moderate)   . Diverticulosis   . Diverticulosis of colon (without mention of hemorrhage)   . Environmental allergies    HISTORY OF  . Esophageal dysmotility   . Family history of malignant neoplasm of gastrointestinal tract   . GERD (gastroesophageal reflux disease)   . Gout   . Hiatal hernia   . History of skin cancer   . Hypertension   . Hypertrophy of prostate with urinary obstruction and other lower urinary tract symptoms (LUTS)   . Overactive bladder   . Personal history of colonic polyps 10/21/2006   hyperplastic   . Presence of permanent cardiac pacemaker 10/14/05   medtronic Adapta  . Second degree AV block 11/29/2014  . Sinus node dysfunction ([facility])   . Small bowel obstruction ([place])   . Stricture and stenosis of esophagus   . Type II or unspecified type diabetes mellitus without mention of complication, not stated as uncontrolled    pt states that he checks  his blood sugar once weekly, is not on oral meds or insulin  . Unspecified cataract   . Vitamin B12 deficiency    Assessment: 40 YOM with history of Afib on Xarelto PTA. Xarelto on hold due to concern for SBO. Heparin gtt in the interim.   Initial heparin level was 0.57 on 1000 units/hr.   Goal of Therapy:  Heparin level 0.3-0.7 units/ml  Monitor platelets by anticoagulation protocol: Yes    Plan:  - Continue Heparin gtt at 1000 units/hr - HL in am as confirmatory  - Daily HL / CBC   Vincenza Hews, PharmD, BCPS 06/27/2016, 7:43 PM Pager: [phone]

## 2016-06-27 NOTE — Progress Notes (Signed)
ANTICOAGULATION CONSULT NOTE - Initial Consult  Pharmacy Consult:  Heparin Indication: atrial fibrillation  Allergies  Allergen Reactions  . Codeine     REACTION: nausea    Patient Measurements: Height: 5\' 6"  (167.6 cm) Weight: 197 lb 4.8 oz (89.5 kg) IBW/kg (Calculated) : 63.8 Heparin Dosing Weight: 83 kg  Vital Signs: Temp: 97.5 F (36.4 C) (08/11 0437) Temp Source: Oral (08/11 0437) BP: 158/62 (08/11 0437) Pulse Rate: 72 (08/11 0437)  Labs:  Recent Labs  06/25/16 0606 06/26/16 0540 06/27/16 0242  HGB 12.6* 12.1* 11.4*  HCT 38.4* 38.0* 35.7*  PLT 137* 137* 133*  CREATININE 1.14 1.08 0.99    Estimated Creatinine Clearance: 48.9 mL/min (by C-G formula based on SCr of 0.99 mg/dL).   Medical History: Past Medical History:  Diagnosis Date  . Acute gastritis without mention of hemorrhage   . Arthritis   . Atrial fibrillation ([place])   . Chronic kidney disease, stage III (moderate)   . Diverticulosis   . Diverticulosis of colon (without mention of hemorrhage)   . Environmental allergies    HISTORY OF  . Esophageal dysmotility   . Family history of malignant neoplasm of gastrointestinal tract   . GERD (gastroesophageal reflux disease)   . Gout   . Hiatal hernia   . History of skin cancer   . Hypertension   . Hypertrophy of prostate with urinary obstruction and other lower urinary tract symptoms (LUTS)   . Overactive bladder   . Personal history of colonic polyps 10/21/2006   hyperplastic   . Presence of permanent cardiac pacemaker 10/14/05   medtronic Adapta  . Second degree AV block 11/29/2014  . Sinus node dysfunction (H[facility])   . Small bowel obstruction ([place])   . Stricture and stenosis of esophagus   . Type II or unspecified type diabetes mellitus without mention of complication, not stated as uncontrolled    pt states that he checks his blood sugar once weekly, is not on oral meds or insulin  . Unspecified cataract   . Vitamin B12 deficiency        Assessment: 89 YOM with history of Afib on Xarelto PTA.  Patient was transitioned to heparin SQ on admission due to SBO/NPO status.  Now to increase to full heparin.  FOB negative and baseline labs reviewed.  Noted patient last received heparin SQ around 0530 today and her last Xarelto dose was on 06/23/16.   Goal of Therapy:  Heparin level 0.3-0.7 units/ml  APTT 66-102 s Monitor platelets by anticoagulation protocol: Yes    Plan:  - Heparin 2000 units IV bolus (reduced d/t recent heparin SQ administration) - Heparin gtt at 1000 units/hr - Check 8 hr HL and aPTT - Daily HL / CBC   Osmani Kersten D. Mina Marble, PharmD, BCPS Pager:  [phone] 06/27/2016, 9:14 AM

## 2016-06-27 NOTE — Evaluation (Signed)
Physical Therapy Evaluation Patient Details Name: [name] MRN: [id number] DOB: [birth year] Today's Date: 06/27/2016   History of Present Illness  82 yo admitted for SBO. PMHx: appendectomy, Afib, pacemaker, CKD, BPH, gout, HTN, DM, GERD  Clinical Impression  Pt very pleasant and HOH who was willing and able to ambulate short distance with Rw. Pt with 3-4/5 bil LE strength but fatigues with less than home distance. Pt has some assist at home but no one available throughout the day and pt confirms he is too weak and debilitated to function independently at this time. Pt with decreased activity tolerance, function, balance and knowledge of DME use who will benefit from acute therapy to address all above to decrease caregiver burden and decrease fall risk. Pt states he has not had a fall in the last year. Pt agreeable to ST-SNF prior to return home and was previously at [facility]. Will follow acutely. Pt with sats 98% throughout on RA with RN aware.     Follow Up Recommendations SNF;Supervision for mobility/OOB    Equipment Recommendations  None recommended by PT    Recommendations for Other Services OT consult     Precautions / Restrictions Precautions Precautions: Fall      Mobility  Bed Mobility               General bed mobility comments: in chair on arrival  Transfers Overall transfer level: Needs assistance   Transfers: Sit to/from Stand Sit to Stand: Min guard         General transfer comment: increased time and repetitions with momentum and cues for sequence before able to achieve standing  Ambulation/Gait Ambulation/Gait assistance: Min guard Ambulation Distance (Feet): 120 Feet Assistive device: Rolling walker (2 wheeled) Gait Pattern/deviations: Step-through pattern;Decreased stride length;Shuffle;Trunk flexed   Gait velocity interpretation: Below normal speed for age/gender General Gait Details: cues for posture and position in RW, pt limited by  fatigue  Stairs            Wheelchair Mobility    Modified Rankin (Stroke Patients Only)       Balance Overall balance assessment: Needs assistance   Sitting balance-Leahy Scale: Good       Standing balance-Leahy Scale: Fair                               Pertinent Vitals/Pain Pain Assessment: No/denies pain    Home Living Family/patient expects to be discharged to:: Private residence Living Arrangements: Alone Available Help at Discharge: Friend(s);Family;Available PRN/intermittently Type of Home: House Home Access: Stairs to enter Entrance Stairs-Rails: Right Entrance Stairs-Number of Steps: 2 Home Layout: One level Home Equipment: Walker - 2 wheels;Shower seat - built in;Toilet riser;Bedside commode;Walker - 4 wheels      Prior Function Level of Independence: Independent with assistive device(s)         Comments: Amb with RW due to R hip weakness, stands in shower despite seats and sits for dressing. Neighbor assists with trash can, family does the laundry. Pt states he drives short distances and does his own grocery shopping     Hand Dominance        Extremity/Trunk Assessment   Upper Extremity Assessment: Generalized weakness           Lower Extremity Assessment: Generalized weakness      Cervical / Trunk Assessment: Kyphotic  Communication   Communication: HOH  Cognition Arousal/Alertness: Awake/alert Behavior During Therapy: Flat affect Overall Cognitive  Status: Within Functional Limits for tasks assessed                      General Comments      Exercises        Assessment/Plan    PT Assessment Patient needs continued PT services  PT Diagnosis Difficulty walking;Generalized weakness   PT Problem List Decreased strength;Decreased mobility;Decreased activity tolerance;Decreased balance;Decreased knowledge of use of DME  PT Treatment Interventions Gait training;Stair training;Functional mobility  training;Therapeutic exercise;Therapeutic activities;Patient/family education;DME instruction   PT Goals (Current goals can be found in the Care Plan section) Acute Rehab PT Goals Patient Stated Goal: return home after I get stronger PT Goal Formulation: With patient Time For Goal Achievement: 07/11/16 Potential to Achieve Goals: Fair    Frequency Min 3X/week   Barriers to discharge Decreased caregiver support      Co-evaluation               End of Session   Activity Tolerance: Patient tolerated treatment well Patient left: in chair;with call bell/phone within reach;with chair alarm set;with nursing/sitter in room Nurse Communication: Mobility status;Precautions         Time: [id number] PT Time Calculation (min) (ACUTE ONLY): 16 min   Charges:   PT Evaluation $PT Eval Moderate Complexity: 1 Procedure     PT G CodesMelford Aase 06/27/2016, 11:14 AM Elwyn Reach, [place]

## 2016-06-27 NOTE — Progress Notes (Signed)
FPTS Interim Progress Note  S: Went to evaluate patient in the PM  X-ray abdomen 1 view showed "Gaseous distension of small bowel loops mid abdomen suspicious for significant ileus or bowel obstruction. Moderate gaseous distension of proximal colon". Discussed with Dr. Eustace Quail who reports that we cannot tell if SBO is partial or complete but distended bowel loops does seem to have worsened. Recommended repeating CT abdomen with contrast to further evaluate.   Patient denies any nausea or pain but does endorse worsening bloating. Was able to pass gas and per chart review, had two stools today.    O: BP (!) 180/76 (BP Location: Right Arm)   Pulse 70   Temp 97.8 F (36.6 C) (Oral)   Resp 20   Ht 5\' 6"  (1.676 m)   Wt 89.5 kg (197 lb 4.8 oz)   SpO2 99%   BMI 31.85 kg/m     A/P: Will obtain CT abdomen with contrast to further evaluate.   Smiley Houseman, MD 06/27/2016, 4:15 PM PGY-2, [facility] Medicine Service pager [phone]

## 2016-06-28 ENCOUNTER — Inpatient Hospital Stay (HOSPITAL_COMMUNITY): Payer: Medicare Other

## 2016-06-28 LAB — CBC
HEMATOCRIT: 34.3 % — AB (ref 39.0–52.0)
HEMOGLOBIN: 11.2 g/dL — AB (ref 13.0–17.0)
MCH: 29.3 pg (ref 26.0–34.0)
MCHC: 32.7 g/dL (ref 30.0–36.0)
MCV: 89.8 fL (ref 78.0–100.0)
Platelets: 124 10*3/uL — ABNORMAL LOW (ref 150–400)
RBC: 3.82 MIL/uL — AB (ref 4.22–5.81)
RDW: 14.5 % (ref 11.5–15.5)
WBC: 7.2 10*3/uL (ref 4.0–10.5)

## 2016-06-28 LAB — BASIC METABOLIC PANEL
ANION GAP: 11 (ref 5–15)
BUN: 9 mg/dL (ref 6–20)
CHLORIDE: 100 mmol/L — AB (ref 101–111)
CO2: 23 mmol/L (ref 22–32)
CREATININE: 1.06 mg/dL (ref 0.61–1.24)
Calcium: 8.5 mg/dL — ABNORMAL LOW (ref 8.9–10.3)
GFR calc non Af Amer: 58 mL/min — ABNORMAL LOW (ref >60)
Glucose, Bld: 110 mg/dL — ABNORMAL HIGH (ref 65–99)
POTASSIUM: 3.4 mmol/L — AB (ref 3.5–5.1)
SODIUM: 134 mmol/L — AB (ref 135–145)

## 2016-06-28 LAB — APTT
APTT: 143 s — AB (ref 24–36)
aPTT: 77 seconds — ABNORMAL HIGH (ref 24–36)

## 2016-06-28 LAB — HEPARIN LEVEL (UNFRACTIONATED)
Heparin Unfractionated: 0.39 IU/mL (ref 0.30–0.70)
Heparin Unfractionated: 0.14 IU/mL — ABNORMAL LOW (ref 0.30–0.70)

## 2016-06-28 MED ORDER — BLISTEX MEDICATED EX OINT
TOPICAL_OINTMENT | CUTANEOUS | Status: DC | PRN
  Administered 2016-06-28: 23:00:00 via TOPICAL
  Filled 2016-06-28: qty 6.3

## 2016-06-28 MED ORDER — LIP MEDEX EX OINT
1.0000 "application " | TOPICAL_OINTMENT | CUTANEOUS | Status: DC | PRN
  Filled 2016-06-28: qty 7

## 2016-06-28 MED ORDER — HEPARIN (PORCINE) IN NACL 100-0.45 UNIT/ML-% IJ SOLN
1500.0000 [IU]/h | INTRAMUSCULAR | Status: DC
  Administered 2016-06-28: 800 [IU]/h via INTRAVENOUS
  Administered 2016-06-29: 1200 [IU]/h via INTRAVENOUS
  Filled 2016-06-28 (×5): qty 250

## 2016-06-28 MED ORDER — HYDRALAZINE HCL 20 MG/ML IJ SOLN
5.0000 mg | INTRAMUSCULAR | Status: DC | PRN
  Filled 2016-06-28: qty 1

## 2016-06-28 MED ORDER — CHLORHEXIDINE GLUCONATE 0.12 % MT SOLN
15.0000 mL | Freq: Two times a day (BID) | OROMUCOSAL | Status: DC
  Administered 2016-06-28 – 2016-06-29 (×2): 15 mL via OROMUCOSAL
  Filled 2016-06-28 (×2): qty 15

## 2016-06-28 MED ORDER — CETYLPYRIDINIUM CHLORIDE 0.05 % MT LIQD: 7.0000 mL | Freq: Two times a day (BID) | OROMUCOSAL | Status: DC

## 2016-06-28 NOTE — Progress Notes (Signed)
ANTICOAGULATION CONSULT NOTE - FOLLOW UP  Pharmacy Consult:  Heparin Indication: atrial fibrillation  Allergies  Allergen Reactions  . Codeine     REACTION: nausea    Patient Measurements: Height: 5\' 6"  (167.6 cm) Weight: 195 lb (88.5 kg) IBW/kg (Calculated) : 63.8 Heparin Dosing Weight: 83 kg  Vital Signs: Temp: 97.5 F (36.4 C) (08/12 0450) Temp Source: Oral (08/12 0450) BP: 152/65 (08/12 0450) Pulse Rate: 72 (08/12 0450)  Labs:  Recent Labs  06/26/16 0540 06/27/16 0242 06/27/16 1839 06/27/16 1840 06/28/16 0437  HGB 12.1* 11.4*  --   --  11.2*  HCT 38.0* 35.7*  --   --  34.3*  PLT 137* 133*  --   --  124*  APTT  --   --   --  144* 143*  HEPARINUNFRC  --   --  0.57  --  0.39  CREATININE 1.08 0.99  --   --   --     Estimated Creatinine Clearance: 48.6 mL/min (by C-G formula based on SCr of 0.99 mg/dL).    Assessment: [name] with history of Afib on Xarelto PTA.  Patient was transitioned to heparin SQ on admission due to SBO/NPO status, and then changed to full heparin bridge on 06/27/16.  Patient last took Xarelto on 06/23/16 and heparin level is therapeutic; however, aPTT is elevated.  Platelet count is trending down but he is FOB negative on 06/27/16.   Goal of Therapy:  Heparin level 0.3-0.7 units/ml  APTT 66-102 sec Monitor platelets by anticoagulation protocol: Yes    Plan:  - Reduce heparin gtt to 800 units/hr - Check 8 hr aPTT and HL - Daily HL / CBC / aPTT   Triana Coover D. Mina Marble, PharmD, BCPS Pager:  [phone] 06/28/2016, 9:43 AM

## 2016-06-28 NOTE — Progress Notes (Signed)
Family Medicine Teaching Service Daily Progress Note Intern Pager: [phone]  Patient name: [name] Medical record number: [id number] Date of birth: 09/10/1920 Age: 82 y.o. Gender: male  Primary Care Provider: [name], MD Consultants: none Code Status: DNR  Pt Overview and Major Events to Date:  Admit 8/8  Assessment and Plan: [name] is a 82 y.o. male presenting with nausea and abd distention. PMH is significant for previous SBO, hx of esophageal strictures, HTN, T2DM, GERD, gout, h/o diverticulosis, CKD Stage III, Afib on xarelto, BPH.  Partial small bowel obstruction. - Still reports some abd pain butimproving abd distension and SOB after being made NPO yesterday and s/p IV lasix 40 x1. Passing flatus and had two BMs in the last 24 hours. Desires to remain NPO today to continue to rest bowel. KUB on 8/11 concerning for worsening, CT abd showed resolved SBO - continue NPO today per patient request - continue to hold home meds - gentle fluid hydration - consider NG tube placement for gastric decompression if worsens and begins to have significant emesis  - Zofran 4mg  IV q6h prn nausea - pain control morphine 2mg  q4prn - IV Pepcid in place of home PPI  -Close follow up with GI - Dr. Hilarie Fredrickson (seen in Feb/2017). Called office for outpatient follow-up care, appointment for 8/18 1.30pm.  Resp: Has baseline SOB at night with. Given body habitus there is concern for OSA. He doesnot use CPAP at home - supp O2 as needed - consider work up as an outpatient  BPH: - hold home rapaflow until tolerating better PO -bladder scan shows no urine in bladder. No cath needed for now.  -watch closely for urinary retention given significant BPH  CKD:  Cr on admission 1.46. Improved to 0.99 yesterday - f/u BMP today   Hyponatremia: Resolved. Am Na 136. <-- 132 at admission. Likely related to hypovolemia.  -corrected with NS   Afib - rate controled - holding home multaq,  nebovolol, xarelto - continue lopressor 2.5 q6 - start heparin treatment as unclear when will tolerate PO  HTN BPs elevated overnight and improved with stopping IVF - restart home antihypertensives as tolerating PO  T2DM last a1c 12/06/14 was 6.0  H/o HFpEF Last echo was 2006 with preserved EF and currently euvolemic -At home on torsemide mondays and fridays.this has been held. Weight 195--> 197 as pateitn does not look significanltly fluid up and night time SOB is at baseline there is concern that his first weight 189 was inaccurate.  - s/p Lasix 40 mg IV x1 on Friday 8/11 with good diuresis - will give IV lasix on Monday if still NPO and cannot take home torsemde - Given he is NPO and it is unclear when he will tolerate PO, will start gentle fluid D5NS at 50 cc/hr - daily weights - strict I/O  FEN/GI: NPO, IVF 50 Prophylaxis: heparin  Disposition: admitted to [facility] teaching service   Subjective: Last BM this AM, passinggas. Feels abd less distended still feels " rumbly". Denies nausea/emesis. Breathing improved this AM. Denies chest pain  Objective: Temp:  [97.5 F (36.4 C)-97.8 F (36.6 C)] 97.5 F (36.4 C) (08/12 0450) Pulse Rate:  [70-72] 72 (08/12 0450) Resp:  [18-20] 18 (08/12 0450) BP: (152-180)/(65-76) 152/65 (08/12 0450) SpO2:  [95 %-99 %] 95 % (08/12 0450) Weight:  [195 lb (88.5 kg)] 195 lb (88.5 kg) (08/12 0450)  General: sitting in chair, pleasant, in NAD Eyes: EOMI ENTM: dry MM, hard of hearing Cardiovascular:  RRR, no murmurs noted, trace LE edema to ankles bilaterally Respiratory: CTAB, normal effort, no wheezes or crackles  Abdomen: less tense and distended than yesterday, soft, nontender, +BS concern for some element of high-pitched quality  Skin: no rashes noted Extremities: no LE edema Neuro: AOx3 Psych: appropriate mood and affect  Laboratory:  Recent Labs Lab 06/26/16 0540 06/27/16 0242 06/28/16 0437  WBC 6.7 9.4 7.2  HGB 12.1* 11.4* 11.2*   HCT 38.0* 35.7* 34.3*  PLT 137* 133* 124*    Recent Labs Lab 06/24/16 1549 06/25/16 0606 06/26/16 0540 06/27/16 0242  NA 132* 136 138 136  K 4.5 4.4 4.3 4.1  CL 97* 105 107 105  CO2 24 24 26 24   BUN 21* 18 10 7   CREATININE 1.46* 1.14 1.08 0.99  CALCIUM 9.4 8.5* 8.4* 8.2*  PROT 7.0  --   --   --   BILITOT 1.0  --   --   --   ALKPHOS 50  --   --   --   ALT 23  --   --   --   AST 22  --   --   --   GLUCOSE 146* 128* 126* 136*    Imaging/Diagnostic Tests:  Dg Abd 1 View  Result Date: 06/27/2016 CLINICAL DATA:  Small bowel obstruction EXAM: ABDOMEN - 1 VIEW COMPARISON:  06/24/2016 FINDINGS: There are gaseous distended small bowel loops mid and left abdomen highly suspicious for significant ileus or bowel obstruction. Moderate gaseous distension of the right colon and transverse colon. IMPRESSION: Gaseous distension of small bowel loops mid abdomen suspicious for significant ileus or bowel obstruction. Moderate gaseous distension of proximal colon. Electronically Signed   By: Lahoma Crocker M.D.   On: 06/27/2016 14:39   Ct Abdomen Pelvis W Contrast  Result Date: 06/27/2016 CLINICAL DATA:  82 year old male with recent small bowel obstruction. Initial encounter. EXAM: CT ABDOMEN AND PELVIS WITH CONTRAST TECHNIQUE: Multidetector CT imaging of the abdomen and pelvis was performed using the standard protocol following bolus administration of intravenous contrast. CONTRAST:  100 mL Isovue 370 COMPARISON:  CT Abdomen and Pelvis 06/24/2016 and earlier. FINDINGS: Stable mild cardiomegaly with no pericardial effusion. New layering pleural effusions, small left greater than right. Mild left lower lobe consolidation. Dependent atelectasis on the right. Stable visualized osseous structures. Streak artifact in the pelvis related to the left hip arthroplasty hardware. Trace presacral stranding has developed, trace right side pelvic free fluid (series 21, image 71). Unremarkable urinary bladder. Oral  contrast has reached the rectum. The rectum and sigmoid colon appear negative aside from diverticulosis. Trace free fluid at the left gutter. Redundant sigmoid and left colon with diverticulosis throughout, but no definite active inflammation. Occasional diverticulum stab mild diverticulosis of the transverse colon which is mildly distended with gas and contrast. Mild diverticulosis of the right colon which contains contrast throughout. Negative terminal ileum. Trace free fluid in the lower right gutter. Appendix not identified and could be diminutive or absent. Interval resolved small bowel dilatation. Oral contrast throughout nondilated small bowel loops today. Decompressed stomach and duodenum. No abdominal free air. Stable liver. Gallbladder sludge or layering stones but no pericholecystic inflammation. Stable spleen, pancreas (fatty atrophied), adrenal glands, and kidneys. Portal venous system appears patent. Calcified aortic atherosclerosis. Mild infrarenal abdominal aortic ectasia up to 26 mm. Major arterial structures appear patent. Renal contrast excretion within normal limits. No lymphadenopathy. IMPRESSION: 1. Resolved small bowel obstruction. Trace free fluid in the lower pericolic gutters and pelvis, probably  related to the recent obstruction. No free air. 2. New small pleural effusions, worse on the left where there is associated lower lobe consolidation suspicious for PNEUMONIA. 3. Diverticulosis in much of the colon without active inflammation. 4. Gallbladder layering sludge or stones without CT evidence of acute cholecystitis. 5. Calcified aortic atherosclerosis and ectatic abdominal aorta at risk for aneurysm development. Recommend followup by Ultrasound in 5 years. This recommendation follows ACR consensus guidelines: White Paper of the ACR Incidental Findings Committee II on Vascular Findings. J Am Coll Radiol 2013; 10:789-794. Electronically Signed   By: Genevie Ann M.D.   On: 06/27/2016 21:29       Veatrice Bourbon, MD 06/28/2016, 10:17 AM PGY-3, [place] Intern pager: [phone], text pages welcome

## 2016-06-28 NOTE — Progress Notes (Signed)
ANTICOAGULATION CONSULT NOTE - FOLLOW UP  Pharmacy Consult:  Heparin Indication: atrial fibrillation  Allergies  Allergen Reactions  . Codeine     REACTION: nausea    Patient Measurements: Height: 5\' 6"  (167.6 cm) Weight: 195 lb (88.5 kg) IBW/kg (Calculated) : 63.8 Heparin Dosing Weight: 83 kg  Vital Signs: Temp: 97.6 F (36.4 C) (08/12 1300) Temp Source: Oral (08/12 1300) BP: 176/76 (08/12 1300) Pulse Rate: 70 (08/12 1300)  Labs:  Recent Labs  06/26/16 0540 06/27/16 0242 06/27/16 1839 06/27/16 1840 06/28/16 0437 06/28/16 1033 06/28/16 1955  HGB 12.1* 11.4*  --   --  11.2*  --   --   HCT 38.0* 35.7*  --   --  34.3*  --   --   PLT 137* 133*  --   --  124*  --   --   APTT  --   --   --  144* 143*  --  77*  HEPARINUNFRC  --   --  0.57  --  0.39  --  0.14*  CREATININE 1.08 0.99  --   --   --  1.06  --     Estimated Creatinine Clearance: 45.4 mL/min (by C-G formula based on SCr of 1.06 mg/dL).    Assessment: 25 YOM with history of Afib on Xarelto PTA.  Patient was transitioned to heparin SQ on admission due to SBO/NPO status, and then changed to full heparin bridge on 06/27/16.  Patient last took Xarelto on 06/23/16 and heparin level was therapeutic; however, aPTT was elevated and heparin rate was reduced.   HL now subtherapeutic (0.14) on rate decrease to 800 units/h. APTT decreased from 143>77. Hg low stable, latelet count is trending down but he is FOBT negative on 06/27/16. No bleed or IV line issues per RN.  Goal of Therapy:  Heparin level 0.3-0.7 units/ml  APTT 66-102 sec Monitor platelets by anticoagulation protocol: Yes   Plan:  - Increase heparin gtt to 950 units/hr - Check 8 hr HL - Daily HL / CBC / aPTT   Elicia Lamp, PharmD, [facility] Clinical Pharmacist Pager [phone] 06/28/2016 8:38 PM

## 2016-06-29 ENCOUNTER — Encounter (HOSPITAL_COMMUNITY): Payer: Self-pay

## 2016-06-29 LAB — CBC
HEMATOCRIT: 33.8 % — AB (ref 39.0–52.0)
HEMOGLOBIN: 11 g/dL — AB (ref 13.0–17.0)
MCH: 29.7 pg (ref 26.0–34.0)
MCHC: 32.5 g/dL (ref 30.0–36.0)
MCV: 91.4 fL (ref 78.0–100.0)
Platelets: 121 10*3/uL — ABNORMAL LOW (ref 150–400)
RBC: 3.7 MIL/uL — AB (ref 4.22–5.81)
RDW: 14.6 % (ref 11.5–15.5)
WBC: 6.7 10*3/uL (ref 4.0–10.5)

## 2016-06-29 LAB — BASIC METABOLIC PANEL
ANION GAP: 9 (ref 5–15)
BUN: 10 mg/dL (ref 6–20)
CALCIUM: 8.2 mg/dL — AB (ref 8.9–10.3)
CO2: 23 mmol/L (ref 22–32)
Chloride: 103 mmol/L (ref 101–111)
Creatinine, Ser: 0.99 mg/dL (ref 0.61–1.24)
GLUCOSE: 102 mg/dL — AB (ref 65–99)
POTASSIUM: 3.3 mmol/L — AB (ref 3.5–5.1)
SODIUM: 135 mmol/L (ref 135–145)

## 2016-06-29 LAB — HEPARIN LEVEL (UNFRACTIONATED)
HEPARIN UNFRACTIONATED: 0.25 [IU]/mL — AB (ref 0.30–0.70)
Heparin Unfractionated: 0.15 IU/mL — ABNORMAL LOW (ref 0.30–0.70)

## 2016-06-29 LAB — APTT: APTT: 75 s — AB (ref 24–36)

## 2016-06-29 MED ORDER — SODIUM CHLORIDE 0.9% FLUSH
3.0000 mL | Freq: Two times a day (BID) | INTRAVENOUS | Status: DC
Start: 2016-06-29 — End: 2016-07-02
  Administered 2016-07-01 (×2): 3 mL via INTRAVENOUS

## 2016-06-29 MED ORDER — SODIUM CHLORIDE 0.9% FLUSH: 3.0000 mL | INTRAVENOUS | Status: DC | PRN

## 2016-06-29 MED ORDER — SODIUM CHLORIDE 0.9 % IV SOLN: 250.0000 mL | INTRAVENOUS | Status: DC | PRN

## 2016-06-29 MED ORDER — SALINE SPRAY 0.65 % NA SOLN
1.0000 | NASAL | Status: DC | PRN
  Administered 2016-06-29: 1 via NASAL
  Filled 2016-06-29: qty 44

## 2016-06-29 MED ORDER — LORATADINE 10 MG PO TABS
10.0000 mg | ORAL_TABLET | Freq: Every day | ORAL | Status: DC | PRN
  Administered 2016-06-29: 10 mg via ORAL
  Filled 2016-06-29 (×2): qty 1

## 2016-06-29 NOTE — Progress Notes (Signed)
ANTICOAGULATION CONSULT NOTE - FOLLOW UP  Pharmacy Consult:  Heparin Indication: atrial fibrillation  Allergies  Allergen Reactions  . Codeine     REACTION: nausea    Patient Measurements: Height: 5\' 6"  (167.6 cm) Weight: 195 lb (88.5 kg) IBW/kg (Calculated) : 63.8 Heparin Dosing Weight: 83 kg  Vital Signs: Temp: 98.9 F (37.2 C) (08/13 0500) Temp Source: Oral (08/13 0500) BP: 170/68 (08/13 0500) Pulse Rate: 70 (08/13 0500)  Labs:  Recent Labs  06/27/16 0242  06/28/16 0437 06/28/16 1033 06/28/16 1955 06/29/16 0530  HGB 11.4*  --  11.2*  --   --   --   HCT 35.7*  --  34.3*  --   --   --   PLT 133*  --  124*  --   --   --   APTT  --   < > 143*  --  77* 75*  HEPARINUNFRC  --   < > 0.39  --  0.14* 0.15*  CREATININE 0.99  --   --  1.06  --   --   < > = values in this interval not displayed.  Estimated Creatinine Clearance: 45.4 mL/min (by C-G formula based on SCr of 1.06 mg/dL).    Assessment: 88 YOM with history of Afib on Xarelto PTA.  Patient was transitioned to heparin SQ on admission due to SBO/NPO status, and then changed to full heparin bridge on 06/27/16.  Patient last took Xarelto on 06/23/16. Heparin level no longer being affected by Xarelto. Heparin level 0.15 (subtherapeutic) on 950 units/hr. No issues with line or bleeding reported per RN.  Goal of Therapy:  Heparin level 0.3-0.7 units/ml  Monitor platelets by anticoagulation protocol: Yes   Plan:  - Increase heparin gtt to 1200 units/hr - Check 8 hr HL  [name], PharmD, BCPS Clinical pharmacist, pager [phone] 06/29/2016 6:45 AM

## 2016-06-29 NOTE — Progress Notes (Signed)
ANTICOAGULATION CONSULT NOTE - FOLLOW UP  Pharmacy Consult:  Heparin Indication: atrial fibrillation  Allergies  Allergen Reactions  . Codeine     REACTION: nausea    Patient Measurements: Height: 5\' 6"  (167.6 cm) Weight: 195 lb (88.5 kg) IBW/kg (Calculated) : 63.8 Heparin Dosing Weight: 83 kg  Vital Signs: Temp: 98.2 F (36.8 C) (08/13 1541) Temp Source: Oral (08/13 1541) BP: 166/71 (08/13 1541) Pulse Rate: 75 (08/13 1541)  Labs:  Recent Labs  06/27/16 0242  06/28/16 0437 06/28/16 1033 06/28/16 1955 06/29/16 0530 06/29/16 0545 06/29/16 1511  HGB 11.4*  --  11.2*  --   --  11.0*  --   --   HCT 35.7*  --  34.3*  --   --  33.8*  --   --   PLT 133*  --  124*  --   --  121*  --   --   APTT  --   < > 143*  --  77* 75*  --   --   HEPARINUNFRC  --   < > 0.39  --  0.14* 0.15*  --  0.25*  CREATININE 0.99  --   --  1.06  --   --  0.99  --   < > = values in this interval not displayed.  Estimated Creatinine Clearance: 48.6 mL/min (by C-G formula based on SCr of 0.99 mg/dL).    Assessment: 65 YOM with history of Afib on Xarelto PTA.  Patient was transitioned to heparin SQ on admission due to SBO/NPO status, and then changed to full heparin bridge on 06/27/16.  Patient last took Xarelto on 06/23/16. Heparin level no longer being affected by Xarelto. Heparin level 0.25 (subtherapeutic) on 1200 units/hr. Spoke with nurse and no bleeding noted. CBC stable.   Goal of Therapy:  Heparin level 0.3-0.7 units/ml  Monitor platelets by anticoagulation protocol: Yes   Plan:  - Increase heparin gtt to 1350 units/hr - Check 8 hr HL - Daily HL and CBC - S/sx of bleeding   Uvaldo Bristle, Sherian Rein D Pharmacy Resident , pager [phone] 06/29/2016 4:28 PM

## 2016-06-29 NOTE — Progress Notes (Signed)
Family Medicine Teaching Service Daily Progress Note Intern Pager: [phone]  Patient name: [name] Medical record number: [id number] Date of birth: Dec 11, 1919 Age: 82 y.o. Gender: male  Primary Care Provider: [name], MD Consultants: none Code Status: DNR  Pt Overview and Major Events to Date:  Admit 8/8  Assessment and Plan: [name] is a 82 y.o. male presenting with nausea and abd distention. PMH is significant for previous SBO, hx of esophageal strictures, HTN, T2DM, GERD, gout, h/o diverticulosis, CKD Stage III, Afib on xarelto, BPH.  Partial small bowel obstruction. - Still reports some abd pain butimproving abd distension and SOB after being made NPO yesterday and s/p IV lasix 40 x1. Passing flatus and had two BMs in the last 24 hours. Desires to remain NPO today to continue to rest bowel. KUB on 8/11 concerning for worsening, CT abd showed resolved SBO - Advance slowly to clears today - continue to hold home meds - gentle fluid hydration, D/c if tolerates PO well - consider NG tube placement for gastric decompression if worsens and begins to have significant emesis  - Zofran 4mg  IV q6h prn nausea - pain control morphine 2mg  q4prn - IV Pepcid in place of home PPI  -Close follow up with GI - Dr. Hilarie Fredrickson (seen in Feb/2017). Called office for outpatient follow-up care, appointment for 8/18 1.30pm.  Resp: Has baseline SOB at night with. Given body habitus there is concern for OSA. He doesnot use CPAP at home - supp O2 as needed - consider work up as an outpatient  BPH: - hold home rapaflow until tolerating better PO -bladder scan shows no urine in bladder. No cath needed for now.  -watch closely for urinary retention given significant BPH  CKD:  Cr on admission 1.46. Improved to 0.99 yesterday - f/u BMP today   Hyponatremia: Resolved. Am Na 136. <-- 132 at admission. Likely related to hypovolemia.  - f/u BMP this AM -corrected with NS   Afib - rate  controled - holding home multaq, nebovolol, xarelto - continue lopressor 2.5 q6 - start heparin treatment as unclear when will tolerate PO  HTN BPs elevated overnight and improved with stopping IVF - restart home antihypertensives as tolerating PO  T2DM last a1c 12/06/14 was 6.0  H/o HFpEF Last echo was 2006 with preserved EF and currently euvolemic -At home on torsemide mondays and fridays.this has been held. Weight 195--> 197 as pateitn does not look significanltly fluid up and night time SOB is at baseline there is concern that his first weight 189 was inaccurate.  - s/p Lasix 40 mg IV x1 on Friday 8/11 with good diuresis - will give IV lasix on Monday if still NPO and cannot take home torsemde - Given he is NPO and it is unclear when he will tolerate PO, will start gentle fluid D5NS at 50 cc/hr - daily weights - strict I/O  FEN/GI: clears, IVF 50 Prophylaxis: heparin  Disposition: admitted to [facility] teaching service   Subjective: Last BM yestderday, passing gas. Feels abd less distended still feels " rumbly". Denies nausea/emesis. Breathing improved this AM. Denies chest pain  Objective: Temp:  [97.6 F (36.4 C)-98.9 F (37.2 C)] 98.9 F (37.2 C) (08/13 0500) Pulse Rate:  [70-77] 70 (08/13 0500) Resp:  [16-17] 16 (08/13 0500) BP: (170-177)/(68-82) 170/68 (08/13 0500) SpO2:  [95 %-96 %] 96 % (08/13 0500)  General: sitting in chair, pleasant, in NAD Eyes: EOMI ENTM: dry MM, hard of hearing Cardiovascular: RRR, no murmurs  noted, trace LE edema to ankles bilaterally Respiratory: CTAB, normal effort, no wheezes or crackles  Abdomen: less tense and distended than yesterday, soft, nontender, +BS concern for some element of high-pitched quality  Skin: no rashes noted Extremities: no LE edema Neuro: AOx3 Psych: appropriate mood and affect  Laboratory:  Recent Labs Lab 06/27/16 0242 06/28/16 0437 06/29/16 0530  WBC 9.4 7.2 6.7  HGB 11.4* 11.2* 11.0*  HCT 35.7* 34.3*  33.8*  PLT 133* 124* 121*    Recent Labs Lab 06/24/16 1549  06/26/16 0540 06/27/16 0242 06/28/16 1033  NA 132*  < > 138 136 134*  K 4.5  < > 4.3 4.1 3.4*  CL 97*  < > 107 105 100*  CO2 24  < > 26 24 23   BUN 21*  < > 10 7 9   CREATININE 1.46*  < > 1.08 0.99 1.06  CALCIUM 9.4  < > 8.4* 8.2* 8.5*  PROT 7.0  --   --   --   --   BILITOT 1.0  --   --   --   --   ALKPHOS 50  --   --   --   --   ALT 23  --   --   --   --   AST 22  --   --   --   --   GLUCOSE 146*  < > 126* 136* 110*  < > = values in this interval not displayed.  Imaging/Diagnostic Tests:  Dg Chest 2 View  Result Date: 06/28/2016 CLINICAL DATA:  Shortness of breath, abdominal issues question small bowel obstruction, history hypertension, atrial fibrillation, GERD, type II diabetes mellitus EXAM: CHEST  2 VIEW COMPARISON:  06/24/2016 FINDINGS: Left subclavian sequential transvenous pacemaker leads project at right atrium and right ventricle. Enlargement of cardiac silhouette with pulmonary vascular congestion. Emphysematous and minimal bronchitic changes consistent with COPD. Small RIGHT pleural effusion with minimal bibasilar atelectasis. Slight interstitial prominence versus the previous study could represent minimal edema. No segmental consolidation or pneumothorax. Bones demineralized. IMPRESSION: Enlargement of cardiac silhouette post pacemaker. COPD changes with small RIGHT pleural effusion, mild basilar atelectasis and question minimal pulmonary edema Electronically Signed   By: Lavonia Dana M.D.   On: 06/28/2016 13:05      Veatrice Bourbon, MD 06/29/2016, 7:02 AM PGY-3, [place] Intern pager: [phone], text pages welcome

## 2016-06-30 ENCOUNTER — Encounter (HOSPITAL_COMMUNITY): Payer: Self-pay

## 2016-06-30 DIAGNOSIS — R0602 Shortness of breath: Secondary | ICD-10-CM

## 2016-06-30 LAB — CBC
HCT: 37 % — ABNORMAL LOW (ref 39.0–52.0)
Hemoglobin: 12.6 g/dL — ABNORMAL LOW (ref 13.0–17.0)
MCH: 30.5 pg (ref 26.0–34.0)
MCHC: 34.1 g/dL (ref 30.0–36.0)
MCV: 89.6 fL (ref 78.0–100.0)
Platelets: 150 10*3/uL (ref 150–400)
RBC: 4.13 MIL/uL — ABNORMAL LOW (ref 4.22–5.81)
RDW: 14.3 % (ref 11.5–15.5)
WBC: 9.4 10*3/uL (ref 4.0–10.5)

## 2016-06-30 LAB — BASIC METABOLIC PANEL
Anion gap: 7 (ref 5–15)
BUN: 9 mg/dL (ref 6–20)
CHLORIDE: 99 mmol/L — AB (ref 101–111)
CO2: 24 mmol/L (ref 22–32)
CREATININE: 1.04 mg/dL (ref 0.61–1.24)
Calcium: 8.4 mg/dL — ABNORMAL LOW (ref 8.9–10.3)
GFR calc Af Amer: >60 mL/min (ref >60)
GFR calc non Af Amer: 60 mL/min — ABNORMAL LOW (ref >60)
GLUCOSE: 122 mg/dL — AB (ref 65–99)
POTASSIUM: 3.3 mmol/L — AB (ref 3.5–5.1)
SODIUM: 130 mmol/L — AB (ref 135–145)

## 2016-06-30 LAB — HEPARIN LEVEL (UNFRACTIONATED)
Heparin Unfractionated: 0.43 IU/mL (ref 0.30–0.70)
Heparin Unfractionated: 0.24 IU/mL — ABNORMAL LOW (ref 0.30–0.70)

## 2016-06-30 LAB — APTT: aPTT: 158 seconds — ABNORMAL HIGH (ref 24–36)

## 2016-06-30 MED ORDER — NEBIVOLOL HCL 10 MG PO TABS
10.0000 mg | ORAL_TABLET | Freq: Every day | ORAL | Status: DC
  Administered 2016-06-30 – 2016-07-02 (×3): 10 mg via ORAL
  Filled 2016-06-30 (×3): qty 1

## 2016-06-30 MED ORDER — RAMIPRIL 2.5 MG PO CAPS
2.5000 mg | ORAL_CAPSULE | Freq: Every morning | ORAL | Status: DC
  Administered 2016-06-30 – 2016-07-02 (×3): 2.5 mg via ORAL
  Filled 2016-06-30 (×3): qty 1

## 2016-06-30 MED ORDER — DRONEDARONE HCL 400 MG PO TABS
400.0000 mg | ORAL_TABLET | Freq: Two times a day (BID) | ORAL | Status: DC
  Administered 2016-06-30 – 2016-07-02 (×4): 400 mg via ORAL
  Filled 2016-06-30 (×5): qty 1

## 2016-06-30 MED ORDER — TAMSULOSIN HCL 0.4 MG PO CAPS
0.4000 mg | ORAL_CAPSULE | Freq: Every day | ORAL | Status: DC
  Administered 2016-06-30 – 2016-07-02 (×3): 0.4 mg via ORAL
  Filled 2016-06-30 (×3): qty 1

## 2016-06-30 MED ORDER — RIVAROXABAN 15 MG PO TABS: 15.0000 mg | ORAL_TABLET | Freq: Every day | ORAL | Status: DC

## 2016-06-30 MED ORDER — RIVAROXABAN 15 MG PO TABS
15.0000 mg | ORAL_TABLET | Freq: Every day | ORAL | Status: DC
Start: 2016-06-30 — End: 2016-07-02
  Administered 2016-06-30 – 2016-07-01 (×2): 15 mg via ORAL
  Filled 2016-06-30 (×2): qty 1

## 2016-06-30 MED ORDER — TORSEMIDE 20 MG PO TABS
10.0000 mg | ORAL_TABLET | Freq: Once | ORAL | Status: AC
  Administered 2016-06-30: 10 mg via ORAL
  Filled 2016-06-30: qty 1

## 2016-06-30 MED ORDER — POTASSIUM CHLORIDE CRYS ER 20 MEQ PO TBCR
40.0000 meq | EXTENDED_RELEASE_TABLET | Freq: Once | ORAL | Status: AC
  Administered 2016-06-30: 40 meq via ORAL
  Filled 2016-06-30: qty 2

## 2016-06-30 NOTE — Progress Notes (Signed)
ANTICOAGULATION CONSULT NOTE - FOLLOW UP  Pharmacy Consult:  Heparin Indication: atrial fibrillation  Allergies  Allergen Reactions  . Codeine     REACTION: nausea    Patient Measurements: Height: 5\' 6"  (167.6 cm) Weight: 195 lb (88.5 kg) IBW/kg (Calculated) : 63.8 Heparin Dosing Weight: 83 kg  Vital Signs: Temp: 97.7 F (36.5 C) (08/13 2205) Temp Source: Oral (08/13 2205) BP: 172/84 (08/13 2205) Pulse Rate: 79 (08/13 2205)  Labs:  Recent Labs  06/28/16 0437 06/28/16 1033 06/28/16 1955 06/29/16 0530 06/29/16 0545 06/29/16 1511 06/30/16 0046  HGB 11.2*  --   --  11.0*  --   --  12.6*  HCT 34.3*  --   --  33.8*  --   --  37.0*  PLT 124*  --   --  121*  --   --  150  APTT 143*  --  77* 75*  --   --   --   HEPARINUNFRC 0.39  --  0.14* 0.15*  --  0.25* 0.43  CREATININE  --  1.06  --   --  0.99  --  1.04    Estimated Creatinine Clearance: 46.3 mL/min (by C-G formula based on SCr of 1.04 mg/dL).  Assessment: 79 YOM with history of Afib on Xarelto PTA.  Patient was transitioned to heparin SQ on admission due to SBO/NPO status, and then changed to full heparin bridge on 06/27/16.  Patient last took Xarelto on 06/23/16. Heparin level no longer being affected by Xarelto. Heparin level therapeutic on 1350 units/hr. CBC stable. No bleeding noted.  Goal of Therapy:  Heparin level 0.3-0.7 units/ml  Monitor platelets by anticoagulation protocol: Yes   Plan:  - Continue heparin gtt at 1350 units/hr - Daily HL and CBC  [name], PharmD, BCPS Clinical pharmacist, pager [phone] 06/30/2016 1:26 AM

## 2016-06-30 NOTE — Progress Notes (Signed)
Family Medicine Teaching Service Daily Progress Note Intern Pager: [phone]  Patient name: [name] Medical record number: [id number] Date of birth: 03-Feb-1920 Age: 82 y.o. Gender: male  Primary Care Provider: [name], MD Consultants: none Code Status: DNR  Pt Overview and Major Events to Date:  Admit 8/8  Assessment and Plan: [name] is a 82 y.o. male presenting with nausea and abd distention. PMH is significant for previous SBO, hx of esophageal strictures, HTN, T2DM, GERD, gout, h/o diverticulosis, CKD Stage III, Afib on xarelto, BPH.  Partial small bowel obstruction. - No longer having abd pain after being made NPO again on 8/12 and s/p IV lasix 40 x1. Passing flatus and continues to have BMs in the last 24 hours. Desired to remain NPO 8/13 to continue to rest bowel. KUB on 8/11 concerning for worsening, CT abd showed resolved SBO. - Advanced to clears yesterday, advance to full liquid diet today -restart home meds today once tolerating po (Rapaflo, multaq, nebovolol, xarelto) - was receiving gentle fluid hydration, fluids KVO'd last night bc tolerating well  - consider NG tube placement for gastric decompression if worsens and begins to have significant emesis  - Zofran 4mg  IV q6h prn nausea - pain control morphine 2mg  q4prn - IV Pepcid in place of home PPI  -Close follow up with GI - Dr. Hilarie Fredrickson (seen in Feb/2017). Called office for outpatient follow-up care, appointment for 8/18 1.30pm.  Resp: Has baseline SOB at night. Given body habitus there is concern for OSA. He does not use CPAP at home - supp O2 as needed - consider work up as an outpatient  BPH: - restart home rapaflow when tolerating better PO -bladder scan shows no urine in bladder. No cath needed for now.  -watch closely for urinary retention given significant BPH  CKD:  Cr on admission 1.46. 1.04 this am. - Continue to trend Cr  Hyponatremia: Na+ 130 this am likely related to hypovolemia.  -  f/u BMP this AM -correct with NS  Afib - rate controlled - restart home multaq, nebovolol, Xarelto given tolerating po - d/c IV Lopressor - can stop heparin once home meds restarted  HTN BPs elevated overnight and improved with stopping IVF - restart home antihypertensives as tolerating PO   T2DM last a1c 12/06/14 was 6.0 -CBGs stable ~110s  H/o HFpEF Last echo was 2006 with preserved EF and currently euvolemic -At home on torsemide mondays and fridays. This has been held. Weight 194--> 197 as patient does not look significantlly fluid up and night time SOB is at baseline there is concern that his first weight 189 was inaccurate.  - s/p Lasix 40 mg IV x1 on Friday 8/11 with good diuresis - can give home Torsemide today if tolerating - daily weights  - strict I/O  FEN/GI: clears transitioning to full liquid today, IVF KVO Prophylaxis: heparin  Disposition: pending clinical improvement  Subjective: Last BM yestderday, passing gas. Feels abd less distended still feels " rumbly". Denies nausea/emesis. Breathing improved this AM. Denies chest pain.  Objective: Temp:  [97.7 F (36.5 C)-98.2 F (36.8 C)] 97.9 F (36.6 C) (08/14 0502) Pulse Rate:  [75-85] 85 (08/14 0502) Resp:  [18] 18 (08/13 1541) BP: (166-172)/(71-84) 170/73 (08/14 0502) SpO2:  [96 %-98 %] 96 % (08/14 0502) Weight:  [194 lb 12.8 oz (88.4 kg)] 194 lb 12.8 oz (88.4 kg) (08/14 0502)  General: sitting in chair, pleasant, in NAD Eyes: EOMI ENTM: dry MM, hard of hearing Cardiovascular: RRR,  no murmurs noted, trace LE edema to ankles bilaterally Respiratory: CTAB, normal effort, no wheezes or crackles  Abdomen: less tense and distended than yesterday, soft, nontender, +BS concern for some element of high-pitched quality  Skin: no rashes noted Extremities: no LE edema Neuro: AOx3 Psych: appropriate mood and affect  Laboratory:  Recent Labs Lab 06/28/16 0437 06/29/16 0530 06/30/16 0046  WBC 7.2 6.7 9.4   HGB 11.2* 11.0* 12.6*  HCT 34.3* 33.8* 37.0*  PLT 124* 121* 150    Recent Labs Lab 06/24/16 1549  06/28/16 1033 06/29/16 0545 06/30/16 0046  NA 132*  < > 134* 135 130*  K 4.5  < > 3.4* 3.3* 3.3*  CL 97*  < > 100* 103 99*  CO2 24  < > 23 23 24   BUN 21*  < > 9 10 9   CREATININE 1.46*  < > 1.06 0.99 1.04  CALCIUM 9.4  < > 8.5* 8.2* 8.4*  PROT 7.0  --   --   --   --   BILITOT 1.0  --   --   --   --   ALKPHOS 50  --   --   --   --   ALT 23  --   --   --   --   AST 22  --   --   --   --   GLUCOSE 146*  < > 110* 102* 122*  < > = values in this interval not displayed.  Imaging/Diagnostic Tests:  No results found.    Lovenia Kim, MD 06/30/2016, 8:27 AM PGY-1, [place] Intern pager: [phone], text pages welcome

## 2016-06-30 NOTE — Progress Notes (Addendum)
ANTICOAGULATION CONSULT NOTE - FOLLOW UP  Pharmacy Consult:  Heparin Indication: atrial fibrillation  Allergies  Allergen Reactions  . Codeine     REACTION: nausea    Patient Measurements: Height: 5\' 6"  (167.6 cm) Weight: 194 lb 12.8 oz (88.4 kg) IBW/kg (Calculated) : 63.8 Heparin Dosing Weight: 83 kg  Vital Signs: Temp: 97.9 F (36.6 C) (08/14 0502) Temp Source: Oral (08/14 0502) BP: 170/73 (08/14 0502) Pulse Rate: 85 (08/14 0502)  Labs:  Recent Labs  06/28/16 0437 06/28/16 1033 06/28/16 1955 06/29/16 0530 06/29/16 0545 06/29/16 1511 06/30/16 0046 06/30/16 1030  HGB 11.2*  --   --  11.0*  --   --  12.6*  --   HCT 34.3*  --   --  33.8*  --   --  37.0*  --   PLT 124*  --   --  121*  --   --  150  --   APTT 143*  --  77* 75*  --   --  158*  --   HEPARINUNFRC 0.39  --  0.14* 0.15*  --  0.25* 0.43 0.24*  CREATININE  --  1.06  --   --  0.99  --  1.04  --     Estimated Creatinine Clearance: 46.2 mL/min (by C-G formula based on SCr of 1.04 mg/dL).   Medications: Infusion . heparin 1,350 Units/hr (06/29/16 1722)    Assessment: 24 YOM with history of Afib on Xarelto PTA.  Patient was transitioned to heparin SQ on admission due to SBO/NPO status, and then changed to full heparin bridge on 06/27/16.  Patient last took Xarelto on 06/23/16. Heparin level no longer being affected by Xarelto.   HL is now subtherapeutic at 0.24 with no change in rate. Spoke with RN who reports no problems with infusion. Hgb improved and platelets up to 150. No bleeding noted.  Goal of Therapy:  Heparin level 0.3-0.7 units/ml  Monitor platelets by anticoagulation protocol: Yes   Plan:  - Increase heparin drip to 1500 units/hr - 8 hr HL - Daily HL and [place], PharmD, BCPS Clinical Pharmacist Pager [phone] Main pharmacy - [phone] 06/30/2016 11:21 AM

## 2016-06-30 NOTE — Discharge Instructions (Addendum)
Information on my medicine - XARELTO (Rivaroxaban)  This medication education was reviewed with me or my healthcare representative as part of my discharge preparation.  The pharmacist that spoke with me during my hospital stay was: Deirdre Pippins, PharmD & Grayling Congress, Student-PharmD  Why was Xarelto prescribed for you? Xarelto was prescribed for you to reduce the risk of a blood clot forming that can cause a stroke if you have a medical condition called atrial fibrillation (a type of irregular heartbeat).  What do you need to know about xarelto ? Take your Xarelto ONCE DAILY at the same time every day with your evening meal. If you have difficulty swallowing the tablet whole, you may crush it and mix in applesauce just prior to taking your dose.  Take Xarelto exactly as prescribed by your doctor and DO NOT stop taking Xarelto without talking to the doctor who prescribed the medication.  Stopping without other stroke prevention medication to take the place of Xarelto may increase your risk of developing a clot that causes a stroke.  Refill your prescription before you run out.  After discharge, you should have regular check-up appointments with your healthcare provider that is prescribing your Xarelto.  In the future your dose may need to be changed if your kidney function or weight changes by a significant amount.  What do you do if you miss a dose? If you are taking Xarelto ONCE DAILY and you miss a dose, take it as soon as you remember on the same day then continue your regularly scheduled once daily regimen the next day. Do not take two doses of Xarelto at the same time or on the same day.   Important Safety Information A possible side effect of Xarelto is bleeding. You should call your healthcare provider right away if you experience any of the following: ? Bleeding from an injury or your nose that does not stop. ? Unusual colored urine (red or dark brown) or unusual colored  stools (red or black). ? Unusual bruising for unknown reasons. ? A serious fall or if you hit your head (even if there is no bleeding).  Some medicines may interact with Xarelto and might increase your risk of bleeding while on Xarelto. To help avoid this, consult your healthcare provider or pharmacist prior to using any new prescription or non-prescription medications, including herbals, vitamins, non-steroidal anti-inflammatory drugs (NSAIDs) and supplements.  This website has more information on Xarelto: [email or web address]

## 2016-06-30 NOTE — Progress Notes (Signed)
Physical Therapy Treatment Patient Details Name: [name] MRN: [id number] DOB: [birth year] Today's Date: 07-13-16    History of Present Illness 82 yo admitted for SBO. PMHx: appendectomy, Afib, pacemaker, CKD, BPH, gout, HTN, DM, GERD    PT Comments    Pt remains pleasant and willing to increase mobility. Pt able to slightly increase gait distance today and tolerate HEP. However, pt fatigued with limited activity performed. Pt with sats 97% on RA throughout and states SOB after gait but able to continue. Pt encouraged to increase ambulation with nursing. Will continue to follow.   Follow Up Recommendations  SNF;Supervision for mobility/OOB     Equipment Recommendations       Recommendations for Other Services       Precautions / Restrictions Precautions Precautions: Fall    Mobility  Bed Mobility               General bed mobility comments: in chair on arrival  Transfers Overall transfer level: Needs assistance   Transfers: Sit to/from Stand Sit to Stand: Min guard         General transfer comment: increased time and repetitions with momentum and cues for sequence before able to achieve standing  Ambulation/Gait Ambulation/Gait assistance: Min guard Ambulation Distance (Feet): 150 Feet Assistive device: Rolling walker (2 wheeled) Gait Pattern/deviations: Step-through pattern;Decreased stride length;Trunk flexed;Shuffle     General Gait Details: cues for posture and position in RW, pt limited by fatigue   Stairs            Wheelchair Mobility    Modified Rankin (Stroke Patients Only)       Balance                                    Cognition Arousal/Alertness: Awake/alert Behavior During Therapy: Flat affect Overall Cognitive Status: Within Functional Limits for tasks assessed                      Exercises General Exercises - Lower Extremity Long Arc Quad: AROM;Both;10 reps;Seated Hip  ABduction/ADduction: AROM;Both;10 reps;Seated Hip Flexion/Marching: AROM;Both;10 reps;Seated Toe Raises: AROM;Both;10 reps;Seated    General Comments        Pertinent Vitals/Pain Pain Assessment: No/denies pain    Home Living                      Prior Function            PT Goals (current goals can now be found in the care plan section) Progress towards PT goals: Progressing toward goals    Frequency       PT Plan Current plan remains appropriate    Co-evaluation             End of Session   Activity Tolerance: Patient tolerated treatment well Patient left: in chair;with call bell/phone within reach;with chair alarm set     Time: [id number] PT Time Calculation (min) (ACUTE ONLY): 15 min  Charges:  $Gait Training: 8-22 mins                    G Codes:      Melford Aase 07-13-16, 12:40 PM Elwyn Reach, [place]

## 2016-06-30 NOTE — Care Management Important Message (Signed)
Important Message  Patient Details  Name: [name] MRN: [id number] Date of Birth: Feb 09, 1920   Medicare Important Message Given:  Yes    Iretha Kirley 06/30/2016, 12:05 PM

## 2016-07-01 ENCOUNTER — Encounter: Payer: Medicare Other | Admitting: Cardiovascular Disease

## 2016-07-01 LAB — BASIC METABOLIC PANEL
Anion gap: 8 (ref 5–15)
BUN: 11 mg/dL (ref 6–20)
CALCIUM: 8.4 mg/dL — AB (ref 8.9–10.3)
CO2: 27 mmol/L (ref 22–32)
CREATININE: 1.12 mg/dL (ref 0.61–1.24)
Chloride: 100 mmol/L — ABNORMAL LOW (ref 101–111)
GFR calc Af Amer: >60 mL/min (ref >60)
GFR, EST NON AFRICAN AMERICAN: 55 mL/min — AB (ref >60)
GLUCOSE: 104 mg/dL — AB (ref 65–99)
POTASSIUM: 3.1 mmol/L — AB (ref 3.5–5.1)
SODIUM: 135 mmol/L (ref 135–145)

## 2016-07-01 LAB — CBC
HCT: 33.1 % — ABNORMAL LOW (ref 39.0–52.0)
Hemoglobin: 10.9 g/dL — ABNORMAL LOW (ref 13.0–17.0)
MCH: 29.3 pg (ref 26.0–34.0)
MCHC: 32.9 g/dL (ref 30.0–36.0)
MCV: 89 fL (ref 78.0–100.0)
PLATELETS: 148 10*3/uL — AB (ref 150–400)
RBC: 3.72 MIL/uL — ABNORMAL LOW (ref 4.22–5.81)
RDW: 14.3 % (ref 11.5–15.5)
WBC: 5.7 10*3/uL (ref 4.0–10.5)

## 2016-07-01 MED ORDER — FAMOTIDINE 20 MG PO TABS
20.0000 mg | ORAL_TABLET | Freq: Two times a day (BID) | ORAL | Status: DC
  Administered 2016-07-01 – 2016-07-02 (×2): 20 mg via ORAL
  Filled 2016-07-01 (×2): qty 1

## 2016-07-01 MED ORDER — POTASSIUM CHLORIDE CRYS ER 20 MEQ PO TBCR
20.0000 meq | EXTENDED_RELEASE_TABLET | Freq: Once | ORAL | Status: AC
  Administered 2016-07-01: 20 meq via ORAL
  Filled 2016-07-01: qty 1

## 2016-07-01 MED ORDER — POTASSIUM CHLORIDE CRYS ER 20 MEQ PO TBCR
20.0000 meq | EXTENDED_RELEASE_TABLET | Freq: Once | ORAL | Status: AC
Start: 2016-07-01 — End: 2016-07-01
  Administered 2016-07-01: 20 meq via ORAL
  Filled 2016-07-01: qty 1

## 2016-07-01 NOTE — Progress Notes (Signed)
FMTS Attending Daily Note:   S:  Patient feels much better today than yesterday.  Still no abd pain.  Hungry, asking for solid food.  Slept better last night.  No episodes of dyspnea  Exam: Gen:  Alert, cooperative patient who appears stated age in no acute distress.  Vital signs reviewed. Cardiac:  Regular rate and rhythm without murmur auscultated.  Good S1/S2. Lungs:  Clear thoughout. Very minimal crackles at bases.  Ext:  +2 edema BL ankles  Imp/Plan: 1. SBO: - resolved.  Back on home meds - advance to full diet  2.  Dyspnea: - also resolved. - Continue diuretics one more day - Can resume his home diuretic dosing afterwards  3.  LE edema: - more likely venous insufficiency.  - Elevate legs  4.  Dispo: - plan to DC to SNF.  C/s SW.  If he tolerates food, he'll be medically ready this PM.    Alveda Reasons, MD 07/01/2016 9:52 AM

## 2016-07-01 NOTE — Progress Notes (Signed)
Family Medicine Teaching Service Daily Progress Note Intern Pager: [phone]  Patient name: [name] Medical record number: [id number] Date of birth: 03-22-1920 Age: 82 y.o. Gender: male  Primary Care Provider: [name], MD Consultants: none Code Status: DNR  Pt Overview and Major Events to Date:  Admit 8/8  Assessment and Plan: [name] is a 82 y.o. male presenting with nausea and abd distention. PMH is significant for previous SBO, hx of esophageal strictures, HTN, T2DM, GERD, gout, h/o diverticulosis, CKD Stage III, Afib on xarelto, BPH.  Partial small bowel obstruction. - No longer having abd pain after being made NPO again on 8/12 and s/p IV lasix 40 x1. Passing flatus and continues to have BMs in the last 24 hours. Desired to remain NPO 8/13 to continue to rest bowel. KUB on 8/11 concerning for worsening, CT abd showed resolved SBO. - Full liquid diet, plan to advance to soft diet today -restarted home meds 8/14 given tolerating po (Rapaflo, multaq, nebovolol, xarelto) - Fluids KVO - consider NG tube placement for gastric decompression if worsens and begins to have significant emesis  - Zofran 4mg  IV q6h prn nausea - pain control morphine 2mg  q4prn - IV Pepcid in place of home PPI  -Close follow up with GI - Dr. Hilarie Fredrickson (seen in Feb/2017). Called office for outpatient follow-up care, appointment for 8/18 1.30pm.  #Hypokalemia , likely due to restarting home torsemide -K+ 3.1 this am -give 20mg  Kdur twice today per pharm rec -Follow BMET  -consider discharging home on daily Kdur 75meQ for one week, then continue 10 meQ daily (per home)  #Resp: Has baseline SOB at night. Given body habitus there is concern for OSA. He does not use CPAP at home - supp O2 as needed - consider work up as an outpatient  #BPH: - Continue home rapaflow -watch closely for urinary retention given significant BPH  #CKD:  Cr on admission 1.46. 1.12 this am. - Continue to trend  Cr  #Hyponatremia: Na+ 135 this am likely related to hypovolemia.  - f/u BMP -correct with NS  #Afib - rate controlled - restarted home multaq, nebovolol, Xarelto given tolerating po - d/c IV Lopressor  #HTN BPs elevated overnight and improved with stopping IVF. BP this am 159/64 - restarted home antihypertensives as he is tolerating PO   #T2DM last a1c 12/06/14 was 6.0 -CBGs stable ~110s  #H/o HFpEF Last echo was 2006 with preserved EF and currently euvolemic -At home on torsemide mondays and fridays. - Continue home torsemide  #Social work -SNF per PT recc -bed available at [facility], likely discharge tomorrow if clinically well  FEN/GI: soft diet, IVF KVO Prophylaxis: heparin  Disposition: SNF pending clinical improvement on torsemide today. CM and SW following.  Subjective: Last BM yesterday, has been passing gas. Feels abd less distended and states he had a good night.  Denies nausea/emesis. Breathing improved. Tolerated full liquid diet.   Objective: Temp:  [97 F (36.1 C)-98.4 F (36.9 C)] 97.6 F (36.4 C) (08/15 0627) Pulse Rate:  [69-77] 72 (08/15 0627) Resp:  [18] 18 (08/15 0627) BP: (146-160)/(64-66) 159/64 (08/15 0627) SpO2:  [97 %-99 %] 98 % (08/15 0627)  General: sitting in chair, pleasant, in NAD Eyes: EOMI ENTM: dry MM, hard of hearing Cardiovascular: RRR, no murmurs noted, trace LE edema to ankles bilaterally Respiratory: CTAB, normal effort, no wheezes or crackles  Abdomen: less tense and distended than yesterday, soft, nontender, +BS  Skin: no rashes noted Extremities: no LE edema  Neuro: AOx3 Psych: appropriate mood and affect  Laboratory:  Recent Labs Lab 06/29/16 0530 06/30/16 0046 07/01/16 0503  WBC 6.7 9.4 5.7  HGB 11.0* 12.6* 10.9*  HCT 33.8* 37.0* 33.1*  PLT 121* 150 148*    Recent Labs Lab 06/24/16 1549  06/29/16 0545 06/30/16 0046 07/01/16 0503  NA 132*  < > 135 130* 135  K 4.5  < > 3.3* 3.3* 3.1*  CL 97*  < > 103  99* 100*  CO2 24  < > 23 24 27   BUN 21*  < > 10 9 11   CREATININE 1.46*  < > 0.99 1.04 1.12  CALCIUM 9.4  < > 8.2* 8.4* 8.4*  PROT 7.0  --   --   --   --   BILITOT 1.0  --   --   --   --   ALKPHOS 50  --   --   --   --   ALT 23  --   --   --   --   AST 22  --   --   --   --   GLUCOSE 146*  < > 102* 122* 104*  < > = values in this interval not displayed.  Imaging/Diagnostic Tests:  No results found.    Lovenia Kim, MD 07/01/2016, 8:28 AM PGY-1, [place] Intern pager: [phone], text pages welcome

## 2016-07-02 ENCOUNTER — Ambulatory Visit: Payer: Medicare Other | Admitting: Family Medicine

## 2016-07-02 ENCOUNTER — Ambulatory Visit (INDEPENDENT_AMBULATORY_CARE_PROVIDER_SITE_OTHER): Payer: Medicare Other | Admitting: *Deleted

## 2016-07-02 DIAGNOSIS — I495 Sick sinus syndrome: Secondary | ICD-10-CM | POA: Diagnosis not present

## 2016-07-02 LAB — BASIC METABOLIC PANEL
Anion gap: 7 (ref 5–15)
BUN: 12 mg/dL (ref 6–20)
CALCIUM: 8.3 mg/dL — AB (ref 8.9–10.3)
CHLORIDE: 101 mmol/L (ref 101–111)
CO2: 26 mmol/L (ref 22–32)
CREATININE: 1.18 mg/dL (ref 0.61–1.24)
GFR calc Af Amer: 59 mL/min — ABNORMAL LOW (ref >60)
GFR, EST NON AFRICAN AMERICAN: 51 mL/min — AB (ref >60)
Glucose, Bld: 105 mg/dL — ABNORMAL HIGH (ref 65–99)
Potassium: 3.5 mmol/L (ref 3.5–5.1)
SODIUM: 134 mmol/L — AB (ref 135–145)

## 2016-07-02 LAB — CBC
HCT: 33.7 % — ABNORMAL LOW (ref 39.0–52.0)
Hemoglobin: 11.1 g/dL — ABNORMAL LOW (ref 13.0–17.0)
MCH: 29.8 pg (ref 26.0–34.0)
MCHC: 32.9 g/dL (ref 30.0–36.0)
MCV: 90.3 fL (ref 78.0–100.0)
PLATELETS: 155 10*3/uL (ref 150–400)
RBC: 3.73 MIL/uL — ABNORMAL LOW (ref 4.22–5.81)
RDW: 14.4 % (ref 11.5–15.5)
WBC: 6.7 10*3/uL (ref 4.0–10.5)

## 2016-07-02 MED ORDER — IOPAMIDOL (ISOVUE-300) INJECTION 61%
100.0000 mL | Freq: Once | INTRAVENOUS | Status: AC | PRN
  Administered 2016-06-27: 100 mL via INTRAVENOUS

## 2016-07-02 NOTE — Care Management Note (Addendum)
Case Management Note  Patient Details  Name: [name] MRN: [id number] Date of Birth: 1919-11-26  Subjective/Objective:   82 y.o M admitted for SBO 06/24/2016. Resolved per MD note 07/01/2016. Diet Advanced to Soft  07/01/16 and plan is for D/C to SNF if tolerated. Alerted Lubertha Sayres, CSW of record. [phone].                  Action/Plan: Discharge to SNF. CM will be available should additional discharge/disposition needs arise.     Expected Discharge Date:                  Expected Discharge Plan:     In-House Referral:     Discharge planning Services     Post Acute Care Choice:    Choice offered to:     DME Arranged:    DME Agency:     HH Arranged:    HH Agency:     Status of Service:     If discussed at [employer], dates discussed:    Additional Comments:  Delrae Sawyers, RN 07/02/2016, 9:48 AM

## 2016-07-02 NOTE — Discharge Summary (Signed)
SW spoke with heather of [facility] who that pt is welcomed to come to facility. SW made nurse aware. SW made daughter in law aware. Daughter in law states that she will transport pt to facility.  Nurse Report Number: [phone] Room: [address] Social Worker [phone]

## 2016-07-02 NOTE — Progress Notes (Signed)
FMTS Attending Daily Note:   S:  Much improved still.  Breathing is at his baseline.  No O2.  Tolerating full diet.  No further abdominal pain.    Exam: Gen:  Alert, cooperative patient who appears stated age in no acute distress.  Vital signs reviewed.  Sitting in chair, comfortable appearing.   Heart: RRR Lungs:  Very minimal crackles at bases. Abd:  Benign/good BS/NT Ext:  +2 edema ankles BL  Imp/Plan:   1.  SBO:  - full resolved, full diet. - No further pain.    2.  Respiratory distress: - also resolved.   - no O2 requirement.  Crackles more likely atelectasis than actual rales from fluid overload.  3.  Dispo:   - plan DC to SNF today.     Alveda Reasons, MD 07/02/2016 1:21 PM

## 2016-07-02 NOTE — Progress Notes (Signed)
Patient discharged to [facility] facility as ordered. Patient was transported by daughter in law, all belongings taken by family. Report was given to nurse Aldona Lento.

## 2016-07-03 ENCOUNTER — Encounter: Payer: Self-pay | Admitting: Cardiology

## 2016-07-03 NOTE — Progress Notes (Signed)
Remote pacemaker transmission.   

## 2016-07-04 ENCOUNTER — Ambulatory Visit: Payer: Medicare Other | Admitting: Gastroenterology

## 2016-07-22 LAB — CUP PACEART REMOTE DEVICE CHECK
Battery Remaining Longevity: 104 mo
Battery Voltage: 2.79 V
Brady Statistic AP VP Percent: 98 %
Brady Statistic AS VP Percent: 2 %
Date Time Interrogation Session: 20170816204515
Implantable Lead Implant Date: 20061128
Implantable Lead Location: 753859
Implantable Lead Model: 4592
Lead Channel Impedance Value: 499 Ohm
Lead Channel Pacing Threshold Amplitude: 0.5 V
Lead Channel Setting Pacing Amplitude: 2 V
Lead Channel Setting Pacing Amplitude: 2.5 V
Lead Channel Setting Pacing Pulse Width: 0.4 ms
MDC IDC LEAD IMPLANT DT: 20061128
MDC IDC LEAD LOCATION: 753860
MDC IDC MSMT BATTERY IMPEDANCE: 184 Ohm
MDC IDC MSMT LEADCHNL RA PACING THRESHOLD PULSEWIDTH: 0.4 ms
MDC IDC MSMT LEADCHNL RV IMPEDANCE VALUE: 623 Ohm
MDC IDC MSMT LEADCHNL RV PACING THRESHOLD AMPLITUDE: 0.75 V
MDC IDC MSMT LEADCHNL RV PACING THRESHOLD PULSEWIDTH: 0.4 ms
MDC IDC SET LEADCHNL RV SENSING SENSITIVITY: 4 mV
MDC IDC STAT BRADY AP VS PERCENT: 0 %
MDC IDC STAT BRADY AS VS PERCENT: 0 %

## 2016-07-28 ENCOUNTER — Telehealth: Payer: Self-pay | Admitting: Family Medicine

## 2016-07-28 NOTE — Telephone Encounter (Signed)
Called and verbal order given

## 2016-07-28 NOTE — Telephone Encounter (Signed)
[facility]: needs verbal orders for home physical therapy for 2 times per week /4 weeks

## 2016-07-30 ENCOUNTER — Encounter: Payer: Self-pay | Admitting: Cardiovascular Disease

## 2016-07-30 ENCOUNTER — Ambulatory Visit (INDEPENDENT_AMBULATORY_CARE_PROVIDER_SITE_OTHER): Payer: Medicare Other | Admitting: Cardiovascular Disease

## 2016-07-30 VITALS — BP 150/92 | HR 93 | Ht 66.0 in | Wt 190.4 lb

## 2016-07-30 DIAGNOSIS — I480 Paroxysmal atrial fibrillation: Secondary | ICD-10-CM | POA: Diagnosis not present

## 2016-07-30 DIAGNOSIS — Z950 Presence of cardiac pacemaker: Secondary | ICD-10-CM

## 2016-07-30 DIAGNOSIS — I495 Sick sinus syndrome: Secondary | ICD-10-CM

## 2016-07-30 DIAGNOSIS — Z7901 Long term (current) use of anticoagulants: Secondary | ICD-10-CM

## 2016-07-30 DIAGNOSIS — I441 Atrioventricular block, second degree: Secondary | ICD-10-CM | POA: Diagnosis not present

## 2016-07-30 NOTE — Progress Notes (Signed)
Patient ID: [name], male   DOB: 11/27/1919, 82 y.o.   MRN: [id number]     Cardiology Office Note    Date:  07/30/2016   ID:  [name], DOB Apr 21, 1920, MRN [id number]  PCP:  [name], MD  Cardiologist:   Sanda Klein, MD   chief complaint: pacemaker check.   History of Present Illness:  [name] is a 82 y.o. male with paroxysmal atrial fibrillation, sinus node dysfunction, high-grade second-degree AV block, s/p dual chamber pacemaker (2006, generator change 2015). He takes Multaq for AF prevention, is on Xarelto for embolic stroke prevention. He has well controlled HTN and mild, diet controlled type 2 DM.  He just had another hospitalization for small bowel obstruction (managed conservatively) and then a rehabilitation stay at [facility]. He's been home for about a week. He is slowly recovering his strength but remains rather frail. He received intravenous diuretics instead of oral torsemide while in the hospital. Antihypertensives were held temporarily and have been restarted, as has his Xarelto. He has not had any bleeding problems. His blood pressure today is borderline high, but his family checks his blood pressure at home and it is typically right around 140/80.  Interrogation of his pacemaker shows normal device function. He has 98% atrial pacing and 100% ventricular pacing. No atrial fibrillation has been seen. He had 114 beat episode of nonsustained ventricular tachycardia at 154 bpm that occurred during his hospitalization in August.  lead parameters are stable, Heart rate histogram is favorable.  Past Medical History:  Diagnosis Date  . Acute gastritis without mention of hemorrhage   . Arthritis   . Atrial fibrillation ([place])   . Chronic kidney disease, stage III (moderate)   . Diverticulosis   . Diverticulosis of colon (without mention of hemorrhage)   . Environmental allergies    HISTORY OF  . Esophageal dysmotility   . Family history of malignant neoplasm  of gastrointestinal tract   . GERD (gastroesophageal reflux disease)   . Gout   . Hiatal hernia   . History of skin cancer   . Hypertension   . Hypertrophy of prostate with urinary obstruction and other lower urinary tract symptoms (LUTS)   . Overactive bladder   . Personal history of colonic polyps 10/21/2006   hyperplastic   . Presence of permanent cardiac pacemaker 10/14/05   medtronic Adapta  . Second degree AV block 11/29/2014  . Sinus node dysfunction ([facility])   . Small bowel obstruction ([place])   . Stricture and stenosis of esophagus   . Type II or unspecified type diabetes mellitus without mention of complication, not stated as uncontrolled    pt states that he checks his blood sugar once weekly, is not on oral meds or insulin  . Unspecified cataract   . Vitamin B12 deficiency     Past Surgical History:  Procedure Laterality Date  . APPENDECTOMY    . CARDIOVERSION  10/01/2011   Procedure: CARDIOVERSION;  Surgeon: Dani Gobble Estee Yohe;  Location: [facility] OR;  Service: Cardiovascular;  Laterality: N/A;  OP CARDIOVERSION  . CARDIOVERSION  11/02/12   successful  . ESOPHAGOGASTRODUODENOSCOPY (EGD) WITH PROPOFOL N/A 09/22/2014   Procedure: ESOPHAGOGASTRODUODENOSCOPY (EGD) WITH PROPOFOL;  Surgeon: Jerene Bears, MD;  Location: [facility] END[facility]COPY;  Service: Gastroenterology;  Laterality: N/A;  . EYE SURGERY Bilateral years ago   cataract lens replacment  . PACEMAKER INSERTION  replaced 2 months ago, total of 8 years with pacemaker  . PERMANENT PACEMAKER GENERATOR CHANGE N/A 03/14/2014  Procedure: PERMANENT PACEMAKER GENERATOR CHANGE;  Surgeon: Sanda Klein, MD; Medtronic Adapta L model number [place], serial number [id number]   . SAVORY DILATION N/A 09/22/2014   Procedure: SAVORY DILATION;  Surgeon: Jerene Bears, MD;  Location: [facility] ENDOSCOPY;  Service: Gastroenterology;  Laterality: N/A;  . TONSILLECTOMY    . TOTAL HIP ARTHROPLASTY Bilateral     Outpatient Medications Prior to Visit  Medication Sig  Dispense Refill  . beta carotene w/minerals (OCUVITE) tablet Take 1 tablet by mouth every morning.     . Blood Glucose Monitoring Suppl (ONE TOUCH ULTRA SYSTEM KIT) w/Device KIT 1 kit by Does not apply route once. 1 each 0  . Cyanocobalamin (VITAMIN B 12 PO) Take 1 capsule by mouth daily.    Marland Kitchen dronedarone (MULTAQ) 400 MG tablet Take 1 tablet (400 mg total) by mouth 2 (two) times daily with a meal. 180 tablet 1  . glucose blood test strip Use as instructed 100 each 12  . Lancets MISC Use with glucometer as directed 100 each 12  . nebivolol (BYSTOLIC) 10 MG tablet Take 1 tablet (10 mg total) by mouth every morning. 90 tablet 3  . pantoprazole (PROTONIX) 40 MG tablet Take 1 tablet (40 mg total) by mouth daily before breakfast. 90 tablet 3  . Polyethyl Glycol-Propyl Glycol (SYSTANE FREE OP) Place 1 drop into both eyes daily as needed. For dry eyes    . polyethylene glycol (MIRALAX / GLYCOLAX) packet Take 17 g by mouth daily. 14 each 0  . Probiotic Product (PHILLIPS COLON HEALTH PO) Take 1 tablet by mouth every morning.     . psyllium (HYDROCIL/METAMUCIL) 95 % PACK Take 1 packet by mouth daily. 56 each 2  . ramipril (ALTACE) 2.5 MG capsule Take 1 capsule (2.5 mg total) by mouth every morning. 90 capsule 3  . Rivaroxaban (XARELTO) 15 MG TABS tablet Take 1 tablet (15 mg total) by mouth at bedtime. 90 tablet 3  . silodosin (RAPAFLO) 4 MG CAPS capsule Alternate taking 1 capsule daily with taking 2 daily 135 capsule 3  . torsemide (DEMADEX) 10 MG tablet One tablet twice a week, Mondays and Fridays. 28 tablet 3  . traMADol (ULTRAM) 50 MG tablet Take 1 tablet (50 mg total) by mouth every 8 (eight) hours as needed for moderate pain. 90 tablet 3   No facility-administered medications prior to visit.      Allergies:   Codeine   Social History   Social History  . Marital status: Widowed    Spouse name: N/A  . Number of children: N/A  . Years of education: N/A   Occupational History  . retired  Retired   Social History Main Topics  . Smoking status: Former Smoker    Packs/day: 1.00    Years: 20.00    Types: Cigarettes    Quit date: 11/17/1969  . Smokeless tobacco: Never Used  . Alcohol use No     Comment: none since 1990  . Drug use: No  . Sexual activity: Not Asked   Other Topics Concern  . None   Social History Narrative   He is going to [facility] in [place].     Family History:  The patient's family history includes Colon cancer (age of onset: 98) in his brother; Hodgkin's lymphoma in his grandchild; Hypertension in his brother and father; Parkinsonism in his mother.   ROS:   Please see the history of present illness.    ROS All other systems reviewed and  are negative.   PHYSICAL EXAM:   VS:  BP (!) 150/92   Pulse 93   Ht 5' 6"  (1.676 m)   Wt 190 lb 6.4 oz (86.4 kg)   BMI 30.73 kg/m    GEN: Well nourished, well developed, in no acute distress HEENT: normal Neck: no JVD, carotid bruits, or masses Cardiac: RRR, possibly split S2; no murmurs, rubs, or gallops,no edema; paradoxically split second heart sound, healthy left subclavian pacemaker site. Respiratory:  clear to auscultation bilaterally, normal work of breathing GI: soft, nontender, nondistended, + BS MS: no deformity or atrophy Skin: warm and dry, no rash Neuro:  Alert and Oriented x 3, Strength and sensation are intact Psych: euthymic mood, full affect  Wt Readings from Last 3 Encounters:  07/30/16 190 lb 6.4 oz (86.4 kg)  07/02/16 193 lb 3.2 oz (87.6 kg)  03/12/16 194 lb 6.4 oz (88.2 kg)      Studies/Labs Reviewed:   EKG:  EKG is not ordered today. Intracardiac electrogram today demonstrates AV sequential pacing Recent Labs: 08/26/2015: Magnesium 1.8 06/24/2016: ALT 23 07/02/2016: BUN 12; Creatinine, Ser 1.18; Hemoglobin 11.1; Platelets 155; Potassium 3.5; Sodium 134   Lipid Panel    Component Value Date/Time   CHOL 152 07/05/2008 2038   TRIG 315 (H) 07/05/2008 2038     HDL 28 (L) 07/05/2008 2038   CHOLHDL 5.4 Ratio 07/05/2008 2038   VLDL 63 (H) 07/05/2008 2038   LDLCALC 61 07/05/2008 2038   LDLDIRECT 84 12/06/2014 1215     ASSESSMENT:    1. Paroxysmal atrial fibrillation (HCC)   2. Second degree AV block   3. Sinoatrial node dysfunction (HCC)   4. Anticoagulant long-term use   5. Pacemaker      PLAN:  In order of problems listed above:  1. AFIb: Very rare episodes on Multaq. Required DCCV in 2013.  CHADSVasc score 4 (age 64, HTN1, DM1).  2. 2nd deg AVB: Cause of high prevalence of ventricular pacing, but not pacemaker dependent 3. SSS: Current sensor settings are appropriate, no evidence to suggest chronotropic incompetence 4. Anticoagulation: Other than the tooth extraction episode, no bleeding problems 5. PPM: Normal pacemaker function, continue remote downloads every 3 months and office visit in 6 months    Medication Adjustments/Labs and Tests Ordered: Current medicines are reviewed at length with the patient today.  Concerns regarding medicines are outlined above.  Medication changes, Labs and Tests ordered today are listed in the Patient Instructions below. Patient Instructions  Dr [name] recommends that you continue on your current medications as directed. Please refer to the Current Medication list given to you today.  Remote monitoring is used to monitor your Pacemaker of ICD from home. This monitoring reduces the number of office visits required to check your device to one time per year. It allows [place] to keep an eye on the functioning of your device to ensure it is working properly. You are scheduled for a device check from home on Wednesday, December 13th, 2017. You may send your transmission at any time that day. If you have a wireless device, the transmission will be sent automatically. After your physician reviews your transmission, you will receive a postcard with your next transmission date.  Dr [name] recommends that you  schedule a follow-up appointment in 6 months with a pacemaker check. You will receive a reminder letter in the mail two months in advance. If you don't receive a letter, please call our office to schedule the follow-up appointment.  If you need a refill on your cardiac medications before your next appointment, please call your pharmacy. Signed, Sanda Klein, MD  07/30/2016 6:11 PM    [facility] [address], [place], [place]  [address] Phone: [phone]; Fax: [phone]

## 2016-07-30 NOTE — Patient Instructions (Signed)
Dr [name] recommends that you continue on your current medications as directed. Please refer to the Current Medication list given to you today.  Remote monitoring is used to monitor your Pacemaker of ICD from home. This monitoring reduces the number of office visits required to check your device to one time per year. It allows [place] to keep an eye on the functioning of your device to ensure it is working properly. You are scheduled for a device check from home on Wednesday, December 13th, 2017. You may send your transmission at any time that day. If you have a wireless device, the transmission will be sent automatically. After your physician reviews your transmission, you will receive a postcard with your next transmission date.  Dr [name] recommends that you schedule a follow-up appointment in 6 months with a pacemaker check. You will receive a reminder letter in the mail two months in advance. If you don't receive a letter, please call our office to schedule the follow-up appointment.  If you need a refill on your cardiac medications before your next appointment, please call your pharmacy.

## 2016-08-13 ENCOUNTER — Ambulatory Visit (INDEPENDENT_AMBULATORY_CARE_PROVIDER_SITE_OTHER): Payer: Medicare Other | Admitting: Family Medicine

## 2016-08-13 ENCOUNTER — Encounter: Payer: Self-pay | Admitting: Family Medicine

## 2016-08-13 DIAGNOSIS — Z23 Encounter for immunization: Secondary | ICD-10-CM

## 2016-08-13 DIAGNOSIS — M25551 Pain in right hip: Secondary | ICD-10-CM | POA: Diagnosis not present

## 2016-08-13 DIAGNOSIS — Z8719 Personal history of other diseases of the digestive system: Secondary | ICD-10-CM | POA: Diagnosis not present

## 2016-08-13 DIAGNOSIS — D518 Other vitamin B12 deficiency anemias: Secondary | ICD-10-CM

## 2016-08-15 MED ORDER — TRAMADOL HCL 50 MG PO TABS: ORAL_TABLET | ORAL | 3 refills | Status: DC

## 2016-08-15 NOTE — Assessment & Plan Note (Signed)
Seems to be back to baseline. Maintaining weight

## 2016-08-15 NOTE — Progress Notes (Signed)
    CHIEF COMPLAINT / HPI: Here for f/u recent d/c SNF after hospitalization for SBO. He is back at home. Brought today by daughter in law. He feels back to baseline. Appetite is baseline ("not what it used to be" which is what he usually says as he compares to many years ago) 2. They changed him to oral B 12 while at [facility] and he has questions 3. Continues to have diffficulty starting and maintaining urinary stream. 4.B owels moving at least once a day but stoll caliber has been less for last 1-2 years and this still concerns him some. 5. Hip pain: not debillitating but bothers him every morning with stiffness; improves as day progresses. Really does not change his acticvities (as "I don't do too much anymore anyway"). Ortho is going to try hip injection later this week. Ortho has said THR is possibiity but his cardiologist does not want him to go that route. Wants my opinion.    REVIEW OF SYSTEMS: See hpi  OBJECTIVE: Vital signs reviewed. GENERAL: Well-developed, well-nourished, no acute distress. CARDIOVASCULAR: Regular rate and rhythm  LUNGS: Clear to auscultation bilaterally, no rales or wheeze. ABDOMEN: Soft positive bowel sounds NEURO: No gross focal neurological deficits. MSK: Movement of extremity x 4.Rises slowly from a chair but uses no assistive devices. Gait is a little antalgic especially initiation of gait. Stride length pretty normal.   ASSESSMENT / PLAN: Please see problem oriented charting for details

## 2016-08-15 NOTE — Assessment & Plan Note (Signed)
Right hip ---not as debillitating as left hip was. Given his age and cardiology comments I too  Recommend continued conservative tx---we increased his available tramadol dose--he just got it filled so will let me know when he is running out as will need new rx. Agree with CSI hip.

## 2016-08-15 NOTE — Assessment & Plan Note (Signed)
Ok to try oral B 12. I will check level at next ov in 4 months or so. I am not sure he will absorb it well but worth a  Try. Saves him inconveneience of office visit...although I think he would be happy to come for the social activity of the visit but family has to bring him so they want to try the oral.

## 2016-08-20 ENCOUNTER — Other Ambulatory Visit: Payer: Self-pay | Admitting: *Deleted

## 2016-08-20 ENCOUNTER — Telehealth: Payer: Self-pay | Admitting: *Deleted

## 2016-08-20 MED ORDER — DRONEDARONE HCL 400 MG PO TABS: 400.0000 mg | ORAL_TABLET | Freq: Two times a day (BID) | ORAL | 1 refills | Status: DC

## 2016-08-20 NOTE — Telephone Encounter (Signed)
Called in to get patient's Multaq refilled, granted refill for 90 day supply.

## 2016-09-19 ENCOUNTER — Other Ambulatory Visit: Payer: Self-pay | Admitting: *Deleted

## 2016-09-22 MED ORDER — TRAMADOL HCL 50 MG PO TABS: ORAL_TABLET | ORAL | 3 refills | Status: DC

## 2016-09-22 NOTE — Telephone Encounter (Signed)
Dear [name] Team Please call this in w three  [place]!

## 2016-09-24 NOTE — Telephone Encounter (Signed)
Called prescription into pharmacy. Nat Christen, CMA

## 2016-09-26 LAB — CUP PACEART INCLINIC DEVICE CHECK
Battery Remaining Longevity: 104 mo
Battery Voltage: 2.8 V
Date Time Interrogation Session: 20170913133618
Implantable Lead Implant Date: 20061128
Implantable Lead Location: 753860
Implantable Lead Model: 4592
Lead Channel Pacing Threshold Amplitude: 0.75 V
Lead Channel Pacing Threshold Pulse Width: 0.4 ms
Lead Channel Setting Pacing Amplitude: 2 V
Lead Channel Setting Pacing Pulse Width: 0.4 ms
MDC IDC LEAD IMPLANT DT: 20061128
MDC IDC LEAD LOCATION: 753859
MDC IDC MSMT BATTERY IMPEDANCE: 184 Ohm
MDC IDC MSMT LEADCHNL RA IMPEDANCE VALUE: 514 Ohm
MDC IDC MSMT LEADCHNL RA PACING THRESHOLD AMPLITUDE: 0.5 V
MDC IDC MSMT LEADCHNL RA PACING THRESHOLD PULSEWIDTH: 0.4 ms
MDC IDC MSMT LEADCHNL RV IMPEDANCE VALUE: 609 Ohm
MDC IDC PG IMPLANT DT: 20150428
MDC IDC SET LEADCHNL RV PACING AMPLITUDE: 2.5 V
MDC IDC SET LEADCHNL RV SENSING SENSITIVITY: 4 mV
MDC IDC STAT BRADY AP VP PERCENT: 98 %
MDC IDC STAT BRADY AP VS PERCENT: 0 %
MDC IDC STAT BRADY AS VP PERCENT: 2 %
MDC IDC STAT BRADY AS VS PERCENT: 0 %

## 2016-10-17 ENCOUNTER — Ambulatory Visit (INDEPENDENT_AMBULATORY_CARE_PROVIDER_SITE_OTHER): Payer: Medicare Other | Admitting: Internal Medicine

## 2016-10-17 ENCOUNTER — Encounter: Payer: Self-pay | Admitting: Internal Medicine

## 2016-10-17 ENCOUNTER — Encounter (INDEPENDENT_AMBULATORY_CARE_PROVIDER_SITE_OTHER): Payer: Self-pay

## 2016-10-17 VITALS — BP 155/88 | HR 88 | Ht 66.0 in | Wt 188.0 lb

## 2016-10-17 DIAGNOSIS — K5909 Other constipation: Secondary | ICD-10-CM

## 2016-10-17 DIAGNOSIS — K648 Other hemorrhoids: Secondary | ICD-10-CM | POA: Diagnosis not present

## 2016-10-17 DIAGNOSIS — Z7901 Long term (current) use of anticoagulants: Secondary | ICD-10-CM | POA: Diagnosis not present

## 2016-10-17 MED ORDER — HYDROCORTISONE ACETATE 25 MG RE SUPP
25.0000 mg | Freq: Two times a day (BID) | RECTAL | 0 refills | Status: DC
Start: 2016-10-17 — End: 2017-01-14

## 2016-10-17 NOTE — Progress Notes (Signed)
Subjective:    Patient ID: [name], male    DOB: September 10, 1920, 82 y.o.   MRN: [id number]  HPI Saint Welp is a 82 year old male with a past medical history of GERD, esophageal dysmotility, colon polyps, significant colonic diverticulosis, constipation who is here for follow-up. He was last seen in February 2017. He is here today with his daughter-in-law.  He reports that over the last month he has noticed red blood with wiping and some red blood in his stool. This is not with every bowel movement but occurring several times per week. This is painless bleeding. Denies. Rectal and anal pain. No consistent issues with abdominal pain. No nausea or vomiting. He and his daughter-in-law states his appetite has been very good. He reports she still has to "fight" constipation and his daughter-in-law states that this means "he needs to take his medicines". He is using MiraLAX 17 g twice daily as well as Metamucil. When using this, accommodation seems to work well for him. He is having several bowel movements per day though he does not always feel completely evacuated.  He continues his Xarelto.  He had another small bowel obstruction since seeing me and this led to a CT scan which performed in August 2017. This was with contrast. I reviewed the study and it showed resolved small bowel obstruction. There is diverticulosis and much of the colon without active inflammation. There was noted the rectum and sigmoid appeared negative aside from diverticulosis. There was a redundant sigmoid and left colon with diverticulosis throughout. Gallbladder had sludge or stones without evidence of acute cholecystitis. There was mild right-sided diverticulosis without inflammation.  Review of Systems As per history of present illness, otherwise negative  Current Medications, Allergies, Past Medical History, Past Surgical History, Family History and Social History were reviewed in [employer]  record.     Objective:   Physical Exam BP (!) 155/88   Pulse 88   Ht 5\' 6"  (1.676 m)   Wt 188 lb (85.3 kg)   BMI 30.34 kg/m  Constitutional: Well-developed and well-nourished. No distress. HEENT: Normocephalic and atraumatic. Oropharynx is clear and moist. No oropharyngeal exudate. Conjunctivae are normal.  No scleral icterus. Neck: Neck supple. Trachea midline. Cardiovascular: Normal rate, regular rhythm and intact distal pulses. Pulmonary/chest: Effort normal and breath sounds normal. No wheezing, rales or rhonchi. Abdominal: Soft, nontender, nondistended. Bowel sounds active throughout.  Rectal: normal external exam, no perianal tenderness or fluctuance, rectal exam with normal tone, no masses, soft stool in the rectal vault.  Anoscopy: Performed with chaperone present revealed moderate internal hemorrhoids which were nonbleeding, no masses and again soft but formed light brown stool in the vault which was heme-negative today  Extremities: no clubbing, cyanosis, or edema Neurological: Alert and oriented to person place and time. Skin: Skin is warm and dry. No rashes noted. Psychiatric: Normal mood and affect. Behavior is normal.     Assessment & Plan:  82 year old male with a past medical history of GERD, esophageal dysmotility, colon polyps, significant colonic diverticulosis, constipation who is here for follow-up.  1. Rectal bleeding/internal hemorrhoids -- Most likely source for rectal bleeding, painless in nature, is internal hemorrhoids. Likely exacerbated by Xarelto.  Stools are soft and constipation seems well managed with MiraLAX and Metamucil. He will continue both. We discussed flexible sigmoidoscopy versus medical management. He prefers medical management first which is reasonable. Will try Hajek resents suppository 25 mg twice a day 5-7 days. If cost prohibitive try cortisone suppository  1% twice a day 5-7 days. He is asked to notify me if bleeding continues and if so  would consider flexible sigmoidoscopy.  2. Constipation -- as above continue twice a day MiraLAX and Metamucil  25 minutes spent with the patient today. Greater than 50% was spent in counseling and coordination of care with the patient

## 2016-10-17 NOTE — Patient Instructions (Signed)
We have sent the following medications to your pharmacy for you to pick up at your convenience: Anusol suppositories- 1 suppository per rectum twice daily x 5-7 days  If the anusol suppositories are not covered by insurance, you may purchase hydrocortisone 1% suppositories over the counter and use twice daily x 5-7 days.  Continue metamucil.  Continue Miralax.  Call our office if the bleeding does not get better.  If you are age 80 or older, your body mass index should be between 23-30. Your Body mass index is 30.34 kg/m. If this is out of the aforementioned range listed, please consider follow up with your Primary Care Provider.  If you are age 37 or younger, your body mass index should be between 19-25. Your Body mass index is 30.34 kg/m. If this is out of the aformentioned range listed, please consider follow up with your Primary Care Provider.

## 2016-10-29 ENCOUNTER — Ambulatory Visit (INDEPENDENT_AMBULATORY_CARE_PROVIDER_SITE_OTHER): Payer: Medicare Other | Admitting: *Deleted

## 2016-10-29 ENCOUNTER — Telehealth: Payer: Self-pay | Admitting: Cardiology

## 2016-10-29 DIAGNOSIS — I495 Sick sinus syndrome: Secondary | ICD-10-CM

## 2016-10-29 NOTE — Telephone Encounter (Signed)
LMOVM reminding pt to send remote transmission.   

## 2016-10-30 NOTE — Progress Notes (Signed)
Remote pacemaker transmission.   

## 2016-11-05 ENCOUNTER — Encounter: Payer: Self-pay | Admitting: Cardiology

## 2016-11-13 ENCOUNTER — Telehealth: Payer: Self-pay | Admitting: Internal Medicine

## 2016-11-13 NOTE — Telephone Encounter (Signed)
Patient daughter inlaw states that medication previously prescribed by Dr.Pyrtle was too expensive so patient tried preparationH. Pt is still bleeding and can now afford medication so pt would like to know if Dr.Pyrtle would like him to take prepH again or the medication. Pt also on Bt but states can get approval from doctor to hold if need be

## 2016-11-13 NOTE — Telephone Encounter (Signed)
Left message to call back  

## 2016-11-14 LAB — CUP PACEART REMOTE DEVICE CHECK
Battery Voltage: 2.79 V
Brady Statistic AP VS Percent: 0 %
Implantable Lead Implant Date: 20061128
Implantable Lead Location: 753860
Lead Channel Pacing Threshold Amplitude: 0.625 V
Lead Channel Pacing Threshold Pulse Width: 0.4 ms
Lead Channel Pacing Threshold Pulse Width: 0.4 ms
Lead Channel Setting Pacing Amplitude: 2 V
Lead Channel Setting Pacing Pulse Width: 0.4 ms
Lead Channel Setting Sensing Sensitivity: 4 mV
MDC IDC LEAD IMPLANT DT: 20061128
MDC IDC LEAD LOCATION: 753859
MDC IDC LEAD MODEL: 4592
MDC IDC MSMT BATTERY IMPEDANCE: 208 Ohm
MDC IDC MSMT BATTERY REMAINING LONGEVITY: 101 mo
MDC IDC MSMT LEADCHNL RA IMPEDANCE VALUE: 514 Ohm
MDC IDC MSMT LEADCHNL RA PACING THRESHOLD AMPLITUDE: 0.5 V
MDC IDC MSMT LEADCHNL RV IMPEDANCE VALUE: 609 Ohm
MDC IDC PG IMPLANT DT: 20150428
MDC IDC SESS DTM: 20171213200136
MDC IDC SET LEADCHNL RV PACING AMPLITUDE: 2.5 V
MDC IDC STAT BRADY AP VP PERCENT: 97 %
MDC IDC STAT BRADY AS VP PERCENT: 3 %
MDC IDC STAT BRADY AS VS PERCENT: 0 %

## 2016-11-19 ENCOUNTER — Encounter: Payer: Self-pay | Admitting: Family Medicine

## 2016-11-19 ENCOUNTER — Ambulatory Visit (INDEPENDENT_AMBULATORY_CARE_PROVIDER_SITE_OTHER): Payer: Medicare Other | Admitting: Family Medicine

## 2016-11-19 ENCOUNTER — Other Ambulatory Visit: Payer: Self-pay | Admitting: Family Medicine

## 2016-11-19 VITALS — BP 122/64 | HR 76 | Temp 98.6°F | Ht 66.0 in | Wt 188.4 lb

## 2016-11-19 DIAGNOSIS — Z8719 Personal history of other diseases of the digestive system: Secondary | ICD-10-CM

## 2016-11-19 DIAGNOSIS — N183 Chronic kidney disease, stage 3 unspecified: Secondary | ICD-10-CM

## 2016-11-19 DIAGNOSIS — K625 Hemorrhage of anus and rectum: Secondary | ICD-10-CM

## 2016-11-19 DIAGNOSIS — I10 Essential (primary) hypertension: Secondary | ICD-10-CM | POA: Diagnosis not present

## 2016-11-19 DIAGNOSIS — R300 Dysuria: Secondary | ICD-10-CM

## 2016-11-19 DIAGNOSIS — Z7901 Long term (current) use of anticoagulants: Secondary | ICD-10-CM

## 2016-11-19 DIAGNOSIS — E118 Type 2 diabetes mellitus with unspecified complications: Secondary | ICD-10-CM

## 2016-11-19 LAB — POCT URINALYSIS DIPSTICK
Bilirubin, UA: NEGATIVE
Glucose, UA: NEGATIVE
Ketones, UA: NEGATIVE
Leukocytes, UA: NEGATIVE
Nitrite, UA: NEGATIVE
Protein, UA: NEGATIVE
Spec Grav, UA: 1.02
Urobilinogen, UA: 2
pH, UA: 6.5

## 2016-11-19 LAB — POCT UA - MICROSCOPIC ONLY

## 2016-11-19 LAB — POCT GLYCOSYLATED HEMOGLOBIN (HGB A1C): Hemoglobin A1C: 6.1

## 2016-11-19 MED ORDER — CEPHALEXIN 500 MG PO CAPS: ORAL_CAPSULE | ORAL | 0 refills | Status: DC

## 2016-11-19 MED ORDER — PANTOPRAZOLE SODIUM 40 MG PO TBEC: 40.0000 mg | DELAYED_RELEASE_TABLET | Freq: Every day | ORAL | 3 refills | Status: AC

## 2016-11-19 MED ORDER — HYDROCORTISONE ACE-PRAMOXINE 1-1 % RE FOAM: 1.0000 | Freq: Two times a day (BID) | RECTAL | 3 refills | Status: DC

## 2016-11-19 NOTE — Patient Instructions (Addendum)
For your bowel movements, I would add a HALF BOTTLE of over the counter magnesium citrate once or twiice a week instead of the milk of Magnesia. CONTINUE the metamucil and miralax. If you go a couple of days WITHOUT a bowel movement of some kind, we need to see you or give [place] a call.  I will send you a note about your blood work.Your A1C is PERFECT I am giving youa rx for some antibiotics for your urine--let me know if this does not improve

## 2016-11-20 NOTE — Assessment & Plan Note (Signed)
He reports small caliber stools. He has had 3 small bowel obstruction thumb last couple of years, all 3 requiring hospitalization. He also has a history of colon polyps. His daughter-in-law has been giving him some milk of magnesia once a week in addition to his Citrucel and MiraLAX. I think it would be better if he drank half a bottle of magnesium citrate then use of milk of magnesia and we have discussed that. Also discussed the importance of him notifying us if he has cessation of multiple daily bowel movements or some significant change.

## 2016-11-20 NOTE — Progress Notes (Signed)
    CHIEF COMPLAINT / HPI:   #1. Pain with urination to 3 days over the last week. Has noted no blood. He continues to have his baseline BPH symptoms, these are unchanged. He has noted increasing constipation lately and feels this is making his BPH symptoms a little bit worse however. #2. Constipation such that he has small bowel movements with small caliber stool, 2 or 3 times a day. Never total he feels like he is interested his colon. He also had some rectal bleeding in the last month and saw his gastroenterologist who recommended Anusol HC suppositories. They're quite expensive however he needed not fill the prescription. He has questions about whether or not there is some alternative. He is not bleeding on a daily basis. #3. His chronic issues seem doing pretty stable. He's had no change in exercise tolerance. He is using his walker more regularly however. He's not had any episodes of chest pain or acute shortness of breath. Occasionally he will have dizziness when he stands up but this is long-standing and unchanged.  REVIEW OF SYSTEMS:  See history of present illness  OBJECTIVE: He is here with his daughter-in-law Vital signs are reviewed.  GENERAL: Well-developed, well-nourished, no acute distress. CARDIOVASCULAR: Irregularly irregular. No murmur. LUNGS: Clear to auscultation bilaterally, no rales or wheeze. ABDOMEN: Soft positive bowel sounds. No rebound or guarding NEURO: No gross focal neurological deficits except for decreased hearing. He can stand with minimal use of his walker as an assistive device. He does not requiring one else to help him stand up. He walks with a shambling gait using the walker in a stooped posture and is a little bit unsteady at times. MSK: Movement of extremity x 4. LIKE: Alert and oriented 4. Interactive. Normal speech fluency in content. He acid and answers questions appropriately. There is no sign of agitation or psychomotor  retardation.    ASSESSMENT / PLAN: Please see problem oriented charting for details of chronic issues #1. Bright red blood per rectum: He is 32 seen his gastroenterologist. I will give him a prescription for Proctofoam hoping that is less expensive than the Anusol HC suppositories. #2. Dysuria: We'll get urine and urine culture today go ahead and start him on Keflex. #3. He continues to retain he check his blood sugars even though they're rarely above 120. He is not a diabetic medications. He has been checking blood sugars for so long now I don't think he would be comfortable not checking them so we will continue when necessary checks.

## 2016-11-20 NOTE — Assessment & Plan Note (Signed)
Continue rivaroxaban.

## 2016-12-12 ENCOUNTER — Emergency Department (HOSPITAL_COMMUNITY)
Admission: EM | Admit: 2016-12-12 | Discharge: 2016-12-12 | Disposition: A | Discharge status: Home / Self Care | Payer: Medicare Other | Attending: Emergency Medicine | Admitting: Emergency Medicine

## 2016-12-12 ENCOUNTER — Encounter (HOSPITAL_COMMUNITY): Payer: Self-pay

## 2016-12-12 DIAGNOSIS — Z7901 Long term (current) use of anticoagulants: Secondary | ICD-10-CM | POA: Insufficient documentation

## 2016-12-12 DIAGNOSIS — I129 Hypertensive chronic kidney disease with stage 1 through stage 4 chronic kidney disease, or unspecified chronic kidney disease: Secondary | ICD-10-CM | POA: Insufficient documentation

## 2016-12-12 DIAGNOSIS — Z85828 Personal history of other malignant neoplasm of skin: Secondary | ICD-10-CM | POA: Diagnosis not present

## 2016-12-12 DIAGNOSIS — Z96643 Presence of artificial hip joint, bilateral: Secondary | ICD-10-CM | POA: Insufficient documentation

## 2016-12-12 DIAGNOSIS — N183 Chronic kidney disease, stage 3 (moderate): Secondary | ICD-10-CM | POA: Diagnosis not present

## 2016-12-12 DIAGNOSIS — E1122 Type 2 diabetes mellitus with diabetic chronic kidney disease: Secondary | ICD-10-CM | POA: Diagnosis not present

## 2016-12-12 DIAGNOSIS — R310 Gross hematuria: Secondary | ICD-10-CM

## 2016-12-12 DIAGNOSIS — Z87891 Personal history of nicotine dependence: Secondary | ICD-10-CM | POA: Insufficient documentation

## 2016-12-12 DIAGNOSIS — Z950 Presence of cardiac pacemaker: Secondary | ICD-10-CM | POA: Insufficient documentation

## 2016-12-12 DIAGNOSIS — N390 Urinary tract infection, site not specified: Secondary | ICD-10-CM | POA: Insufficient documentation

## 2016-12-12 DIAGNOSIS — R319 Hematuria, unspecified: Secondary | ICD-10-CM | POA: Diagnosis present

## 2016-12-12 LAB — CBC
HEMATOCRIT: 37.1 % — AB (ref 39.0–52.0)
Hemoglobin: 12.3 g/dL — ABNORMAL LOW (ref 13.0–17.0)
MCH: 30 pg (ref 26.0–34.0)
MCHC: 33.2 g/dL (ref 30.0–36.0)
MCV: 90.5 fL (ref 78.0–100.0)
PLATELETS: 170 10*3/uL (ref 150–400)
RBC: 4.1 MIL/uL — AB (ref 4.22–5.81)
RDW: 14.5 % (ref 11.5–15.5)
WBC: 7.4 10*3/uL (ref 4.0–10.5)

## 2016-12-12 LAB — BASIC METABOLIC PANEL
Anion gap: 12 (ref 5–15)
BUN: 21 mg/dL — AB (ref 6–20)
CHLORIDE: 94 mmol/L — AB (ref 101–111)
CO2: 23 mmol/L (ref 22–32)
Calcium: 8.9 mg/dL (ref 8.9–10.3)
Creatinine, Ser: 1.27 mg/dL — ABNORMAL HIGH (ref 0.61–1.24)
GFR calc Af Amer: 54 mL/min — ABNORMAL LOW (ref >60)
GFR, EST NON AFRICAN AMERICAN: 47 mL/min — AB (ref >60)
GLUCOSE: 126 mg/dL — AB (ref 65–99)
POTASSIUM: 4.5 mmol/L (ref 3.5–5.1)
Sodium: 129 mmol/L — ABNORMAL LOW (ref 135–145)

## 2016-12-12 LAB — URINALYSIS, ROUTINE W REFLEX MICROSCOPIC: Squamous Epithelial / LPF: NONE SEEN

## 2016-12-12 MED ORDER — CEPHALEXIN 250 MG PO CAPS
500.0000 mg | ORAL_CAPSULE | Freq: Once | ORAL | Status: AC
  Administered 2016-12-12: 500 mg via ORAL
  Filled 2016-12-12: qty 2

## 2016-12-12 MED ORDER — CEPHALEXIN 500 MG PO CAPS: 500.0000 mg | ORAL_CAPSULE | Freq: Two times a day (BID) | ORAL | 0 refills | Status: DC

## 2016-12-12 NOTE — ED Triage Notes (Signed)
Pt states that tonight he started having hematuria with some dysuria and hesitancy and mild back pain.

## 2016-12-12 NOTE — ED Provider Notes (Signed)
[place] DEPT Provider Note   CSN: [id number] Arrival date & time: 12/12/16  0129     History   Chief Complaint Chief Complaint  Patient presents with  . Hematuria    HPI [name] is a 83 y.o. male.  HPI  83 year old presents with hematuria. Started last night around 11 pm. Has been having dysuria as well. No abdominal pain. Intermittent midline low back pain that is chronic and not present currently. No flank pain. No nausea, vomiting, or fever. He feels like he is urinating a little bit less. Is having to go more often. He is on xarelto for atrial fibrillation.  Past Medical History:  Diagnosis Date  . Acute gastritis without mention of hemorrhage   . Arthritis   . Atrial fibrillation ([place])   . Chronic kidney disease, stage III (moderate)   . Diverticulosis   . Diverticulosis of colon (without mention of hemorrhage)   . Environmental allergies    HISTORY OF  . Esophageal dysmotility   . Family history of malignant neoplasm of gastrointestinal tract   . GERD (gastroesophageal reflux disease)   . Gout   . Hiatal hernia   . History of skin cancer   . Hypertension   . Hypertrophy of prostate with urinary obstruction and other lower urinary tract symptoms (LUTS)   . Overactive bladder   . Personal history of colonic polyps 10/21/2006   hyperplastic   . Presence of permanent cardiac pacemaker 10/14/05   medtronic Adapta  . Second degree AV block 11/29/2014  . Sinus node dysfunction (H[facility])   . Small bowel obstruction   . Stricture and stenosis of esophagus   . Type II or unspecified type diabetes mellitus without mention of complication, not stated as uncontrolled    pt states that he checks his blood sugar once weekly, is not on oral meds or insulin  . Unspecified cataract   . Vitamin B12 deficiency     Patient Active Problem List   Diagnosis Date Noted  . Essential hypertension   . Second degree AV block 11/29/2014  . Abnormal esophagram 09/07/2014  .  Pacemaker - Medtronic Adapta 2006, gen change 2015 07/23/2013  . Chronic right hip pain 09/22/2012  . Testicular swelling, right 09/22/2012  . Presbycusis 05/14/2012  . Anticoagulant long-term use 01/30/2012  . Paroxysmal atrial fibrillation ([place]) 01/30/2012  . Hx SBO 01/30/2012  . S/P appendectomy 01/30/2012  . CHRONIC KIDNEY DISEASE STAGE III (MODERATE) 12/03/2010  . ALLERGIC RHINITIS 09/05/2009  . HOARSENESS 05/15/2009  . HIATAL HERNIA 12/14/2008  . Diverticulosis of large intestine 12/14/2008  . COLONIC POLYPS, HYPERPLASTIC, HX OF 12/14/2008  . ESOPHAGEAL STRICTURE 10/04/2008  . Other vitamin B12 deficiency anemia 01/26/2008  . GLAUCOMA 01/14/2007  . CATARACT 01/14/2007  . SICK SINUS SYNDROME 01/14/2007  . GASTROESOPHAGEAL REFLUX, NO ESOPHAGITIS 01/14/2007    Past Surgical History:  Procedure Laterality Date  . APPENDECTOMY    . CARDIOVERSION  10/01/2011   Procedure: CARDIOVERSION;  Surgeon: Dani Gobble [name];  Location: [facility] OR;  Service: Cardiovascular;  Laterality: N/A;  OP CARDIOVERSION  . CARDIOVERSION  11/02/12   successful  . ESOPHAGOGASTRODUODENOSCOPY (EGD) WITH PROPOFOL N/A 09/22/2014   Procedure: ESOPHAGOGASTRODUODENOSCOPY (EGD) WITH PROPOFOL;  Surgeon: Jerene Bears, MD;  Location: [facility];  Service: Gastroenterology;  Laterality: N/A;  . EYE SURGERY Bilateral years ago   cataract lens replacment  . PACEMAKER INSERTION  replaced 2 months ago, total of 8 years with pacemaker  . PERMANENT PACEMAKER GENERATOR CHANGE  N/A 03/14/2014   Procedure: PERMANENT PACEMAKER GENERATOR CHANGE;  Surgeon: Sanda Klein, MD; Medtronic Adapta L model number [place], serial number [id number]   . SAVORY DILATION N/A 09/22/2014   Procedure: SAVORY DILATION;  Surgeon: Jerene Bears, MD;  Location: [facility];  Service: Gastroenterology;  Laterality: N/A;  . TONSILLECTOMY    . TOTAL HIP ARTHROPLASTY Bilateral        Home Medications    Prior to Admission medications   Medication  Sig Start Date End Date Taking? Authorizing Provider  beta carotene w/minerals (OCUVITE) tablet Take 1 tablet by mouth every morning.    Yes Historical Provider, MD  Cyanocobalamin (VITAMIN B 12 PO) Take 1 capsule by mouth daily.   Yes Historical Provider, MD  dronedarone (MULTAQ) 400 MG tablet Take 1 tablet (400 mg total) by mouth 2 (two) times daily with a meal. 08/20/16  Yes Mihai [name], MD  hydrocortisone-pramoxine (PROCTOFOAM-HC) rectal foam Place 1 applicator rectally 2 (two) times daily. 11/19/16  Yes Dickie La, MD  nebivolol (BYSTOLIC) 10 MG tablet Take 1 tablet (10 mg total) by mouth every morning. 03/06/16  Yes Dickie La, MD  pantoprazole (PROTONIX) 40 MG tablet Take 1 tablet (40 mg total) by mouth daily before breakfast. 11/19/16  Yes Dickie La, MD  Polyethyl Glycol-Propyl Glycol (SYSTANE FREE OP) Place 1 drop into both eyes daily as needed. For dry eyes   Yes Historical Provider, MD  polyethylene glycol (MIRALAX / GLYCOLAX) packet Take 17 g by mouth daily. 08/30/15  Yes Veatrice Bourbon, MD  Probiotic Product ([place] PO) Take 1 tablet by mouth every morning.    Yes Historical Provider, MD  psyllium (HYDROCIL/METAMUCIL) 95 % PACK Take 1 packet by mouth daily. 08/30/15  Yes Alyssa A Haney, MD  ramipril (ALTACE) 2.5 MG capsule Take 1 capsule (2.5 mg total) by mouth every morning. 03/06/16  Yes Dickie La, MD  Rivaroxaban (XARELTO) 15 MG TABS tablet Take 1 tablet (15 mg total) by mouth at bedtime. 04/08/16  Yes Dickie La, MD  silodosin (RAPAFLO) 4 MG CAPS capsule Alternate taking 1 capsule daily with taking 2 daily Patient taking differently: Take 4-8 mg by mouth See admin instructions. Alternate taking 1 capsule daily with taking 2 daily 01/16/16  Yes Dickie La, MD  torsemide [facility]) 10 MG tablet One tablet twice a week, Mondays and Fridays. Patient taking differently: Take 10 mg by mouth 2 (two) times a week. Mondays and Fridays. 03/12/16  Yes Dickie La, MD  traMADol  (ULTRAM) 50 MG tablet Take by mouth 1-2 every 8-12 hours Patient taking differently: Take 50-100 mg by mouth every 12 (twelve) hours as needed for moderate pain.  09/22/16  Yes Dickie La, MD  Blood Glucose Monitoring Suppl (ONE TOUCH ULTRA SYSTEM KIT) w/Device KIT 1 kit by Does not apply route once. 12/12/15   Dickie La, MD  cephALEXin (KEFLEX) 500 MG capsule Take 1 capsule (500 mg total) by mouth 2 (two) times daily. 12/12/16   Sherwood Gambler, MD  glucose blood test strip Use as instructed 12/12/15   Dickie La, MD  hydrocortisone (ANUSOL-HC) 25 MG suppository Place 1 suppository (25 mg total) rectally 2 (two) times daily. X 5-7 days Patient not taking: Reported on 12/12/2016 10/17/16   Jerene Bears, MD  Lancets MISC Use with glucometer as directed 12/12/15   Dickie La, MD    Family History Family History  Problem Relation Age of Onset  .  Parkinsonism Mother   . Hypertension Father   . Colon cancer Brother 4  . Hypertension Brother   . Hodgkin's lymphoma Grandchild     Social History Social History  Substance Use Topics  . Smoking status: Former Smoker    Packs/day: 1.00    Years: 20.00    Types: Cigarettes    Quit date: 11/17/1969  . Smokeless tobacco: Never Used  . Alcohol use No     Comment: none since 1990     Allergies   Codeine   Review of Systems Review of Systems  Constitutional: Negative for fever.  Gastrointestinal: Negative for abdominal pain, nausea and vomiting.  Genitourinary: Positive for dysuria and hematuria. Negative for flank pain, penile pain and testicular pain.  Musculoskeletal: Positive for back pain.  All other systems reviewed and are negative.    Physical Exam Updated Vital Signs BP 146/65   Pulse 72   Temp 97.5 F (36.4 C)   Resp 12   Ht 5' 6" (1.676 m)   Wt 188 lb (85.3 kg)   SpO2 99%   BMI 30.34 kg/m   Physical Exam  Constitutional: He is oriented to person, place, and time. He appears well-developed and well-nourished. No  distress.  HENT:  Head: Normocephalic and atraumatic.  Right Ear: External ear normal.  Left Ear: External ear normal.  Nose: Nose normal.  Eyes: Right eye exhibits no discharge. Left eye exhibits no discharge.  Neck: Neck supple.  Cardiovascular: Normal rate, regular rhythm and normal heart sounds.   Pulmonary/Chest: Effort normal and breath sounds normal.  Abdominal: Soft. He exhibits no distension. There is no tenderness. There is no CVA tenderness.  Genitourinary: Penis normal. Uncircumcised. No penile erythema or penile tenderness. No discharge found.  Musculoskeletal: He exhibits no edema.  Neurological: He is alert and oriented to person, place, and time.  Skin: Skin is warm and dry. He is not diaphoretic.  Nursing note and vitals reviewed.    ED Treatments / Results  Labs (all labs ordered are listed, but only abnormal results are displayed) Labs Reviewed  URINALYSIS, ROUTINE W REFLEX MICROSCOPIC - Abnormal; Notable for the following:       Result Value   Color, Urine RED (*)    APPearance TURBID (*)    Glucose, UA   (*)    Value: TEST NOT REPORTED DUE TO COLOR INTERFERENCE OF URINE PIGMENT   Hgb urine dipstick   (*)    Value: TEST NOT REPORTED DUE TO COLOR INTERFERENCE OF URINE PIGMENT   Bilirubin Urine   (*)    Value: TEST NOT REPORTED DUE TO COLOR INTERFERENCE OF URINE PIGMENT   Ketones, ur   (*)    Value: TEST NOT REPORTED DUE TO COLOR INTERFERENCE OF URINE PIGMENT   Protein, ur   (*)    Value: TEST NOT REPORTED DUE TO COLOR INTERFERENCE OF URINE PIGMENT   Nitrite   (*)    Value: TEST NOT REPORTED DUE TO COLOR INTERFERENCE OF URINE PIGMENT   Leukocytes, UA   (*)    Value: TEST NOT REPORTED DUE TO COLOR INTERFERENCE OF URINE PIGMENT   Bacteria, UA MANY (*)    All other components within normal limits  CBC - Abnormal; Notable for the following:    RBC 4.10 (*)    Hemoglobin 12.3 (*)    HCT 37.1 (*)    All other components within normal limits  BASIC METABOLIC  PANEL - Abnormal; Notable for the following:  Sodium 129 (*)    Chloride 94 (*)    Glucose, Bld 126 (*)    BUN 21 (*)    Creatinine, Ser 1.27 (*)    GFR calc non Af Amer 47 (*)    GFR calc Af Amer 54 (*)    All other components within normal limits  URINE CULTURE    EKG  EKG Interpretation None       Radiology No results found.  Procedures Procedures (including critical care time)  Medications Ordered in ED Medications  cephALEXin (KEFLEX) capsule 500 mg (500 mg Oral Given 12/12/16 0705)     Initial Impression / Assessment and Plan / ED Course  I have reviewed the triage vital signs and the nursing notes.  Pertinent labs & imaging results that were available during my care of the patient were reviewed by me and considered in my medical decision making (see chart for details).     Patient presents with a lower UTI. Voided about 175 mL with around 200 left in bladder. Given he is urinating with only 200 left, I don't think foley is needed at this time. I believe this will resolve with antibiotic treatment. No signs/symptoms of more advanced disease. Treat with keflex, will give strict return precautions. Anemia is currently baseline.   Final Clinical Impressions(s) / ED Diagnoses   Final diagnoses:  Gross hematuria  Acute UTI    New Prescriptions New Prescriptions   CEPHALEXIN (KEFLEX) 500 MG CAPSULE    Take 1 capsule (500 mg total) by mouth 2 (two) times daily.     Sherwood Gambler, MD 12/12/16 [phone]

## 2016-12-13 LAB — URINE CULTURE: Culture: NO GROWTH

## 2016-12-31 ENCOUNTER — Encounter: Payer: Self-pay | Admitting: Family Medicine

## 2016-12-31 ENCOUNTER — Ambulatory Visit (INDEPENDENT_AMBULATORY_CARE_PROVIDER_SITE_OTHER): Payer: Medicare Other | Admitting: Family Medicine

## 2016-12-31 VITALS — BP 140/68 | HR 80 | Temp 97.5°F | Ht 66.0 in | Wt 193.8 lb

## 2016-12-31 DIAGNOSIS — J069 Acute upper respiratory infection, unspecified: Secondary | ICD-10-CM

## 2016-12-31 MED ORDER — AMOXICILLIN 500 MG PO CAPS: 500.0000 mg | ORAL_CAPSULE | Freq: Three times a day (TID) | ORAL | 0 refills | Status: DC

## 2016-12-31 MED ORDER — PREDNISONE 10 MG PO TABS: 10.0000 mg | ORAL_TABLET | Freq: Every day | ORAL | 1 refills | Status: DC

## 2016-12-31 NOTE — Patient Instructions (Addendum)
Coricidin D over the counter medicine is an OK decongestant for him to use'.  I have called in some antibiotics and a short course of prednisone If not getting better please give my office a call

## 2017-01-02 ENCOUNTER — Encounter: Payer: Self-pay | Admitting: Family Medicine

## 2017-01-02 NOTE — Progress Notes (Signed)
    CHIEF COMPLAINT / HPI:   2 days of increasing problems with sore throat and sinus pressure. He's not had fever. Moderate sinus drainage. Throat feels scratchy and raw. Sinus pressure is what's bothering him most. Pressure is most notable in the area under his bilateral eyes. He's not had any cough. No unusual shortness of breath.  REVIEW OF SYSTEMS:  See history of present illness  OBJECTIVE:  Vital signs are reviewed.   GEN.: Well-developed elderly male no acute distress HEENT: TMs bilaterally quite retracted and poorly mobile. Oropharynx he has some mild erythema but no exudate. Neck is without any lymphadenopathy. LUNGS: Clear to auscultation bilateral  ASSESSMENT / PLAN: Viral upper rest or infection. He seems to be having quite a bit of pain with sore throat signs mobile put him on any short burst of steroids given his age and other comorbidities. He'll call or return to clinic if any worsening or if he does not resolve his symptoms in the next 3-5 days.

## 2017-01-14 ENCOUNTER — Ambulatory Visit (INDEPENDENT_AMBULATORY_CARE_PROVIDER_SITE_OTHER): Payer: Medicare Other | Admitting: Cardiovascular Disease

## 2017-01-14 ENCOUNTER — Encounter: Payer: Self-pay | Admitting: Cardiovascular Disease

## 2017-01-14 VITALS — BP 130/78 | HR 82 | Ht 66.0 in | Wt 182.0 lb

## 2017-01-14 DIAGNOSIS — Z7901 Long term (current) use of anticoagulants: Secondary | ICD-10-CM | POA: Diagnosis not present

## 2017-01-14 DIAGNOSIS — D649 Anemia, unspecified: Secondary | ICD-10-CM

## 2017-01-14 DIAGNOSIS — I495 Sick sinus syndrome: Secondary | ICD-10-CM

## 2017-01-14 DIAGNOSIS — I441 Atrioventricular block, second degree: Secondary | ICD-10-CM | POA: Diagnosis not present

## 2017-01-14 DIAGNOSIS — E871 Hypo-osmolality and hyponatremia: Secondary | ICD-10-CM

## 2017-01-14 DIAGNOSIS — Z950 Presence of cardiac pacemaker: Secondary | ICD-10-CM

## 2017-01-14 DIAGNOSIS — I480 Paroxysmal atrial fibrillation: Secondary | ICD-10-CM

## 2017-01-14 LAB — CUP PACEART INCLINIC DEVICE CHECK
Battery Voltage: 2.79 V
Brady Statistic AP VP Percent: 97 %
Brady Statistic AS VP Percent: 3 %
Brady Statistic AS VS Percent: 0 %
Date Time Interrogation Session: 20180228141219
Implantable Lead Location: 753859
Implantable Lead Model: 4592
Lead Channel Impedance Value: 530 Ohm
Lead Channel Impedance Value: 640 Ohm
Lead Channel Pacing Threshold Amplitude: 0.5 V
Lead Channel Pacing Threshold Amplitude: 0.625 V
Lead Channel Pacing Threshold Pulse Width: 0.4 ms
Lead Channel Sensing Intrinsic Amplitude: 15.67 mV
Lead Channel Sensing Intrinsic Amplitude: 2.8 mV
Lead Channel Setting Pacing Amplitude: 2 V
Lead Channel Setting Pacing Amplitude: 2.5 V
MDC IDC LEAD IMPLANT DT: 20061128
MDC IDC LEAD IMPLANT DT: 20061128
MDC IDC LEAD LOCATION: 753860
MDC IDC MSMT BATTERY IMPEDANCE: 208 Ohm
MDC IDC MSMT BATTERY REMAINING LONGEVITY: 102 mo
MDC IDC MSMT LEADCHNL RA PACING THRESHOLD AMPLITUDE: 0.5 V
MDC IDC MSMT LEADCHNL RA PACING THRESHOLD PULSEWIDTH: 0.4 ms
MDC IDC MSMT LEADCHNL RA PACING THRESHOLD PULSEWIDTH: 0.4 ms
MDC IDC MSMT LEADCHNL RV PACING THRESHOLD AMPLITUDE: 0.75 V
MDC IDC MSMT LEADCHNL RV PACING THRESHOLD PULSEWIDTH: 0.4 ms
MDC IDC PG IMPLANT DT: 20150428
MDC IDC SET LEADCHNL RV PACING PULSEWIDTH: 0.4 ms
MDC IDC SET LEADCHNL RV SENSING SENSITIVITY: 4 mV
MDC IDC STAT BRADY AP VS PERCENT: 0 %

## 2017-01-14 LAB — BASIC METABOLIC PANEL
BUN: 19 mg/dL (ref 7–25)
CALCIUM: 9.1 mg/dL (ref 8.6–10.3)
CO2: 27 mmol/L (ref 20–31)
CREATININE: 1.15 mg/dL — AB (ref 0.70–1.11)
Chloride: 99 mmol/L (ref 98–110)
GLUCOSE: 104 mg/dL — AB (ref 65–99)
Potassium: 5.5 mmol/L — ABNORMAL HIGH (ref 3.5–5.3)
SODIUM: 133 mmol/L — AB (ref 135–146)

## 2017-01-14 LAB — HEMOGLOBIN: HEMOGLOBIN: 12.4 g/dL — AB (ref 13.2–17.1)

## 2017-01-14 MED ORDER — DRONEDARONE HCL 400 MG PO TABS: 400.0000 mg | ORAL_TABLET | Freq: Two times a day (BID) | ORAL | 3 refills | Status: DC

## 2017-01-14 MED ORDER — NEBIVOLOL HCL 10 MG PO TABS: 10.0000 mg | ORAL_TABLET | ORAL | 3 refills | Status: DC

## 2017-01-14 MED ORDER — RAMIPRIL 2.5 MG PO CAPS: 2.5000 mg | ORAL_CAPSULE | Freq: Every morning | ORAL | 3 refills | Status: DC

## 2017-01-14 NOTE — Progress Notes (Signed)
Patient ID: [name], male   DOB: October 30, 1920, 83 y.o.   MRN: [id number]     Cardiology Office Note    Date:  01/14/2017   ID:  [name], DOB Dec 10, 1919, MRN [id number]  PCP:  [name], MD  Cardiologist:   Sanda Klein, MD   chief complaint: pacemaker check.   History of Present Illness:  [name] is a 83 y.o. male with paroxysmal atrial fibrillation, sinus node dysfunction, high-grade second-degree AV block, s/p dual chamber pacemaker (2006, generator change 2015). He takes Multaq for AF prevention, is on Xarelto for embolic stroke prevention. He has well controlled HTN and mild, diet controlled type 2 DM.  He had small bowel obstruction in last August requiring a brief hospitalization. He was seen in the emergency room in late January 2018 with hematuria due to a urinary tract infection, now resolved. He has a moderate sized bruise on his left posterior medial thigh, no true hematoma. Eating complications and denies falls.  Interrogation of his pacemaker shows normal device function. He has 97% atrial pacing and 100% ventricular pacing. No atrial fibrillation has been seen. He had a 14 episode of nonsustained ventricular tachycardia at 154 bpm that occurred during his hospitalization in August, another similar episode in mid November at about 200 bpm. Both  lead parameters are stable, Heart rate histogram is favorable.  Past Medical History:  Diagnosis Date  . Acute gastritis without mention of hemorrhage   . Arthritis   . Atrial fibrillation ([place])   . Chronic kidney disease, stage III (moderate)   . Diverticulosis   . Diverticulosis of colon (without mention of hemorrhage)   . Environmental allergies    HISTORY OF  . Esophageal dysmotility   . Family history of malignant neoplasm of gastrointestinal tract   . GERD (gastroesophageal reflux disease)   . Gout   . Hiatal hernia   . History of skin cancer   . Hypertension   . Hypertrophy of prostate with urinary  obstruction and other lower urinary tract symptoms (LUTS)   . Overactive bladder   . Personal history of colonic polyps 10/21/2006   hyperplastic   . Presence of permanent cardiac pacemaker 10/14/05   medtronic Adapta  . Second degree AV block 11/29/2014  . Sinus node dysfunction (HCC)   . Small bowel obstruction   . Stricture and stenosis of esophagus   . Type II or unspecified type diabetes mellitus without mention of complication, not stated as uncontrolled    pt states that he checks his blood sugar once weekly, is not on oral meds or insulin  . Unspecified cataract   . Vitamin B12 deficiency     Past Surgical History:  Procedure Laterality Date  . APPENDECTOMY    . CARDIOVERSION  10/01/2011   Procedure: CARDIOVERSION;  Surgeon: Dani Gobble Manika Hast;  Location: [facility] OR;  Service: Cardiovascular;  Laterality: N/A;  OP CARDIOVERSION  . CARDIOVERSION  11/02/12   successful  . ESOPHAGOGASTRODUODENOSCOPY (EGD) WITH PROPOFOL N/A 09/22/2014   Procedure: ESOPHAGOGASTRODUODENOSCOPY (EGD) WITH PROPOFOL;  Surgeon: Jerene Bears, MD;  Location: [facility];  Service: Gastroenterology;  Laterality: N/A;  . EYE SURGERY Bilateral years ago   cataract lens replacment  . PACEMAKER INSERTION  replaced 2 months ago, total of 8 years with pacemaker  . PERMANENT PACEMAKER GENERATOR CHANGE N/A 03/14/2014   Procedure: PERMANENT PACEMAKER GENERATOR CHANGE;  Surgeon: Sanda Klein, MD; Medtronic Adapta L model number [place], serial number [id number]   . SAVORY DILATION  N/A 09/22/2014   Procedure: SAVORY DILATION;  Surgeon: Jerene Bears, MD;  Location: [facility];  Service: Gastroenterology;  Laterality: N/A;  . TONSILLECTOMY    . TOTAL HIP ARTHROPLASTY Bilateral     Outpatient Medications Prior to Visit  Medication Sig Dispense Refill  . beta carotene w/minerals (OCUVITE) tablet Take 1 tablet by mouth every morning.     . Blood Glucose Monitoring Suppl (ONE TOUCH ULTRA SYSTEM KIT) w/Device KIT 1 kit by Does  not apply route once. 1 each 0  . cephALEXin (KEFLEX) 500 MG capsule Take 1 capsule (500 mg total) by mouth 2 (two) times daily. 10 capsule 0  . Cyanocobalamin (VITAMIN B 12 PO) Take 1 capsule by mouth daily.    Marland Kitchen glucose blood test strip Use as instructed 100 each 12  . Lancets MISC Use with glucometer as directed 100 each 12  . pantoprazole (PROTONIX) 40 MG tablet Take 1 tablet (40 mg total) by mouth daily before breakfast. 90 tablet 3  . Polyethyl Glycol-Propyl Glycol (SYSTANE FREE OP) Place 1 drop into both eyes daily as needed. For dry eyes    . polyethylene glycol (MIRALAX / GLYCOLAX) packet Take 17 g by mouth daily. 14 each 0  . Probiotic Product (PHILLIPS COLON HEALTH PO) Take 1 tablet by mouth every morning.     . psyllium (HYDROCIL/METAMUCIL) 95 % PACK Take 1 packet by mouth daily. 56 each 2  . Rivaroxaban (XARELTO) 15 MG TABS tablet Take 1 tablet (15 mg total) by mouth at bedtime. 90 tablet 3  . silodosin (RAPAFLO) 4 MG CAPS capsule Alternate taking 1 capsule daily with taking 2 daily (Patient taking differently: Take 4-8 mg by mouth See admin instructions. Alternate taking 1 capsule daily with taking 2 daily) 135 capsule 3  . torsemide (DEMADEX) 10 MG tablet One tablet twice a week, Mondays and Fridays. (Patient taking differently: Take 10 mg by mouth 2 (two) times a week. Mondays and Fridays.) 28 tablet 3  . traMADol (ULTRAM) 50 MG tablet Take by mouth 1-2 every 8-12 hours (Patient taking differently: Take 50-100 mg by mouth every 12 (twelve) hours as needed for moderate pain. ) 90 tablet 3  . dronedarone (MULTAQ) 400 MG tablet Take 1 tablet (400 mg total) by mouth 2 (two) times daily with a meal. 180 tablet 1  . nebivolol (BYSTOLIC) 10 MG tablet Take 1 tablet (10 mg total) by mouth every morning. 90 tablet 3  . ramipril (ALTACE) 2.5 MG capsule Take 1 capsule (2.5 mg total) by mouth every morning. 90 capsule 3  . amoxicillin (AMOXIL) 500 MG capsule Take 1 capsule (500 mg total) by mouth  3 (three) times daily. (Patient not taking: Reported on 01/14/2017) 30 capsule 0  . hydrocortisone (ANUSOL-HC) 25 MG suppository Place 1 suppository (25 mg total) rectally 2 (two) times daily. X 5-7 days (Patient not taking: Reported on 12/12/2016) 14 suppository 0  . hydrocortisone-pramoxine (PROCTOFOAM-HC) rectal foam Place 1 applicator rectally 2 (two) times daily. (Patient not taking: Reported on 01/14/2017) 10 g 3  . predniSONE (DELTASONE) 10 MG tablet Take 1 tablet (10 mg total) by mouth daily with breakfast. (Patient not taking: Reported on 01/14/2017) 5 tablet 1   No facility-administered medications prior to visit.      Allergies:   Codeine   Social History   Social History  . Marital status: Widowed    Spouse name: N/A  . Number of children: N/A  . Years of education: N/A   Occupational  History  . retired Retired   Social History Main Topics  . Smoking status: Former Smoker    Packs/day: 1.00    Years: 20.00    Types: Cigarettes    Quit date: 11/17/1969  . Smokeless tobacco: Never Used  . Alcohol use No     Comment: none since 1990  . Drug use: No  . Sexual activity: Not Asked   Other Topics Concern  . None   Social History Narrative   He is going to [facility] in [place].     Family History:  The patient's family history includes Colon cancer (age of onset: 59) in his brother; Hodgkin's lymphoma in his grandchild; Hypertension in his brother and father; Parkinsonism in his mother.   ROS:   Please see the history of present illness.    ROS All other systems reviewed and are negative.   PHYSICAL EXAM:   VS:  BP 130/78   Pulse 82   Ht _0  (1.676 m)   Wt 82.6 kg (182 lb)   BMI 29.38 kg/m    GEN: Well nourished, well developed, in no acute distress  HEENT: normal  Neck: no JVD, carotid bruits, or masses Cardiac: RRR, possibly split S2; no murmurs, rubs, or gallops,no edema; paradoxically split second heart sound, healthy left subclavian  pacemaker site. Respiratory:  clear to auscultation bilaterally, normal work of breathing GI: soft, nontender, nondistended, + BS MS: no deformity or atrophy  Skin: warm and dry, no rash, medium size flat ecchymosis covers the posterior lower half of his left thigh Neuro:  Alert and Oriented x 3, Strength and sensation are intact Psych: euthymic mood, full affect  Wt Readings from Last 3 Encounters:  01/14/17 82.6 kg (182 lb)  12/31/16 87.9 kg (193 lb 12.8 oz)  12/12/16 85.3 kg (188 lb)      Studies/Labs Reviewed:   EKG:  EKG is not ordered today. Intracardiac electrogram today demonstrates AV sequential pacing Recent Labs: 06/24/2016: ALT 23 12/12/2016: BUN 21; Creatinine, Ser 1.27; Hemoglobin 12.3; Platelets 170; Potassium 4.5; Sodium 129   Lipid Panel    Component Value Date/Time   CHOL 152 07/05/2008 2038   TRIG 315 (H) 07/05/2008 2038   HDL 28 (L) 07/05/2008 2038   CHOLHDL 5.4 Ratio 07/05/2008 2038   VLDL 63 (H) 07/05/2008 2038   LDLCALC 61 07/05/2008 2038   LDLDIRECT 84 12/06/2014 1215     ASSESSMENT:    1. Paroxysmal atrial fibrillation (HCC)   2. Second degree AV block   3. SICK SINUS SYNDROME   4. Long term current use of anticoagulant   5. Pacemaker   6. Hyponatremia   7. Anemia, unspecified type      PLAN:  In order of problems listed above:  1. AFIb: Very rare episodes on Multaq. Required DCCV in 2013.  CHADSVasc score 4 (age 74, HTN1, DM1). He has had some minor bleeding complications, the risk/benefit ratio still seems to be in favor of anticoagulation. He can safely briefly interrupt anticoagulation if necessary or bleeding complications. 2. 2nd deg AVB: Cause of high prevalence of ventricular pacing, but not pacemaker dependent 3. SSS: Current sensor settings are appropriate or his rather sedentary lifestyle, no evidence to suggest chronotropic incompetence 4. Anticoagulation: Had a few episodes of minor bleeding including hematuria during a urinary  tract infection and the left thigh ecchymosis, but no seizures bleeding problems. Hemoglobin was borderline low at 12.3 in late January, actually an improvement compared with labs from  last summer when he had small bowel obstruction. Will recheck hemoglobin. 5. PPM: Normal pacemaker function, continue remote downloads every 3 months and office visit in 6 months. 6. Hyponatremia: No clear explanation. He only takes diuretics twice weekly. Was not particularly hyperglycemic. We'll recheck. 7. Anemia: recheck Hgb after new echymosis    Medication Adjustments/Labs and Tests Ordered: Current medicines are reviewed at length with the patient today.  Concerns regarding medicines are outlined above.  Medication changes, Labs and Tests ordered today are listed in the Patient Instructions below. Patient Instructions  Dr [name] recommends that you continue on your current medications as directed. Please refer to the Current Medication list given to you today.  Your physician recommends that you return for lab work TODAY.  Remote monitoring is used to monitor your Pacemaker of ICD from home. This monitoring reduces the number of office visits required to check your device to one time per year. It allows [place] to keep an eye on the functioning of your device to ensure it is working properly. You are scheduled for a device check from home on Wednesday, May 30th, 2018. You may send your transmission at any time that day. If you have a wireless device, the transmission will be sent automatically. After your physician reviews your transmission, you will receive a postcard with your next transmission date.  Dr [name] recommends that you schedule a follow-up appointment in 6 months with a pacemaker check. You will receive a reminder letter in the mail two months in advance. If you don't receive a letter, please call our office to schedule the follow-up appointment.  If you need a refill on your cardiac medications  before your next appointment, please call your pharmacy. Signed, Sanda Klein, MD  01/14/2017 9:25 AM    [facility] [address], [place], [place]  [address] Phone: [phone]; Fax: [phone]

## 2017-01-14 NOTE — Patient Instructions (Signed)
Dr [name] recommends that you continue on your current medications as directed. Please refer to the Current Medication list given to you today.  Your physician recommends that you return for lab work TODAY.  Remote monitoring is used to monitor your Pacemaker of ICD from home. This monitoring reduces the number of office visits required to check your device to one time per year. It allows [place] to keep an eye on the functioning of your device to ensure it is working properly. You are scheduled for a device check from home on Wednesday, May 30th, 2018. You may send your transmission at any time that day. If you have a wireless device, the transmission will be sent automatically. After your physician reviews your transmission, you will receive a postcard with your next transmission date.  Dr [name] recommends that you schedule a follow-up appointment in 6 months with a pacemaker check. You will receive a reminder letter in the mail two months in advance. If you don't receive a letter, please call our office to schedule the follow-up appointment.  If you need a refill on your cardiac medications before your next appointment, please call your pharmacy.

## 2017-01-15 ENCOUNTER — Telehealth: Payer: Self-pay

## 2017-01-15 DIAGNOSIS — E871 Hypo-osmolality and hyponatremia: Secondary | ICD-10-CM

## 2017-01-15 LAB — COMPREHENSIVE METABOLIC PANEL
ALBUMIN: 4.6 g/dL (ref 3.2–4.6)
ALT: 18 IU/L (ref 0–44)
AST: 15 IU/L (ref 0–40)
Albumin/Globulin Ratio: 1.8 (ref 1.2–2.2)
Alkaline Phosphatase: 72 IU/L (ref 39–117)
BUN / CREAT RATIO: 21 (ref 10–24)
BUN: 23 mg/dL (ref 10–36)
Bilirubin Total: 0.6 mg/dL (ref 0.0–1.2)
CALCIUM: 9.1 mg/dL (ref 8.6–10.2)
CO2: 25 mmol/L (ref 18–29)
Chloride: 91 mmol/L — ABNORMAL LOW (ref 96–106)
Creatinine, Ser: 1.12 mg/dL (ref 0.76–1.27)
GFR, EST AFRICAN AMERICAN: 65 mL/min/{1.73_m2} (ref >59)
GFR, EST NON AFRICAN AMERICAN: 56 mL/min/{1.73_m2} — AB (ref >59)
GLOBULIN, TOTAL: 2.5 g/dL (ref 1.5–4.5)
Glucose: 96 mg/dL (ref 65–99)
POTASSIUM: 4.8 mmol/L (ref 3.5–5.2)
SODIUM: 131 mmol/L — AB (ref 134–144)
TOTAL PROTEIN: 7.1 g/dL (ref 6.0–8.5)

## 2017-01-15 LAB — CBC
HEMOGLOBIN: 13 g/dL (ref 13.0–17.7)
Hematocrit: 40.2 % (ref 37.5–51.0)
MCH: 29.7 pg (ref 26.6–33.0)
MCHC: 32.3 g/dL (ref 31.5–35.7)
MCV: 92 fL (ref 79–97)
Platelets: 217 10*3/uL (ref 150–379)
RBC: 4.38 x10E6/uL (ref 4.14–5.80)
RDW: 14.8 % (ref 12.3–15.4)
WBC: 8.3 10*3/uL (ref 3.4–10.8)

## 2017-01-15 NOTE — Telephone Encounter (Signed)
-----   Message from Sanda Klein, MD sent at 01/14/2017  3:36 PM EST ----- Hemoglobin is not low. Cannot explain his sleepiness. Potassium is slightly high, but that makes little sense in a patient with normal renal function, on a loop diuretic and without potassium supplements. Please make sure he is not taking potassium supplements. Ramipril can elevate potassium levels but the dose that he takes is tiny. Suspect it's a lab error/hemolysis. Please recheck BMET in 7-10 days.

## 2017-01-15 NOTE — Telephone Encounter (Signed)
Spoke with [name], pt's daughter-in-law (okay per DPR). Results reviewed and [name] agreed with plan. She states patient eats 1 banana a day and eats as much fruit as he can. He is not currently taking K+ supplements.  Advised that labs would be ordered to be completed at any [facility] lab in 7-10 days. Verbalized understanding and agreed with plan. Repeat lab ordered.

## 2017-01-29 LAB — BASIC METABOLIC PANEL
BUN: 21 mg/dL (ref 7–25)
CALCIUM: 8.9 mg/dL (ref 8.6–10.3)
CO2: 24 mmol/L (ref 20–31)
Chloride: 99 mmol/L (ref 98–110)
Creat: 1.16 mg/dL — ABNORMAL HIGH (ref 0.70–1.11)
GLUCOSE: 117 mg/dL — AB (ref 65–99)
Potassium: 5.3 mmol/L (ref 3.5–5.3)
Sodium: 132 mmol/L — ABNORMAL LOW (ref 135–146)

## 2017-02-06 ENCOUNTER — Ambulatory Visit (INDEPENDENT_AMBULATORY_CARE_PROVIDER_SITE_OTHER): Payer: Medicare Other | Admitting: Obstetrics and Gynecology

## 2017-02-06 ENCOUNTER — Encounter: Payer: Self-pay | Admitting: Obstetrics and Gynecology

## 2017-02-06 VITALS — BP 138/74 | HR 62 | Temp 97.9°F | Ht 66.0 in | Wt 191.2 lb

## 2017-02-06 DIAGNOSIS — R300 Dysuria: Secondary | ICD-10-CM | POA: Diagnosis present

## 2017-02-06 LAB — POCT URINALYSIS DIP (MANUAL ENTRY)
Bilirubin, UA: NEGATIVE
Blood, UA: NEGATIVE
Glucose, UA: NEGATIVE
Ketones, POC UA: NEGATIVE
Leukocytes, UA: NEGATIVE
Nitrite, UA: NEGATIVE
Protein Ur, POC: NEGATIVE
Urobilinogen, UA: 0.2 (ref ?–2.0)
pH, UA: 5.5 (ref 5.0–8.0)

## 2017-02-06 MED ORDER — SILODOSIN 4 MG PO CAPS: 8.0000 mg | ORAL_CAPSULE | Freq: Every day | ORAL | 3 refills | Status: DC

## 2017-02-06 NOTE — Progress Notes (Signed)
   Subjective:   Patient ID: [name], male    DOB: 1920/02/11, 83 y.o.   MRN: [id number]  Patient presents for Same Day Appointment  Chief Complaint  Patient presents with  . Urinary Tract Infection    HPI: #DYSURIA Pain urinating started 2 days ago. Pain is: burning with urination Also having associated decreased output follows with urology for BPH.  Medications tried: none Has had one UTI in the last 12 months but h/o of recurrent UTIs  Symptoms Urgency: no Frequency: no Blood in urine: no Pain in back:no Fever: no Abdominal pain: no  Review of Systems   See HPI for ROS.   History  Smoking Status  . Former Smoker  . Packs/day: 1.00  . Years: 20.00  . Types: Cigarettes  . Quit date: 11/17/1969  Smokeless Tobacco  . Never Used    Past medical history, surgical, family, and social history reviewed and updated in the EMR as appropriate.  Pertinent Historical Findings include: Objective:  BP 138/74   Pulse 62   Temp 97.9 F (36.6 C) (Oral)   Ht 5\' 6"  (1.676 m)   Wt 191 lb 3.2 oz (86.7 kg)   SpO2 98%   BMI 30.86 kg/m  Vitals and nursing note reviewed  Physical Exam  Constitutional: He is well-developed, well-nourished, and in no distress.  Cardiovascular: Normal rate.   Pulmonary/Chest: Effort normal.  Abdominal: Soft. He exhibits distension. There is no tenderness. There is no guarding.    Results for orders placed or performed in visit on 02/06/17  POCT urinalysis dipstick  Result Value Ref Range   Color, UA yellow yellow   Clarity, UA clear clear   Glucose, UA negative negative   Bilirubin, UA negative negative   Ketones, POC UA negative negative   Spec Grav, UA  1.030 - 1.035   Blood, UA negative negative   pH, UA 5.5 5.0 - 8.0   Protein Ur, POC negative negative   Urobilinogen, UA 0.2 Negative - 2.0   Nitrite, UA Negative Negative   Leukocytes, UA Negative Negative    Assessment & Plan:  1. Dysuria UA and clinical findings  unremarkable. Do not think patient has UTI at this time. Unknown cause for patient's dysuria. Do believe he is continuing to have BPH symptoms with incomplete emptying. Patient to continue taking medications and follow instructions of urologist (of note patient states urologist increased his silodosin to 8mg ). Conservative measures at this time including hydration. - POCT urinalysis dipstick  Diagnosis and plan along were discussed in detail with this patient today. The patient verbalized understanding and agreed with the plan. Patient advised if symptoms worsen return to clinic or ER.   PATIENT EDUCATION PROVIDED: See AVS   Luiz Blare, DO 02/06/2017, 10:49 AM PGY-3, [place]

## 2017-02-06 NOTE — Patient Instructions (Signed)
No signs of UTI Please come back to clinic if symptoms persist or ED over the weekend  Drink plenty of fluids Try cranberry juice Take all medications

## 2017-03-14 ENCOUNTER — Inpatient Hospital Stay (HOSPITAL_COMMUNITY)
Admission: EM | Admit: 2017-03-14 | Discharge: 2017-03-16 | DRG: 389 | Disposition: A | Discharge status: Home Health | Payer: Medicare Other | Attending: Family Medicine | Admitting: Family Medicine

## 2017-03-14 ENCOUNTER — Emergency Department (HOSPITAL_COMMUNITY): Payer: Medicare Other

## 2017-03-14 ENCOUNTER — Encounter (HOSPITAL_COMMUNITY): Payer: Self-pay | Admitting: Radiology

## 2017-03-14 DIAGNOSIS — Z79899 Other long term (current) drug therapy: Secondary | ICD-10-CM

## 2017-03-14 DIAGNOSIS — N138 Other obstructive and reflux uropathy: Secondary | ICD-10-CM | POA: Diagnosis present

## 2017-03-14 DIAGNOSIS — K219 Gastro-esophageal reflux disease without esophagitis: Secondary | ICD-10-CM | POA: Diagnosis present

## 2017-03-14 DIAGNOSIS — Z86010 Personal history of colonic polyps: Secondary | ICD-10-CM

## 2017-03-14 DIAGNOSIS — I130 Hypertensive heart and chronic kidney disease with heart failure and stage 1 through stage 4 chronic kidney disease, or unspecified chronic kidney disease: Secondary | ICD-10-CM | POA: Diagnosis present

## 2017-03-14 DIAGNOSIS — E871 Hypo-osmolality and hyponatremia: Secondary | ICD-10-CM | POA: Diagnosis present

## 2017-03-14 DIAGNOSIS — K567 Ileus, unspecified: Secondary | ICD-10-CM | POA: Diagnosis present

## 2017-03-14 DIAGNOSIS — D649 Anemia, unspecified: Secondary | ICD-10-CM

## 2017-03-14 DIAGNOSIS — K56609 Unspecified intestinal obstruction, unspecified as to partial versus complete obstruction: Secondary | ICD-10-CM | POA: Diagnosis present

## 2017-03-14 DIAGNOSIS — Z9049 Acquired absence of other specified parts of digestive tract: Secondary | ICD-10-CM

## 2017-03-14 DIAGNOSIS — I480 Paroxysmal atrial fibrillation: Secondary | ICD-10-CM | POA: Diagnosis present

## 2017-03-14 DIAGNOSIS — E1122 Type 2 diabetes mellitus with diabetic chronic kidney disease: Secondary | ICD-10-CM | POA: Diagnosis present

## 2017-03-14 DIAGNOSIS — H9110 Presbycusis, unspecified ear: Secondary | ICD-10-CM | POA: Diagnosis present

## 2017-03-14 DIAGNOSIS — N179 Acute kidney failure, unspecified: Secondary | ICD-10-CM | POA: Diagnosis present

## 2017-03-14 DIAGNOSIS — Z96643 Presence of artificial hip joint, bilateral: Secondary | ICD-10-CM

## 2017-03-14 DIAGNOSIS — K5730 Diverticulosis of large intestine without perforation or abscess without bleeding: Secondary | ICD-10-CM | POA: Diagnosis present

## 2017-03-14 DIAGNOSIS — Z888 Allergy status to other drugs, medicaments and biological substances status: Secondary | ICD-10-CM

## 2017-03-14 DIAGNOSIS — Z87891 Personal history of nicotine dependence: Secondary | ICD-10-CM

## 2017-03-14 DIAGNOSIS — R638 Other symptoms and signs concerning food and fluid intake: Secondary | ICD-10-CM | POA: Diagnosis present

## 2017-03-14 DIAGNOSIS — Z66 Do not resuscitate: Secondary | ICD-10-CM | POA: Diagnosis present

## 2017-03-14 DIAGNOSIS — N183 Chronic kidney disease, stage 3 (moderate): Secondary | ICD-10-CM | POA: Diagnosis present

## 2017-03-14 DIAGNOSIS — I441 Atrioventricular block, second degree: Secondary | ICD-10-CM | POA: Diagnosis present

## 2017-03-14 DIAGNOSIS — E861 Hypovolemia: Secondary | ICD-10-CM | POA: Diagnosis present

## 2017-03-14 DIAGNOSIS — K56600 Partial intestinal obstruction, unspecified as to cause: Secondary | ICD-10-CM | POA: Diagnosis not present

## 2017-03-14 DIAGNOSIS — H409 Unspecified glaucoma: Secondary | ICD-10-CM | POA: Diagnosis present

## 2017-03-14 DIAGNOSIS — E538 Deficiency of other specified B group vitamins: Secondary | ICD-10-CM | POA: Diagnosis present

## 2017-03-14 DIAGNOSIS — E860 Dehydration: Secondary | ICD-10-CM | POA: Diagnosis present

## 2017-03-14 DIAGNOSIS — N401 Enlarged prostate with lower urinary tract symptoms: Secondary | ICD-10-CM | POA: Diagnosis present

## 2017-03-14 DIAGNOSIS — L8990 Pressure ulcer of unspecified site, unspecified stage: Secondary | ICD-10-CM | POA: Insufficient documentation

## 2017-03-14 DIAGNOSIS — Z7901 Long term (current) use of anticoagulants: Secondary | ICD-10-CM

## 2017-03-14 DIAGNOSIS — I5032 Chronic diastolic (congestive) heart failure: Secondary | ICD-10-CM | POA: Diagnosis present

## 2017-03-14 DIAGNOSIS — Z950 Presence of cardiac pacemaker: Secondary | ICD-10-CM

## 2017-03-14 LAB — COMPREHENSIVE METABOLIC PANEL
ALK PHOS: 61 U/L (ref 38–126)
ALT: 27 U/L (ref 17–63)
ANION GAP: 8 (ref 5–15)
AST: 21 U/L (ref 15–41)
Albumin: 4.2 g/dL (ref 3.5–5.0)
BUN: 22 mg/dL — ABNORMAL HIGH (ref 6–20)
CO2: 28 mmol/L (ref 22–32)
Calcium: 9.6 mg/dL (ref 8.9–10.3)
Chloride: 96 mmol/L — ABNORMAL LOW (ref 101–111)
Creatinine, Ser: 1.35 mg/dL — ABNORMAL HIGH (ref 0.61–1.24)
GFR calc Af Amer: 50 mL/min — ABNORMAL LOW (ref >60)
GFR calc non Af Amer: 43 mL/min — ABNORMAL LOW (ref >60)
GLUCOSE: 136 mg/dL — AB (ref 65–99)
Potassium: 4.8 mmol/L (ref 3.5–5.1)
SODIUM: 132 mmol/L — AB (ref 135–145)
Total Bilirubin: 1.1 mg/dL (ref 0.3–1.2)
Total Protein: 6.8 g/dL (ref 6.5–8.1)

## 2017-03-14 LAB — CBC
HCT: 37.8 % — ABNORMAL LOW (ref 39.0–52.0)
Hemoglobin: 12.6 g/dL — ABNORMAL LOW (ref 13.0–17.0)
MCH: 29.6 pg (ref 26.0–34.0)
MCHC: 33.3 g/dL (ref 30.0–36.0)
MCV: 88.9 fL (ref 78.0–100.0)
PLATELETS: 186 10*3/uL (ref 150–400)
RBC: 4.25 MIL/uL (ref 4.22–5.81)
RDW: 14.7 % (ref 11.5–15.5)
WBC: 8.1 10*3/uL (ref 4.0–10.5)

## 2017-03-14 LAB — POC OCCULT BLOOD, ED: Fecal Occult Bld: NEGATIVE

## 2017-03-14 LAB — URINALYSIS, ROUTINE W REFLEX MICROSCOPIC
Bilirubin Urine: NEGATIVE
GLUCOSE, UA: NEGATIVE mg/dL
Hgb urine dipstick: NEGATIVE
KETONES UR: NEGATIVE mg/dL
LEUKOCYTES UA: NEGATIVE
Nitrite: NEGATIVE
PH: 7 (ref 5.0–8.0)
Protein, ur: NEGATIVE mg/dL
Specific Gravity, Urine: 1.013 (ref 1.005–1.030)

## 2017-03-14 LAB — I-STAT TROPONIN, ED: Troponin i, poc: 0 ng/mL (ref 0.00–0.08)

## 2017-03-14 MED ORDER — POLYETHYLENE GLYCOL 3350 17 G PO PACK
17.0000 g | PACK | Freq: Two times a day (BID) | ORAL | Status: DC
  Administered 2017-03-14 – 2017-03-16 (×3): 17 g via ORAL
  Filled 2017-03-14 (×3): qty 1

## 2017-03-14 MED ORDER — RIVAROXABAN 15 MG PO TABS
15.0000 mg | ORAL_TABLET | Freq: Every day | ORAL | Status: DC
  Administered 2017-03-14 – 2017-03-15 (×2): 15 mg via ORAL
  Filled 2017-03-14 (×2): qty 1

## 2017-03-14 MED ORDER — DRONEDARONE HCL 400 MG PO TABS
400.0000 mg | ORAL_TABLET | Freq: Two times a day (BID) | ORAL | Status: DC
  Administered 2017-03-15 – 2017-03-16 (×2): 400 mg via ORAL
  Filled 2017-03-14 (×4): qty 1

## 2017-03-14 MED ORDER — TRAMADOL HCL 50 MG PO TABS: 50.0000 mg | ORAL_TABLET | Freq: Two times a day (BID) | ORAL | Status: DC | PRN

## 2017-03-14 MED ORDER — PANTOPRAZOLE SODIUM 40 MG PO TBEC
40.0000 mg | DELAYED_RELEASE_TABLET | Freq: Every day | ORAL | Status: DC
  Administered 2017-03-15 – 2017-03-16 (×2): 40 mg via ORAL
  Filled 2017-03-14 (×2): qty 1

## 2017-03-14 MED ORDER — PSYLLIUM 95 % PO PACK
1.0000 | PACK | Freq: Every day | ORAL | Status: DC
  Administered 2017-03-16: 1 via ORAL
  Filled 2017-03-14 (×2): qty 1

## 2017-03-14 MED ORDER — IOPAMIDOL (ISOVUE-300) INJECTION 61%
INTRAVENOUS | Status: AC
  Administered 2017-03-14: 100 mL
  Filled 2017-03-14: qty 100

## 2017-03-14 MED ORDER — RISAQUAD PO CAPS
1.0000 | ORAL_CAPSULE | Freq: Every day | ORAL | Status: DC
  Administered 2017-03-15 – 2017-03-16 (×2): 1 via ORAL
  Filled 2017-03-14 (×3): qty 1

## 2017-03-14 MED ORDER — TAMSULOSIN HCL 0.4 MG PO CAPS
0.4000 mg | ORAL_CAPSULE | Freq: Every day | ORAL | Status: DC
  Administered 2017-03-14 – 2017-03-15 (×2): 0.4 mg via ORAL
  Filled 2017-03-14 (×2): qty 1

## 2017-03-14 MED ORDER — ONDANSETRON 4 MG PO TBDP: 4.0000 mg | ORAL_TABLET | Freq: Three times a day (TID) | ORAL | Status: DC | PRN

## 2017-03-14 MED ORDER — RAMIPRIL 2.5 MG PO CAPS
2.5000 mg | ORAL_CAPSULE | Freq: Every day | ORAL | Status: DC
  Administered 2017-03-15 – 2017-03-16 (×2): 2.5 mg via ORAL
  Filled 2017-03-14 (×2): qty 1

## 2017-03-14 MED ORDER — NEBIVOLOL HCL 2.5 MG PO TABS
10.0000 mg | ORAL_TABLET | Freq: Every day | ORAL | Status: DC
  Administered 2017-03-15 – 2017-03-16 (×2): 10 mg via ORAL
  Filled 2017-03-14 (×2): qty 4

## 2017-03-14 MED ORDER — DEXTROSE-NACL 5-0.9 % IV SOLN
INTRAVENOUS | Status: DC
  Administered 2017-03-14 – 2017-03-16 (×4): via INTRAVENOUS

## 2017-03-14 NOTE — ED Notes (Signed)
Attempted to call report

## 2017-03-14 NOTE — ED Triage Notes (Signed)
Pt states he hasn't had a bowel movement for a few days, states he had one yesterday but it was very small, also states hes having trouble urinating. Pt states hes had to have his esophagus stretched 3 times, and hes having some epigastric pain, feels like he needs to cough something up. Denies CP, but c/o epigastric pain in region. EKG done in triage

## 2017-03-14 NOTE — ED Notes (Signed)
ED Provider at bedside. 

## 2017-03-14 NOTE — H&P (Signed)
[facility] Admission History and Physical Service Pager: [phone]  Patient name: [name] Medical record number: [id number] Date of birth: 29-Aug-1920 Age: 83 y.o. Gender: male  Primary Care Provider: [name], MD Consultants: none Code Status: DNR/DNI (per discussion on admission)  Chief Complaint: abdominal discomfort  Assessment and Plan: AVIEN TAHA is a 83 y.o. male presenting with abdominal discomfort 2/2 SBO. PMH is significant for previous SBO, HTN, T2DM, GERD, gout, h/o diverticulosis, CKD Stage III, Afib on xarelto, BPH.  Partial Small bowel obstruction. Seen on CT abd with proximal small bowel is also distended with a transition point seen in the mid abdomen just to the left of midline. Has had previous hospitalizations for this, last 06/2016, has been told not surgical candidate d/t age. Still having BM today but small, nausea but no emesis, does have abdominal distention. Of note, metallic debris seen on CT abd likely due to [employer] that patient has ingested.  - Place in observation, attending Dr. Erin Hearing - NPO except sips with meds for bowel rest - D5 NS@125cc /hr - home metamucil qd and miralax BID - consider NG tube placement for gastric decompression if worsens - zofran ODT 37m q8h prn for nausea  BPH: On rapaflo and has failed flomax in the past.  -Flomax 0.4 qd while here since rapaflo not on formulary - though as he has failed this in the past, may need to request non-formulary -Bladder scan for decreased UOP and may need foley catheter if retaining without home medication   Mild AKI on CKD:  Cr on admit 1.35. Recent baseline Cr appears to be ~1.1. Mild elevation in Cr likely secondary to dehydration from poor PO intake.  - MIVF - AM BMET   Hyponatremia, chronic: Very mild with Na 132 at admission. Likely related to hypovolemia but also chronic "tea and toast" diet.  -correct with NS  -AM BMET   Afib at  home on xarelto, multaq, nebivolol.  - continue home meds  HTN on ramipril 2.579mqd at home. BP on admit 133/60 - continue home meds - monitor BP  H/o T2DM diet controlled. last a1c 11/19/16 was 6.1 - monitor on BMET  H/o HFpEF and high grade 2nd degree AV block. Has dual chamber pacemaker and followed by cardiology. Last echo was 2006 with preserved EF and currently euvolemic. - Hold home torsemide (takes Mondays and Fridays) while receiving IVF - Continue home bystolic   FEN/GI: D5NS@125 , NPO except sips with meds Prophylaxis: on xarelto  Dis[facility]ness:  [name] a [age].o. male presenting with abdominal discomfort and decreased BMs. Daughter in law at bedside.  States over the last 3 days has had worsening abdominal discomfort, mild nausea, abdominal distention and small stools. Similar to previous episodes of SBO. Had very small BM just after midnight last night and today, no blood in stool although he does have a h/o hemorrhoids. No emesis, no fever/chill. Is having his chronic difficulty urinating that involves straining but no dysuria or hematuria. At baseline has poor hydration by mouth.   Review Of Systems: Per HPI with the following additions:  Review of Systems  Constitutional: Negative for chills, fever and malaise/fatigue.  Respiratory: Negative for shortness of breath.   Cardiovascular: Negative for chest pain and palpitations.  Gastrointestinal: Positive for abdominal pain, constipation and nausea. Negative for blood in stool, diarrhea, heartburn and vomiting.  Genitourinary: Negative for dysuria and frequency.  Weak stream, straining with urination  Neurological: Negative for dizziness and headaches.    Patient Active Problem List   Diagnosis Date Noted  . Essential hypertension   . Second degree AV block 11/29/2014  . Abnormal esophagram 09/07/2014  . Pacemaker - Medtronic Adapta 2006, gen change 2015  07/23/2013  . Chronic right hip pain 09/22/2012  . Testicular swelling, right 09/22/2012  . Presbycusis 05/14/2012  . Long term current use of anticoagulant 01/30/2012  . Paroxysmal atrial fibrillation ([place]) 01/30/2012  . Hx SBO 01/30/2012  . S/P appendectomy 01/30/2012  . CHRONIC KIDNEY DISEASE STAGE III (MODERATE) 12/03/2010  . ALLERGIC RHINITIS 09/05/2009  . HOARSENESS 05/15/2009  . HIATAL HERNIA 12/14/2008  . Diverticulosis of large intestine 12/14/2008  . COLONIC POLYPS, HYPERPLASTIC, HX OF 12/14/2008  . ESOPHAGEAL STRICTURE 10/04/2008  . Other vitamin B12 deficiency anemia 01/26/2008  . GLAUCOMA 01/14/2007  . CATARACT 01/14/2007  . SICK SINUS SYNDROME 01/14/2007  . GASTROESOPHAGEAL REFLUX, NO ESOPHAGITIS 01/14/2007    Past Medical History: Past Medical History:  Diagnosis Date  . Acute gastritis without mention of hemorrhage   . Arthritis   . Atrial fibrillation ([place])   . Chronic kidney disease, stage III (moderate)   . Diverticulosis   . Diverticulosis of colon (without mention of hemorrhage)   . Environmental allergies    HISTORY OF  . Esophageal dysmotility   . Family history of malignant neoplasm of gastrointestinal tract   . GERD (gastroesophageal reflux disease)   . Gout   . Hiatal hernia   . History of skin cancer   . Hypertension   . Hypertrophy of prostate with urinary obstruction and other lower urinary tract symptoms (LUTS)   . Overactive bladder   . Personal history of colonic polyps 10/21/2006   hyperplastic   . Presence of permanent cardiac pacemaker 10/14/05   medtronic Adapta  . Second degree AV block 11/29/2014  . Sinus node dysfunction ([facility])   . Small bowel obstruction ([place])   . Stricture and stenosis of esophagus   . Type II or unspecified type diabetes mellitus without mention of complication, not stated as uncontrolled    pt states that he checks his blood sugar once weekly, is not on oral meds or insulin  . Unspecified cataract   .  Vitamin B12 deficiency     Past Surgical History: Past Surgical History:  Procedure Laterality Date  . APPENDECTOMY    . CARDIOVERSION  10/01/2011   Procedure: CARDIOVERSION;  Surgeon: Dani Gobble [name];  Location: [facility] OR;  Service: Cardiovascular;  Laterality: N/A;  OP CARDIOVERSION  . CARDIOVERSION  11/02/12   successful  . ESOPHAGOGASTRODUODENOSCOPY (EGD) WITH PROPOFOL N/A 09/22/2014   Procedure: ESOPHAGOGASTRODUODENOSCOPY (EGD) WITH PROPOFOL;  Surgeon: Jerene Bears, MD;  Location: [facility] ENDOSCOPY;  Service: Gastroenterology;  Laterality: N/A;  . EYE SURGERY Bilateral years ago   cataract lens replacment  . PACEMAKER INSERTION  replaced 2 months ago, total of 8 years with pacemaker  . PERMANENT PACEMAKER GENERATOR CHANGE N/A 03/14/2014   Procedure: PERMANENT PACEMAKER GENERATOR CHANGE;  Surgeon: Sanda Klein, MD; Medtronic Adapta L model number [place], serial number [id number]   . SAVORY DILATION N/A 09/22/2014   Procedure: SAVORY DILATION;  Surgeon: Jerene Bears, MD;  Location: [facility] ENDOSCOPY;  Service: Gastroenterology;  Laterality: N/A;  . TONSILLECTOMY    . TOTAL HIP ARTHROPLASTY Bilateral     Social History: Social History  Substance Use Topics  . Smoking status: Former Smoker    Packs/day: 1.00  Years: 20.00    Types: Cigarettes    Quit date: 11/17/1969  . Smokeless tobacco: Never Used  . Alcohol use No     Comment: none since 1990   Additional social history: Lives alone  Please also refer to relevant sections of EMR.  Family History: Family History  Problem Relation Age of Onset  . Parkinsonism Mother   . Hypertension Father   . Colon cancer Brother 38  . Hypertension Brother   . Hodgkin's lymphoma Grandchild      Allergies and Medications: Allergies  Allergen Reactions  . Codeine Nausea Only   No current facility-administered medications on file prior to encounter.    Current Outpatient Prescriptions on File Prior to Encounter  Medication Sig Dispense Refill   . beta carotene w/minerals (OCUVITE) tablet Take 1 tablet by mouth every morning.     . Blood Glucose Monitoring Suppl (ONE TOUCH ULTRA SYSTEM KIT) w/Device KIT 1 kit by Does not apply route once. 1 each 0  . cephALEXin (KEFLEX) 500 MG capsule Take 1 capsule (500 mg total) by mouth 2 (two) times daily. 10 capsule 0  . Cyanocobalamin (VITAMIN B 12 PO) Take 1 capsule by mouth daily.    Marland Kitchen dronedarone (MULTAQ) 400 MG tablet Take 1 tablet (400 mg total) by mouth 2 (two) times daily with a meal. 180 tablet 3  . glucose blood test strip Use as instructed 100 each 12  . Lancets MISC Use with glucometer as directed 100 each 12  . nebivolol (BYSTOLIC) 10 MG tablet Take 1 tablet (10 mg total) by mouth every morning. 90 tablet 3  . pantoprazole (PROTONIX) 40 MG tablet Take 1 tablet (40 mg total) by mouth daily before breakfast. 90 tablet 3  . Polyethyl Glycol-Propyl Glycol (SYSTANE FREE OP) Place 1 drop into both eyes daily as needed. For dry eyes    . polyethylene glycol (MIRALAX / GLYCOLAX) packet Take 17 g by mouth daily. 14 each 0  . Probiotic Product (PHILLIPS COLON HEALTH PO) Take 1 tablet by mouth every morning.     . psyllium (HYDROCIL/METAMUCIL) 95 % PACK Take 1 packet by mouth daily. 56 each 2  . ramipril (ALTACE) 2.5 MG capsule Take 1 capsule (2.5 mg total) by mouth every morning. 90 capsule 3  . Rivaroxaban (XARELTO) 15 MG TABS tablet Take 1 tablet (15 mg total) by mouth at bedtime. 90 tablet 3  . silodosin (RAPAFLO) 4 MG CAPS capsule Take 2 capsules (8 mg total) by mouth daily with breakfast. 135 capsule 3  . torsemide (DEMADEX) 10 MG tablet One tablet twice a week, Mondays and Fridays. (Patient taking differently: Take 10 mg by mouth 2 (two) times a week. Mondays and Fridays.) 28 tablet 3  . traMADol (ULTRAM) 50 MG tablet Take by mouth 1-2 every 8-12 hours (Patient taking differently: Take 50-100 mg by mouth every 12 (twelve) hours as needed for moderate pain. ) 90 tablet 3    Objective: BP  127/66   Pulse 75   Temp 98.2 F (36.8 C) (Oral)   Resp 10   SpO2 100%  Exam: General: Lying in bed, in NAD Eyes: EOMI, conjunctiva normal ENTM: dry mucous membranes Neck: supple, normal ROM Cardiovascular: RRR, no murmurs noted Respiratory: CTAB, normal effort on room air Gastrointestinal: distended, mildly TTP over L upper/midepigastric area, soft, + bowel sounds. No rebound/guarding MSK: moving all limbs equally Derm: warm and dry, no rashes Neuro: alert and oriented, no focal deficits Psych: appropriate affect   Labs  and Imaging: CBC BMET   Recent Labs Lab 03/14/17 1226  WBC 8.1  HGB 12.6*  HCT 37.8*  PLT 186    Recent Labs Lab 03/14/17 1226  NA 132*  K 4.8  CL 96*  CO2 28  BUN 22*  CREATININE 1.35*  GLUCOSE 136*  CALCIUM 9.6     Urinalysis    Component Value Date/Time   COLORURINE YELLOW 03/14/2017 1245   APPEARANCEUR CLEAR 03/14/2017 1245   LABSPEC 1.013 03/14/2017 1245   PHURINE 7.0 03/14/2017 1245   GLUCOSEU NEGATIVE 03/14/2017 1245   [place] 03/14/2017 1245   [place] 03/14/2017 1245   BILIRUBINUR negative 02/06/2017 1050   BILIRUBINUR NEG 11/19/2016 1137   KETONESUR NEGATIVE 03/14/2017 1245   PROTEINUR NEGATIVE 03/14/2017 1245   UROBILINOGEN 0.2 02/06/2017 1050   UROBILINOGEN 0.2 12/30/2013 0256   NITRITE NEGATIVE 03/14/2017 1245   LEUKOCYTESUR NEGATIVE 03/14/2017 1245   FOBT negative Troponin 0.00  Dg Chest 2 View  Result Date: 03/14/2017 CLINICAL DATA:  Pt states epigastric pains x several days, no sob is noted, no cough, hx htn, diabetes EXAM: CHEST  2 VIEW COMPARISON:  06/28/2016 FINDINGS: Insert CVD There is no focal parenchymal opacity. There is no pleural effusion or pneumothorax. There is stable cardiomegaly. There is a dual lead cardiac pacemaker. The osseous structures are unremarkable. IMPRESSION: No active cardiopulmonary disease. Electronically Signed   By: Kathreen Devoid   On: 03/14/2017 13:00   Ct Abdomen  Pelvis W Contrast  Result Date: 03/14/2017 CLINICAL DATA:  Upper abdominal pain and nausea beginning yesterday. EXAM: CT ABDOMEN AND PELVIS WITH CONTRAST TECHNIQUE: Multidetector CT imaging of the abdomen and pelvis was performed using the standard protocol following bolus administration of intravenous contrast. CONTRAST:  149m ISOVUE-300 IOPAMIDOL (ISOVUE-300) INJECTION 61% COMPARISON:  June 27, 2016 FINDINGS: Lower chest: The pacer leads remain in place. The previously identified pleural effusions have resolved. No acute abnormalities are seen in the lung bases today. Hepatobiliary: A vascular anomaly is seen in the right hepatic lobe on series 3, image 13 of no acute significance. No suspicious hepatic masses are seen. The gallbladder is normal in appearance. The portal vein is patent. Pancreas: Pancreas is somewhat atrophic but grossly normal. Spleen: Normal in size without focal abnormality. Adrenals/Urinary Tract: Anterior bladder diverticula. The adrenal glands are normal. Tiny renal cysts. The kidneys are otherwise normal. No obstruction. Stomach/Bowel: The stomach is distended with air and fluid. The proximal small bowel is also distended with a transition point seen in the mid abdomen just to the left of midline best seen on sagittal image 119. More distal small bowel is decompressed. Streak artifact off of metal in a right lower quadrant loop of small bowel is likely ingested material and was not seen previously. Diverticulosis is seen without diverticulitis. The appendix is not visualized but there is no secondary evidence of appendicitis. Vascular/Lymphatic: Atherosclerotic changes seen in the non aneurysmal aorta. The infrarenal aorta is again noted to be mildly ectatic measuring up to 2.4 cm today. No dissection. No adenopathy. Reproductive: Prostate is unremarkable. Other: No free air or free fluid.  No acute abnormalities. Musculoskeletal: Degenerative changes in the right hip. The patient is  status post left hip replacement. Degenerative changes in the spine. Scalloping of the inferior endplate of L3 is a new finding. There is air in the disc space consistent with vacuum disc phenomena. No other bony changes. IMPRESSION: 1. Small bowel obstruction as above. 2. Metallic debris in a right lower quadrant  small bowel loop is likely ingested material. 3. Diverticulosis without diverticulitis. 4. Atherosclerosis. Ectasia of the infrarenal abdominal aorta measuring up to 2.4 cm. 5. Scalloping of the inferior plate of L3 is likely degenerative in nature with no adjacent soft tissue stranding. This is a new finding since August of 2017 but there is vacuum disc phenomena at this level. Electronically Signed   By: Dorise Bullion III M.D   On: 03/14/2017 15:31    Bufford Lope, DO 03/14/2017, 4:31 PM PGY-1, [place] pager: [phone], text pages welcome  Upper Level Addendum:  I have seen and evaluated this patient along with Dr. Shawna Orleans and reviewed the above note, making necessary revisions in pink.  Virginia Crews, MD, MPH PGY-3,  [facility] 03/14/2017 8:11 PM

## 2017-03-14 NOTE — ED Notes (Signed)
Patient transported to CT 

## 2017-03-14 NOTE — ED Provider Notes (Signed)
[facility] DEPT Provider Note   CSN: [id number] Arrival date & time: 03/14/17  1211     History   Chief Complaint Chief Complaint  Patient presents with  . Urinary Retention  . Constipation  . Hernia  . Cough    HPI [name] is a 83 y.o. male.She has vague historian complains of full feeling in his abdomen and some difficulty getting his urinary stream started though he does not feel he needs to urinate at present. Symptoms onset several days ago. He also feels sometimes as if "food gets stuck in my hiatal hernia" (points to epigastric area) though he has no discomfort at epigastric area presently. He is presently asymptomatic. He had a slight bowel movement this morning. He's been treating himself with MiraLAX regularly. No nausea or vomiting no chest pain no fever no shortness of breath no other associated symptoms. Nothing makes symptoms better or worse  HPI  Past Medical History:  Diagnosis Date  . Acute gastritis without mention of hemorrhage   . Arthritis   . Atrial fibrillation ([place])   . Chronic kidney disease, stage III (moderate)   . Diverticulosis   . Diverticulosis of colon (without mention of hemorrhage)   . Environmental allergies    HISTORY OF  . Esophageal dysmotility   . Family history of malignant neoplasm of gastrointestinal tract   . GERD (gastroesophageal reflux disease)   . Gout   . Hiatal hernia   . History of skin cancer   . Hypertension   . Hypertrophy of prostate with urinary obstruction and other lower urinary tract symptoms (LUTS)   . Overactive bladder   . Personal history of colonic polyps 10/21/2006   hyperplastic   . Presence of permanent cardiac pacemaker 10/14/05   medtronic Adapta  . Second degree AV block 11/29/2014  . Sinus node dysfunction (HCC)   . Small bowel obstruction   . Stricture and stenosis of esophagus   . Type II or unspecified type diabetes mellitus without mention of complication, not stated as uncontrolled      pt states that he checks his blood sugar once weekly, is not on oral meds or insulin  . Unspecified cataract   . Vitamin B12 deficiency     Patient Active Problem List   Diagnosis Date Noted  . Essential hypertension   . Second degree AV block 11/29/2014  . Abnormal esophagram 09/07/2014  . Pacemaker - Medtronic Adapta 2006, gen change 2015 07/23/2013  . Chronic right hip pain 09/22/2012  . Testicular swelling, right 09/22/2012  . Presbycusis 05/14/2012  . Long term current use of anticoagulant 01/30/2012  . Paroxysmal atrial fibrillation ([place]) 01/30/2012  . Hx SBO 01/30/2012  . S/P appendectomy 01/30/2012  . CHRONIC KIDNEY DISEASE STAGE III (MODERATE) 12/03/2010  . ALLERGIC RHINITIS 09/05/2009  . HOARSENESS 05/15/2009  . HIATAL HERNIA 12/14/2008  . Diverticulosis of large intestine 12/14/2008  . COLONIC POLYPS, HYPERPLASTIC, HX OF 12/14/2008  . ESOPHAGEAL STRICTURE 10/04/2008  . Other vitamin B12 deficiency anemia 01/26/2008  . GLAUCOMA 01/14/2007  . CATARACT 01/14/2007  . SICK SINUS SYNDROME 01/14/2007  . GASTROESOPHAGEAL REFLUX, NO ESOPHAGITIS 01/14/2007    Past Surgical History:  Procedure Laterality Date  . APPENDECTOMY    . CARDIOVERSION  10/01/2011   Procedure: CARDIOVERSION;  Surgeon: Dani Gobble [name];  Location: [facility] OR;  Service: Cardiovascular;  Laterality: N/A;  OP CARDIOVERSION  . CARDIOVERSION  11/02/12   successful  . ESOPHAGOGASTRODUODENOSCOPY (EGD) WITH PROPOFOL N/A 09/22/2014   Procedure: ESOPHAGOGASTRODUODENOSCOPY (EGD)  WITH PROPOFOL;  Surgeon: Jerene Bears, MD;  Location: [facility];  Service: Gastroenterology;  Laterality: N/A;  . EYE SURGERY Bilateral years ago   cataract lens replacment  . PACEMAKER INSERTION  replaced 2 months ago, total of 8 years with pacemaker  . PERMANENT PACEMAKER GENERATOR CHANGE N/A 03/14/2014   Procedure: PERMANENT PACEMAKER GENERATOR CHANGE;  Surgeon: Sanda Klein, MD; Medtronic Adapta L model number [place], serial number  [id number]   . SAVORY DILATION N/A 09/22/2014   Procedure: SAVORY DILATION;  Surgeon: Jerene Bears, MD;  Location: [facility];  Service: Gastroenterology;  Laterality: N/A;  . TONSILLECTOMY    . TOTAL HIP ARTHROPLASTY Bilateral        Home Medications    Prior to Admission medications   Medication Sig Start Date End Date Taking? Authorizing Provider  beta carotene w/minerals (OCUVITE) tablet Take 1 tablet by mouth every morning.     Historical Provider, MD  Blood Glucose Monitoring Suppl (ONE TOUCH ULTRA SYSTEM KIT) w/Device KIT 1 kit by Does not apply route once. 12/12/15   Dickie La, MD  cephALEXin (KEFLEX) 500 MG capsule Take 1 capsule (500 mg total) by mouth 2 (two) times daily. 12/12/16   Sherwood Gambler, MD  Cyanocobalamin (VITAMIN B 12 PO) Take 1 capsule by mouth daily.    Historical Provider, MD  dronedarone (MULTAQ) 400 MG tablet Take 1 tablet (400 mg total) by mouth 2 (two) times daily with a meal. 01/14/17   Mihai [name], MD  glucose blood test strip Use as instructed 12/12/15   Dickie La, MD  Lancets MISC Use with glucometer as directed 12/12/15   Dickie La, MD  nebivolol (BYSTOLIC) 10 MG tablet Take 1 tablet (10 mg total) by mouth every morning. 01/14/17   Sanda Klein, MD  pantoprazole (PROTONIX) 40 MG tablet Take 1 tablet (40 mg total) by mouth daily before breakfast. 11/19/16   Dickie La, MD  Polyethyl Glycol-Propyl Glycol (SYSTANE FREE OP) Place 1 drop into both eyes daily as needed. For dry eyes    Historical Provider, MD  polyethylene glycol (MIRALAX / GLYCOLAX) packet Take 17 g by mouth daily. 08/30/15   Veatrice Bourbon, MD  Probiotic Product (PHILLIPS COLON HEALTH PO) Take 1 tablet by mouth every morning.     Historical Provider, MD  psyllium (HYDROCIL/METAMUCIL) 95 % PACK Take 1 packet by mouth daily. 08/30/15   Veatrice Bourbon, MD  ramipril (ALTACE) 2.5 MG capsule Take 1 capsule (2.5 mg total) by mouth every morning. 01/14/17   Sanda Klein, MD  Rivaroxaban  (XARELTO) 15 MG TABS tablet Take 1 tablet (15 mg total) by mouth at bedtime. 04/08/16   Dickie La, MD  silodosin (RAPAFLO) 4 MG CAPS capsule Take 2 capsules (8 mg total) by mouth daily with breakfast. 02/06/17   Katheren Shams, DO  torsemide (DEMADEX) 10 MG tablet One tablet twice a week, Mondays and Fridays. Patient taking differently: Take 10 mg by mouth 2 (two) times a week. Mondays and Fridays. 03/12/16   Dickie La, MD  traMADol (ULTRAM) 50 MG tablet Take by mouth 1-2 every 8-12 hours Patient taking differently: Take 50-100 mg by mouth every 12 (twelve) hours as needed for moderate pain.  09/22/16   Dickie La, MD    Family History Family History  Problem Relation Age of Onset  . Parkinsonism Mother   . Hypertension Father   . Colon cancer Brother 33  . Hypertension Brother   .  Hodgkin's lymphoma Grandchild     Social History Social History  Substance Use Topics  . Smoking status: Former Smoker    Packs/day: 1.00    Years: 20.00    Types: Cigarettes    Quit date: 11/17/1969  . Smokeless tobacco: Never Used  . Alcohol use No     Comment: none since 1990     Allergies   Codeine   Review of Systems Review of Systems  Constitutional: Negative.   HENT: Negative.   Respiratory: Negative.   Cardiovascular: Negative.   Gastrointestinal: Positive for abdominal pain and constipation.  Genitourinary: Positive for dysuria.  Musculoskeletal: Negative.   Skin: Negative.   Neurological: Negative.   Psychiatric/Behavioral: Negative.   All other systems reviewed and are negative.    Physical Exam Updated Vital Signs BP 140/69   Pulse 84   Temp 98.2 F (36.8 C) (Oral)   Resp 16   SpO2 98%   Physical Exam  Constitutional: He appears well-developed and well-nourished.  HENT:  Head: Normocephalic and atraumatic.  Eyes: Conjunctivae are normal. Pupils are equal, round, and reactive to light.  Neck: Neck supple. No tracheal deviation present. No thyromegaly present.    Cardiovascular: Normal rate and regular rhythm.   No murmur heard. Pulmonary/Chest: Effort normal and breath sounds normal.  Abdominal: Soft. Bowel sounds are normal. He exhibits no distension. There is no tenderness.  Genitourinary: Rectum normal and penis normal. Rectal exam shows guaiac negative stool.  Genitourinary Comments: Rectum normal tone brown stool no gross blood Scrotum normal  Musculoskeletal: Normal range of motion. He exhibits no edema or tenderness.  Neurological: He is alert. Coordination normal.  Skin: Skin is warm and dry. No rash noted.  Psychiatric: He has a normal mood and affect.  Nursing note and vitals reviewed.    ED Treatments / Results  Labs (all labs ordered are listed, but only abnormal results are displayed) Labs Reviewed  CBC - Abnormal; Notable for the following:       Result Value   Hemoglobin 12.6 (*)    HCT 37.8 (*)    All other components within normal limits  COMPREHENSIVE METABOLIC PANEL - Abnormal; Notable for the following:    Sodium 132 (*)    Chloride 96 (*)    Glucose, Bld 136 (*)    BUN 22 (*)    Creatinine, Ser 1.35 (*)    GFR calc non Af Amer 43 (*)    GFR calc Af Amer 50 (*)    All other components within normal limits  URINALYSIS, ROUTINE W REFLEX MICROSCOPIC  I-STAT TROPOININ, ED  POC OCCULT BLOOD, ED    EKG  EKG Interpretation  Date/Time:  Saturday March 14 2017 12:22:07 EDT Ventricular Rate:  85 PR Interval:  140 QRS Duration: 186 QT Interval:  456 QTC Calculation: 542 R Axis:   -80 Text Interpretation:  AV dual-paced rhythm Abnormal ECG No significant change since last tracing Confirmed by Winfred Leeds  MD, Tae Robak [phone]) on 03/14/2017 12:54:31 PM       Radiology Dg Chest 2 View  Result Date: 03/14/2017 CLINICAL DATA:  Pt states epigastric pains x several days, no sob is noted, no cough, hx htn, diabetes EXAM: CHEST  2 VIEW COMPARISON:  06/28/2016 FINDINGS: Insert CVD There is no focal parenchymal opacity. There  is no pleural effusion or pneumothorax. There is stable cardiomegaly. There is a dual lead cardiac pacemaker. The osseous structures are unremarkable. IMPRESSION: No active cardiopulmonary disease. Electronically Signed   By:  Kathreen Devoid   On: 03/14/2017 13:00    Procedures Procedures (including critical care time)  Medications Ordered in ED Medications - No data to display  Results for orders placed or performed during the hospital encounter of 03/14/17  CBC  Result Value Ref Range   WBC 8.1 4.0 - 10.5 K/uL   RBC 4.25 4.22 - 5.81 MIL/uL   Hemoglobin 12.6 (L) 13.0 - 17.0 g/dL   HCT 37.8 (L) 39.0 - 52.0 %   MCV 88.9 78.0 - 100.0 fL   MCH 29.6 26.0 - 34.0 pg   MCHC 33.3 30.0 - 36.0 g/dL   RDW 14.7 11.5 - 15.5 %   Platelets 186 150 - 400 K/uL  Comprehensive metabolic panel  Result Value Ref Range   Sodium 132 (L) 135 - 145 mmol/L   Potassium 4.8 3.5 - 5.1 mmol/L   Chloride 96 (L) 101 - 111 mmol/L   CO2 28 22 - 32 mmol/L   Glucose, Bld 136 (H) 65 - 99 mg/dL   BUN 22 (H) 6 - 20 mg/dL   Creatinine, Ser 1.35 (H) 0.61 - 1.24 mg/dL   Calcium 9.6 8.9 - 10.3 mg/dL   Total Protein 6.8 6.5 - 8.1 g/dL   Albumin 4.2 3.5 - 5.0 g/dL   AST 21 15 - 41 U/L   ALT 27 17 - 63 U/L   Alkaline Phosphatase 61 38 - 126 U/L   Total Bilirubin 1.1 0.3 - 1.2 mg/dL   GFR calc non Af Amer 43 (L) >60 mL/min   GFR calc Af Amer 50 (L) >60 mL/min   Anion gap 8 5 - 15  Urinalysis, Routine w reflex microscopic  Result Value Ref Range   Color, Urine YELLOW YELLOW   APPearance CLEAR CLEAR   Specific Gravity, Urine 1.013 1.005 - 1.030   pH 7.0 5.0 - 8.0   Glucose, UA NEGATIVE NEGATIVE mg/dL   Hgb urine dipstick NEGATIVE NEGATIVE   Bilirubin Urine NEGATIVE NEGATIVE   Ketones, ur NEGATIVE NEGATIVE mg/dL   Protein, ur NEGATIVE NEGATIVE mg/dL   Nitrite NEGATIVE NEGATIVE   Leukocytes, UA NEGATIVE NEGATIVE  I-stat troponin, ED  Result Value Ref Range   Troponin i, poc 0.00 0.00 - 0.08 ng/mL   Comment 3           POC occult blood, ED  Result Value Ref Range   Fecal Occult Bld NEGATIVE NEGATIVE   Dg Chest 2 View  Result Date: 03/14/2017 CLINICAL DATA:  Pt states epigastric pains x several days, no sob is noted, no cough, hx htn, diabetes EXAM: CHEST  2 VIEW COMPARISON:  06/28/2016 FINDINGS: Insert CVD There is no focal parenchymal opacity. There is no pleural effusion or pneumothorax. There is stable cardiomegaly. There is a dual lead cardiac pacemaker. The osseous structures are unremarkable. IMPRESSION: No active cardiopulmonary disease. Electronically Signed   By: Kathreen Devoid   On: 03/14/2017 13:00   Ct Abdomen Pelvis W Contrast  Result Date: 03/14/2017 CLINICAL DATA:  Upper abdominal pain and nausea beginning yesterday. EXAM: CT ABDOMEN AND PELVIS WITH CONTRAST TECHNIQUE: Multidetector CT imaging of the abdomen and pelvis was performed using the standard protocol following bolus administration of intravenous contrast. CONTRAST:  147m ISOVUE-300 IOPAMIDOL (ISOVUE-300) INJECTION 61% COMPARISON:  June 27, 2016 FINDINGS: Lower chest: The pacer leads remain in place. The previously identified pleural effusions have resolved. No acute abnormalities are seen in the lung bases today. Hepatobiliary: A vascular anomaly is seen in the right hepatic lobe  on series 3, image 13 of no acute significance. No suspicious hepatic masses are seen. The gallbladder is normal in appearance. The portal vein is patent. Pancreas: Pancreas is somewhat atrophic but grossly normal. Spleen: Normal in size without focal abnormality. Adrenals/Urinary Tract: Anterior bladder diverticula. The adrenal glands are normal. Tiny renal cysts. The kidneys are otherwise normal. No obstruction. Stomach/Bowel: The stomach is distended with air and fluid. The proximal small bowel is also distended with a transition point seen in the mid abdomen just to the left of midline best seen on sagittal image 119. More distal small bowel is decompressed.  Streak artifact off of metal in a right lower quadrant loop of small bowel is likely ingested material and was not seen previously. Diverticulosis is seen without diverticulitis. The appendix is not visualized but there is no secondary evidence of appendicitis. Vascular/Lymphatic: Atherosclerotic changes seen in the non aneurysmal aorta. The infrarenal aorta is again noted to be mildly ectatic measuring up to 2.4 cm today. No dissection. No adenopathy. Reproductive: Prostate is unremarkable. Other: No free air or free fluid.  No acute abnormalities. Musculoskeletal: Degenerative changes in the right hip. The patient is status post left hip replacement. Degenerative changes in the spine. Scalloping of the inferior endplate of L3 is a new finding. There is air in the disc space consistent with vacuum disc phenomena. No other bony changes. IMPRESSION: 1. Small bowel obstruction as above. 2. Metallic debris in a right lower quadrant small bowel loop is likely ingested material. 3. Diverticulosis without diverticulitis. 4. Atherosclerosis. Ectasia of the infrarenal abdominal aorta measuring up to 2.4 cm. 5. Scalloping of the inferior plate of L3 is likely degenerative in nature with no adjacent soft tissue stranding. This is a new finding since August of 2017 but there is vacuum disc phenomena at this level. Electronically Signed   By: Dorise Bullion III M.D   On: 03/14/2017 15:31    X-rays viewed by me Initial Impression / Assessment and Plan / ED Course  I have reviewed the triage vital signs and the nursing notes.  Pertinent labs & imaging results that were available during my care of the patient were reviewed by me and considered in my medical decision making (see chart for details).   4 30 p.m. patient remains comfortable.[facility] family medicine service who will arrange for overnight stay    Final Clinical Impressions(s) / ED Diagnoses  Diagnosis small bowel obstruction Final diagnoses:  None     New Prescriptions New Prescriptions   No medications on file     Orlie Dakin, MD 03/14/17 1641

## 2017-03-14 NOTE — ED Notes (Signed)
Patient was bladder scanned, 160 mLs in bladder

## 2017-03-15 DIAGNOSIS — I441 Atrioventricular block, second degree: Secondary | ICD-10-CM | POA: Diagnosis present

## 2017-03-15 DIAGNOSIS — K56600 Partial intestinal obstruction, unspecified as to cause: Secondary | ICD-10-CM | POA: Diagnosis present

## 2017-03-15 DIAGNOSIS — L8990 Pressure ulcer of unspecified site, unspecified stage: Secondary | ICD-10-CM | POA: Insufficient documentation

## 2017-03-15 DIAGNOSIS — Z86010 Personal history of colonic polyps: Secondary | ICD-10-CM | POA: Diagnosis not present

## 2017-03-15 DIAGNOSIS — I5032 Chronic diastolic (congestive) heart failure: Secondary | ICD-10-CM | POA: Diagnosis present

## 2017-03-15 DIAGNOSIS — E861 Hypovolemia: Secondary | ICD-10-CM | POA: Diagnosis present

## 2017-03-15 DIAGNOSIS — N183 Chronic kidney disease, stage 3 (moderate): Secondary | ICD-10-CM | POA: Diagnosis present

## 2017-03-15 DIAGNOSIS — N179 Acute kidney failure, unspecified: Secondary | ICD-10-CM | POA: Diagnosis present

## 2017-03-15 DIAGNOSIS — H9110 Presbycusis, unspecified ear: Secondary | ICD-10-CM | POA: Diagnosis present

## 2017-03-15 DIAGNOSIS — N138 Other obstructive and reflux uropathy: Secondary | ICD-10-CM | POA: Diagnosis present

## 2017-03-15 DIAGNOSIS — R638 Other symptoms and signs concerning food and fluid intake: Secondary | ICD-10-CM | POA: Diagnosis present

## 2017-03-15 DIAGNOSIS — K567 Ileus, unspecified: Secondary | ICD-10-CM | POA: Diagnosis present

## 2017-03-15 DIAGNOSIS — E871 Hypo-osmolality and hyponatremia: Secondary | ICD-10-CM | POA: Diagnosis present

## 2017-03-15 DIAGNOSIS — N401 Enlarged prostate with lower urinary tract symptoms: Secondary | ICD-10-CM | POA: Diagnosis present

## 2017-03-15 DIAGNOSIS — K5730 Diverticulosis of large intestine without perforation or abscess without bleeding: Secondary | ICD-10-CM | POA: Diagnosis present

## 2017-03-15 DIAGNOSIS — Z66 Do not resuscitate: Secondary | ICD-10-CM | POA: Diagnosis present

## 2017-03-15 DIAGNOSIS — E538 Deficiency of other specified B group vitamins: Secondary | ICD-10-CM | POA: Diagnosis present

## 2017-03-15 DIAGNOSIS — K56609 Unspecified intestinal obstruction, unspecified as to partial versus complete obstruction: Secondary | ICD-10-CM | POA: Diagnosis present

## 2017-03-15 DIAGNOSIS — D649 Anemia, unspecified: Secondary | ICD-10-CM | POA: Diagnosis present

## 2017-03-15 DIAGNOSIS — Z7901 Long term (current) use of anticoagulants: Secondary | ICD-10-CM | POA: Diagnosis not present

## 2017-03-15 DIAGNOSIS — E860 Dehydration: Secondary | ICD-10-CM | POA: Diagnosis present

## 2017-03-15 DIAGNOSIS — I130 Hypertensive heart and chronic kidney disease with heart failure and stage 1 through stage 4 chronic kidney disease, or unspecified chronic kidney disease: Secondary | ICD-10-CM | POA: Diagnosis present

## 2017-03-15 DIAGNOSIS — E1122 Type 2 diabetes mellitus with diabetic chronic kidney disease: Secondary | ICD-10-CM | POA: Diagnosis present

## 2017-03-15 DIAGNOSIS — I480 Paroxysmal atrial fibrillation: Secondary | ICD-10-CM | POA: Diagnosis present

## 2017-03-15 DIAGNOSIS — K219 Gastro-esophageal reflux disease without esophagitis: Secondary | ICD-10-CM | POA: Diagnosis present

## 2017-03-15 DIAGNOSIS — H409 Unspecified glaucoma: Secondary | ICD-10-CM | POA: Diagnosis present

## 2017-03-15 LAB — BASIC METABOLIC PANEL
Anion gap: 8 (ref 5–15)
BUN: 16 mg/dL (ref 6–20)
CHLORIDE: 103 mmol/L (ref 101–111)
CO2: 23 mmol/L (ref 22–32)
Calcium: 8.5 mg/dL — ABNORMAL LOW (ref 8.9–10.3)
Creatinine, Ser: 1.1 mg/dL (ref 0.61–1.24)
GFR calc non Af Amer: 55 mL/min — ABNORMAL LOW (ref >60)
Glucose, Bld: 131 mg/dL — ABNORMAL HIGH (ref 65–99)
Potassium: 4.1 mmol/L (ref 3.5–5.1)
Sodium: 134 mmol/L — ABNORMAL LOW (ref 135–145)

## 2017-03-15 LAB — CBC
HEMATOCRIT: 33.1 % — AB (ref 39.0–52.0)
Hemoglobin: 11 g/dL — ABNORMAL LOW (ref 13.0–17.0)
MCH: 29.6 pg (ref 26.0–34.0)
MCHC: 33.2 g/dL (ref 30.0–36.0)
MCV: 89.2 fL (ref 78.0–100.0)
Platelets: 154 10*3/uL (ref 150–400)
RBC: 3.71 MIL/uL — AB (ref 4.22–5.81)
RDW: 14.9 % (ref 11.5–15.5)
WBC: 4.8 10*3/uL (ref 4.0–10.5)

## 2017-03-15 NOTE — Progress Notes (Signed)
Pt was admitted from ED accompanied by an RN pt fully alert and oriented on arrival to the floor, ID bracelet checked self introduced to pt, oriented to his room and pt care equipment, fall assessment done and fall prevention plan discussed with the pt, skin assessment done, prescribed treatment started, call light and phone within reach pt able to demonstrate how to use them, will continue to monitor pt

## 2017-03-15 NOTE — Care Management Obs Status (Signed)
[facility] NOTIFICATION   Patient Details  Name: [name] MRN: [id number] Date of Birth: 12-24-1919   Medicare Observation Status Notification Given:  Yes    Maryclare Labrador, RN 03/15/2017, 2:39 PM

## 2017-03-15 NOTE — Progress Notes (Signed)
Family Medicine Teaching Service Daily Progress Note Intern Pager: [phone]  Patient name: [name] Medical record number: [id number] Date of birth: 1920/08/07 Age: 83 y.o. Gender: male  Primary Care Provider: [name], MD Consultants: None Code Status: DNR/DNI (per discussion on admission)  Pt Overview and Major Events to Date:  4/28 admit to FPTS  Assessment and Plan: [name] is a 83 y.o. male presenting with abdominal discomfort 2/2 SBO. PMH is significant for previous SBO, HTN, T2DM, GERD, gout, h/o diverticulosis, CKD Stage III, Afib on xarelto, BPH.  Partial Small bowel obstruction. Seen on CT abd with proximal small bowel is also distended with a transition point seen in the mid abdomen just to the left of midline. Has had previous hospitalizations for this, last 06/2016, has been told not surgical candidate d/t age. Endorses BM here in the hospital, denies nausea this AM, no emesis. - ADAT, start liquids this AM - D5 NS@125cc /hr - home metamucil qd and miralax BID - consider NG tube placement for gastric decompression if worsens - zofran ODT 4mg  q8h prn for nausea  BPH: On rapaflo and has failed flomax in the past.  -Flomax 0.4 qd while here since rapaflo not on formulary - though as he has failed this in the past, may need to request non-formulary -Bladder scan for decreased UOP and may need foley catheter if retaining without home medication  - Added strict I/O this AM  Mild AKI on CKD: Cr improved at 1.1 from 1.35 on admit. Recent baseline Cr appears to be ~1.1. Mild elevation in Cr likely secondary to dehydration from poor PO intake.  - mIVF as above, can DC when taking PO  Hyponatremia, chronic: Very mild with Na 132 at admissio >>>>134. Likely related to hypovolemia but also chronic "tea and toast" diet.  -correct with NS  - monitor with AM BMET  Afibat home on xarelto,multaq, nebivolol.  - continue home meds  HTN, currently normotensive. At  home on ramipril 2.5mg  qd at home.  - continue home meds - monitor BP  H/o T2DMdiet controlled. last a1c 11/19/16 was 6.1. 131 on AM BMET - monitor on BMET  H/o HFpEF and high grade 2nd degree AV block. Has dual chamber pacemaker and followed by cardiology. Last echo was 2006 with preserved EF and currently euvolemic. - Hold home torsemide (takes Mondays and Fridays) while receiving IVF - Continue home bystolic   FEN/GI: D5NS@125 , NPO except sips with meds Prophylaxis: on xarelto  Disposition: Home when shows he can tolerate PO   Subjective:  No acute events overnight. Patient speaking to himself in the dark when I went to check on him this AM. Denies abdominal pain, denies nausea. Endorses having BM here in the hospital, endorses urinating normally.   Objective: Temp:  [97.5 F (36.4 C)-98.2 F (36.8 C)] 97.5 F (36.4 C) (04/29 0453) Pulse Rate:  [71-91] 91 (04/29 0453) Resp:  [10-18] 17 (04/29 0453) BP: (117-155)/(56-82) 130/64 (04/29 0453) SpO2:  [93 %-100 %] 100 % (04/29 0453) Weight:  [181 lb 14.4 oz (82.5 kg)] 181 lb 14.4 oz (82.5 kg) (04/28 2033) Physical Exam: General: NAD, rests comfortably in bed, hard of hearing Cardiovascular: RRR, no m/r/g Respiratory: CTA bil, no W/R/R Abdomen: soft, nontender, nondistended, normoactive BS, no rebound or guarding Extremities: warm and well-perfused  Laboratory:  Recent Labs Lab 03/14/17 1226  WBC 8.1  HGB 12.6*  HCT 37.8*  PLT 186    Recent Labs Lab 03/14/17 1226 03/15/17 0547  NA  132* 134*  K 4.8 4.1  CL 96* 103  CO2 28 23  BUN 22* 16  CREATININE 1.35* 1.10  CALCIUM 9.6 8.5*  PROT 6.8  --   BILITOT 1.1  --   ALKPHOS 61  --   ALT 27  --   AST 21  --   GLUCOSE 136* 131*   FOBT negative Troponin 0.00  Imaging/Diagnostic Tests: Dg Chest 2 View 03/14/2017 IMPRESSION: No active cardiopulmonary disease. Electronically Signed   By: Kathreen Devoid   On: 03/14/2017 13:00   Ct Abdomen Pelvis W Contrast  03/14/2017 Stomach/Bowel: The stomach is distended with air and fluid. The proximal small bowel is also distended with a transition point seen in the mid abdomen just to the left of midline best seen on sagittal image 119. More distal small bowel is decompressed. Streak artifact off of metal in a right lower quadrant loop of small bowel is likely ingested material and was not seen previously. Diverticulosis is seen without diverticulitis. The appendix is not visualized but there is no secondary evidence of appendicitis.  IMPRESSION:  1. Small bowel obstruction as above.  2. Metallic debris in a right lower quadrant small bowel loop is likely ingested material.  (thought to be hershey kiss wrappers) 3. Diverticulosis without diverticulitis.  4. Atherosclerosis. Ectasia of the infrarenal abdominal aorta measuring up to 2.4 cm.  5. Scalloping of the inferior plate of L3 is likely degenerative in nature with no adjacent soft tissue stranding. This is a new finding since August of 2017 but there is vacuum disc phenomena at this level.    Everrett Coombe, MD 03/15/2017, 7:19 AM PGY-1, [facility] Intern pager: [phone], text pages welcome

## 2017-03-15 NOTE — Evaluation (Signed)
Physical Therapy Evaluation Patient Details Name: [name] MRN: [id number] DOB: [birth year] Today's Date: 03/15/2017   History of Present Illness  Patient is a 83 yo male admitted 03/14/17 with abdominal pain due to partial SBO.    PMH:  prior SBO, HTN, DM, gout, CKD, Afib, CHF, pacemaker, Lt THA  Clinical Impression  Patient presents with problems listed below.  Will benefit from acute PT to maximize functional mobility prior to return home alone.  Patient close to baseline mobility.  Do not anticipate any f/u PT needs at d/c.    Follow Up Recommendations No PT follow up;Supervision - Intermittent    Equipment Recommendations  None recommended by PT    Recommendations for Other Services       Precautions / Restrictions Precautions Precautions: None Restrictions Weight Bearing Restrictions: No      Mobility  Bed Mobility               General bed mobility comments: Patient in a chair  Transfers Overall transfer level: Needs assistance Equipment used: Rolling walker (2 wheeled) Transfers: Sit to/from Stand Sit to Stand: Supervision         General transfer comment: Patient uses safe technique.  Supervision for safety only.  Ambulation/Gait Ambulation/Gait assistance: Supervision Ambulation Distance (Feet): 170 Feet Assistive device: Rolling walker (2 wheeled) Gait Pattern/deviations: Step-through pattern;Decreased stance time - right;Decreased step length - left;Decreased stride length;Decreased weight shift to right;Trunk flexed Gait velocity: decreased   General Gait Details: Patient with slow, steady gait with RW.  Antalgic gait on RLE.  Stairs            Wheelchair Mobility    Modified Rankin (Stroke Patients Only)       Balance Overall balance assessment: Needs assistance Sitting-balance support: No upper extremity supported;Feet supported Sitting balance-Leahy Scale: Good     Standing balance support: No upper extremity  supported Standing balance-Leahy Scale: Fair                               Pertinent Vitals/Pain Pain Assessment: No/denies pain    Home Living Family/patient expects to be discharged to:: Private residence Living Arrangements: Alone Available Help at Discharge: Family;Neighbor;Available PRN/intermittently Joslyn Hy and daughter-in-law assist patient and call daily) Type of Home: House Home Access: Stairs to enter Entrance Stairs-Rails: Right Entrance Stairs-Number of Steps: 2 Home Layout: One level Home Equipment: Walker - 2 wheels;Shower seat - built in;Toilet riser;Bedside commode;Walker - 4 wheels;Cane - single point;Wheelchair - manual      Prior Function Level of Independence: Independent with assistive device(s);Needs assistance   Gait / Transfers Assistance Needed: Patient uses RW for gait.  ADL's / Homemaking Assistance Needed: Independent with bathing, dressing, meals.  [place]/daughter-in-law assist with laundry, housekeeping, yardwork  Comments: Patient drives short distances.  Does his grocery shopping, goes to [employer] "for 1 meal each day".     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  Has intermittent pain.  "I need a replacement"    Cervical / Trunk Assessment Cervical / Trunk Assessment: Kyphotic  Communication   Communication: HOH (Hearing aids)  Cognition Arousal/Alertness: Awake/alert Behavior During Therapy: WFL for tasks assessed/performed Overall Cognitive Status: Within Functional Limits for tasks  assessed                                        General Comments      Exercises     Assessment/Plan    PT Assessment Patient needs continued PT  services  PT Problem List Decreased strength;Decreased balance;Decreased mobility       PT Treatment Interventions DME instruction;Gait training;Stair training;Functional mobility training;Therapeutic activities;Therapeutic exercise;Patient/family education    PT Goals (Current goals can be found in the Care Plan section)  Acute Rehab PT Goals Patient Stated Goal: To go home soon PT Goal Formulation: With patient Time For Goal Achievement: 03/22/17 Potential to Achieve Goals: Good    Frequency Min 3X/week   Barriers to discharge Decreased caregiver support Lives alone, and has good support of family, neighbors.    Co-evaluation               End of Session Equipment Utilized During Treatment: Gait belt Activity Tolerance: Patient tolerated treatment well Patient left: in chair;with call bell/phone within reach;with chair alarm set Nurse Communication: Mobility status PT Visit Diagnosis: Unsteadiness on feet (R26.81);Muscle weakness (generalized) (M62.81);Other abnormalities of gait and mobility (R26.89)    Time: 1126-1140 PT Time Calculation (min) (ACUTE ONLY): 14 min   Charges:   PT Evaluation $PT Eval Moderate Complexity: 1 Procedure     PT G Codes:   PT G-Codes **NOT FOR INPATIENT CLASS** Functional Assessment Tool Used: AM-PAC 6 Clicks Basic Mobility;Clinical judgement Functional Limitation: Mobility: Walking and moving around Mobility: Walking and Moving Around Current Status (X9024): At least 20 percent but less than 40 percent impaired, limited or restricted Mobility: Walking and Moving Around Goal Status [phone]): At least 1 percent but less than 20 percent impaired, limited or restricted    Carita Pian. Sanjuana Kava, [facility] Acute Rehab Services Pager [phone]   Despina Pole 03/15/2017, 12:03 PM

## 2017-03-16 DIAGNOSIS — D649 Anemia, unspecified: Secondary | ICD-10-CM

## 2017-03-16 LAB — BASIC METABOLIC PANEL
ANION GAP: 7 (ref 5–15)
BUN: 11 mg/dL (ref 6–20)
CHLORIDE: 102 mmol/L (ref 101–111)
CO2: 24 mmol/L (ref 22–32)
Calcium: 8.4 mg/dL — ABNORMAL LOW (ref 8.9–10.3)
Creatinine, Ser: 0.93 mg/dL (ref 0.61–1.24)
GFR calc Af Amer: >60 mL/min (ref >60)
GFR calc non Af Amer: >60 mL/min (ref >60)
GLUCOSE: 127 mg/dL — AB (ref 65–99)
POTASSIUM: 4.4 mmol/L (ref 3.5–5.1)
Sodium: 133 mmol/L — ABNORMAL LOW (ref 135–145)

## 2017-03-16 LAB — CBC
HCT: 31.4 % — ABNORMAL LOW (ref 39.0–52.0)
Hemoglobin: 10.5 g/dL — ABNORMAL LOW (ref 13.0–17.0)
MCH: 30.2 pg (ref 26.0–34.0)
MCHC: 33.4 g/dL (ref 30.0–36.0)
MCV: 90.2 fL (ref 78.0–100.0)
PLATELETS: 132 10*3/uL — AB (ref 150–400)
RBC: 3.48 MIL/uL — AB (ref 4.22–5.81)
RDW: 15.1 % (ref 11.5–15.5)
WBC: 5 10*3/uL (ref 4.0–10.5)

## 2017-03-16 MED ORDER — PSYLLIUM 95 % PO PACK: 1.0000 | PACK | Freq: Two times a day (BID) | ORAL | 2 refills | Status: DC

## 2017-03-16 NOTE — Progress Notes (Signed)
[name] to be D/C'd Home per MD order.  Discussed with the patient and all questions fully answered.  VSS, Skin clean, dry and intact without evidence of skin break down, no evidence of skin tears noted. IV catheter discontinued intact. Site without signs and symptoms of complications. Dressing and pressure applied.  An After Visit Summary was printed and given to the patient.   D/c education completed with patient/family including follow up instructions, medication list, d/c activities limitations if indicated, with other d/c instructions as indicated by MD - patient able to verbalize understanding, all questions fully answered.   Patient instructed to return to ED, call 911, or call MD for any changes in condition.   Patient escorted via [place], and D/C home via private auto.  Karolee Ohs 03/16/2017 5:53 PM

## 2017-03-16 NOTE — Discharge Summary (Signed)
[facility] Discharge Summary  Patient name: [name] Medical record number: [id number] Date of birth: [birth year] Age: 83 y.o. Gender: male Date of Admission: 03/14/2017  Date of Discharge: 4/28 Admitting Physician: Lind Covert, MD  Primary Care Provider: [name], MD Consultants: None  Indication for Hospitalization: ileus/partial SBO  Discharge Diagnoses/Problem List:  Patient Active Problem List   Diagnosis Date Noted  . Normocytic anemia   . Pressure injury of skin 03/15/2017  . SBO (small bowel obstruction) ([place]) 03/14/2017  . Essential hypertension   . Small bowel obstruction ([place])   . Second degree AV block 11/29/2014  . Abnormal esophagram 09/07/2014  . Pacemaker - Medtronic Adapta 2006, gen change 2015 07/23/2013  . Chronic right hip pain 09/22/2012  . Testicular swelling, right 09/22/2012  . Presbycusis 05/14/2012  . Long term current use of anticoagulant 01/30/2012  . Paroxysmal atrial fibrillation ([place]) 01/30/2012  . Hx SBO 01/30/2012  . S/P appendectomy 01/30/2012  . CHRONIC KIDNEY DISEASE STAGE III (MODERATE) 12/03/2010  . ALLERGIC RHINITIS 09/05/2009  . HOARSENESS 05/15/2009  . HIATAL HERNIA 12/14/2008  . Diverticulosis of large intestine 12/14/2008  . COLONIC POLYPS, HYPERPLASTIC, HX OF 12/14/2008  . ESOPHAGEAL STRICTURE 10/04/2008  . Other vitamin B12 deficiency anemia 01/26/2008  . GLAUCOMA 01/14/2007  . CATARACT 01/14/2007  . SICK SINUS SYNDROME 01/14/2007  . GASTROESOPHAGEAL REFLUX, NO ESOPHAGITIS 01/14/2007     Disposition: Home  Discharge Condition: Stable/Improved  Discharge Exam:  Temp:  [97.3 F (36.3 C)-97.9 F (36.6 C)] 97.9 F (36.6 C) (04/30 0504) Pulse Rate:  [71-72] 71 (04/30 0504) Resp:  [18] 18 (04/30 0504) BP: (116-138)/(50-54) 138/54 (04/30 0917) SpO2:  [95 %-98 %] 98 % (04/30 0504) Physical Exam: General: NAD, rests comfortably in his chair, hard of hearing Cardiovascular: RRR,  no m/r/g Respiratory: CTA bil, no W/R/R Abdomen: soft, nontender, nondistended, normoactive BS, no rebound or guarding Extremities: warm and well-perfused  Brief Hospital Course:  Patient was hospitalized with a ileus/partial small bowel movement. By the time he was admitted to the hospital he was having bowel movements.  He was placed on bowel rest overnight without any nausea or vomiting. His diet was slowly advanced over two days. He was able to tolerate food and keep himself hydrated by mouth with normal bowel movements and no vomiting prior to discharge. He was initially walked with PT and cleared for discharge, however subsequently indicated that he is interested in going to SNF to gain his strength back, upon reevaluation by PT SNF was recommended.  Insurance would not cover SNF and his daughter indicated he would have a safe discharge home.  At the time of discharge he was considered stable and safe for home.  Issues for Follow Up:  1. Consider recheck CBC for hgb, which trended down slowly while he was in the hospital. 2. Follow up continued ability to tolerate PO.  Significant Procedures: None  Significant Labs and Imaging:   Recent Labs Lab 03/14/17 1226 03/15/17 0714 03/16/17 0527  WBC 8.1 4.8 5.0  HGB 12.6* 11.0* 10.5*  HCT 37.8* 33.1* 31.4*  PLT 186 154 132*    Recent Labs Lab 03/14/17 1226 03/15/17 0547 03/16/17 0527  NA 132* 134* 133*  K 4.8 4.1 4.4  CL 96* 103 102  CO2 _0 GLUCOSE 136* 131* 127*  BUN 22* 16 11  CREATININE 1.35* 1.10 0.93  CALCIUM 9.6 8.5* 8.4*  ALKPHOS 61  --   --   AST  21  --   --   ALT 27  --   --   ALBUMIN 4.2  --   --       Results/Tests Pending at Time of Discharge: None  Discharge Medications:  Allergies as of 03/16/2017      Reactions   Codeine Nausea Only      Medication List    TAKE these medications   beta carotene w/minerals tablet Take 1 tablet by mouth daily.   dronedarone 400 MG tablet Commonly known  as:  MULTAQ Take 1 tablet (400 mg total) by mouth 2 (two) times daily with a meal.   glucose blood test strip Use as instructed   Lancets Misc Use with glucometer as directed   nebivolol 10 MG tablet Commonly known as:  BYSTOLIC Take 1 tablet (10 mg total) by mouth every morning. What changed:  when to take this   [place]augen KIT w/Device Kit 1 kit by Does not apply route once.   pantoprazole 40 MG tablet Commonly known as:  PROTONIX Take 1 tablet (40 mg total) by mouth daily before breakfast.   polyethylene glycol packet Commonly known as:  MIRALAX / GLYCOLAX Take 17 g by mouth daily. What changed:  when to take this  additional instructions   PROBIOTIC PO Take 1 tablet by mouth daily.   psyllium 95 % Pack Commonly known as:  HYDROCIL/METAMUCIL Take 1 packet by mouth 2 (two) times daily. What changed:  when to take this  Another medication with the same name was removed. Continue taking this medication, and follow the directions you see here.   ramipril 2.5 MG capsule Commonly known as:  ALTACE Take 1 capsule (2.5 mg total) by mouth every morning. What changed:  when to take this   Rivaroxaban 15 MG Tabs tablet Commonly known as:  XARELTO Take 1 tablet (15 mg total) by mouth at bedtime. What changed:  when to take this   silodosin 4 MG Caps capsule Commonly known as:  RAPAFLO Take 2 capsules (8 mg total) by mouth daily with breakfast. What changed:  when to take this   SYSTANE FREE OP Place 1 drop into both eyes 2 (two) times daily as needed (dry eyes). For dry eyes   torsemide 10 MG tablet Commonly known as:  DEMADEX One tablet twice a week, Mondays and Fridays. What changed:  how much to take  how to take this  when to take this  additional instructions   traMADol 50 MG tablet Commonly known as:  ULTRAM Take by mouth 1-2 every 8-12 hours What changed:  how much to take  how to take this  when to take this  reasons to  take this  additional instructions   vitamin B-12 500 MCG tablet Commonly known as:  CYANOCOBALAMIN Take 500 mcg by mouth daily.       Discharge Instructions: Please refer to Patient Instructions section of EMR for full details.  Patient was counseled important signs and symptoms that should prompt return to medical care, changes in medications, dietary instructions, activity restrictions, and follow up appointments.   Follow-Up Appointments: Follow-up Information    Eloise Levels, MD. Go on 03/18/2017.   Why:  _0 :30 PM for hospital f/u. Contact information: [place] [address] [phone]           Everrett Coombe, MD 03/19/2017, 2:51 PM PGY-1, [place]

## 2017-03-16 NOTE — Progress Notes (Signed)
Family Medicine Teaching Service Daily Progress Note Intern Pager: [phone]  Patient name: [name] Medical record number: [id number] Date of birth: 1920-10-31 Age: 83 y.o. Gender: male  Primary Care Provider: [name], MD Consultants: None Code Status: DNR/DNI (per discussion on admission)  Pt Overview and Major Events to Date:  4/28 admit to FPTS  Assessment and Plan: [name] is a 83 y.o. male presenting with abdominal discomfort 2/2 SBO. PMH is significant for previous SBO, HTN, T2DM, GERD, gout, h/o diverticulosis, CKD Stage III, Afib on xarelto, BPH.  Partial Small bowel obstruction, resolved - Seen on CT abd, has also had prior episodes, not a surgical candidate. Since admission has had BM, diet was advanced and now tolerating full diet with no nausea or emesis.  - likely DC today - home metamucil qd and miralax BID - PT recommending SNF for deconditioning, patient agreeable  BPH: On rapaflo and has failed flomax in the past. Flomax 0.4 qd while here since rapaflo not on formulary.  Good UOP 1150 ml over last 24H - DC with home med  Mild AKI on CKD, resolved: Cr improved at 0.93 from 1.35 on admit. Was likely prerenal and resolved with IVF - [facility] IV[facility], now taking normal diet  Anemia, acute, worsening - 12.4 on admission, baseline of ~12.  Trending down, now 10.5.  FOBT negative - continue to monitor  Hyponatremia, chronic: Very mild with Na 132 -134 throughout hospital stay. Likely 2/2 chronic "tea and toast" diet.  - can continue to monitor as outpt  Afibat home on xarelto,multaq, nebivolol.  - continue home meds  HTN, currently normotensive. At home on ramipril 2.5mg  qd - continue home meds - monitor BP  H/o T2DMdiet controlled. last a1c 11/19/16 was 6.1. 131 on AM BMET - monitor on BMET  H/o HFpEF and high grade 2nd degree AV block. Has dual chamber pacemaker and followed by cardiology. Last echo was 2006 with preserved EF and currently  euvolemic. - Hold home torsemide (takes Mondays and Fridays) while receiving IVF - Continue home bystolic  - restart torsemide at DC to be taken Friday (not today, soft pressures overnight)  FEN/GI: normal diet Prophylaxis: on xarelto  Disposition: Home when shows he can tolerate PO  Subjective:  Patient sits comfortably in his chair. Breakfast on his tray. States he tolerated liquid diet last night, has not had emesis, has had BM, no nausea.  States he does not feel ready to go home, feels he has become weaker in the hospital. Open to working with PT again.  Objective: Temp:  [97.3 F (36.3 C)-97.9 F (36.6 C)] 97.9 F (36.6 C) (04/30 0504) Pulse Rate:  [71-72] 71 (04/30 0504) Resp:  [18] 18 (04/30 0504) BP: (116-138)/(50-54) 138/54 (04/30 0917) SpO2:  [95 %-98 %] 98 % (04/30 0504) Physical Exam: General: NAD, rests comfortably in his chair, hard of hearing Cardiovascular: RRR, no m/r/g Respiratory: CTA bil, no W/R/R Abdomen: soft, nontender, nondistended, normoactive BS, no rebound or guarding Extremities: warm and well-perfused  Laboratory:  Recent Labs Lab 03/14/17 1226 03/15/17 0714 03/16/17 0527  WBC 8.1 4.8 5.0  HGB 12.6* 11.0* 10.5*  HCT 37.8* 33.1* 31.4*  PLT 186 154 132*    Recent Labs Lab 03/14/17 1226 03/15/17 0547 03/16/17 0527  NA 132* 134* 133*  K 4.8 4.1 4.4  CL 96* 103 102  CO2 28 23 24   BUN 22* 16 11  CREATININE 1.35* 1.10 0.93  CALCIUM 9.6 8.5* 8.4*  PROT 6.8  --   --  BILITOT 1.1  --   --   ALKPHOS 61  --   --   ALT 27  --   --   AST 21  --   --   GLUCOSE 136* 131* 127*   FOBT negative Troponin 0.00  Imaging/Diagnostic Tests: Dg Chest 2 View 03/14/2017 IMPRESSION: No active cardiopulmonary disease. Electronically Signed   By: Kathreen Devoid   On: 03/14/2017 13:00   Ct Abdomen Pelvis W Contrast 03/14/2017 Stomach/Bowel: The stomach is distended with air and fluid. The proximal small bowel is also distended with a transition point  seen in the mid abdomen just to the left of midline best seen on sagittal image 119. More distal small bowel is decompressed. Streak artifact off of metal in a right lower quadrant loop of small bowel is likely ingested material and was not seen previously. Diverticulosis is seen without diverticulitis. The appendix is not visualized but there is no secondary evidence of appendicitis.  IMPRESSION:  1. Small bowel obstruction as above.  2. Metallic debris in a right lower quadrant small bowel loop is likely ingested material.  (thought to be hershey kiss wrappers) 3. Diverticulosis without diverticulitis.  4. Atherosclerosis. Ectasia of the infrarenal abdominal aorta measuring up to 2.4 cm.  5. Scalloping of the inferior plate of L3 is likely degenerative in nature with no adjacent soft tissue stranding. This is a new finding since August of 2017 but there is vacuum disc phenomena at this level.    Everrett Coombe, MD 03/16/2017, 9:35 AM PGY-1, [facility] Intern pager: [phone], text pages welcome

## 2017-03-16 NOTE — Progress Notes (Signed)
Talked to patient's daughter in law, [place]. Clarified that patient has a safe discharge plan. Unfortunately insurance not covering SNF stay. [name] states that there will be someone with him at all times during the day and she feels comfortable caring for him at home. She states her father in law is "set in his ways" and insists on sleeping in his own home, so he is alone at night but has a cell phone and can call if he needs help. Will discharge patient under the condition that he has home health PT and close follow up.   Smitty Cords, MD [place], PGY-2

## 2017-03-16 NOTE — Plan of Care (Signed)
Problem: Bowel/Gastric: Goal: Will not experience complications related to bowel motility Outcome: Progressing Had bowel movement 03/15/17

## 2017-03-16 NOTE — Discharge Instructions (Signed)
You were admitted to the hospital for something we call "small bowel obstruction". Thankfully your symptoms have improved and you are stable to leave the hospital. Please continue to take Miralax and Metamucil at home. Reasons to return to the hospital: Unable to eat or drink all day, worsening abdominal pain, you pass out, or blood in stool.   Please follow up at the [facility] at the above appointment time. Take care!    Small Bowel Obstruction A small bowel obstruction means that something is blocking the small bowel. The small bowel is also called the small intestine. It is the long tube that connects the stomach to the colon. An obstruction will stop food and fluids from passing through the small bowel. Treatment depends on what is causing the problem and how bad the problem is. Follow these instructions at home:  Get a lot of rest.  Follow your diet as told by your doctor. You may need to:  Only drink clear liquids until you start to get better.  Avoid solid foods as told by your doctor.  Take over-the-counter and prescription medicines only as told by your doctor.  Keep all follow-up visits as told by your doctor. This is important. Contact a doctor if:  You have a fever.  You have chills. Get help right away if:  You have pain or cramps that get worse.  You throw up (vomit) blood.  You have a feeling of being sick to your stomach (nausea) that does not go away.  You cannot stop throwing up.  You cannot drink fluids.  You feel confused.  You feel dry or thirsty (dehydrated).  Your belly gets more bloated.  You feel weak or you pass out (faint). This information is not intended to replace advice given to you by your health care provider. Make sure you discuss any questions you have with your health care provider. Document Released: 12/11/2004 Document Revised: 06/30/2016 Document Reviewed: 12/28/2014 Elsevier Interactive Patient Education  2017  [employer].

## 2017-03-16 NOTE — Progress Notes (Signed)
CSW informed by PT that patient now being recommended for SNF.  CSW spoke with MD who states pt likely medically stable for DC today.  CSW spoke with pt and pt dtr-in-law ([name]) about Medicare requirement for 3 night stay to approve SNF- pt would not want to pay privately to go to SNF if insurance will not cover and would prefer home services if [name] prior to 3 night stay.  RNCM spoke with pt regarding home health and MD updated that pt and family agreeable with transfer home if pt is medically stable for DC  CSW signing off for now- please reconsult if plan changes and pt will be appropriate to stay for 3 night qualifying stay.  Jorge Ny, LCSW Clinical Social Worker [phone]

## 2017-03-16 NOTE — Progress Notes (Signed)
Physical Therapy Treatment Patient Details Name: [name] MRN: [id number] DOB: 24-Jan-1920 Today's Date: 03/16/2017    History of Present Illness Patient is a 83 yo male admitted 03/14/17 with abdominal pain due to partial SBO.    PMH:  prior SBO, HTN, DM, gout, CKD, Afib, CHF, pacemaker, Lt THA    PT Comments    Pt appears weaker today than yesterday and is requiring assist for basic mobility. His functional activity tolerance is poor and he lives alone. Recommend ST-SNF so pt can reach more independent level before he returns home alone.   Follow Up Recommendations  SNF     Equipment Recommendations  None recommended by PT    Recommendations for Other Services       Precautions / Restrictions Precautions Precautions: None Restrictions Weight Bearing Restrictions: No    Mobility  Bed Mobility               General bed mobility comments: Patient in a chair  Transfers Overall transfer level: Needs assistance Equipment used: Rolling walker (2 wheeled) Transfers: Sit to/from Stand Sit to Stand: Min assist         General transfer comment: Assist to bring hips up. Pt unable to rise without assist.  Ambulation/Gait Ambulation/Gait assistance: Min guard Ambulation Distance (Feet): 150 Feet Assistive device: Rolling walker (2 wheeled) Gait Pattern/deviations: Step-through pattern;Decreased stance time - right;Decreased step length - left;Trunk flexed;Shuffle Gait velocity: decreased Gait velocity interpretation: <1.8 ft/sec, indicative of risk for recurrent falls General Gait Details: Pt shaky with gait. Assist for balance and safety   Stairs            Wheelchair Mobility    Modified Rankin (Stroke Patients Only)       Balance Overall balance assessment: Needs assistance Sitting-balance support: No upper extremity supported;Feet supported Sitting balance-Leahy Scale: Good     Standing balance support: No upper extremity supported Standing  balance-Leahy Scale: Fair                              Cognition Arousal/Alertness: Awake/alert Behavior During Therapy: WFL for tasks assessed/performed Overall Cognitive Status: Within Functional Limits for tasks assessed                                        Exercises General Exercises - Lower Extremity Long Arc Quad: Strengthening;10 reps;Both;Seated    General Comments        Pertinent Vitals/Pain Pain Assessment: No/denies pain    Home Living                      Prior Function            PT Goals (current goals can now be found in the care plan section) Progress towards PT goals: Not progressing toward goals - comment    Frequency    Min 3X/week      PT Plan Discharge plan needs to be updated    Co-evaluation             End of Session Equipment Utilized During Treatment: Gait belt Activity Tolerance: Patient limited by fatigue Patient left: in chair;with call bell/phone within reach;with chair alarm set Nurse Communication: Mobility status PT Visit Diagnosis: Unsteadiness on feet (R26.81);Muscle weakness (generalized) (M62.81);Other abnormalities of gait and mobility (R26.89)     Time:  8006-3494 PT Time Calculation (min) (ACUTE ONLY): 16 min  Charges:  $Gait Training: 8-22 mins                    G Codes:       [facility] PT [place] 03/16/2017, 10:15 AM

## 2017-03-16 NOTE — NC FL2 (Signed)
[place] MEDICAID FL2 LEVEL OF CARE SCREENING TOOL     IDENTIFICATION  Patient Name: [name] Birthdate: 03-21-1920 Sex: male Admission Date (Current Location): 03/14/2017  [facility] and [place] Number:  Herbalist and Address:  [facility] [place], [place] [address] [place], [place] [address]      Provider Number: [id number]  Attending Physician Name and Address:  Lind Covert, MD  Relative Name and Phone Number:       Current Level of Care: Hospital Recommended Level of Care: [place] Prior Approval Number:    Date Approved/Denied:   PASRR Number: [id number]  Discharge Plan: SNF    Current Diagnoses: Patient Active Problem List   Diagnosis Date Noted  . Pressure injury of skin 03/15/2017  . SBO (small bowel obstruction) ([place]) 03/14/2017  . Essential hypertension   . Small bowel obstruction ([place])   . Second degree AV block 11/29/2014  . Abnormal esophagram 09/07/2014  . Pacemaker - Medtronic Adapta 2006, gen change 2015 07/23/2013  . Chronic right hip pain 09/22/2012  . Testicular swelling, right 09/22/2012  . Presbycusis 05/14/2012  . Long term current use of anticoagulant 01/30/2012  . Paroxysmal atrial fibrillation ([place]) 01/30/2012  . Hx SBO 01/30/2012  . S/P appendectomy 01/30/2012  . CHRONIC KIDNEY DISEASE STAGE III (MODERATE) 12/03/2010  . ALLERGIC RHINITIS 09/05/2009  . HOARSENESS 05/15/2009  . HIATAL HERNIA 12/14/2008  . Diverticulosis of large intestine 12/14/2008  . COLONIC POLYPS, HYPERPLASTIC, HX OF 12/14/2008  . ESOPHAGEAL STRICTURE 10/04/2008  . Other vitamin B12 deficiency anemia 01/26/2008  . GLAUCOMA 01/14/2007  . CATARACT 01/14/2007  . SICK SINUS SYNDROME 01/14/2007  . GASTROESOPHAGEAL REFLUX, NO ESOPHAGITIS 01/14/2007    Orientation RESPIRATION BLADDER Height & Weight     Self, Time, Situation, Place  Normal Continent Weight: 181 lb 14.4 oz (82.5 kg) Height:  5\' 6"  (167.6 cm)   BEHAVIORAL SYMPTOMS/MOOD NEUROLOGICAL BOWEL NUTRITION STATUS      Continent Diet (see DC summary)  AMBULATORY STATUS COMMUNICATION OF NEEDS Skin   Limited Assist Verbally PU Stage and Appropriate Care (stage 1 on sacrum- no dressing change)                       Personal Care Assistance Level of Assistance  Bathing, Dressing Bathing Assistance: Limited assistance   Dressing Assistance: Limited assistance     Functional Limitations Info             SPECIAL CARE FACTORS FREQUENCY  PT (By licensed PT), OT (By licensed OT)     PT Frequency: 5/wk OT Frequency: 5/wk            Contractures      Additional Factors Info  Code Status, Allergies Code Status Info: DNR Allergies Info: Codeine           Current Medications (03/16/2017):  This is the current hospital active medication list Current Facility-Administered Medications  Medication Dose Route Frequency Provider Last Rate Last Dose  . acidophilus (RISAQUAD) capsule 1 capsule  1 capsule Oral Daily Bufford Lope, DO   1 capsule at 03/16/17 0917  . dronedarone (MULTAQ) tablet 400 mg  400 mg Oral BID WC Bufford Lope, DO   400 mg at 03/16/17 0917  . nebivolol (BYSTOLIC) tablet 10 mg  10 mg Oral Daily Bufford Lope, DO   10 mg at 03/16/17 0347  . ondansetron (ZOFRAN-ODT) disintegrating tablet 4 mg  4 mg Oral  Q8H PRN Bufford Lope, DO      . pantoprazole (PROTONIX) EC tablet 40 mg  40 mg Oral QAC breakfast Bufford Lope, DO   40 mg at 03/16/17 0917  . polyethylene glycol (MIRALAX / GLYCOLAX) packet 17 g  17 g Oral BID Bufford Lope, DO   17 g at 03/16/17 0917  . psyllium (HYDROCIL/METAMUCIL) packet 1 packet  1 packet Oral Daily Bufford Lope, DO   1 packet at 03/16/17 [phone]  . ramipril (ALTACE) capsule 2.5 mg  2.5 mg Oral Daily Bufford Lope, DO   2.5 mg at 03/16/17 0917  . Rivaroxaban (XARELTO) tablet 15 mg  15 mg Oral Q supper Bufford Lope, DO   15 mg at 03/15/17 1724  . tamsulosin (FLOMAX) capsule 0.4 mg  0.4 mg Oral QPC supper  Bufford Lope, DO   0.4 mg at 03/15/17 1724  . traMADol (ULTRAM) tablet 50-100 mg  50-100 mg Oral Q12H PRN Bufford Lope, DO         Discharge Medications: Please see discharge summary for a list of discharge medications.  Relevant Imaging Results:  Relevant Lab Results:   Additional Information SS#: [id number]  Jorge Ny, LCSW

## 2017-03-17 NOTE — Consult Note (Signed)
           [facility] CM Primary Care Navigator  03/17/2017  AADIL SUR [birth year] [phone]    Went to see patient earlier in the roomto identify possible discharge needs but he was alreadydischarged per staff .  Patient was discharged home instead of SNF yesterday with home health services [facility]) and family's supervision per inpatient CM note.  Primary care provider's office called Ander Purpura) to notify of patient's discharge and need for post hospital follow-up and transition of care. Notified also of patient's health issues needing follow-up. Lauren reported that patient will be seen at the office by Dr. Rosalyn Gess for follow-up tomorrow (03/17/17). Made aware to refer patient to [facility] care management ifdeemed appropriatefor services.              For questions, please contact:  Dannielle Huh, BSN, RN- [facility] Primary Care Navigator  Telephone: [phone] [place]

## 2017-03-18 ENCOUNTER — Telehealth: Payer: Self-pay | Admitting: *Deleted

## 2017-03-18 ENCOUNTER — Inpatient Hospital Stay: Payer: Medicare Other | Admitting: Family Medicine

## 2017-03-18 NOTE — Telephone Encounter (Signed)
Merry Proud, Physical Therapist with [facility] called to request physical therapy twice a week for 2 weeks and once a week for 2 weeks.  Verbal order given for Dr. Nori Riis.  Hassell Done, Rosine Beat, RN

## 2017-03-18 NOTE — Telephone Encounter (Signed)
Patient cancelled today's HFU appt. and rescheduled for 04/15/17. Patient in need of repeat CBC. VO received from Dr. Nori Riis for CBC to be done by [facility] home health nurse. Spoke with Fritzi Mandes at [facility] [phone]) and gave VO with results to be relayed to Dr. Andria Frames. Patient currently only receiving PT from [facility]. VO given for [facility] Nurse eval and CBC. Hubbard Hartshorn, RN, BSN

## 2017-03-27 ENCOUNTER — Telehealth: Payer: Self-pay | Admitting: *Deleted

## 2017-03-27 NOTE — Telephone Encounter (Signed)
Sharee Pimple went out to visit patient today and his BP was 150/86 and patient said this is normal for him.  I informed her that he has had BP around that range before but also lower.  She would like for provider or CMA to call back with BP parameters for her to use.  Ok to [facility] on her cell.  Jazmin Hartsell,CMA

## 2017-03-30 NOTE — Telephone Encounter (Signed)
As long as he is not dizzy I am Ok with systolic in 893-810 range and diastolics 17-510. THANKS! [name]

## 2017-03-31 ENCOUNTER — Telehealth: Payer: Self-pay | Admitting: Cardiovascular Disease

## 2017-03-31 MED ORDER — DRONEDARONE HCL 400 MG PO TABS: 400.0000 mg | ORAL_TABLET | Freq: Two times a day (BID) | ORAL | 1 refills | Status: DC

## 2017-03-31 NOTE — Telephone Encounter (Signed)
Contacted [name] and gave her the below information. Katharina Caper, April D, [place]

## 2017-03-31 NOTE — Telephone Encounter (Signed)
New message    Pt daughter in law is calling.    *STAT* If patient is at the pharmacy, call can be transferred to refill team.   1. Which medications need to be refilled? (please list name of each medication and dose if known) multaq 400 mg  2. Which pharmacy/location (including street and city if local pharmacy) is medication to be sent to? [facility]  3. Do they need a 30 day or 90 day supply? 90 day

## 2017-03-31 NOTE — Telephone Encounter (Signed)
Refills sent to [facility] per pt request.

## 2017-04-15 ENCOUNTER — Ambulatory Visit (INDEPENDENT_AMBULATORY_CARE_PROVIDER_SITE_OTHER): Payer: Medicare Other | Admitting: Family Medicine

## 2017-04-15 ENCOUNTER — Encounter: Payer: Self-pay | Admitting: Family Medicine

## 2017-04-15 ENCOUNTER — Ambulatory Visit (INDEPENDENT_AMBULATORY_CARE_PROVIDER_SITE_OTHER): Payer: Medicare Other | Admitting: *Deleted

## 2017-04-15 VITALS — BP 140/74 | HR 87 | Temp 97.6°F | Ht 66.0 in | Wt 187.4 lb

## 2017-04-15 DIAGNOSIS — I10 Essential (primary) hypertension: Secondary | ICD-10-CM | POA: Diagnosis not present

## 2017-04-15 DIAGNOSIS — I495 Sick sinus syndrome: Secondary | ICD-10-CM | POA: Diagnosis not present

## 2017-04-15 DIAGNOSIS — D62 Acute posthemorrhagic anemia: Secondary | ICD-10-CM | POA: Diagnosis present

## 2017-04-15 DIAGNOSIS — E0822 Diabetes mellitus due to underlying condition with diabetic chronic kidney disease: Secondary | ICD-10-CM | POA: Diagnosis not present

## 2017-04-15 DIAGNOSIS — N183 Chronic kidney disease, stage 3 (moderate): Secondary | ICD-10-CM

## 2017-04-15 MED ORDER — GLUCOSE BLOOD VI STRP: ORAL_STRIP | 12 refills | Status: DC

## 2017-04-15 MED ORDER — NEBIVOLOL HCL 10 MG PO TABS: 10.0000 mg | ORAL_TABLET | Freq: Every day | ORAL | 3 refills | Status: DC

## 2017-04-15 MED ORDER — RAMIPRIL 2.5 MG PO CAPS: 2.5000 mg | ORAL_CAPSULE | Freq: Every day | ORAL | 3 refills | Status: DC

## 2017-04-15 MED ORDER — TORSEMIDE 10 MG PO TABS: 10.0000 mg | ORAL_TABLET | ORAL | 3 refills | Status: DC

## 2017-04-15 NOTE — Patient Instructions (Signed)
I will send  You a note about your blood work.  I have sent your Rx in  GREAT to se you!

## 2017-04-16 ENCOUNTER — Encounter: Payer: Self-pay | Admitting: Family Medicine

## 2017-04-16 LAB — CBC
Hematocrit: 36.2 % — ABNORMAL LOW (ref 37.5–51.0)
Hemoglobin: 11.8 g/dL — ABNORMAL LOW (ref 13.0–17.7)
MCH: 29.5 pg (ref 26.6–33.0)
MCHC: 32.6 g/dL (ref 31.5–35.7)
MCV: 91 fL (ref 79–97)
PLATELETS: 179 10*3/uL (ref 150–379)
RBC: 4 x10E6/uL — AB (ref 4.14–5.80)
RDW: 14.8 % (ref 12.3–15.4)
WBC: 5.9 10*3/uL (ref 3.4–10.8)

## 2017-04-16 NOTE — Progress Notes (Signed)
    CHIEF COMPLAINT / HPI:   f/u recent hospital stay for SBO. Had a slight decrease in hgb on last day--he wanted to follow up. He has been feeling well. No abdominal pain. Has some questions about medication dosing.He is having several bowel movements a day most days.   REVIEW OF SYSTEMS: Pertinent review of systems: negative for fever or unusual weight change. See HPI  OBJECTIVE:  Vital signs are reviewed.   Vital signs reviewed. GENERAL: Well-developed, well-nourished, no acute distress. CARDIOVASCULAR: Regular rate and rhythm no murmur gallop or rub LUNGS: Clear to auscultation bilaterally, no rales or wheeze. ABDOMEN: Soft positive bowel sounds NEURO: decreased hearing B.  MSK: Movement of extremity x 4. Can rise from a chair with some assistance from his walker and uses that to ambulate----moderately steady.    ASSESSMENT / PLAN: 1. SBO---seems to be doing well. Reviewed his fiber supplement. Reviewed red flags 2. Will check hbb today. Overall he looks great!

## 2017-04-17 ENCOUNTER — Encounter: Payer: Self-pay | Admitting: Family Medicine

## 2017-04-17 LAB — CUP PACEART REMOTE DEVICE CHECK
Battery Impedance: 232 Ohm
Battery Remaining Longevity: 99 mo
Brady Statistic AP VP Percent: 87 %
Brady Statistic AS VS Percent: 0 %
Date Time Interrogation Session: 20180530170236
Implantable Lead Implant Date: 20061128
Implantable Lead Location: 753859
Implantable Lead Model: 4592
Implantable Lead Model: 5092
Implantable Pulse Generator Implant Date: 20150428
Lead Channel Impedance Value: 508 Ohm
Lead Channel Impedance Value: 609 Ohm
Lead Channel Pacing Threshold Amplitude: 0.5 V
Lead Channel Setting Pacing Amplitude: 2.5 V
MDC IDC LEAD IMPLANT DT: 20061128
MDC IDC LEAD LOCATION: 753860
MDC IDC MSMT BATTERY VOLTAGE: 2.79 V
MDC IDC MSMT LEADCHNL RA PACING THRESHOLD PULSEWIDTH: 0.4 ms
MDC IDC MSMT LEADCHNL RV PACING THRESHOLD AMPLITUDE: 0.75 V
MDC IDC MSMT LEADCHNL RV PACING THRESHOLD PULSEWIDTH: 0.4 ms
MDC IDC SET LEADCHNL RA PACING AMPLITUDE: 2 V
MDC IDC SET LEADCHNL RV PACING PULSEWIDTH: 0.4 ms
MDC IDC SET LEADCHNL RV SENSING SENSITIVITY: 4 mV
MDC IDC STAT BRADY AP VS PERCENT: 0 %
MDC IDC STAT BRADY AS VP PERCENT: 13 %

## 2017-04-17 NOTE — Progress Notes (Signed)
Remote pacemaker transmission.   

## 2017-04-24 ENCOUNTER — Encounter: Payer: Self-pay | Admitting: Cardiology

## 2017-04-28 ENCOUNTER — Telehealth: Payer: Self-pay | Admitting: Family Medicine

## 2017-04-28 NOTE — Telephone Encounter (Signed)
Surgery authorization form dropped off for at front desk for completion.  Verified that patient section of form has been completed.  Last DOS/WCC with PCP was 04/15/17.  Placed form in team folder to be completed by clinical staff.  Crista Luria

## 2017-04-28 NOTE — Telephone Encounter (Signed)
[name] would like Dr. Nori Riis to call her regarding pt's upcoming hip replacement surgery. ep

## 2017-04-28 NOTE — Telephone Encounter (Signed)
Clinical info completed on surgery clearance form.  Place form in [name]'s box for completion.  Katharina Caper, April D, [place]

## 2017-04-29 NOTE — Telephone Encounter (Signed)
I spoke with [name] at length. He had a fall after chasing one of his callus. It didn't really bother him too much for a few days at the any be came difficult for him to stand or walk he saw an orthopedist he wants to do a hip replacement. He also tells patient that most likely he would be going home the day after the surgery. [name] who is his primary caretaker is quite concerned about this as he is essentially living alone right now with them checking on him several times a day.   They dropped off paperwork for me to fill out. I'm not sure he is a candidate for hip replacement. We walked about possibly getting a second opinion. Ongoing fill out the form so we don't delay anything that I'll call her back tomorrow 3 but had some time to consider it and decide whether not we want to pursue a second opinion.

## 2017-04-29 NOTE — Telephone Encounter (Signed)
[name] would like Dr. Nori Riis to call her cell phone [phone] ep

## 2017-04-30 ENCOUNTER — Encounter: Payer: Self-pay | Admitting: Family Medicine

## 2017-04-30 NOTE — Telephone Encounter (Signed)
Patient's daughter in law Kathlene Cote informed that form was faxed to [facility]. Form was copied for placing in patient's record. Original copy placed in outgoing mail to home address that's on file.  Derl Barrow, RN

## 2017-05-01 ENCOUNTER — Other Ambulatory Visit: Payer: Self-pay | Admitting: *Deleted

## 2017-05-01 MED ORDER — TRAMADOL HCL 50 MG PO TABS: ORAL_TABLET | ORAL | 3 refills | Status: DC

## 2017-05-04 ENCOUNTER — Encounter: Payer: Self-pay | Admitting: Family Medicine

## 2017-05-21 ENCOUNTER — Telehealth: Payer: Self-pay

## 2017-05-21 NOTE — Telephone Encounter (Signed)
Discussed by phone with [place]. Hip replacement surgery would be a big undertaking for [name], even with improved anterior approach technique. I agree with the reluctance expressed in Dr. Verlon Au clinic note and surgical risk letter.  There is no cardiac problem that would make [name]'s risk prohibitive, but I worry about his age, his deteriorating functional status, limited ability to rehab and risk of recurrent protracted SBO with anesthesia/immobilization/reduced PO intake of surgery. As far as I can tell, his hip pain is not as bad anymore. If possible, I would put off surgery unless absolutely necessary for quality of life.  Sanda Klein, MD, [facility] [phone] office [phone] pager

## 2017-05-21 NOTE — Telephone Encounter (Signed)
1. Type of surgery: R total hip replacement 2. Date of surgery: pending 3. Surgeon: Dr Aaron Edelman Sw[place] 4. Medications that need to be held & how long: N/A; pt takes Xarelto 15 mg QD per record 5. Fax and/or Phone: (p) [phone] (f) [phone]

## 2017-05-22 NOTE — Telephone Encounter (Signed)
Encounter faxed to Santiago Bur, surgical coordinator at [facility] via epic.

## 2017-06-10 ENCOUNTER — Other Ambulatory Visit: Payer: Self-pay | Admitting: *Deleted

## 2017-06-12 LAB — HM DIABETES EYE EXAM

## 2017-06-12 MED ORDER — RIVAROXABAN 15 MG PO TABS: 15.0000 mg | ORAL_TABLET | Freq: Every day | ORAL | 3 refills | Status: DC

## 2017-07-15 ENCOUNTER — Encounter: Payer: Medicare Other | Admitting: *Deleted

## 2017-07-17 ENCOUNTER — Encounter: Payer: Self-pay | Admitting: Cardiology

## 2017-07-22 ENCOUNTER — Encounter: Payer: Self-pay | Admitting: Cardiovascular Disease

## 2017-07-22 ENCOUNTER — Ambulatory Visit (INDEPENDENT_AMBULATORY_CARE_PROVIDER_SITE_OTHER): Payer: Medicare Other | Admitting: Cardiovascular Disease

## 2017-07-22 VITALS — BP 130/70 | HR 85 | Ht 66.0 in | Wt 182.0 lb

## 2017-07-22 DIAGNOSIS — Z950 Presence of cardiac pacemaker: Secondary | ICD-10-CM | POA: Diagnosis not present

## 2017-07-22 DIAGNOSIS — Z01810 Encounter for preprocedural cardiovascular examination: Secondary | ICD-10-CM | POA: Diagnosis not present

## 2017-07-22 DIAGNOSIS — I480 Paroxysmal atrial fibrillation: Secondary | ICD-10-CM

## 2017-07-22 DIAGNOSIS — Z7901 Long term (current) use of anticoagulants: Secondary | ICD-10-CM

## 2017-07-22 DIAGNOSIS — I441 Atrioventricular block, second degree: Secondary | ICD-10-CM

## 2017-07-22 DIAGNOSIS — I495 Sick sinus syndrome: Secondary | ICD-10-CM | POA: Diagnosis not present

## 2017-07-22 MED ORDER — DRONEDARONE HCL 400 MG PO TABS: 400.0000 mg | ORAL_TABLET | Freq: Two times a day (BID) | ORAL | 3 refills | Status: DC

## 2017-07-22 NOTE — Patient Instructions (Signed)
Dr [name] recommends that you continue on your current medications as directed. Please refer to the Current Medication list given to you today.  Remote monitoring is used to monitor your Pacemaker of ICD from home. This monitoring reduces the number of office visits required to check your device to one time per year. It allows [place] to keep an eye on the functioning of your device to ensure it is working properly. You are scheduled for a device check from home on Wednesday, December 5th, 2018. You may send your transmission at any time that day. If you have a wireless device, the transmission will be sent automatically. After your physician reviews your transmission, you will receive a postcard with your next transmission date.  Dr [name] recommends that you schedule a follow-up appointment in 6 months with a pacemaker check. You will receive a reminder letter in the mail two months in advance. If you don't receive a letter, please call our office to schedule the follow-up appointment.  If you need a refill on your cardiac medications before your next appointment, please call your pharmacy.

## 2017-07-22 NOTE — Progress Notes (Signed)
Patient ID: [name], male   DOB: Jun 13, 1920, 83 y.o.   MRN: [id number]     Cardiology Office Note    Date:  07/22/2017   ID:  [name], DOB 11-15-1920, MRN [id number]  PCP:  Dickie La, MD  Cardiologist:   Sanda Klein, MD   chief complaint: pacemaker check.   History of Present Illness:  [name] is a 83 y.o. male with paroxysmal atrial fibrillation, sinus node dysfunction, high-grade second-degree AV block, s/p dual chamber pacemaker (2006, generator change 2015). He takes Multaq for AF prevention, is on Xarelto for embolic stroke prevention. He has well controlled HTN and mild, diet controlled type 2 DM.  He recently had another fall, when he tried to take his walker across the yard. Thankfully he did not have any head impact or any other serious injuries. He had no bleeding problems. This is a second fall in the last year.  He has been reconsidering undergoing right total hip replacement with Dr. Lyla Glassing and regrets that he didn't do it earlier.  His caregiver, Asencion Partridge is very worried about his ability to continue living independently. [place] starting to realize this is well and he is now talking about moving into assisted living. They plan to look into the [facility] nursing facility.   Interrogation of his pacemaker shows normal device function. He has 89% atrial pacing and 100% ventricular pacing. No atrial fibrillation has been seen. He had 43 episodes of mode switch, all less than 1 minute in duration for an overall burden of less than 0.1% . No new episodes of ventricular tachycardia have been recorded.. Both  lead parameters are stable, Heart rate histogram is favorable, considering his activity level. Generator longevity is estimated at about another 8 years..  Past Medical History:  Diagnosis Date  . Acute gastritis without mention of hemorrhage   . Arthritis   . Atrial fibrillation ([place])   . Chronic kidney disease, stage III (moderate)   . Diverticulosis    . Diverticulosis of colon (without mention of hemorrhage)   . Environmental allergies    HISTORY OF  . Esophageal dysmotility   . Family history of malignant neoplasm of gastrointestinal tract   . GERD (gastroesophageal reflux disease)   . Gout   . Hiatal hernia   . History of skin cancer   . Hypertension   . Hypertrophy of prostate with urinary obstruction and other lower urinary tract symptoms (LUTS)   . Overactive bladder   . Personal history of colonic polyps 10/21/2006   hyperplastic   . Presence of permanent cardiac pacemaker 10/14/05   medtronic Adapta  . Second degree AV block 11/29/2014  . Sinus node dysfunction (HCC)   . Small bowel obstruction ([place])   . Stricture and stenosis of esophagus   . Type II or unspecified type diabetes mellitus without mention of complication, not stated as uncontrolled    pt states that he checks his blood sugar once weekly, is not on oral meds or insulin  . Unspecified cataract   . Vitamin B12 deficiency     Past Surgical History:  Procedure Laterality Date  . APPENDECTOMY    . CARDIOVERSION  10/01/2011   Procedure: CARDIOVERSION;  Surgeon: Dani Gobble Mayre Bury;  Location: [facility];  Service: Cardiovascular;  Laterality: N/A;  OP CARDIOVERSION  . CARDIOVERSION  11/02/12   successful  . ESOPHAGOGASTRODUODENOSCOPY (EGD) WITH PROPOFOL N/A 09/22/2014   Procedure: ESOPHAGOGASTRODUODENOSCOPY (EGD) WITH PROPOFOL;  Surgeon: Jerene Bears, MD;  Location:  [facility];  Service: Gastroenterology;  Laterality: N/A;  . EYE SURGERY Bilateral years ago   cataract lens replacment  . PACEMAKER INSERTION  replaced 2 months ago, total of 8 years with pacemaker  . PERMANENT PACEMAKER GENERATOR CHANGE N/A 03/14/2014   Procedure: PERMANENT PACEMAKER GENERATOR CHANGE;  Surgeon: Sanda Klein, MD; Medtronic Adapta L model number [place], serial number [id number]   . SAVORY DILATION N/A 09/22/2014   Procedure: SAVORY DILATION;  Surgeon: Jerene Bears, MD;  Location: [facility]  END[facility];  Service: Gastroenterology;  Laterality: N/A;  . TONSILLECTOMY    . TOTAL HIP ARTHROPLASTY Bilateral     Outpatient Medications Prior to Visit  Medication Sig Dispense Refill  . beta carotene w/minerals (OCUVITE) tablet Take 1 tablet by mouth daily.     . Blood Glucose Monitoring Suppl (ONE TOUCH ULTRA SYSTEM KIT) w/Device KIT 1 kit by Does not apply route once. 1 each 0  . glucose blood test strip Use as instructed 100 each 12  . Lancets MISC Use with glucometer as directed 100 each 12  . nebivolol (BYSTOLIC) 10 MG tablet Take 1 tablet (10 mg total) by mouth daily. 90 tablet 3  . pantoprazole (PROTONIX) 40 MG tablet Take 1 tablet (40 mg total) by mouth daily before breakfast. 90 tablet 3  . Polyethyl Glycol-Propyl Glycol (SYSTANE FREE OP) Place 1 drop into both eyes 2 (two) times daily as needed (dry eyes). For dry eyes     . polyethylene glycol (MIRALAX / GLYCOLAX) packet Take 17 g by mouth daily. (Patient taking differently: Take 17 g by mouth 2 (two) times daily. Mix with 8 oz juice and drink) 14 each 0  . Probiotic Product (PROBIOTIC PO) Take 1 tablet by mouth daily.    . psyllium (HYDROCIL/METAMUCIL) 95 % PACK Take 1 packet by mouth 2 (two) times daily. 56 each 2  . ramipril (ALTACE) 2.5 MG capsule Take 1 capsule (2.5 mg total) by mouth daily. 90 capsule 3  . Rivaroxaban (XARELTO) 15 MG TABS tablet Take 1 tablet (15 mg total) by mouth daily with supper. 90 tablet 3  . silodosin (RAPAFLO) 4 MG CAPS capsule Take 2 capsules (8 mg total) by mouth daily with breakfast. (Patient taking differently: Take 8 mg by mouth daily with supper. ) 135 capsule 3  . torsemide (DEMADEX) 10 MG tablet Take 1 tablet (10 mg total) by mouth 2 (two) times a week. Mondays and Fridays. 28 tablet 3  . traMADol (ULTRAM) 50 MG tablet Take by mouth 1-2 every 8-12 hours 180 tablet 3  . vitamin B-12 (CYANOCOBALAMIN) 500 MCG tablet Take 500 mcg by mouth daily.    Marland Kitchen dronedarone (MULTAQ) 400 MG tablet Take 1  tablet (400 mg total) by mouth 2 (two) times daily with a meal. 180 tablet 1   No facility-administered medications prior to visit.      Allergies:   Codeine   Social History   Social History  . Marital status: Widowed    Spouse name: N/A  . Number of children: N/A  . Years of education: N/A   Occupational History  . retired Retired   Social History Main Topics  . Smoking status: Former Smoker    Packs/day: 1.00    Years: 20.00    Types: Cigarettes    Quit date: 11/17/1969  . Smokeless tobacco: Never Used  . Alcohol use No     Comment: none since 1990  . Drug use: No  . Sexual activity: Not Asked  Other Topics Concern  . None   Social History Narrative   Step son is Clerance Lav; Jenny Reichmann and his wife Janean Sark are primary care takers. They live at [place] [address]     Family History:  The patient's family history includes Colon cancer (age of onset: 79) in his brother; Hodgkin's lymphoma in his grandchild; Hypertension in his brother and father; Parkinsonism in his mother.   ROS:   Please see the history of present illness.    ROS All other systems reviewed and are negative.   PHYSICAL EXAM:   VS:  BP 130/70   Pulse 85   Ht 5' 6" (1.676 m)   Wt 182 lb (82.6 kg)   BMI 29.38 kg/m     General: Alert, oriented x3, no distress. Overweight. Head: no evidence of trauma, PERRL, EOMI, no exophtalmos or lid lag, no myxedema, no xanthelasma; normal ears, nose and oropharynx Neck: normal jugular venous pulsations and no hepatojugular reflux; brisk carotid pulses without delay and no carotid bruits Chest: clear to auscultation, no signs of consolidation by percussion or palpation, normal fremitus, symmetrical and full respiratory excursions Cardiovascular: normal position and quality of the apical impulse, regular rhythm, normal first and paradoxically split second heart sound, no murmurs, rubs or gallops. , healthy left subclavian pacemaker  site. Abdomen: no tenderness or distention, no masses by palpation, no abnormal pulsatility or arterial bruits, normal bowel sounds, no hepatosplenomegaly Extremities: no clubbing, cyanosis or edema; 2+ radial, ulnar and brachial pulses bilaterally; 2+ right femoral, posterior tibial and dorsalis pedis pulses; 2+ left femoral, posterior tibial and dorsalis pedis pulses; no subclavian or femoral bruits Neurological: grossly nonfocal Psych: euthymic mood, full affect  Wt Readings from Last 3 Encounters:  07/22/17 182 lb (82.6 kg)  04/15/17 187 lb 6.4 oz (85 kg)  03/14/17 181 lb 14.4 oz (82.5 kg)      Studies/Labs Reviewed:   EKG:  EKG is ordered today.It shows 100% AV sequential pacing Recent Labs: 03/14/2017: ALT 27 03/16/2017: BUN 11; Creatinine, Ser 0.93; Potassium 4.4; Sodium 133 04/15/2017: Hemoglobin 11.8; Platelets 179   Lipid Panel    Component Value Date/Time   CHOL 152 07/05/2008 2038   TRIG 315 (H) 07/05/2008 2038   HDL 28 (L) 07/05/2008 2038   CHOLHDL 5.4 Ratio 07/05/2008 2038   VLDL 63 (H) 07/05/2008 2038   LDLCALC 61 07/05/2008 2038   LDLDIRECT 84 12/06/2014 1215     ASSESSMENT:    1. Paroxysmal atrial fibrillation (HCC)   2. Second degree AV block   3. Sinoatrial node dysfunction (HCC)   4. Long term current use of anticoagulant   5. Pacemaker - Medtronic Adapta 2006, gen change 2015   6. Preoperative cardiovascular examination      PLAN:  In order of problems listed above:  1. AFIb: Very rare episodes on Multaq. I don't think he's had any in well over a year. This medication has worked very well for him. Required DCCV in 2013.  CHADSVasc score 4 (age 15, HTN1, DM1).  2. 2nd deg AVB: Cause of high prevalence of ventricular pacing, but not pacemaker dependent. Does have underlying ventricular rhythm in the 30s and 40s. 3. SSS: Current sensor settings are appropriate or his rather sedentary lifestyle, no evidence to suggest chronotropic  incompetence 4. Anticoagulation: He has had some minor bleeding complications, but I am concerned about his increasing frequency of falls. There still appears to be overall benefit of anticoagulation, and I  think the solution is to get him in a more closely supervised environment. I encouraged him with his decision to look into assisted living. 5. PPM: Normal pacemaker function, continue remote downloads every 3 months and office visit in 6 months. 6. Preop evaluation: Rowyn is very elderly and has worsening stability problems, but from a cardiac point of view I think he could do well with orthopedic surgery, if he does decide to go ahead with the right hip surgery. He will need supervised rehabilitation I think.    Medication Adjustments/Labs and Tests Ordered: Current medicines are reviewed at length with the patient today.  Concerns regarding medicines are outlined above.  Medication changes, Labs and Tests ordered today are listed in the Patient Instructions below. Patient Instructions  Dr [name] recommends that you continue on your current medications as directed. Please refer to the Current Medication list given to you today.  Remote monitoring is used to monitor your Pacemaker of ICD from home. This monitoring reduces the number of office visits required to check your device to one time per year. It allows [place] to keep an eye on the functioning of your device to ensure it is working properly. You are scheduled for a device check from home on Wednesday, December 5th, 2018. You may send your transmission at any time that day. If you have a wireless device, the transmission will be sent automatically. After your physician reviews your transmission, you will receive a postcard with your next transmission date.  Dr [name] recommends that you schedule a follow-up appointment in 6 months with a pacemaker check. You will receive a reminder letter in the mail two months in advance. If you don't  receive a letter, please call our office to schedule the follow-up appointment.  If you need a refill on your cardiac medications before your next appointment, please call your pharmacy.    Signed, Sanda Klein, MD  07/22/2017 4:54 PM    [facility] [place], [place], [place]  [address] Phone: [phone]; Fax: [phone]

## 2017-07-23 ENCOUNTER — Telehealth: Payer: Self-pay | Admitting: Family Medicine

## 2017-07-23 NOTE — Telephone Encounter (Signed)
Would like to talk to dr neal about upcoming hip operation and the need for rehab. Please call bunny [phone]

## 2017-07-24 ENCOUNTER — Ambulatory Visit: Payer: Self-pay | Admitting: Orthopedic Surgery

## 2017-07-24 ENCOUNTER — Telehealth: Payer: Self-pay

## 2017-07-24 NOTE — Telephone Encounter (Signed)
1. Type of surgery: Right THA-DA 2. Date of surgery: 08/10/17 3. Surgeon: Dr Aaron Edelman Swinteck 4. Medications that need to be held & how long: Xarelto 20 mg 5. Fax and/or Phone: (p) [phone] (f) [phone]

## 2017-07-28 ENCOUNTER — Encounter: Payer: Self-pay | Admitting: Cardiovascular Disease

## 2017-07-28 ENCOUNTER — Ambulatory Visit: Payer: Self-pay | Admitting: Orthopedic Surgery

## 2017-07-28 ENCOUNTER — Other Ambulatory Visit: Payer: Self-pay | Admitting: Cardiovascular Disease

## 2017-07-28 NOTE — Telephone Encounter (Signed)
Epicd 

## 2017-07-28 NOTE — Telephone Encounter (Signed)
Faxed to Santiago Bur at [facility].

## 2017-07-28 NOTE — H&P (Signed)
TOTAL HIP ADMISSION H&P  Patient is admitted for right total hip arthroplasty.  Subjective:  Chief Complaint: right hip pain  HPI: LAQUAN BEIER, 83 y.o. male, has a history of pain and functional disability in the right hip(s) due to arthritis and patient has failed non-surgical conservative treatments for greater than 12 weeks to include NSAID's and/or analgesics, flexibility and strengthening excercises, supervised PT with diminished ADL's post treatment, use of assistive devices, weight reduction as appropriate and activity modification.  Onset of symptoms was gradual starting 2 years ago with gradually worsening course since that time.The patient noted no past surgery on the right hip(s).  Patient currently rates pain in the right hip at 10 out of 10 with activity. Patient has night pain, worsening of pain with activity and weight bearing, pain that interfers with activities of daily living, pain with passive range of motion and joint swelling. Patient has evidence of subchondral cysts, subchondral sclerosis, periarticular osteophytes and joint space narrowing by imaging studies. This condition presents safety issues increasing the risk of falls.  There is no current active infection.  Patient Active Problem List   Diagnosis Date Noted  . Normocytic anemia   . Pressure injury of skin 03/15/2017  . SBO (small bowel obstruction) ([place]) 03/14/2017  . Essential hypertension   . Small bowel obstruction ([place])   . Second degree AV block 11/29/2014  . Abnormal esophagram 09/07/2014  . Pacemaker - Medtronic Adapta 2006, gen change 2015 07/23/2013  . Chronic right hip pain 09/22/2012  . Testicular swelling, right 09/22/2012  . Presbycusis 05/14/2012  . Long term current use of anticoagulant 01/30/2012  . Paroxysmal atrial fibrillation ([place]) 01/30/2012  . Hx SBO 01/30/2012  . S/P appendectomy 01/30/2012  . CHRONIC KIDNEY DISEASE STAGE III (MODERATE) 12/03/2010  . ALLERGIC RHINITIS 09/05/2009   . HOARSENESS 05/15/2009  . HIATAL HERNIA 12/14/2008  . Diverticulosis of large intestine 12/14/2008  . COLONIC POLYPS, HYPERPLASTIC, HX OF 12/14/2008  . ESOPHAGEAL STRICTURE 10/04/2008  . Other vitamin B12 deficiency anemia 01/26/2008  . GLAUCOMA 01/14/2007  . CATARACT 01/14/2007  . SICK SINUS SYNDROME 01/14/2007  . GASTROESOPHAGEAL REFLUX, NO ESOPHAGITIS 01/14/2007   Past Medical History:  Diagnosis Date  . Acute gastritis without mention of hemorrhage   . Arthritis   . Atrial fibrillation ([place])   . Chronic kidney disease, stage III (moderate)   . Diverticulosis   . Diverticulosis of colon (without mention of hemorrhage)   . Environmental allergies    HISTORY OF  . Esophageal dysmotility   . Family history of malignant neoplasm of gastrointestinal tract   . GERD (gastroesophageal reflux disease)   . Gout   . Hiatal hernia   . History of skin cancer   . Hypertension   . Hypertrophy of prostate with urinary obstruction and other lower urinary tract symptoms (LUTS)   . Overactive bladder   . Personal history of colonic polyps 10/21/2006   hyperplastic   . Presence of permanent cardiac pacemaker 10/14/05   medtronic Adapta  . Second degree AV block 11/29/2014  . Sinus node dysfunction (HCC)   . Small bowel obstruction ([place])   . Stricture and stenosis of esophagus   . Type II or unspecified type diabetes mellitus without mention of complication, not stated as uncontrolled    pt states that he checks his blood sugar once weekly, is not on oral meds or insulin  . Unspecified cataract   . Vitamin B12 deficiency     Past Surgical History:  Procedure Laterality Date  . APPENDECTOMY    . CARDIOVERSION  10/01/2011   Procedure: CARDIOVERSION;  Surgeon: Dani Gobble [name];  [facility]: [facility];  Service: Cardiovascular;  Laterality: N/A;  OP CARDIOVERSION  . CARDIOVERSION  11/02/12   successful  . ESOPHAGOGASTRODUODENOSCOPY (EGD) WITH PROPOFOL N/A 09/22/2014   Procedure:  ESOPHAGOGASTRODUODENOSCOPY (EGD) WITH PROPOFOL;  Surgeon: Jerene Bears, MD;  [facility];  Service: Gastroenterology;  Laterality: N/A;  . EYE SURGERY Bilateral years ago   cataract lens replacment  . PACEMAKER INSERTION  replaced 2 months ago, total of 8 years with pacemaker  . PERMANENT PACEMAKER GENERATOR CHANGE N/A 03/14/2014   Procedure: PERMANENT PACEMAKER GENERATOR CHANGE;  Surgeon: Sanda Klein, MD; Medtronic Adapta L model number [place], serial number [id number]   . SAVORY DILATION N/A 09/22/2014   Procedure: SAVORY DILATION;  Surgeon: Jerene Bears, MD;  [facility];  Service: Gastroenterology;  Laterality: N/A;  . TONSILLECTOMY    . TOTAL HIP ARTHROPLASTY Bilateral      (Not in a hospital admission) Allergies  Allergen Reactions  . Codeine Nausea Only    Social History  Substance Use Topics  . Smoking status: Former Smoker    Packs/day: 1.00    Years: 20.00    Types: Cigarettes    Quit date: 11/17/1969  . Smokeless tobacco: Never Used  . Alcohol use No     Comment: none since 27    Family History  Problem Relation Age of Onset  . Parkinsonism Mother   . Hypertension Father   . Colon cancer Brother 60  . Hypertension Brother   . Hodgkin's lymphoma Grandchild      Review of Systems  Constitutional: Negative.   HENT: Negative.   Eyes: Negative.   Respiratory: Negative.   Cardiovascular: Positive for leg swelling.  Gastrointestinal: Negative.   Genitourinary: Positive for frequency.  Musculoskeletal: Positive for joint pain.  Skin: Negative.   Neurological: Negative.   Endo/Heme/Allergies: Negative.   Psychiatric/Behavioral: Negative.     Objective:  Physical Exam  Vitals reviewed. Constitutional: He is oriented to person, place, and time. He appears well-developed and well-nourished.  HENT:  Head: Normocephalic and atraumatic.  Eyes: Pupils are equal, round, and reactive to light. Conjunctivae and EOM are normal.  Neck: Normal  range of motion. Neck supple.  Cardiovascular: Normal rate, regular rhythm and intact distal pulses.   Respiratory: Effort normal. No respiratory distress.  GI: Soft. He exhibits no distension.  Genitourinary:  Genitourinary Comments: deferred  Musculoskeletal:       Right hip: He exhibits bony tenderness and crepitus.  Neurological: He is alert and oriented to person, place, and time. He has normal reflexes.  Skin: Skin is warm and dry.  Psychiatric: He has a normal mood and affect. His behavior is normal. Judgment and thought content normal.    Vital signs in last 24 hours: @VSRANGES @  Labs:   Estimated body mass index is 29.38 kg/m as calculated from the following:   Height as of 07/22/17: 5\' 6"  (1.676 m).   Weight as of 07/22/17: 82.6 kg (182 lb).   Imaging Review Plain radiographs demonstrate severe degenerative joint disease of the right hip(s). The bone quality appears to be adequate for age and reported activity level.  Assessment/Plan:  End stage arthritis, right hip(s)  The patient history, physical examination, clinical judgement of the provider and imaging studies are consistent with end stage degenerative joint disease of the right hip(s) and total hip arthroplasty is deemed medically necessary.  The treatment options including medical management, injection therapy, arthroscopy and arthroplasty were discussed at length. The risks and benefits of total hip arthroplasty were presented and reviewed. The risks due to aseptic loosening, infection, stiffness, dislocation/subluxation,  thromboembolic complications and other imponderables were discussed.  The patient acknowledged the explanation, agreed to proceed with the plan and consent was signed. Patient is being admitted for inpatient treatment for surgery, pain control, PT, OT, prophylactic antibiotics, VTE prophylaxis, progressive ambulation and ADL's and discharge planning.The patient is planning to be discharged to skilled  nursing facility. Dr. [name] requests admission to her team. Chronic xarelto.

## 2017-07-29 ENCOUNTER — Inpatient Hospital Stay (HOSPITAL_COMMUNITY): Admission: RE | Admit: 2017-07-29 | Payer: Medicare Other | Source: Ambulatory Visit

## 2017-07-30 NOTE — Telephone Encounter (Signed)
Left  mssg and my number on her voice mail [name]

## 2017-07-30 NOTE — Telephone Encounter (Signed)
Spoke w [name] He IS going to have surgery. We will be happy to take care of him on our hospital service once he has had surgery She will let me know when itis scheduled---sometime firstof October

## 2017-08-05 ENCOUNTER — Encounter: Payer: Self-pay | Admitting: Family Medicine

## 2017-08-10 ENCOUNTER — Encounter (HOSPITAL_COMMUNITY)
Admission: RE | Admit: 2017-08-10 | Discharge: 2017-08-10 | Disposition: A | Discharge status: Home / Self Care | Payer: Medicare Other | Source: Ambulatory Visit | Attending: Orthopedic Surgery | Admitting: Orthopedic Surgery

## 2017-08-10 ENCOUNTER — Encounter (HOSPITAL_COMMUNITY): Payer: Self-pay

## 2017-08-10 DIAGNOSIS — Z01818 Encounter for other preprocedural examination: Secondary | ICD-10-CM | POA: Diagnosis not present

## 2017-08-10 DIAGNOSIS — M1611 Unilateral primary osteoarthritis, right hip: Secondary | ICD-10-CM | POA: Diagnosis not present

## 2017-08-10 DIAGNOSIS — E119 Type 2 diabetes mellitus without complications: Secondary | ICD-10-CM | POA: Insufficient documentation

## 2017-08-10 HISTORY — DX: Malignant (primary) neoplasm, unspecified: C80.1

## 2017-08-10 HISTORY — DX: Constipation, unspecified: K59.00

## 2017-08-10 LAB — GLUCOSE, CAPILLARY: GLUCOSE-CAPILLARY: 110 mg/dL — AB (ref 65–99)

## 2017-08-10 LAB — BASIC METABOLIC PANEL
ANION GAP: 8 (ref 5–15)
BUN: 17 mg/dL (ref 6–20)
CALCIUM: 9.1 mg/dL (ref 8.9–10.3)
CO2: 27 mmol/L (ref 22–32)
Chloride: 97 mmol/L — ABNORMAL LOW (ref 101–111)
Creatinine, Ser: 1.1 mg/dL (ref 0.61–1.24)
GFR, EST NON AFRICAN AMERICAN: 55 mL/min — AB (ref >60)
Glucose, Bld: 108 mg/dL — ABNORMAL HIGH (ref 65–99)
Potassium: 4.7 mmol/L (ref 3.5–5.1)
Sodium: 132 mmol/L — ABNORMAL LOW (ref 135–145)

## 2017-08-10 LAB — CBC
HCT: 37.2 % — ABNORMAL LOW (ref 39.0–52.0)
HEMOGLOBIN: 12 g/dL — AB (ref 13.0–17.0)
MCH: 28.9 pg (ref 26.0–34.0)
MCHC: 32.3 g/dL (ref 30.0–36.0)
MCV: 89.6 fL (ref 78.0–100.0)
Platelets: 176 10*3/uL (ref 150–400)
RBC: 4.15 MIL/uL — AB (ref 4.22–5.81)
RDW: 15.9 % — ABNORMAL HIGH (ref 11.5–15.5)
WBC: 7.8 10*3/uL (ref 4.0–10.5)

## 2017-08-10 LAB — HEMOGLOBIN A1C
HEMOGLOBIN A1C: 5.8 % — AB (ref 4.8–5.6)
MEAN PLASMA GLUCOSE: 119.76 mg/dL

## 2017-08-10 LAB — SURGICAL PCR SCREEN
MRSA, PCR: NEGATIVE
STAPHYLOCOCCUS AUREUS: NEGATIVE

## 2017-08-10 LAB — ABO/RH: ABO/RH(D): O POS

## 2017-08-10 NOTE — Progress Notes (Signed)
Anesthesia PAT Evaluation: Patient is a 83 year old male scheduled for right THA anterior approach on 08/17/17 by Dr. Rod Can. Anesthesia is posted for spinal.   History includes afib s/p DCCV 10/01/11 and aborted DCCV 11/02/12 (spontaneous conversion to SR on Multaq), sinus node dysfunction/bradycardia with syncope s/p [employer] PPM (inserted 10/14/05; generator change 03/14/14), former smoker (quit '71), DM2 (diet controlled), HTN, CKD stage III, hiatal hernia, GERD, skin cancer, diverticulosis, BPH s/p TURP, gout, overactive bladder, SBO s/p LOA 10/08/05, tonsillectomy, appendectomy, left THA 07/21/07.   - PCP is Dr. [name]. She is aware of surgery plans. - Cardiologist is Dr. Sanda Klein, last visit 07/22/17. On device interrogation, he was 89% atrial pacing and 100% ventricular pacing. No afib or new episodes of VT recorded. He wrote, "Preop evaluation: [name] is very elderly and has worsening stability problems, but from a cardiac point of view I think he could do well with orthopedic surgery, if he does decide to go ahead with the right hip surgery. He will need supervised rehabilitation I think." - Urologist is with [facility].  Meds include Multaq, Bystolic, Protonix, ramipril, Xarelto (last dose to be 08/13/17), Rapaflo, torsemide, tramadol.   BP 140/70   Pulse 76   Temp 36.6 C   Resp 20   Ht 5\' 6"  (1.676 m)   Wt 178 lb 4.8 oz (80.9 kg)   SpO2 99%   BMI 28.78 kg/m  Patient presents with his daughter-in-law. Patient is in a hospital wheelchair. Reports he uses a walker at home and only a wheelchair with prolonged walking. He feels in his usual state of health except his fell on his right side a few weeks ago. In getting up he felt his aggravated his right shoulder arthritis. He can still move his shoulder, but feels sore. He has been sleeping in a recliner because his bed is high and was becoming more difficult to get into his bed. When in his bed, he normally sleeps on his  side for comfort, but feels he can lie flat if needed. He denied SOB, syncope. He has chronic BLE edema which he feels is stable. He has a left sided PPM. He reports 3-4 times per year (last several weeks ago) he has a sharp left chest pain that only lasts "a second." There is no associated nausea, diaphoresis, SOB. It goes a way on its own and is not associated with activity. He says he seldom gets these pains, but they have sporadically occurred now "for a while." He can't give me a real specific time frame other than to say that they have occurred for "over a year." He thinks they are "gas pains." They are not particularly bothersome to patient, and reportedly Dr. [name] was not concerned as well.  Heart RRR, no murmur noted. Lungs a little coarse at based but after cough, were clear. No carotid bruits auscultated. He has 1+ bilateral ankle edema. He wears complete upper dentures and has some missing lower teeth. He denied prior back surgery. Reported prior "spinal" for prostate surgery years ago.   EKG 07/22/17: AV dual paced rhythm.  Echo 01/24/11 (found under Notes tab): Conclusions: - LV: The cavity size is normal. There is moderate concentric hypertrophy. Systolic function was normal. The estimated EF 60-65%. Normal wall motion. No regional wall motion abnormalities. Stage II diastolic dysfunction. - Mitral valve: Mild regurgitation - LA: Moderate left atrial enlargement. - RV: Pacer wire or catheter noted in RV. - RA: Pacer wire or catheter noted  in RA.  - Pulmonary arteries: PA peak pressure 41 mmHg. - Pericardium: Trivial pericardial effusion identified posterior to the heart.  Nuclear stress test 03/04/10 [facility] now [facility]): Impression: The post stress myocardial perfusion images show normal pattern perfusion in all regions. The post stress LV is normal in size. There is no scintigraphic evidence of inducible myocardial ischemia. Post stress EF 81%. Global left ventricular  systolic function normal. No significant wall motion abnormalities. EKG is negative for ischemia. Normal myocardial perfusion imaging. Low risk scan.  CXR 03/14/17: IMPRESSION: No active cardiopulmonary disease.  Preoperative labs Glucose 108. Cr 1.10. H/H 12.0/37.2. PLT 176. A1c 5.8. He is for PT/INR on the day of surgery.   He reported being instructed to hold Xarelto 3 days (72 hours) before surgery. He is anticipating spinal anesthesia. He had recent cardiology evaluation with preoperative evaluation as above. He does not report any symptoms typical of angina. No SOB. His daughter-in-law will be with him on the day of surgery. He lives alone, but plans to go to [facility] facility following hospitalization. He is entertaining the idea of ultimately going to an assisted living facility. If no acute changes then I anticipate that he can proceed as planned.  [name] [phone] 08/10/2017 2:22 PM

## 2017-08-10 NOTE — Pre-Procedure Instructions (Signed)
ROMIR KLIMOWICZ  08/10/2017      [facility] [place] [place] to [address] [address] [place] [address] Phone: [phone] Fax: [place], [place] - [address] [place] [address] Phone: [phone] Fax: [phone]    Your procedure is scheduled on Monday October 1.  Report to [facility] Admitting at 9:45 A.M.  Call this number if you have problems the morning of surgery:  [phone]   Remember:  Do not eat food or drink liquids after midnight.  Take these medicines the morning of surgery with A SIP OF WATER: dronedarone (Multaq), nebivolol (Bystolic), pantoprazole (protonix), silodosin (rapaflo), tramadol if needed  7 days prior to surgery STOP taking any Aspirin, Aleve, Naproxen, Ibuprofen, Motrin, Advil, Goody's, BC's, all herbal medications, fish oil, and all vitamins  FOLLOW MD's instructions on stopping Xarelto (three days prior to surgery)     Do not wear jewelry, make-up or nail polish.  Do not wear lotions, powders, or perfumes, or deoderant.  Do not shave 48 hours prior to surgery.  Men may shave face and neck.  Do not bring valuables to the hospital.  [facility] is not responsible for any belongings or valuables.  Contacts, dentures or bridgework may not be worn into surgery.  Leave your suitcase in the car.  After surgery it may be brought to your room.  For patients admitted to the hospital, discharge time will be determined by your treatment team.  Patients discharged the day of surgery will not be allowed to drive home.    Special instructions:    [facility]- Preparing For Surgery  Before surgery, you can play an important role. Because skin is not sterile, your skin needs to be as free of germs as possible. You can reduce the number of germs on your skin by washing with  CHG (chlorahexidine gluconate) Soap before surgery.  CHG is an antiseptic cleaner which kills germs and bonds with the skin to continue killing germs even after washing.  Please do not use if you have an allergy to CHG or antibacterial soaps. If your skin becomes reddened/irritated stop using the CHG.  Do not shave (including legs and underarms) for at least 48 hours prior to first CHG shower. It is OK to shave your face.  Please follow these instructions carefully.   1. Shower the NIGHT BEFORE SURGERY and the MORNING OF SURGERY with CHG.   2. If you chose to wash your hair, wash your hair first as usual with your normal shampoo.  3. After you shampoo, rinse your hair and body thoroughly to remove the shampoo.  4. Use CHG as you would any other liquid soap. You can apply CHG directly to the skin and wash gently with a scrungie or a clean washcloth.   5. Apply the CHG Soap to your body ONLY FROM THE NECK DOWN.  Do not use on open wounds or open sores. Avoid contact with your eyes, ears, mouth and genitals (private parts). Wash genitals (private parts) with your normal soap.  6. Wash thoroughly, paying special attention to the area where your surgery will be performed.  7. Thoroughly rinse your body with warm water from the neck down.  8. DO NOT shower/wash with your normal soap after using and rinsing off the CHG Soap.  9. Pat yourself dry  with a CLEAN TOWEL.   10. Wear CLEAN PAJAMAS   11. Place CLEAN SHEETS on your bed the night of your first shower and DO NOT SLEEP WITH PETS.    Day of Surgery: Do not apply any deodorants/lotions. Please wear clean clothes to the hospital/surgery center.      Please read over the following fact sheets that you were given. Total Joint Packet and MRSA Information

## 2017-08-10 NOTE — Progress Notes (Signed)
PCP - [name] Cardiologist - [name]  Chest x-ray - 03/14/17 EKG - 07/22/17 ECHO- 2006  Pt reports CBG to be 90-110 when he checks it. Last HgbA1c a year ago. Will redraw today.   Pt states he has had chest pain in the last couple of weeks. Described as sharp pain to the left side of his chest pt states he has had this pain before and this is not new. Has had this off and on for years. Pt states he does often have gas pain. Pt states Dr. [name] is aware. [name] with anesthesia notified and to come speak with patient.   Patient denies shortness of breath, fever, cough and chest pain at PAT appointment  Patient verbalized understanding of instructions that were given to them at the PAT appointment. Patient was also instructed that they will need to review over the PAT instructions again at home before surgery.

## 2017-08-10 NOTE — Progress Notes (Signed)
   How to Manage Your Diabetes Before and After Surgery  Why is it important to control my blood sugar before and after surgery? . Improving blood sugar levels before and after surgery helps healing and can limit problems. . A way of improving blood sugar control is eating a healthy diet by: o  Eating less sugar and carbohydrates o  Increasing activity/exercise o  Talking with your doctor about reaching your blood sugar goals . High blood sugars (greater than 180 mg/dL) can raise your risk of infections and slow your recovery, so you will need to focus on controlling your diabetes during the weeks before surgery. . Make sure that the doctor who takes care of your diabetes knows about your planned surgery including the date and location.  How do I manage my blood sugar before surgery? . Check your blood sugar at least 4 times a day, starting 2 days before surgery, to make sure that the level is not too high or low. o Check your blood sugar the morning of your surgery when you wake up and every 2 hours until you get to the Short Stay unit. . If your blood sugar is less than 70 mg/dL, you will need to treat for low blood sugar: o Do not take insulin. o Treat a low blood sugar (less than 70 mg/dL) with  cup of clear juice (cranberry or apple), 4 glucose tablets, OR glucose gel. o Recheck blood sugar in 15 minutes after treatment (to make sure it is greater than 70 mg/dL). If your blood sugar is not greater than 70 mg/dL on recheck, call [phone] for further instructions. . Report your blood sugar to the short stay nurse when you get to Short Stay.  . If you are admitted to the hospital after surgery: o Your blood sugar will be checked by the staff and you will probably be given insulin after surgery (instead of oral diabetes medicines) to make sure you have good blood sugar levels. o The goal for blood sugar control after surgery is 80-180 mg/dL.           

## 2017-08-11 LAB — CUP PACEART INCLINIC DEVICE CHECK
Brady Statistic AP VP Percent: 89 %
Brady Statistic AP VS Percent: 0 %
Brady Statistic AS VS Percent: 0 %
Implantable Lead Implant Date: 20061128
Implantable Lead Implant Date: 20061128
Implantable Lead Location: 753859
Implantable Lead Model: 5092
Lead Channel Impedance Value: 492 Ohm
Lead Channel Impedance Value: 591 Ohm
Lead Channel Pacing Threshold Amplitude: 0.5 V
Lead Channel Pacing Threshold Amplitude: 0.625 V
Lead Channel Pacing Threshold Amplitude: 0.75 V
Lead Channel Pacing Threshold Pulse Width: 0.4 ms
Lead Channel Pacing Threshold Pulse Width: 0.4 ms
Lead Channel Pacing Threshold Pulse Width: 0.4 ms
Lead Channel Sensing Intrinsic Amplitude: 0.5 mV
Lead Channel Sensing Intrinsic Amplitude: 11.2 mV
Lead Channel Setting Pacing Amplitude: 2.5 V
Lead Channel Setting Pacing Pulse Width: 0.4 ms
MDC IDC LEAD LOCATION: 753860
MDC IDC MSMT BATTERY IMPEDANCE: 257 Ohm
MDC IDC MSMT BATTERY REMAINING LONGEVITY: 95 mo
MDC IDC MSMT BATTERY VOLTAGE: 2.79 V
MDC IDC MSMT LEADCHNL RA PACING THRESHOLD AMPLITUDE: 0.5 V
MDC IDC MSMT LEADCHNL RA PACING THRESHOLD PULSEWIDTH: 0.4 ms
MDC IDC PG IMPLANT DT: 20150428
MDC IDC SESS DTM: 20180905133312
MDC IDC SET LEADCHNL RA PACING AMPLITUDE: 2 V
MDC IDC SET LEADCHNL RV SENSING SENSITIVITY: 4 mV
MDC IDC STAT BRADY AS VP PERCENT: 11 %

## 2017-08-13 ENCOUNTER — Telehealth: Payer: Self-pay | Admitting: Family Medicine

## 2017-08-13 ENCOUNTER — Telehealth: Payer: Self-pay | Admitting: *Deleted

## 2017-08-13 NOTE — Telephone Encounter (Signed)
Wanted to add the contact number to previous message. Home [phone], Cell [phone]. Katharina Caper, April D, [place]

## 2017-08-13 NOTE — Telephone Encounter (Signed)
[name] is calling and would like to speak to Dr. Nori Riis and about [name]. jw

## 2017-08-13 NOTE — Telephone Encounter (Signed)
Agree with plan 

## 2017-08-13 NOTE — Telephone Encounter (Signed)
Contacted [name] to let her know that Dr. Nori Riis is out of town until Monday and she said she was just calling to let Dr. Nori Riis know that pt surgery is going to be on 08/17/2017 @9 :30 at [facility].  She stated that Dr. Nori Riis would be taking over pt care after surgery and was told to call 2 days prior.  [name] asked was there anything she needed to do on her part for rehab to be set up.  Consulted with preceptor Dr. Andria Frames and he said for her to let ortho know that they should consult with the [facility] Medicine Teaching Program after surgery is completed.  Told [name] that Dr. Nori Riis should be back next week and would be able to see this information. Routing to Dr. Andria Frames as well incase there is something else that should be done before then.  Katharina Caper, Courtenay Creger D, [place]

## 2017-08-14 MED ORDER — TRANEXAMIC ACID 1000 MG/10ML IV SOLN
1000.0000 mg | INTRAVENOUS | Status: DC
  Filled 2017-08-14: qty 10

## 2017-08-17 ENCOUNTER — Encounter (HOSPITAL_COMMUNITY): Payer: Self-pay | Admitting: *Deleted

## 2017-08-17 ENCOUNTER — Encounter (HOSPITAL_COMMUNITY)
Admission: RE | Disposition: A | Discharge status: Skilled Nursing Facility | Payer: Self-pay | Source: Ambulatory Visit | Attending: Family Medicine

## 2017-08-17 ENCOUNTER — Inpatient Hospital Stay (HOSPITAL_COMMUNITY): Payer: Medicare Other | Admitting: Certified Registered"

## 2017-08-17 ENCOUNTER — Inpatient Hospital Stay (HOSPITAL_COMMUNITY): Payer: Medicare Other | Admitting: Vascular Surgery

## 2017-08-17 ENCOUNTER — Inpatient Hospital Stay (HOSPITAL_COMMUNITY)
Admission: RE | Admit: 2017-08-17 | Discharge: 2017-08-20 | DRG: 470 | Disposition: A | Discharge status: Skilled Nursing Facility | Payer: Medicare Other | Source: Ambulatory Visit | Attending: Family Medicine | Admitting: Family Medicine

## 2017-08-17 ENCOUNTER — Inpatient Hospital Stay (HOSPITAL_COMMUNITY): Payer: Medicare Other

## 2017-08-17 DIAGNOSIS — N183 Chronic kidney disease, stage 3 (moderate): Secondary | ICD-10-CM | POA: Diagnosis present

## 2017-08-17 DIAGNOSIS — Z961 Presence of intraocular lens: Secondary | ICD-10-CM | POA: Diagnosis present

## 2017-08-17 DIAGNOSIS — K219 Gastro-esophageal reflux disease without esophagitis: Secondary | ICD-10-CM | POA: Diagnosis present

## 2017-08-17 DIAGNOSIS — Z79891 Long term (current) use of opiate analgesic: Secondary | ICD-10-CM | POA: Diagnosis not present

## 2017-08-17 DIAGNOSIS — Z7901 Long term (current) use of anticoagulants: Secondary | ICD-10-CM | POA: Diagnosis not present

## 2017-08-17 DIAGNOSIS — I480 Paroxysmal atrial fibrillation: Secondary | ICD-10-CM | POA: Diagnosis present

## 2017-08-17 DIAGNOSIS — I129 Hypertensive chronic kidney disease with stage 1 through stage 4 chronic kidney disease, or unspecified chronic kidney disease: Secondary | ICD-10-CM | POA: Diagnosis present

## 2017-08-17 DIAGNOSIS — D62 Acute posthemorrhagic anemia: Secondary | ICD-10-CM | POA: Diagnosis not present

## 2017-08-17 DIAGNOSIS — Z09 Encounter for follow-up examination after completed treatment for conditions other than malignant neoplasm: Secondary | ICD-10-CM | POA: Diagnosis not present

## 2017-08-17 DIAGNOSIS — Z8249 Family history of ischemic heart disease and other diseases of the circulatory system: Secondary | ICD-10-CM | POA: Diagnosis not present

## 2017-08-17 DIAGNOSIS — Z950 Presence of cardiac pacemaker: Secondary | ICD-10-CM

## 2017-08-17 DIAGNOSIS — N401 Enlarged prostate with lower urinary tract symptoms: Secondary | ICD-10-CM | POA: Diagnosis present

## 2017-08-17 DIAGNOSIS — K449 Diaphragmatic hernia without obstruction or gangrene: Secondary | ICD-10-CM | POA: Diagnosis present

## 2017-08-17 DIAGNOSIS — Z87891 Personal history of nicotine dependence: Secondary | ICD-10-CM

## 2017-08-17 DIAGNOSIS — I959 Hypotension, unspecified: Secondary | ICD-10-CM | POA: Diagnosis not present

## 2017-08-17 DIAGNOSIS — M109 Gout, unspecified: Secondary | ICD-10-CM | POA: Diagnosis present

## 2017-08-17 DIAGNOSIS — M25751 Osteophyte, right hip: Secondary | ICD-10-CM | POA: Diagnosis present

## 2017-08-17 DIAGNOSIS — Z885 Allergy status to narcotic agent status: Secondary | ICD-10-CM | POA: Diagnosis not present

## 2017-08-17 DIAGNOSIS — Z79899 Other long term (current) drug therapy: Secondary | ICD-10-CM

## 2017-08-17 DIAGNOSIS — Z86010 Personal history of colonic polyps: Secondary | ICD-10-CM | POA: Diagnosis not present

## 2017-08-17 DIAGNOSIS — E1122 Type 2 diabetes mellitus with diabetic chronic kidney disease: Secondary | ICD-10-CM | POA: Diagnosis present

## 2017-08-17 DIAGNOSIS — N138 Other obstructive and reflux uropathy: Secondary | ICD-10-CM | POA: Diagnosis present

## 2017-08-17 DIAGNOSIS — Z419 Encounter for procedure for purposes other than remedying health state, unspecified: Secondary | ICD-10-CM

## 2017-08-17 DIAGNOSIS — K224 Dyskinesia of esophagus: Secondary | ICD-10-CM | POA: Diagnosis present

## 2017-08-17 DIAGNOSIS — M1611 Unilateral primary osteoarthritis, right hip: Secondary | ICD-10-CM | POA: Diagnosis present

## 2017-08-17 DIAGNOSIS — Z96642 Presence of left artificial hip joint: Secondary | ICD-10-CM | POA: Diagnosis present

## 2017-08-17 HISTORY — PX: TOTAL HIP ARTHROPLASTY: SHX124

## 2017-08-17 LAB — GLUCOSE, CAPILLARY
GLUCOSE-CAPILLARY: 101 mg/dL — AB (ref 65–99)
GLUCOSE-CAPILLARY: 110 mg/dL — AB (ref 65–99)
Glucose-Capillary: 190 mg/dL — ABNORMAL HIGH (ref 65–99)

## 2017-08-17 LAB — PROTIME-INR
INR: 1.17
Prothrombin Time: 14.8 seconds (ref 11.4–15.2)

## 2017-08-17 SURGERY — ARTHROPLASTY, HIP, TOTAL, ANTERIOR APPROACH
Anesthesia: Spinal | Site: Hip | Laterality: Right

## 2017-08-17 MED ORDER — CHLORHEXIDINE GLUCONATE 4 % EX LIQD: 60.0000 mL | Freq: Once | CUTANEOUS | Status: DC

## 2017-08-17 MED ORDER — RIVAROXABAN 10 MG PO TABS
10.0000 mg | ORAL_TABLET | Freq: Every day | ORAL | Status: DC
  Filled 2017-08-17: qty 1

## 2017-08-17 MED ORDER — METOCLOPRAMIDE HCL 5 MG/ML IJ SOLN: 5.0000 mg | Freq: Three times a day (TID) | INTRAMUSCULAR | Status: DC | PRN

## 2017-08-17 MED ORDER — BUPIVACAINE-EPINEPHRINE (PF) 0.5% -1:200000 IJ SOLN
INTRAMUSCULAR | Status: DC | PRN
  Administered 2017-08-17: 30 mL

## 2017-08-17 MED ORDER — DRONEDARONE HCL 400 MG PO TABS
400.0000 mg | ORAL_TABLET | Freq: Two times a day (BID) | ORAL | Status: DC
  Administered 2017-08-17 – 2017-08-20 (×7): 400 mg via ORAL
  Filled 2017-08-17 (×8): qty 1

## 2017-08-17 MED ORDER — SODIUM CHLORIDE 0.9 % IJ SOLN
INTRAMUSCULAR | Status: DC | PRN
Start: 2017-08-17 — End: 2017-08-17
  Administered 2017-08-17: 30 mL via INTRAVENOUS

## 2017-08-17 MED ORDER — SODIUM CHLORIDE 0.9 % IV BOLUS (SEPSIS): 500.0000 mL | Freq: Once | INTRAVENOUS | Status: DC

## 2017-08-17 MED ORDER — PHENYLEPHRINE HCL 10 MG/ML IJ SOLN
INTRAMUSCULAR | Status: DC | PRN
  Administered 2017-08-17: 80 ug via INTRAVENOUS
  Administered 2017-08-17 (×2): 120 ug via INTRAVENOUS
  Administered 2017-08-17: 80 ug via INTRAVENOUS

## 2017-08-17 MED ORDER — HYDROMORPHONE HCL 1 MG/ML IJ SOLN: 0.5000 mg | INTRAMUSCULAR | Status: DC | PRN

## 2017-08-17 MED ORDER — SENNA 8.6 MG PO TABS
2.0000 | ORAL_TABLET | Freq: Every day | ORAL | Status: DC
  Administered 2017-08-17 – 2017-08-19 (×3): 17.2 mg via ORAL
  Filled 2017-08-17 (×3): qty 2

## 2017-08-17 MED ORDER — PSYLLIUM 95 % PO PACK
1.0000 | PACK | Freq: Two times a day (BID) | ORAL | Status: DC
  Administered 2017-08-18 – 2017-08-20 (×5): 1 via ORAL
  Filled 2017-08-17 (×7): qty 1

## 2017-08-17 MED ORDER — SODIUM CHLORIDE 0.9 % IV BOLUS (SEPSIS)
250.0000 mL | Freq: Once | INTRAVENOUS | Status: AC
  Administered 2017-08-17: 250 mL via INTRAVENOUS

## 2017-08-17 MED ORDER — BUPIVACAINE-EPINEPHRINE (PF) 0.5% -1:200000 IJ SOLN
INTRAMUSCULAR | Status: AC
  Filled 2017-08-17: qty 30

## 2017-08-17 MED ORDER — POLYETHYLENE GLYCOL 3350 17 G PO PACK
17.0000 g | PACK | Freq: Two times a day (BID) | ORAL | Status: DC
  Administered 2017-08-17 – 2017-08-20 (×4): 17 g via ORAL
  Filled 2017-08-17 (×5): qty 1

## 2017-08-17 MED ORDER — PHENOL 1.4 % MT LIQD: 1.0000 | OROMUCOSAL | Status: DC | PRN

## 2017-08-17 MED ORDER — TAMSULOSIN HCL 0.4 MG PO CAPS
0.4000 mg | ORAL_CAPSULE | Freq: Every day | ORAL | Status: DC
Start: 2017-08-18 — End: 2017-08-20
  Administered 2017-08-18 – 2017-08-20 (×3): 0.4 mg via ORAL
  Filled 2017-08-17 (×3): qty 1

## 2017-08-17 MED ORDER — KETOROLAC TROMETHAMINE 30 MG/ML IJ SOLN
INTRAMUSCULAR | Status: AC
  Filled 2017-08-17: qty 1

## 2017-08-17 MED ORDER — CEFAZOLIN SODIUM-DEXTROSE 2-4 GM/100ML-% IV SOLN
INTRAVENOUS | Status: AC
  Filled 2017-08-17: qty 100

## 2017-08-17 MED ORDER — ONDANSETRON HCL 4 MG/2ML IJ SOLN: 4.0000 mg | Freq: Once | INTRAMUSCULAR | Status: DC | PRN

## 2017-08-17 MED ORDER — ACETAMINOPHEN 325 MG PO TABS: 650.0000 mg | ORAL_TABLET | Freq: Four times a day (QID) | ORAL | Status: DC | PRN

## 2017-08-17 MED ORDER — ACETAMINOPHEN 10 MG/ML IV SOLN: 1000.0000 mg | Freq: Once | INTRAVENOUS | Status: DC | PRN

## 2017-08-17 MED ORDER — METHOCARBAMOL 1000 MG/10ML IJ SOLN
500.0000 mg | Freq: Four times a day (QID) | INTRAVENOUS | Status: DC | PRN
  Filled 2017-08-17: qty 5

## 2017-08-17 MED ORDER — ONDANSETRON HCL 4 MG/2ML IJ SOLN
INTRAMUSCULAR | Status: DC | PRN
  Administered 2017-08-17: 4 mg via INTRAVENOUS

## 2017-08-17 MED ORDER — METHOCARBAMOL 500 MG PO TABS: 500.0000 mg | ORAL_TABLET | Freq: Four times a day (QID) | ORAL | Status: DC | PRN

## 2017-08-17 MED ORDER — RAMIPRIL 2.5 MG PO CAPS
2.5000 mg | ORAL_CAPSULE | Freq: Every day | ORAL | Status: DC
  Administered 2017-08-17: 2.5 mg via ORAL
  Filled 2017-08-17: qty 1

## 2017-08-17 MED ORDER — CEFAZOLIN SODIUM-DEXTROSE 2-4 GM/100ML-% IV SOLN
2.0000 g | Freq: Four times a day (QID) | INTRAVENOUS | Status: AC
  Administered 2017-08-17 (×2): 2 g via INTRAVENOUS
  Filled 2017-08-17 (×2): qty 100

## 2017-08-17 MED ORDER — ACETAMINOPHEN 650 MG RE SUPP: 650.0000 mg | Freq: Four times a day (QID) | RECTAL | Status: DC | PRN

## 2017-08-17 MED ORDER — KETOROLAC TROMETHAMINE 30 MG/ML IJ SOLN
INTRAMUSCULAR | Status: DC | PRN
Start: 2017-08-17 — End: 2017-08-17
  Administered 2017-08-17: 30 mg via INTRAVENOUS

## 2017-08-17 MED ORDER — SODIUM CHLORIDE 0.9 % IR SOLN
Status: DC | PRN
Start: 2017-08-17 — End: 2017-08-17
  Administered 2017-08-17: 3000 mL

## 2017-08-17 MED ORDER — DOCUSATE SODIUM 100 MG PO CAPS
100.0000 mg | ORAL_CAPSULE | Freq: Two times a day (BID) | ORAL | Status: DC
  Administered 2017-08-17 – 2017-08-20 (×6): 100 mg via ORAL
  Filled 2017-08-17 (×6): qty 1

## 2017-08-17 MED ORDER — SODIUM CHLORIDE 0.9 % IV SOLN: INTRAVENOUS | Status: DC

## 2017-08-17 MED ORDER — TORSEMIDE 20 MG PO TABS
10.0000 mg | ORAL_TABLET | ORAL | Status: DC
  Administered 2017-08-17: 10 mg via ORAL
  Filled 2017-08-17: qty 1

## 2017-08-17 MED ORDER — FENTANYL CITRATE (PF) 100 MCG/2ML IJ SOLN: 25.0000 ug | INTRAMUSCULAR | Status: DC | PRN

## 2017-08-17 MED ORDER — NEBIVOLOL HCL 10 MG PO TABS
10.0000 mg | ORAL_TABLET | Freq: Every day | ORAL | Status: DC
  Administered 2017-08-18 – 2017-08-20 (×3): 10 mg via ORAL
  Filled 2017-08-17 (×3): qty 1

## 2017-08-17 MED ORDER — SODIUM CHLORIDE 0.9 % IV SOLN
INTRAVENOUS | Status: DC
  Administered 2017-08-17 – 2017-08-18 (×2): via INTRAVENOUS

## 2017-08-17 MED ORDER — SODIUM CHLORIDE 0.9 % IV SOLN
INTRAVENOUS | Status: DC | PRN
  Administered 2017-08-17: 1000 mg via INTRAVENOUS

## 2017-08-17 MED ORDER — SODIUM CHLORIDE 0.9 % IV BOLUS (SEPSIS)
500.0000 mL | Freq: Once | INTRAVENOUS | Status: AC
  Administered 2017-08-17: 500 mL via INTRAVENOUS

## 2017-08-17 MED ORDER — HYDROCODONE-ACETAMINOPHEN 5-325 MG PO TABS: 1.0000 | ORAL_TABLET | ORAL | Status: DC | PRN

## 2017-08-17 MED ORDER — FENTANYL CITRATE (PF) 250 MCG/5ML IJ SOLN
INTRAMUSCULAR | Status: DC | PRN
  Administered 2017-08-17: 50 ug via INTRAVENOUS

## 2017-08-17 MED ORDER — SODIUM CHLORIDE 0.9 % IV SOLN
INTRAVENOUS | Status: AC | PRN
  Administered 2017-08-17: 1000 mL via INTRAMUSCULAR

## 2017-08-17 MED ORDER — CEFAZOLIN SODIUM-DEXTROSE 2-4 GM/100ML-% IV SOLN
2.0000 g | INTRAVENOUS | Status: AC
  Administered 2017-08-17: 2 g via INTRAVENOUS

## 2017-08-17 MED ORDER — ONDANSETRON HCL 4 MG PO TABS: 4.0000 mg | ORAL_TABLET | Freq: Four times a day (QID) | ORAL | Status: DC | PRN

## 2017-08-17 MED ORDER — DEXAMETHASONE SODIUM PHOSPHATE 10 MG/ML IJ SOLN
10.0000 mg | Freq: Once | INTRAMUSCULAR | Status: AC
  Administered 2017-08-18: 10 mg via INTRAVENOUS
  Filled 2017-08-17: qty 1

## 2017-08-17 MED ORDER — LACTATED RINGERS IV SOLN
INTRAVENOUS | Status: DC
  Administered 2017-08-17 (×2): via INTRAVENOUS

## 2017-08-17 MED ORDER — PROPOFOL 500 MG/50ML IV EMUL
INTRAVENOUS | Status: DC | PRN
  Administered 2017-08-17: 30 ug/kg/min via INTRAVENOUS

## 2017-08-17 MED ORDER — METOCLOPRAMIDE HCL 5 MG PO TABS: 5.0000 mg | ORAL_TABLET | Freq: Three times a day (TID) | ORAL | Status: DC | PRN

## 2017-08-17 MED ORDER — POVIDONE-IODINE 10 % EX SWAB
2.0000 "application " | Freq: Once | CUTANEOUS | Status: AC
  Administered 2017-08-17: 2 via TOPICAL

## 2017-08-17 MED ORDER — ONDANSETRON HCL 4 MG/2ML IJ SOLN: 4.0000 mg | Freq: Four times a day (QID) | INTRAMUSCULAR | Status: DC | PRN

## 2017-08-17 MED ORDER — DEXAMETHASONE SODIUM PHOSPHATE 10 MG/ML IJ SOLN
INTRAMUSCULAR | Status: DC | PRN
  Administered 2017-08-17: 10 mg via INTRAVENOUS

## 2017-08-17 MED ORDER — INSULIN ASPART 100 UNIT/ML ~~LOC~~ SOLN
0.0000 [IU] | Freq: Three times a day (TID) | SUBCUTANEOUS | Status: DC
  Administered 2017-08-18: 3 [IU] via SUBCUTANEOUS
  Administered 2017-08-18 – 2017-08-20 (×2): 2 [IU] via SUBCUTANEOUS

## 2017-08-17 MED ORDER — DIPHENHYDRAMINE HCL 12.5 MG/5ML PO ELIX: 12.5000 mg | ORAL_SOLUTION | ORAL | Status: DC | PRN

## 2017-08-17 MED ORDER — MEPERIDINE HCL 25 MG/ML IJ SOLN: 6.2500 mg | INTRAMUSCULAR | Status: DC | PRN

## 2017-08-17 MED ORDER — PANTOPRAZOLE SODIUM 40 MG PO TBEC
40.0000 mg | DELAYED_RELEASE_TABLET | Freq: Every day | ORAL | Status: DC
  Administered 2017-08-18 – 2017-08-20 (×3): 40 mg via ORAL
  Filled 2017-08-17 (×3): qty 1

## 2017-08-17 MED ORDER — 0.9 % SODIUM CHLORIDE (POUR BTL) OPTIME
TOPICAL | Status: DC | PRN
Start: 2017-08-17 — End: 2017-08-17
  Administered 2017-08-17: 1000 mL

## 2017-08-17 MED ORDER — MENTHOL 3 MG MT LOZG: 1.0000 | LOZENGE | OROMUCOSAL | Status: DC | PRN

## 2017-08-17 MED ORDER — BUPIVACAINE IN DEXTROSE 0.75-8.25 % IT SOLN
INTRATHECAL | Status: DC | PRN
  Administered 2017-08-17: 1.6 mL via INTRATHECAL

## 2017-08-17 SURGICAL SUPPLY — 61 items
ADH SKN CLS APL DERMABOND .7 (GAUZE/BANDAGES/DRESSINGS) ×1
ALCOHOL ISOPROPYL (RUBBING) (MISCELLANEOUS) ×3 IMPLANT
BLADE CLIPPER SURG (BLADE) IMPLANT
CAPT HIP TOTAL 2 ×2 IMPLANT
CHLORAPREP W/TINT 26ML (MISCELLANEOUS) ×3 IMPLANT
COVER SURGICAL LIGHT HANDLE (MISCELLANEOUS) ×3 IMPLANT
DERMABOND ADVANCED (GAUZE/BANDAGES/DRESSINGS) ×2
DERMABOND ADVANCED .7 DNX12 (GAUZE/BANDAGES/DRESSINGS) ×2 IMPLANT
DRAPE C-ARM 42X72 X-RAY (DRAPES) ×3 IMPLANT
DRAPE STERI IOBAN 125X83 (DRAPES) ×3 IMPLANT
DRAPE U-SHAPE 47X51 STRL (DRAPES) ×9 IMPLANT
DRSG AQUACEL AG ADV 3.5X10 (GAUZE/BANDAGES/DRESSINGS) ×3 IMPLANT
ELECT BLADE 4.0 EZ CLEAN MEGAD (MISCELLANEOUS) ×3
ELECT REM PT RETURN 9FT ADLT (ELECTROSURGICAL) ×3
ELECTRODE BLDE 4.0 EZ CLN MEGD (MISCELLANEOUS) ×1 IMPLANT
ELECTRODE REM PT RTRN 9FT ADLT (ELECTROSURGICAL) ×1 IMPLANT
EVACUATOR 1/8 PVC DRAIN (DRAIN) IMPLANT
GLOVE BIO SURGEON STRL SZ8.5 (GLOVE) ×6 IMPLANT
GLOVE BIOGEL PI IND STRL 6.5 (GLOVE) IMPLANT
GLOVE BIOGEL PI IND STRL 7.0 (GLOVE) IMPLANT
GLOVE BIOGEL PI IND STRL 8 (GLOVE) IMPLANT
GLOVE BIOGEL PI IND STRL 8.5 (GLOVE) ×1 IMPLANT
GLOVE BIOGEL PI INDICATOR 6.5 (GLOVE) ×2
GLOVE BIOGEL PI INDICATOR 7.0 (GLOVE) ×6
GLOVE BIOGEL PI INDICATOR 8 (GLOVE) ×4
GLOVE BIOGEL PI INDICATOR 8.5 (GLOVE) ×2
GLOVE ECLIPSE 7.0 STRL STRAW (GLOVE) ×2 IMPLANT
GLOVE ECLIPSE 8.0 STRL XLNG CF (GLOVE) ×4 IMPLANT
GOWN STRL REUS W/ TWL XL LVL3 (GOWN DISPOSABLE) IMPLANT
GOWN STRL REUS W/TWL 2XL LVL3 (GOWN DISPOSABLE) ×5 IMPLANT
GOWN STRL REUS W/TWL XL LVL3 (GOWN DISPOSABLE) ×8 IMPLANT
HANDPIECE INTERPULSE COAX TIP (DISPOSABLE) ×3
HOOD PEEL AWAY FACE SHEILD DIS (HOOD) ×6 IMPLANT
KIT BASIN OR (CUSTOM PROCEDURE TRAY) ×3 IMPLANT
KIT ROOM TURNOVER OR (KITS) ×3 IMPLANT
MANIFOLD NEPTUNE II (INSTRUMENTS) ×3 IMPLANT
MARKER SKIN DUAL TIP RULER LAB (MISCELLANEOUS) ×6 IMPLANT
NDL SPNL 18GX3.5 QUINCKE PK (NEEDLE) ×1 IMPLANT
NEEDLE SPNL 18GX3.5 QUINCKE PK (NEEDLE) ×3 IMPLANT
NS IRRIG 1000ML POUR BTL (IV SOLUTION) ×3 IMPLANT
PACK TOTAL JOINT (CUSTOM PROCEDURE TRAY) ×3 IMPLANT
PACK UNIVERSAL I (CUSTOM PROCEDURE TRAY) ×3 IMPLANT
PAD ARMBOARD 7.5X6 YLW CONV (MISCELLANEOUS) ×6 IMPLANT
SAW OSC TIP CART 19.5X105X1.3 (SAW) ×3 IMPLANT
SEALER BIPOLAR AQUA 6.0 (INSTRUMENTS) ×2 IMPLANT
SET HNDPC FAN SPRY TIP SCT (DISPOSABLE) ×1 IMPLANT
SOL PREP POV-IOD 4OZ 10% (MISCELLANEOUS) ×3 IMPLANT
SUT ETHIBOND NAB CT1 #1 30IN (SUTURE) ×6 IMPLANT
SUT MNCRL AB 3-0 PS2 18 (SUTURE) ×3 IMPLANT
SUT MON AB 2-0 CT1 36 (SUTURE) ×5 IMPLANT
SUT VIC AB 1 CT1 27 (SUTURE) ×3
SUT VIC AB 1 CT1 27XBRD ANBCTR (SUTURE) ×1 IMPLANT
SUT VIC AB 2-0 CT1 27 (SUTURE) ×3
SUT VIC AB 2-0 CT1 TAPERPNT 27 (SUTURE) ×1 IMPLANT
SUT VLOC 180 0 24IN GS25 (SUTURE) ×3 IMPLANT
SYR 50ML LL SCALE MARK (SYRINGE) ×3 IMPLANT
TOWEL OR 17X24 6PK STRL BLUE (TOWEL DISPOSABLE) ×3 IMPLANT
TOWEL OR 17X26 10 PK STRL BLUE (TOWEL DISPOSABLE) ×3 IMPLANT
TRAY CATH 16FR W/PLASTIC CATH (SET/KITS/TRAYS/PACK) IMPLANT
TRAY FOLEY CATH SILVER 16FR (SET/KITS/TRAYS/PACK) IMPLANT
WATER STERILE IRR 1000ML POUR (IV SOLUTION) ×9 IMPLANT

## 2017-08-17 NOTE — Interval H&P Note (Signed)
History and Physical Interval Note:  08/17/2017 11:54 AM  [name]  has presented today for surgery, with the diagnosis of Degenerative joint disease right hip  The various methods of treatment have been discussed with the patient and family. After consideration of risks, benefits and other options for treatment, the patient has consented to  Procedure(s) with comments: RIGHT TOTAL HIP ARTHROPLASTY ANTERIOR APPROACH (Right) - Needs RNFA as a surgical intervention .  The patient's history has been reviewed, patient examined, no change in status, stable for surgery.  I have reviewed the patient's chart and labs.  Questions were answered to the patient's satisfaction.     Marty Uy, Horald Pollen

## 2017-08-17 NOTE — H&P (Signed)
[facility] Admission History and Physical Service Pager: [phone]  Patient name: [name] Medical record number: [id number] Date of birth: 12/22/1919 Age: 83 y.o. Gender: male  Primary Care Provider: Dickie La, MD Consultants: Orthopedics Code Status: full  Chief Complaint: Medical stabilization in the setting of recent right total hip arthroplasty  Assessment and Plan: [name] is a 83 y.o. male presenting with medical management for post-operative management for right total hip arthroplasty.   Total Hip arthroplasty Patient is POD#0 for total hip arthroplasty. Orthopedics following along, appreciate their recs. Per their recommendations patient can weight bare as tolerate beginning 10/2. Will plan for OT/PT and ambulation with walker. Given patient's age, pain control will be small barrier during admission. Will start on low dose of norco and tylenol for pain management. Will avoid nsaids as this can inhibit bone healing. Will encourage deep breathing as patient is at risk for decreased ambulation, Incentive spirometer to bedside. Pending PT/OT recommendations patient will likely be a rehab vs snf candidate given age and surgery. - Admit to inpatient family medicine, appropriate for floor, Dr. Nori Riis -Cefazolin 2 g 2 doses postop -Dilaudid 1 mg every 2 when necessary - - vitals signs per floor routine - WBAT with walker - consult PT/OT - Incentive spirometer, goal of multiple times per hours - cbc, bmp in am   Atrial fibrillation Recently seen by cardiology on 9/5 for these issues. Pacemaker interrogated at that appointment and showed normal device function. 89% atrial pacing and 100% ventricular pacing. No episodes of afib. CHADSVasc score of 4. Takes Xarelto 77m at home for stroke ppx. Managed with dronedarone 4031mtablet bid, and nebivolol 1013maily. Will resume betablocker and dronedarone at home dose. Will resume xarelto at 10 mg on  10/2. Will keep at that dose until 10/5 when can resume home dose of 69m21mily. - continue dronedarone 400mg90mlet bid - Continue nebivolol 10mg 72my - 10mg x88mto beginning 10/2, after 3 days can increase back to 15  Sinus node dysfunction/av block/Pacemaker Patient with prolonged QT/qtc interval on past ekgs. Would avoid antiemetics unless necessary. If needed would choose zofran as studies have shown zofran to only widen qt interval by 2-5%. Pacing would hopefully deter some if not all arrhythmias, but these drugs would best be avoided. - continue dronederone 400 mg twice a day - continue nebivolol 10 mgdaily - xarelto at 10mg fo73mdays - would avoid antiemetics if necessary - would choose Reglan or Compazine if  necessary  Recurrent Small Bowel Obstruction Patient with history of small bowel obstruction requires hospitalization. Will plan to take patient's diet very slowly as he is at risk for ileus development even without the added stress from a surgery. He is at lower risk due to no gi involvement with the surgery but he is still at high risk. Will plan to advance his diet very slowly. Will start with clear liquid diet. - Clear liquid diet, advance slowly as tolerated - miralax 17g bid - would hold off on anti-emetics unless needed  Hypertension Patient with hypertension at baseline. Takes nebivolol, ramipril, torsemide. Will plan to hold ramipril and torsemide during admission due to risk for dehydration and aki in post-operative patient. If patient becomes hypertensive overnight can start prn hydralazine. Pain control will also play some aspect in decreasing hypertension. - monitor blood pressure with vital signs - Restart nebivolol 10mg - h17mramipril 2.5mg - hol4morsemide  CKD Stage III Patient with history of  cr 1.35 and GFR 43 on bmp 5 months ago. At per-operative lab check on 9/24 patient with creatinine of 1.1 and GFR > 60 so this seems to have resolved. Will monitor  these values with daily bmp checks while admitted. Encourage PO intake, will keep on fluids until tolerating good PO. - daily bmp - hold ramipril, torsemide while inpatient  Type II Diabetes Patient with past diagnosis of T2DM.Last A1c 5.8. Does not take any oral medication or insulin. Will monitor sugars while inpatient as post-inflammatory state could cause hyperglycemia. If sugars consistently elevated will consider Sliding scale.  - Monitor CBGs   FEN/GI: NS @ 75 mL/hr, MiraLAX 17 g twice a day Prophylaxis: xarelto 61m  Disposition: home vs snf pending physical therapy evaluation and clinical improvement  History of Present Illness:  Samuel HAILEis a 83y.o. male presenting with a history of pain and functional disability in the right hip due to arthritis. Patient was originally managed conservatively with nsaids, exercise, and PT. He continued to have diminished ADL's post treatment, and required use of assistive devices, weight reduction and activity modification. His symptoms started around two years ago and gradually worsened since that time. The patient has never had surgery on his hip prior to 10/1. He was in 10/10 pain with any activity on that hip. Image studies consistent with osteoarthritis with osteophytes, joint space narrowing, and subchondral sclerosis. Patient underwent total hip arthroplasty of right hip on 10/1. Family medicine asked to admit patient for medical management while he recovers post-operatively. Patient with PMH of HTN, T2DM, diverticulosis, CKD stage III, Arthritis, and afib.     Review Of Systems: Per HPI with the following additions:  Review of Systems  Constitutional: Negative for chills and fever.  Eyes: Negative for pain, discharge and redness.  Respiratory: Negative for cough and hemoptysis.   Cardiovascular: Negative for chest pain, palpitations and orthopnea.  Gastrointestinal: Negative for constipation, diarrhea, nausea and vomiting.   Genitourinary: Negative for dysuria and urgency.  Musculoskeletal: Positive for joint pain.  Skin: Negative for itching and rash.  Neurological: Negative for dizziness and headaches.  Psychiatric/Behavioral: Negative for depression and suicidal ideas.    Past Medical History: Past Medical History:  Diagnosis Date  . Arthritis   . [place]   . Chronic kidney disease, stage III (moderate) (HCC)   . Constipation   . Diverticulosis of colon (without mention of hemorrhage)   . Esophageal dysmotility   . GERD (gastroesophageal reflux disease)   . Gout   . Hiatal hernia   . Hypertension   . Hypertrophy of prostate with urinary obstruction and other lower urinary tract symptoms (LUTS)   . Overactive bladder   . Presence of permanent cardiac pacemaker 10/14/05   medtronic Adapta  . Second degree AV block 11/29/2014  . Sinus node dysfunction (HCC)   . Small bowel obstruction ([place]   . Stricture and stenosis of esophagus   . Type II or unspecified type diabetes mellitus without mention of complication, not stated as uncontrolled    pt states that he checks his blood sugar once weekly, is not on oral meds or insulin  . Unspecified cataract   . Vitamin B12 deficiency     Past Surgical History: Past Surgical History:  Procedure Laterality Date  . APPENDECTOMY    . CARDIOVERSION  10/01/2011   Procedure: CARDIOVERSION;  Surgeon: MDani GobbleCroitoru;  Location: [facility] OR;  Service: Cardiovascular;  Laterality: N/A;  OP CARDIOVERSION  . CARDIOVERSION  11/02/12   successful  . ESOPHAGOGASTRODUODENOSCOPY (EGD) WITH PROPOFOL N/A 09/22/2014   Procedure: ESOPHAGOGASTRODUODENOSCOPY (EGD) WITH PROPOFOL;  Surgeon: Jerene Bears, MD;  Location: [facility] END[facility]COPY;  Service: Gastroenterology;  Laterality: N/A;  . EYE SURGERY Bilateral years ago   cataract lens replacment  . LAPAROSCOPY     for bowel obstruction  . PACEMAKER INSERTION  replaced 2 months ago, total of 8 years with pacemaker  .  PERMANENT PACEMAKER GENERATOR CHANGE N/A 03/14/2014   Procedure: PERMANENT PACEMAKER GENERATOR CHANGE;  Surgeon: Sanda Klein, MD; Medtronic Adapta L model number [place], serial number [id number]   . PROSTATE SURGERY    . SAVORY DILATION N/A 09/22/2014   Procedure: SAVORY DILATION;  Surgeon: Jerene Bears, MD;  Location: [facility];  Service: Gastroenterology;  Laterality: N/A;  . TONSILLECTOMY    . TOTAL HIP ARTHROPLASTY Left     Social History: Social History  Substance Use Topics  . Smoking status: Former Smoker    Packs/day: 1.00    Years: 20.00    Types: Cigarettes    Quit date: 11/17/1969  . Smokeless tobacco: Never Used  . Alcohol use No     Comment: none since 69    Family History: Family History  Problem Relation Age of Onset  . Parkinsonism Mother   . Hypertension Father   . Colon cancer Brother 50  . Hypertension Brother   . Hodgkin's lymphoma Grandchild     Allergies and Medications: Allergies  Allergen Reactions  . Codeine Nausea Only   No current facility-administered medications on file prior to encounter.    Current Outpatient Prescriptions on File Prior to Encounter  Medication Sig Dispense Refill  . beta carotene w/minerals (OCUVITE) tablet Take 1 tablet by mouth daily.     Marland Kitchen dronedarone (MULTAQ) 400 MG tablet Take 1 tablet (400 mg total) by mouth 2 (two) times daily with a meal. 180 tablet 3  . nebivolol (BYSTOLIC) 10 MG tablet Take 1 tablet (10 mg total) by mouth daily. 90 tablet 3  . pantoprazole (PROTONIX) 40 MG tablet Take 1 tablet (40 mg total) by mouth daily before breakfast. 90 tablet 3  . Polyethyl Glycol-Propyl Glycol (SYSTANE FREE OP) Place 1 drop into both eyes 2 (two) times daily as needed (dry eyes). For dry eyes     . polyethylene glycol (MIRALAX / GLYCOLAX) packet Take 17 g by mouth daily. (Patient taking differently: Take 17 g by mouth 2 (two) times daily. Mix with 8 oz juice and drink) 14 each 0  . Probiotic Product (PROBIOTIC PO)  Take 1 tablet by mouth daily.    . psyllium (HYDROCIL/METAMUCIL) 95 % PACK Take 1 packet by mouth 2 (two) times daily. 56 each 2  . ramipril (ALTACE) 2.5 MG capsule Take 1 capsule (2.5 mg total) by mouth daily. 90 capsule 3  . Rivaroxaban (XARELTO) 15 MG TABS tablet Take 1 tablet (15 mg total) by mouth daily with supper. 90 tablet 3  . silodosin (RAPAFLO) 4 MG CAPS capsule Take 2 capsules (8 mg total) by mouth daily with breakfast. (Patient taking differently: Take 8 mg by mouth daily with supper. ) 135 capsule 3  . torsemide (DEMADEX) 10 MG tablet Take 1 tablet (10 mg total) by mouth 2 (two) times a week. Mondays and Fridays. 28 tablet 3  . traMADol (ULTRAM) 50 MG tablet Take by mouth 1-2 every 8-12 hours (Patient taking differently: Take 100 mg by mouth 2 (two) times daily. ) 180 tablet 3  . vitamin  B-12 (CYANOCOBALAMIN) 500 MCG tablet Take 500 mcg by mouth daily.    . Blood Glucose Monitoring Suppl (ONE TOUCH ULTRA SYSTEM KIT) w/Device KIT 1 kit by Does not apply route once. 1 each 0  . glucose blood test strip Use as instructed 100 each 12  . Lancets MISC Use with glucometer as directed 100 each 12    Objective: BP 116/66   Pulse 71   Temp (!) 97 F (36.1 C)   Resp 12   Wt 178 lb (80.7 kg)   SpO2 100%   BMI 28.73 kg/m   Physical Exam: Ortho Exam General: Elderly gentleman,NAD, minimally responsive Cardiac: RRR, normal heart sounds, no murmurs. 2+ radial and PT pulses bilaterally Respiratory: CTAB, normal effort, No wheezes, rales or rhonchi Abdomen: soft, nontender, nondistended, no hepatic or splenomegaly, +BS Extremities: Right hip incision without any erythema, drainage or bleeding. Left hip is normal  Skin: warm and dry, no rashes noted Neuro: alert and oriented x4, no focal deficits Psych: Normal affect and mood  Labs and Imaging: CBC BMET  No results for input(s): WBC, HGB, HCT, PLT in the last 168 hours. No results for input(s): NA, K, CL, CO2, BUN, CREATININE,  GLUCOSE, CALCIUM in the last 168 hours.    I have seen and evaluated the patient with Dr. Kris Mouton. I am in agreement with the note above in its revised form. My additions are in blue.  Marjie Skiff, MD Family Medicine, PGY-2  Guadalupe Dawn, MD 08/17/2017, 4:32 PM PGY-1, [place] [facility] pager: [phone], text pages welcome

## 2017-08-17 NOTE — Transfer of Care (Signed)
Immediate Anesthesia Transfer of Care Note  Patient: [name]  Procedure(s) Performed: RIGHT TOTAL HIP ARTHROPLASTY ANTERIOR APPROACH (Right Hip)  Patient Location: PACU  Anesthesia Type:MAC and Spinal  Level of Consciousness: awake and patient cooperative  Airway & Oxygen Therapy: Patient Spontanous Breathing  Post-op Assessment: Report given to RN and Post -op Vital signs reviewed and stable  Post vital signs: Reviewed and stable  Last Vitals:  Vitals:   08/17/17 0939  BP: (!) 168/75  Pulse: 82  Resp: 18  SpO2: 98%    Last Pain: There were no vitals filed for this visit.    Patients Stated Pain Goal: 4 (64/68/03 2122)  Complications: No apparent anesthesia complications

## 2017-08-17 NOTE — Anesthesia Procedure Notes (Signed)
Spinal  Start time: 08/17/2017 12:16 PM End time: 08/17/2017 12:20 PM Staffing Anesthesiologist: Lyn Hollingshead Performed: anesthesiologist  Preanesthetic Checklist Completed: patient identified, surgical consent, pre-op evaluation, timeout performed, IV checked, risks and benefits discussed and monitors and equipment checked Spinal Block Patient position: sitting Prep: site prepped and draped and DuraPrep Patient monitoring: heart rate, cardiac monitor, continuous pulse ox and blood pressure Approach: midline Location: L3-4 Injection technique: single-shot Needle Needle type: Pencan  Needle gauge: 24 G Needle insertion depth: 5 cm Assessment Sensory level: T8

## 2017-08-17 NOTE — H&P (View-Only) (Signed)
Patient ID: [name], male   DOB: May 08, 1920, 83 y.o.   MRN: [id number]     Cardiology Office Note    Date:  07/22/2017   ID:  CHRISANGEL ESKENAZI, DOB 1920/07/09, MRN [id number]  PCP:  Dickie La, MD  Cardiologist:   Sanda Klein, MD   chief complaint: pacemaker check.   History of Present Illness:  [name] is a 83 y.o. male with paroxysmal atrial fibrillation, sinus node dysfunction, high-grade second-degree AV block, s/p dual chamber pacemaker (2006, generator change 2015). He takes Multaq for AF prevention, is on Xarelto for embolic stroke prevention. He has well controlled HTN and mild, diet controlled type 2 DM.  He recently had another fall, when he tried to take his walker across the yard. Thankfully he did not have any head impact or any other serious injuries. He had no bleeding problems. This is a second fall in the last year.  He has been reconsidering undergoing right total hip replacement with Dr. Lyla Glassing and regrets that he didn't do it earlier.  His caregiver, Asencion Partridge is very worried about his ability to continue living independently. [place] starting to realize this is well and he is now talking about moving into assisted living. They plan to look into the [facility] nursing facility.   Interrogation of his pacemaker shows normal device function. He has 89% atrial pacing and 100% ventricular pacing. No atrial fibrillation has been seen. He had 43 episodes of mode switch, all less than 1 minute in duration for an overall burden of less than 0.1% . No new episodes of ventricular tachycardia have been recorded.. Both  lead parameters are stable, Heart rate histogram is favorable, considering his activity level. Generator longevity is estimated at about another 8 years..  Past Medical History:  Diagnosis Date  . Acute gastritis without mention of hemorrhage   . Arthritis   . Atrial fibrillation ([place])   . Chronic kidney disease, stage III (moderate)   . Diverticulosis    . Diverticulosis of colon (without mention of hemorrhage)   . Environmental allergies    HISTORY OF  . Esophageal dysmotility   . Family history of malignant neoplasm of gastrointestinal tract   . GERD (gastroesophageal reflux disease)   . Gout   . Hiatal hernia   . History of skin cancer   . Hypertension   . Hypertrophy of prostate with urinary obstruction and other lower urinary tract symptoms (LUTS)   . Overactive bladder   . Personal history of colonic polyps 10/21/2006   hyperplastic   . Presence of permanent cardiac pacemaker 10/14/05   medtronic Adapta  . Second degree AV block 11/29/2014  . Sinus node dysfunction (HCC)   . Small bowel obstruction ([place])   . Stricture and stenosis of esophagus   . Type II or unspecified type diabetes mellitus without mention of complication, not stated as uncontrolled    pt states that he checks his blood sugar once weekly, is not on oral meds or insulin  . Unspecified cataract   . Vitamin B12 deficiency     Past Surgical History:  Procedure Laterality Date  . APPENDECTOMY    . CARDIOVERSION  10/01/2011   Procedure: CARDIOVERSION;  Surgeon: Dani Gobble Vern Prestia;  Location: [facility] OR;  Service: Cardiovascular;  Laterality: N/A;  OP CARDIOVERSION  . CARDIOVERSION  11/02/12   successful  . ESOPHAGOGASTRODUODENOSCOPY (EGD) WITH PROPOFOL N/A 09/22/2014   Procedure: ESOPHAGOGASTRODUODENOSCOPY (EGD) WITH PROPOFOL;  Surgeon: Jerene Bears, MD;  Location:  [facility] ENDOSCOPY;  Service: Gastroenterology;  Laterality: N/A;  . EYE SURGERY Bilateral years ago   cataract lens replacment  . PACEMAKER INSERTION  replaced 2 months ago, total of 8 years with pacemaker  . PERMANENT PACEMAKER GENERATOR CHANGE N/A 03/14/2014   Procedure: PERMANENT PACEMAKER GENERATOR CHANGE;  Surgeon: Sanda Klein, MD; Medtronic Adapta L model number [place], serial number [id number]   . SAVORY DILATION N/A 09/22/2014   Procedure: SAVORY DILATION;  Surgeon: Jerene Bears, MD;  Location: [facility]  ENDOSCOPY;  Service: Gastroenterology;  Laterality: N/A;  . TONSILLECTOMY    . TOTAL HIP ARTHROPLASTY Bilateral     Outpatient Medications Prior to Visit  Medication Sig Dispense Refill  . beta carotene w/minerals (OCUVITE) tablet Take 1 tablet by mouth daily.     . Blood Glucose Monitoring Suppl (ONE TOUCH ULTRA SYSTEM KIT) w/Device KIT 1 kit by Does not apply route once. 1 each 0  . glucose blood test strip Use as instructed 100 each 12  . Lancets MISC Use with glucometer as directed 100 each 12  . nebivolol (BYSTOLIC) 10 MG tablet Take 1 tablet (10 mg total) by mouth daily. 90 tablet 3  . pantoprazole (PROTONIX) 40 MG tablet Take 1 tablet (40 mg total) by mouth daily before breakfast. 90 tablet 3  . Polyethyl Glycol-Propyl Glycol (SYSTANE FREE OP) Place 1 drop into both eyes 2 (two) times daily as needed (dry eyes). For dry eyes     . polyethylene glycol (MIRALAX / GLYCOLAX) packet Take 17 g by mouth daily. (Patient taking differently: Take 17 g by mouth 2 (two) times daily. Mix with 8 oz juice and drink) 14 each 0  . Probiotic Product (PROBIOTIC PO) Take 1 tablet by mouth daily.    . psyllium (HYDROCIL/METAMUCIL) 95 % PACK Take 1 packet by mouth 2 (two) times daily. 56 each 2  . ramipril (ALTACE) 2.5 MG capsule Take 1 capsule (2.5 mg total) by mouth daily. 90 capsule 3  . Rivaroxaban (XARELTO) 15 MG TABS tablet Take 1 tablet (15 mg total) by mouth daily with supper. 90 tablet 3  . silodosin (RAPAFLO) 4 MG CAPS capsule Take 2 capsules (8 mg total) by mouth daily with breakfast. (Patient taking differently: Take 8 mg by mouth daily with supper. ) 135 capsule 3  . torsemide (DEMADEX) 10 MG tablet Take 1 tablet (10 mg total) by mouth 2 (two) times a week. Mondays and Fridays. 28 tablet 3  . traMADol (ULTRAM) 50 MG tablet Take by mouth 1-2 every 8-12 hours 180 tablet 3  . vitamin B-12 (CYANOCOBALAMIN) 500 MCG tablet Take 500 mcg by mouth daily.    Marland Kitchen dronedarone (MULTAQ) 400 MG tablet Take 1  tablet (400 mg total) by mouth 2 (two) times daily with a meal. 180 tablet 1   No facility-administered medications prior to visit.      Allergies:   Codeine   Social History   Social History  . Marital status: Widowed    Spouse name: N/A  . Number of children: N/A  . Years of education: N/A   Occupational History  . retired Retired   Social History Main Topics  . Smoking status: Former Smoker    Packs/day: 1.00    Years: 20.00    Types: Cigarettes    Quit date: 11/17/1969  . Smokeless tobacco: Never Used  . Alcohol use No     Comment: none since 1990  . Drug use: No  . Sexual activity: Not Asked  Other Topics Concern  . None   Social History Narrative   Step son is Clerance Lav; Jenny Reichmann and his wife Janean Sark are primary care takers. They live at [place] [address]     Family History:  The patient's family history includes Colon cancer (age of onset: 9) in his brother; Hodgkin's lymphoma in his grandchild; Hypertension in his brother and father; Parkinsonism in his mother.   ROS:   Please see the history of present illness.    ROS All other systems reviewed and are negative.   PHYSICAL EXAM:   VS:  BP 130/70   Pulse 85   Ht 5' 6" (1.676 m)   Wt 182 lb (82.6 kg)   BMI 29.38 kg/m     General: Alert, oriented x3, no distress. Overweight. Head: no evidence of trauma, PERRL, EOMI, no exophtalmos or lid lag, no myxedema, no xanthelasma; normal ears, nose and oropharynx Neck: normal jugular venous pulsations and no hepatojugular reflux; brisk carotid pulses without delay and no carotid bruits Chest: clear to auscultation, no signs of consolidation by percussion or palpation, normal fremitus, symmetrical and full respiratory excursions Cardiovascular: normal position and quality of the apical impulse, regular rhythm, normal first and paradoxically split second heart sound, no murmurs, rubs or gallops. , healthy left subclavian pacemaker  site. Abdomen: no tenderness or distention, no masses by palpation, no abnormal pulsatility or arterial bruits, normal bowel sounds, no hepatosplenomegaly Extremities: no clubbing, cyanosis or edema; 2+ radial, ulnar and brachial pulses bilaterally; 2+ right femoral, posterior tibial and dorsalis pedis pulses; 2+ left femoral, posterior tibial and dorsalis pedis pulses; no subclavian or femoral bruits Neurological: grossly nonfocal Psych: euthymic mood, full affect  Wt Readings from Last 3 Encounters:  07/22/17 182 lb (82.6 kg)  04/15/17 187 lb 6.4 oz (85 kg)  03/14/17 181 lb 14.4 oz (82.5 kg)      Studies/Labs Reviewed:   EKG:  EKG is ordered today.It shows 100% AV sequential pacing Recent Labs: 03/14/2017: ALT 27 03/16/2017: BUN 11; Creatinine, Ser 0.93; Potassium 4.4; Sodium 133 04/15/2017: Hemoglobin 11.8; Platelets 179   Lipid Panel    Component Value Date/Time   CHOL 152 07/05/2008 2038   TRIG 315 (H) 07/05/2008 2038   HDL 28 (L) 07/05/2008 2038   CHOLHDL 5.4 Ratio 07/05/2008 2038   VLDL 63 (H) 07/05/2008 2038   LDLCALC 61 07/05/2008 2038   LDLDIRECT 84 12/06/2014 1215     ASSESSMENT:    1. Paroxysmal atrial fibrillation (HCC)   2. Second degree AV block   3. Sinoatrial node dysfunction (HCC)   4. Long term current use of anticoagulant   5. Pacemaker - Medtronic Adapta 2006, gen change 2015   6. Preoperative cardiovascular examination      PLAN:  In order of problems listed above:  1. AFIb: Very rare episodes on Multaq. I don't think he's had any in well over a year. This medication has worked very well for him. Required DCCV in 2013.  CHADSVasc score 4 (age 48, HTN1, DM1).  2. 2nd deg AVB: Cause of high prevalence of ventricular pacing, but not pacemaker dependent. Does have underlying ventricular rhythm in the 30s and 40s. 3. SSS: Current sensor settings are appropriate or his rather sedentary lifestyle, no evidence to suggest chronotropic  incompetence 4. Anticoagulation: He has had some minor bleeding complications, but I am concerned about his increasing frequency of falls. There still appears to be overall benefit of anticoagulation, and I  think the solution is to get him in a more closely supervised environment. I encouraged him with his decision to look into assisted living. 5. PPM: Normal pacemaker function, continue remote downloads every 3 months and office visit in 6 months. 6. Preop evaluation: Radwan is very elderly and has worsening stability problems, but from a cardiac point of view I think he could do well with orthopedic surgery, if he does decide to go ahead with the right hip surgery. He will need supervised rehabilitation I think.    Medication Adjustments/Labs and Tests Ordered: Current medicines are reviewed at length with the patient today.  Concerns regarding medicines are outlined above.  Medication changes, Labs and Tests ordered today are listed in the Patient Instructions below. Patient Instructions  Dr Tashya Alberty recommends that you continue on your current medications as directed. Please refer to the Current Medication list given to you today.  Remote monitoring is used to monitor your Pacemaker of ICD from home. This monitoring reduces the number of office visits required to check your device to one time per year. It allows us to keep an eye on the functioning of your device to ensure it is working properly. You are scheduled for a device check from home on Wednesday, December 5th, 2018. You may send your transmission at any time that day. If you have a wireless device, the transmission will be sent automatically. After your physician reviews your transmission, you will receive a postcard with your next transmission date.  Dr Luay Balding recommends that you schedule a follow-up appointment in 6 months with a pacemaker check. You will receive a reminder letter in the mail two months in advance. If you don't  receive a letter, please call our office to schedule the follow-up appointment.  If you need a refill on your cardiac medications before your next appointment, please call your pharmacy.    Signed, Cari Vandeberg, MD  07/22/2017 4:54 PM     [facility] [address], [place], [place]  [address] Phone: [phone]; Fax: [phone]    

## 2017-08-17 NOTE — Anesthesia Preprocedure Evaluation (Addendum)
Anesthesia Evaluation  Patient identified by MRN, date of birth, ID band Patient awake    Reviewed: Allergy & Precautions, NPO status , Patient's Chart, lab work & pertinent test results, reviewed documented beta blocker date and time   Airway Mallampati: III     Mouth opening: Limited Mouth Opening  Dental  (+) Teeth Intact, Poor Dentition   Pulmonary former smoker,    Pulmonary exam normal breath sounds clear to auscultation       Cardiovascular hypertension, Pt. on home beta blockers  Rhythm:Regular Rate:Normal + Systolic murmurs    Neuro/Psych    GI/Hepatic   Endo/Other  diabetes  Renal/GU      Musculoskeletal   Abdominal Normal abdominal exam  (+)   Peds  Hematology   Anesthesia Other Findings  Physician Assistant Signed Anesthesiology  Progress Notes Date of Service: 08/10/2017 10:48 AM     Hide copied text Anesthesia PAT Evaluation: Patient is a 83 year old male scheduled for right THA anterior approach on 08/17/17 by Dr. Rod Ford. Anesthesia is posted for spinal.   History includes afib s/p DCCV 10/01/11 and aborted DCCV 11/02/12 (spontaneous conversion to SR on Multaq), sinus node dysfunction/bradycardia with syncope s/p Medtronic PPM (inserted 10/14/05; generator change 03/14/14), former smoker (quit '71), DM2 (diet controlled), HTN, CKD stage III, hiatal hernia, GERD, skin cancer, diverticulosis, BPH s/p TURP, gout, overactive bladder, SBO s/p LOA 10/08/05, tonsillectomy, appendectomy, left THA 07/21/07.   - PCP is Dr. Dorcas Ford. She is aware of surgery plans. - Cardiologist is Dr. Sanda Ford, last visit 07/22/17. On device interrogation, he was 89% atrial pacing and 100% ventricular pacing. No afib or new episodes of VT recorded. He wrote, "Preop evaluation:[name] is very elderly and has worsening stability problems, but from a cardiac point of view I think he could do well with orthopedic surgery, if  he does decide to go ahead with the right hip surgery. He will need supervised rehabilitation I think." - Urologist is with [facility].  Meds include Multaq, Bystolic, Protonix, ramipril, Xarelto (last dose to be 08/13/17), Rapaflo, torsemide, tramadol.   BP 140/70   Pulse 76   Temp 36.6 C   Resp 20   Ht 5\' 6"  (1.676 m)   Wt 178 lb 4.8 oz (80.9 kg)   SpO2 99%   BMI 28.78 kg/m  Patient presents with his daughter-in-law. Patient is in a hospital wheelchair. Reports he uses a walker at home and only a wheelchair with prolonged walking. He feels in his usual state of health except his fell on his right side a few weeks ago. In getting up he felt his aggravated his right shoulder arthritis. He Ford still move his shoulder, but feels sore. He has been sleeping in a recliner because his bed is high and was becoming more difficult to get into his bed. When in his bed, he normally sleeps on his side for comfort, but feels he Ford lie flat if needed. He denied SOB, syncope. He has chronic BLE edema which he feels is stable. He has a left sided PPM. He reports 3-4 times per year (last several weeks ago) he has a sharp left chest pain that only lasts "a second." There is no associated nausea, diaphoresis, SOB. It goes a way on its own and is not associated with activity. He says he seldom gets these pains, but they have sporadically occurred now "for a while." He Ford't give me a real specific time frame other than to say that  they have occurred for "over a year." He thinks they are "gas pains." They are not particularly bothersome to patient, and reportedly Dr. Sallyanne Ford was not concerned as well.  Heart RRR, no murmur noted. Lungs a little coarse at based but after cough, were clear. No carotid bruits auscultated. He has 1+ bilateral ankle edema. He wears complete upper dentures and has some missing lower teeth. He denied prior back surgery. Reported prior "spinal" for prostate surgery years ago.   EKG  07/22/17: AV dual paced rhythm.  Echo 01/24/11 (found under Notes tab): Conclusions: - LV: The cavity size is normal. There is moderate concentric hypertrophy. Systolic function was normal. The estimated EF 60-65%. Normal wall motion. No regional wall motion abnormalities. Stage II diastolic dysfunction. - Mitral valve: Mild regurgitation - LA: Moderate left atrial enlargement. - RV: Pacer wire or catheter noted in RV. - RA: Pacer wire or catheter noted in RA.  - Pulmonary arteries: PA peak pressure 41 mmHg. - Pericardium: Trivial pericardial effusion identified posterior to the heart.  Nuclear stress test 03/04/10 [facility] now part of [facility]): Impression: The post stress myocardial perfusion images show normal pattern perfusion in all regions. The post stress LV is normal in size. There is no scintigraphic evidence of inducible myocardial ischemia. Post stress EF 81%. Global left ventricular systolic function normal. No significant wall motion abnormalities. EKG is negative for ischemia. Normal myocardial perfusion imaging. Low risk scan.  CXR 03/14/17: IMPRESSION: No active cardiopulmonary disease.  Preoperative labs Glucose 108. Cr 1.10. H/H 12.0/37.2. PLT 176. A1c 5.8. He is for PT/INR on the day of surgery.   He reported being instructed to hold Xarelto 3 days (72 hours) before surgery. He is anticipating spinal anesthesia. He had recent cardiology evaluation with preoperative evaluation as above. He does not report any symptoms typical of angina. No SOB. His daughter-in-law will be with him on the day of surgery. He lives alone, but plans to go to [facility] facility following hospitalization. He is entertaining the idea of ultimately going to an assisted living facility. If no acute changes then I anticipate that he Ford proceed as planned.  [name]/Anesthesiology Phone [phone] 08/10/2017 2:22 PM      Reproductive/Obstetrics                             Anesthesia Physical Anesthesia Plan  ASA: III  Anesthesia Plan: Spinal   Post-op Pain Management:    Induction:   PONV Risk Score and Plan: 2 and Ondansetron and Dexamethasone  Airway Management Planned: Natural Airway, Nasal Cannula and Simple Face Mask  Additional Equipment:   Intra-op Plan:   Post-operative Plan:   Informed Consent: I have reviewed the patients History and Physical, chart, labs and discussed the procedure including the risks, benefits and alternatives for the proposed anesthesia with the patient or authorized representative who has indicated his/her understanding and acceptance.     Plan Discussed with: CRNA and Surgeon  Anesthesia Plan Comments:        Anesthesia Quick Evaluation

## 2017-08-17 NOTE — Anesthesia Procedure Notes (Signed)
Procedure Name: MAC Date/Time: 08/17/2017 12:16 PM Performed by: Lance Coon Pre-anesthesia Checklist: Patient identified, Emergency Drugs available, Suction available, Patient being monitored and Timeout performed Patient Re-evaluated:Patient Re-evaluated prior to induction Oxygen Delivery Method: Nasal cannula

## 2017-08-17 NOTE — Op Note (Signed)
OPERATIVE REPORT  SURGEON: Rod Can, MD   ASSISTANT: Ky Barban, RNFA.  PREOPERATIVE DIAGNOSIS: Right hip arthritis.   POSTOPERATIVE DIAGNOSIS: Right hip arthritis.   PROCEDURE: Right total hip arthroplasty, anterior approach.   IMPLANTS: DePuy Tri Lock stem, size 7, std offset. DePuy Pinnacle Cup, size 56 mm. DePuy Altrx liner, size 36 by 56 mm, neutral. DePuy Biolox ceramic head ball, size 36 + 1.5 mm.  ANESTHESIA:  Spinal  ESTIMATED BLOOD LOSS: 200 mL.  ANTIBIOTICS: 2 g Ancef.  DRAINS: None.  COMPLICATIONS: None.   CONDITION: PACU - hemodynamically stable.   BRIEF CLINICAL NOTE: [name] is a 83 y.o. male with a long-standing history of Right hip arthritis. After failing conservative management, the patient was indicated for total hip arthroplasty. The risks, benefits, and alternatives to the procedure were explained, and the patient elected to proceed.  PROCEDURE IN DETAIL: Surgical site was marked by myself in the pre-op holding area. Once inside the operating room, spinal anesthesia was obtained, and a foley catheter was inserted. The patient was then positioned on the [place] table. All bony prominences were well padded. The hip was prepped and draped in the normal sterile surgical fashion. A time-out was called verifying side and site of surgery. The patient received IV antibiotics within 60 minutes of beginning the procedure.  The direct anterior approach to the hip was performed through the Hueter interval. Lateral femoral circumflex vessels were treated with the Auqumantys. The anterior capsule was exposed and an inverted T capsulotomy was made.The femoral neck cut was made to the level of the templated cut. A corkscrew was placed into the head and the head was removed. The femoral head was found to have eburnated bone. The head was passed to the back table and was measured.  Acetabular exposure was achieved, and the pulvinar and labrum were  excised. Sequential reaming of the acetabulum was then performed up to a size 55 mm reamer. A 56 mm cup was then opened and impacted into place at approximately 40 degrees of abduction and 20 degrees of anteversion. The final polyethylene liner was impacted into place and acetabular osteophytes were removed.   I then gained femoral exposure taking care to protect the abductors and greater trochanter. This was performed using standard external rotation, extension, and adduction. The capsule was peeled off the inner aspect of the greater trochanter, taking care to preserve the short external rotators. A cookie cutter was used to enter the femoral canal, and then the femoral canal finder was placed. Sequential broaching was performed up to a size 7. Calcar planer was used on the femoral neck remnant. I placed a std offset neck and a trial head ball. The hip was reduced. Leg lengths and offset were checked fluoroscopically. The hip was dislocated and trial components were removed. The final implants were placed, and the hip was reduced.  Fluoroscopy was used to confirm component position and leg lengths. At 90 degrees of external rotation and full extension, the hip was stable to an anterior directed force.  The wound was copiously irrigated with normal saline using pulse lavage. Marcaine solution was injected into the periarticular soft tissue. The wound was closed in layers using #1 Vicryl and V-Loc for the fascia, 2-0 Vicryl for the subcutaneous fat, 2-0 Monocryl for the deep dermal layer, 3-0 running Monocryl subcuticular stitch, and Dermabond for the skin. Once the glue was fully dried, an Aquacell Ag dressing was applied. The patient was transported to the recovery room in  stable condition. Sponge, needle, and instrument counts were correct at the end of the case x2. The patient tolerated the procedure well and there were no known complications.  Postoperatively, we will admit the patient  to Dr. Francee Piccolo service. He may weight-bear as tolerated with a walker. He will work with physical and occupational therapy. He will undergo disposition planning. We will start Xarelto 10 mg daily beginning tomorrow. After 72 hours, his regular dose can be resumed.

## 2017-08-18 ENCOUNTER — Encounter (HOSPITAL_COMMUNITY): Payer: Self-pay | Admitting: Orthopedic Surgery

## 2017-08-18 LAB — CBC
HCT: 19.8 % — ABNORMAL LOW (ref 39.0–52.0)
HEMATOCRIT: 19.3 % — AB (ref 39.0–52.0)
HEMATOCRIT: 25.8 % — AB (ref 39.0–52.0)
HEMOGLOBIN: 6.5 g/dL — AB (ref 13.0–17.0)
Hemoglobin: 6.6 g/dL — CL (ref 13.0–17.0)
Hemoglobin: 8.8 g/dL — ABNORMAL LOW (ref 13.0–17.0)
MCH: 29.7 pg (ref 26.0–34.0)
MCH: 28.9 pg (ref 26.0–34.0)
MCH: 30.1 pg (ref 26.0–34.0)
MCHC: 33.7 g/dL (ref 30.0–36.0)
MCHC: 32.8 g/dL (ref 30.0–36.0)
MCHC: 34.1 g/dL (ref 30.0–36.0)
MCV: 88.1 fL (ref 78.0–100.0)
MCV: 88 fL (ref 78.0–100.0)
MCV: 88.4 fL (ref 78.0–100.0)
PLATELETS: 129 10*3/uL — AB (ref 150–400)
PLATELETS: 123 10*3/uL — AB (ref 150–400)
Platelets: 121 10*3/uL — ABNORMAL LOW (ref 150–400)
RBC: 2.19 MIL/uL — AB (ref 4.22–5.81)
RBC: 2.25 MIL/uL — ABNORMAL LOW (ref 4.22–5.81)
RBC: 2.92 MIL/uL — AB (ref 4.22–5.81)
RDW: 15.8 % — ABNORMAL HIGH (ref 11.5–15.5)
RDW: 15.5 % (ref 11.5–15.5)
RDW: 14.9 % (ref 11.5–15.5)
WBC: 9.8 10*3/uL (ref 4.0–10.5)
WBC: 9.8 10*3/uL (ref 4.0–10.5)
WBC: 10.2 10*3/uL (ref 4.0–10.5)

## 2017-08-18 LAB — BASIC METABOLIC PANEL
Anion gap: 7 (ref 5–15)
BUN: 23 mg/dL — AB (ref 6–20)
CHLORIDE: 101 mmol/L (ref 101–111)
CO2: 23 mmol/L (ref 22–32)
Calcium: 7.6 mg/dL — ABNORMAL LOW (ref 8.9–10.3)
Creatinine, Ser: 1.3 mg/dL — ABNORMAL HIGH (ref 0.61–1.24)
GFR calc Af Amer: 52 mL/min — ABNORMAL LOW (ref >60)
GFR calc non Af Amer: 45 mL/min — ABNORMAL LOW (ref >60)
GLUCOSE: 175 mg/dL — AB (ref 65–99)
POTASSIUM: 4 mmol/L (ref 3.5–5.1)
Sodium: 131 mmol/L — ABNORMAL LOW (ref 135–145)

## 2017-08-18 LAB — GLUCOSE, CAPILLARY
GLUCOSE-CAPILLARY: 148 mg/dL — AB (ref 65–99)
Glucose-Capillary: 119 mg/dL — ABNORMAL HIGH (ref 65–99)
Glucose-Capillary: 158 mg/dL — ABNORMAL HIGH (ref 65–99)
Glucose-Capillary: 131 mg/dL — ABNORMAL HIGH (ref 65–99)

## 2017-08-18 LAB — PREPARE RBC (CROSSMATCH)

## 2017-08-18 MED ORDER — SODIUM CHLORIDE 0.9 % IV SOLN
Freq: Once | INTRAVENOUS | Status: AC
  Administered 2017-08-18: 16:00:00 via INTRAVENOUS

## 2017-08-18 MED ORDER — INFLUENZA VAC SPLIT HIGH-DOSE 0.5 ML IM SUSY: 0.5000 mL | PREFILLED_SYRINGE | Freq: Once | INTRAMUSCULAR | Status: DC

## 2017-08-18 MED ORDER — TRAMADOL HCL 50 MG PO TABS
50.0000 mg | ORAL_TABLET | Freq: Two times a day (BID) | ORAL | Status: DC
  Administered 2017-08-18 – 2017-08-20 (×5): 50 mg via ORAL
  Filled 2017-08-18 (×5): qty 1

## 2017-08-18 MED ORDER — WHITE PETROLATUM GEL
Status: AC
  Administered 2017-08-18: 20:00:00
  Filled 2017-08-18: qty 1

## 2017-08-18 MED ORDER — SODIUM CHLORIDE 0.9 % IV SOLN
Freq: Once | INTRAVENOUS | Status: AC
  Administered 2017-08-18: 11:00:00 via INTRAVENOUS

## 2017-08-18 NOTE — Anesthesia Postprocedure Evaluation (Signed)
Anesthesia Post Note  Patient: TRESHON STANNARD  Procedure(s) Performed: RIGHT TOTAL HIP ARTHROPLASTY ANTERIOR APPROACH (Right Hip)     Patient location during evaluation: PACU Anesthesia Type: Spinal Level of consciousness: awake Pain management: pain level controlled Vital Signs Assessment: post-procedure vital signs reviewed and stable Respiratory status: spontaneous breathing Cardiovascular status: stable Postop Assessment: no apparent nausea or vomiting Anesthetic complications: no    Last Vitals:  Vitals:   08/18/17 0234 08/18/17 0428  BP: (!) 98/41 (!) 105/51  Pulse: 78 70  Resp: 18 18  Temp: 37.1 C (!) 36.4 C  SpO2: 100% 100%    Last Pain:  Vitals:   08/18/17 0428  TempSrc: Axillary  PainSc:    Pain Goal: Patients Stated Pain Goal: 4 (08/17/17 0959)               Azuree Minish JR,JOHN Mateo Flow

## 2017-08-18 NOTE — Progress Notes (Signed)
CRITICAL VALUE ALERT  Critical Value:  Hgb 6.5  Date & Time Notied:  08/18/2017 at [place]  Provider Notified: Diallo MD  Orders Received/Actions taken: none, Md to follow up with  Morning team and ortho.

## 2017-08-18 NOTE — Evaluation (Signed)
Physical Therapy Evaluation Patient Details Name: [name] MRN: [id number] DOB: 11/10/1920 Today's Date: 08/18/2017   History of Present Illness  Pt is a 83 year old man admitted for elective R THA (direct anterior approach). PMH includes L posterior THA, HTN, DM, afib which converted, gout.    Clinical Impression  Pt presents with pain and an overall decrease in functional mobility secondary to above. PTA, pt lives alone and is mod indep for mobility. Requires mod-maxA+2 for bed mobility and standing with RW; able to take side steps towards [facility] with cues for sequencing. Pt very pleasant and motivated to participate with therapies. Pt would benefit from continued acute PT services to maximize functional mobility and independence prior to d/c with SNF-level therapies.     Follow Up Recommendations SNF;Supervision/Assistance - 24 hour    Equipment Recommendations  Other (comment) (defer to next venue)    Recommendations for Other Services       Precautions / Restrictions Precautions Precautions: Fall Restrictions Weight Bearing Restrictions: Yes RLE Weight Bearing: Weight bearing as tolerated      Mobility  Bed Mobility Overal bed mobility: Needs Assistance Bed Mobility: Supine to Sit;Sit to Supine     Supine to sit: Max assist Sit to supine: +2 for physical assistance;Max assist   General bed mobility comments: cues for technique, assist for R LE to EOB and to raise trunk pulling up on rail and therapist's hand, assist of bed pad for hips to EOB, required assist for all aspects to return to supine  Transfers Overall transfer level: Needs assistance Equipment used: Rolling walker (2 wheeled) Transfers: Sit to/from Stand Sit to Stand: +2 physical assistance;Mod assist         General transfer comment: cues for hand placement, assist to rise and steady, pt able to take several steps toward [facility] with +2 min assist.  Ambulation/Gait                Stairs             Wheelchair Mobility    Modified Rankin (Stroke Patients Only)       Balance Overall balance assessment: Needs assistance Sitting-balance support: No upper extremity supported;Feet supported Sitting balance-Leahy Scale: Fair     Standing balance support: Bilateral upper extremity supported Standing balance-Leahy Scale: Poor Standing balance comment: Reliant on BUE support and assist for standing                             Pertinent Vitals/Pain Pain Assessment: 0-10 Pain Score: 5  Pain Location: R hip Pain Descriptors / Indicators: Sore Pain Intervention(s): Monitored during session;Ice applied;Repositioned    Home Living Family/patient expects to be discharged to:: Skilled nursing facility Living Arrangements: Alone   Type of Home: House Home Access: Stairs to enter Entrance Stairs-Rails: Left Entrance Stairs-Number of Steps: 2 Home Layout: One level Home Equipment: Walker - 2 wheels;Shower seat - built in;Bedside commode;Walker - 4 wheels;Cane - single point;Wheelchair - Psychologist, educational      Prior Function Level of Independence: Independent with assistive device(s)         Comments: drives short distances, walks with either at [place] or rollator, performs his own meal prep and housekeeping     Hand Dominance   Dominant Hand: Right    Extremity/Trunk Assessment   Upper Extremity Assessment Upper Extremity Assessment: RUE deficits/detail RUE Deficits / Details: reports soreness in R shoulder from fall he had 1  month ago    Lower Extremity Assessment Lower Extremity Assessment: Generalized weakness;RLE deficits/detail RLE Deficits / Details: s/p R THA       Communication   Communication: HOH  Cognition Arousal/Alertness: Awake/alert Behavior During Therapy: WFL for tasks assessed/performed Overall Cognitive Status: Within Functional Limits for tasks assessed                                         General Comments      Exercises     Assessment/Plan    PT Assessment Patient needs continued PT services  PT Problem List Decreased strength;Decreased range of motion;Decreased activity tolerance;Decreased balance;Decreased mobility;Decreased knowledge of use of DME;Pain       PT Treatment Interventions DME instruction;Gait training;Stair training;Functional mobility training;Therapeutic activities;Therapeutic exercise;Balance training;Patient/family education    PT Goals (Current goals can be found in the Care Plan section)  Acute Rehab PT Goals Patient Stated Goal: to go to rehab and then decide if he needs ALF PT Goal Formulation: With patient Time For Goal Achievement: 09/01/17 Potential to Achieve Goals: Good    Frequency 7X/week   Barriers to discharge Decreased caregiver support      Co-evaluation PT/OT/SLP Co-Evaluation/Treatment: Yes Reason for Co-Treatment: For patient/therapist safety;To address functional/ADL transfers PT goals addressed during session: Mobility/safety with mobility;Balance OT goals addressed during session: ADL's and self-care       AM-PAC PT "6 Clicks" Daily Activity  Outcome Measure Difficulty turning over in bed (including adjusting bedclothes, sheets and blankets)?: Unable Difficulty moving from lying on back to sitting on the side of the bed? : Unable Difficulty sitting down on and standing up from a chair with arms (e.g., wheelchair, bedside commode, etc,.)?: Unable Help needed moving to and from a bed to chair (including a wheelchair)?: A Little Help needed walking in hospital room?: A Little Help needed climbing 3-5 steps with a railing? : A Lot 6 Click Score: 11    End of Session Equipment Utilized During Treatment: Gait belt Activity Tolerance: Patient tolerated treatment well;Patient limited by fatigue Patient left: in bed;with call bell/phone within reach Nurse Communication: Mobility status PT Visit Diagnosis: Other  abnormalities of gait and mobility (R26.89);Pain Pain - Right/Left: Right Pain - part of body: Hip    Time: 1425-1456 PT Time Calculation (min) (ACUTE ONLY): 31 min   Charges:   PT Evaluation $PT Eval Moderate Complexity: 1 Mod     PT G Codes:       Mabeline Caras, PT, DPT Acute Rehab Services  Pager: [place] 08/18/2017, 3:23 PM

## 2017-08-18 NOTE — Progress Notes (Signed)
OT Cancellation Note  Patient Details Name: DAINEL ARCIDIACONO MRN: [id number] DOB: 08-27-1920   Cancelled Treatment:    Reason Eval/Treat Not Completed: Medical issues which prohibited therapy (HgB of 6.5. Will follow.)  Malka So 08/18/2017, 8:23 AM  08/18/2017 Nestor Lewandowsky, OTR/L Pager: [phone]

## 2017-08-18 NOTE — Progress Notes (Signed)
Intern pager contacted by patient's nurse as she noticed there was only 1u of RBC that showed up to be transfused and we had discussed 2u.   We appreciate her notification and we contacted the blood bank to resolve the confusion and followed their instructions to ensure the patient received a total of 2u transfused today.  We called his nurse again after the orders were set to ensure she was aware of the plan.  -Dr. Criss Rosales

## 2017-08-18 NOTE — Progress Notes (Signed)
   Subjective:  Patient reports pain as mild to moderate.  Denies N/V/CP/SOB.  Objective:   VITALS:   Vitals:   08/17/17 2242 08/17/17 2335 08/18/17 0234 08/18/17 0428  BP: (!) 99/43 (!) 105/52 (!) 98/41 (!) 105/51  Pulse: 72 71 78 70  Resp: 19 17 18 18   Temp:   98.7 F (37.1 C) (!) 97.5 F (36.4 C)  TempSrc:   Axillary Axillary  SpO2: 100% 100% 100% 100%  Weight:  80.7 kg (178 lb)    Height:  5\' 6"  (1.676 m)      NAD ABD soft Sensation intact distally Intact pulses distally Dorsiflexion/Plantar flexion intact Incision: dressing C/D/I Compartment soft Thigh mildly swollen, no hematoma   Lab Results  Component Value Date   WBC 9.8 08/18/2017   HGB 6.6 (LL) 08/18/2017   HCT 19.8 (L) 08/18/2017   MCV 88.0 08/18/2017   PLT 129 (L) 08/18/2017   BMET    Component Value Date/Time   NA 131 (L) 08/18/2017 0344   NA 131 (L) 11/19/2016 0000   K 4.0 08/18/2017 0344   CL 101 08/18/2017 0344   CO2 23 08/18/2017 0344   GLUCOSE 175 (H) 08/18/2017 0344   BUN 23 (H) 08/18/2017 0344   BUN 23 11/19/2016 0000   CREATININE 1.30 (H) 08/18/2017 0344   CREATININE 1.16 (H) 01/28/2017 1111   CALCIUM 7.6 (L) 08/18/2017 0344   GFRNONAA 45 (L) 08/18/2017 0344   GFRAA 52 (L) 08/18/2017 0344     Assessment/Plan: 1 Day Post-Op   Principal Problem:   Osteoarthritis of right hip Active Problems:   Elective surgery   WBAT with walker DVT ppx: xarelto - would hold today due to risk of bleeding, SCDs, TEDs ABLA: hgb 6.6 today, surgical blood loss was less than average, ? Due to fluid shift / 3rd spacing, no evidence of significant hematoma, transfuse 2 units PRBCs today and check response, consider CT angio of pelvis if hgb not improved with transfusion PT/OT Dispo: D/C planning   Amali Uhls, Horald Pollen 08/18/2017, 9:04 AM   Rod Can, MD Cell [phone]

## 2017-08-18 NOTE — Evaluation (Signed)
Occupational Therapy Evaluation Patient Details Name: [name] MRN: [id number] DOB: 03/18/1920 Today's Date: 08/18/2017    History of Present Illness Pt is a 83 year old man admitted for elective R DATHR. PMH: L posterior THR, HTN, DM, afib which converted, gout   Clinical Impression   Pt lives alone and functions modified independently. He present with post operative pain, but performed remarkably well POD 1. Pt requires 2 person assist for mobility, but was able to stand and take several steps toward [facility] with RW. He requires supervision to total assist for ADL. Pt will need post acute rehab prior to return home. He is in agreement. Will follow acutely.    Follow Up Recommendations  SNF;Supervision/Assistance - 24 hour    Equipment Recommendations       Recommendations for Other Services       Precautions / Restrictions Precautions Precautions: Fall Restrictions RLE Weight Bearing: Weight bearing as tolerated      Mobility Bed Mobility Overal bed mobility: Needs Assistance Bed Mobility: Supine to Sit;Sit to Supine     Supine to sit: Max assist Sit to supine: +2 for physical assistance;Max assist   General bed mobility comments: cues for technique, assist for R LE to EOB and to raise trunk pulling up on rail and therapist's hand, assist of bed pad for hips to EOB, required assist for all aspects to return to supine  Transfers Overall transfer level: Needs assistance Equipment used: Rolling walker (2 wheeled) Transfers: Sit to/from Stand Sit to Stand: +2 physical assistance;Mod assist         General transfer comment: cues for hand placement, assist to rise and steady, pt able to take several steps toward [facility] with +2 min assist.    Balance Overall balance assessment: Needs assistance   Sitting balance-Leahy Scale: Fair       Standing balance-Leahy Scale: Poor                             ADL either performed or assessed with clinical  judgement   ADL Overall ADL's : Needs assistance/impaired Eating/Feeding: Independent;Bed level   Grooming: Wash/dry hands;Wash/dry face;Sitting;Supervision/safety;Set up   Upper Body Bathing: Minimal assistance;Sitting   Lower Body Bathing: +2 for physical assistance;Total assistance;Sit to/from stand   Upper Body Dressing : Set up;Supervision/safety;Sitting   Lower Body Dressing: +2 for physical assistance;Total assistance;Sit to/from stand       Toileting- Water quality scientist and Hygiene: Total assistance;+2 for physical assistance;Sit to/from stand         General ADL Comments: Pt had received one unit of blood, awaiting second, limited activity     Vision Baseline Vision/History: Wears glasses Wears Glasses: At all times Patient Visual Report: No change from baseline       Perception     Praxis      Pertinent Vitals/Pain Pain Assessment: 0-10 Pain Score: 5  Pain Location: R hip Pain Descriptors / Indicators: Sore Pain Intervention(s): Monitored during session;Repositioned;Ice applied     Hand Dominance Right   Extremity/Trunk Assessment Upper Extremity Assessment Upper Extremity Assessment: RUE deficits/detail RUE Deficits / Details: reports soreness in R shoulder from fall he had 1 month ago   Lower Extremity Assessment Lower Extremity Assessment: Defer to PT evaluation       Communication Communication Communication: HOH   Cognition Arousal/Alertness: Awake/alert Behavior During Therapy: WFL for tasks assessed/performed Overall Cognitive Status: Within Functional Limits for tasks assessed  General Comments       Exercises     Shoulder Instructions      Home Living Family/patient expects to be discharged to:: Private residence Living Arrangements: Alone   Type of Home: House Home Access: Stairs to enter [employer] of Steps: 2 Entrance Stairs-Rails: Left Home Layout: One  level     Bathroom Shower/Tub: Occupational psychologist: [place]: Environmental consultant - 2 wheels;Shower seat - built in;Bedside commode;Walker - 4 wheels;Cane - single point;Wheelchair - Scientist, physiological: Reacher;Sock aid        Prior Functioning/Environment Level of Independence: Independent with assistive device(s)        Comments: drives short distances, walks with either at [place] or rollator, performs his own meal prep and housekeeping        OT Problem List: Decreased strength;Decreased activity tolerance;Impaired balance (sitting and/or standing);Decreased knowledge of use of DME or AE;Pain      OT Treatment/Interventions: Self-care/ADL training;DME and/or AE instruction;Therapeutic activities;Patient/family education;Balance training    OT Goals(Current goals can be found in the care plan section) Acute Rehab OT Goals Patient Stated Goal: to go to rehab and then decide if he needs ALF OT Goal Formulation: With patient Time For Goal Achievement: 09/01/17 Potential to Achieve Goals: Good ADL Goals Pt Will Perform Grooming: with min assist;standing Pt Will Perform Lower Body Bathing: with min assist;sit to/from stand;with adaptive equipment Pt Will Perform Lower Body Dressing: with min assist;with adaptive equipment;sit to/from stand Pt Will Transfer to Toilet: with min assist;ambulating;bedside commode Pt Will Perform Toileting - Clothing Manipulation and hygiene: with min assist;sit to/from stand Additional ADL Goal #1: Pt will perform bed mobility with min assist.  OT Frequency: Min 2X/week   Barriers to D/C: Decreased caregiver support          Co-evaluation PT/OT/SLP Co-Evaluation/Treatment: Yes Reason for Co-Treatment: For patient/therapist safety   OT goals addressed during session: ADL's and self-care      AM-PAC PT "6 Clicks" Daily Activity     Outcome Measure Help from another person eating meals?:  None Help from another person taking care of personal grooming?: A Little Help from another person toileting, which includes using toliet, bedpan, or urinal?: A Lot Help from another person bathing (including washing, rinsing, drying)?: A Lot Help from another person to put on and taking off regular upper body clothing?: A Little Help from another person to put on and taking off regular lower body clothing?: Total 6 Click Score: 15   End of Session Equipment Utilized During Treatment: Gait belt;Rolling walker Nurse Communication: Mobility status  Activity Tolerance: Treatment limited secondary to medical complications (Comment) (low HgB) Patient left: in bed;with call bell/phone within reach;with bed alarm set  OT Visit Diagnosis: Unsteadiness on feet (R26.81);Other abnormalities of gait and mobility (R26.89);Muscle weakness (generalized) (M62.81);Pain Pain - Right/Left: Right Pain - part of body: Hip                Time: 4888-9169 OT Time Calculation (min): 32 min Charges:  OT General Charges $OT Visit: 1 Visit OT Evaluation $OT Eval Moderate Complexity: 1 Mod G-Codes:     Malka So 08/18/2017, 3:09 PM  08/18/2017 Nestor Lewandowsky, OTR/L Pager: [phone]

## 2017-08-18 NOTE — Progress Notes (Signed)
Family Medicine Teaching Service Daily Progress Note Intern Pager: [phone]  Patient name: [name] Medical record number: [id number] Date of birth: [birth year] Age: 83 y.o. Gender: male  Primary Care Provider: Dickie La, MD Consultants: Orthopedics Code Status: full  Pt Overview and Major Events to Date:  [name] is a 83 year old male presenting with medical management for post-operative management for right total hip arthroplasty. Patient with hypotension overnight on POD#0. Required fluid boluses. Found to have post-op cbc of 6.6. Transfused 2 uprbc on 10/2.  Assessment and Plan: Total Hip arthroplasty Patient is POD#1 for total hip arthroplasty. Has soft right hip and palpable pt/dp in right foot. Patient with acute drop in hgb down to 6.6 overnight. Unclear if anemia from intraop blood loss, post-surgical blood loss, or some other process. Patient to receive 2 uprbc today, will have pt/ot evaluate afterwards. He is WBAT on right hip. Will need some pain control but will be careful due to advanced age. Will start on tylenol, tramadol, and dilaudid only for breakthrough. - vitals signs per floor routine - dilaudid 1mg  q 2 hours only for breakthrough - WBAT with walker - F/U PT/OT - cbc in am - incentive spirometer - will hold xarelto tonight  Anemia Patient with large decrease in hemoglobin from pre to post op hgb level. Will transfuse 2 uprbc and get cbc in am. Was symptomatic overnight but doing well this morning and afternoon. Holding xarelto. If patient continues to lose blood will consider ct scan. - cbc in am - holding xarelto - transfuse 2 uprbc - consider ct scan if levels continue to fall - monitor vitals  Atrial fibrillation Recently seen by cardiology on 9/5 for these issues. Pacemaker interrogated at that appointment and showed normal device function. 89% atrial pacing and 100% ventricular pacing. No episodes of afib. CHADSVasc score of 4. Takes Xarelto  15mg  at home for stroke ppx. Managed with dronedarone 400mg  tablet bid, and nebivolol 10mg  daily. Will resume betablocker and dronedarone at home dose. Will resume xarelto at 10 mg on 10/2. Will keep at that dose until 10/5 when can resume home dose of 15mg  daily. - continue dronedarone 400mg  tablet bid - Continue nebivolol 10mg  daily - will hold xarelto overnight  Sinus node dysfunction/av block/Pacemaker Patient with prolonged QT/qtc interval on past ekgs. Would avoid antiemetics unless necessary. Pacing would hopefully deter some if not all arrhythmias, but these drugs would best be avoided. - continue dronederone 400 mg twice a day - continue nebivolol 10 mgdaily - hold xarelto overnight - would avoid antiemetics if necessary - would choose Reglan or Compazine if necessary  Recurrent Small Bowel Obstruction Patient with history of small bowel obstruction requires hospitalization. Will plan to take patient's diet very slowly as he is at risk for ileus development even without the added stress from a surgery. He is at lower risk due to no gi involvement with the surgery but he is still at high risk. - Carb modified regular diet - miralax 17g bid - would hold off on anti-emetics unless needed  Hypertension Patient with hypertension at baseline. Takes nebivolol, ramipril, torsemide. Holding ramipril and torsemide during admission due to risk for dehydration and aki in post-operative patient. If patient becomes hypertensive overnight can start prn hydralazine. Pain control will also play some aspect in decreasing hypertension. - monitor blood pressure with vital signs - continue nebivolol 10mg  - hold ramipril 2.5mg  - hold torsemide  CKD Stage III Patient with history of cr  1.35 and GFR 43 on bmp 5 months ago. At per-operative lab check on 9/24 patient with creatinine of 1.1 and GFR > 60 so this seems to have resolved. Will monitor these values with daily bmp checks while admitted.  Encourage PO intake, will keep on fluids until tolerating good PO. - daily bmp - hold ramipril, torsemide while inpatient  Type II Diabetes Patient with past diagnosis of T2DM.Last A1c 5.8. Does not take any oral medication or insulin. Will monitor sugars while inpatient as post-inflammatory state could cause hyperglycemia. If sugars consistently elevated will consider Sliding scale.  - Monitor CBGs  FEN/GI: carb modified, miralax PPx: miralax  Disposition: snf  Subjective:  Doing well this morning. Eating breakfast with no complaints. No bm,  Objective: Temp:  [97 F (36.1 C)-98.7 F (37.1 C)] 98.2 F (36.8 C) (10/02 1404) Pulse Rate:  [68-78] 69 (10/02 1404) Resp:  [12-19] 18 (10/02 1404) BP: (77-131)/(35-91) 131/55 (10/02 1404) SpO2:  [94 %-100 %] 95 % (10/02 1404) Weight:  [178 lb (80.7 kg)] 178 lb (80.7 kg) (10/01 2335) Physical Exam: General: well-appearing, resting comfortably, no acute distress, alert, oriented Cardiovascular: regular rate and rhythm, palpable pt/dp bilaterally, palpable radial bilaterally,  Respiratory: lungs cleat to auscultation bilaterally, no chest pain Abdomen: soft, non-tender, non-distended Extremities: right hip soft, non-tender, no signs of developing hematoma  Laboratory:  Recent Labs Lab 08/18/17 0344 08/18/17 0545  WBC 9.8 9.8  HGB 6.5* 6.6*  HCT 19.3* 19.8*  PLT 121* 129*    Recent Labs Lab 08/18/17 0344  NA 131*  K 4.0  CL 101  CO2 23  BUN 23*  CREATININE 1.30*  CALCIUM 7.6*  GLUCOSE 175*     Imaging/Diagnostic Tests: no new imaging   Guadalupe Dawn, MD 08/18/2017, 3:55 PM PGY-1, [place] Intern pager: [phone], text pages welcome

## 2017-08-18 NOTE — Care Management Note (Signed)
Case Management Note  Patient Details  Name: [name] MRN: [id number] Date of Birth: 27-Oct-1920  Subjective/Objective:                    Action/Plan: Will continue to follow for discharge needs. Patient was pre op set up with [facility] . Will await PT eval  Expected Discharge Date:                  Expected Discharge Plan:     In-House Referral:  Clinical Social Work  Discharge planning Services  CM Consult  Post Acute Care Choice:    Choice offered to:     DME Arranged:    DME Agency:     HH Arranged:    HH Agency:     Status of Service:  In process, will continue to follow  If discussed at Long Length of Stay Meetings, dates discussed:    Additional Comments:  Marilu Favre, RN 08/18/2017, 12:32 PM

## 2017-08-18 NOTE — Discharge Instructions (Signed)
°Dr. Brian Swinteck °Joint Replacement Specialist °[facility] °[address] °[place],  [address] °[phone] ° ° °TOTAL HIP REPLACEMENT POSTOPERATIVE DIRECTIONS ° ° ° °Hip Rehabilitation, Guidelines Following Surgery  ° °WEIGHT BEARING °Weight bearing as tolerated with assist device (walker, cane, etc) as directed, use it as long as suggested by your surgeon or therapist, typically at least 4-6 weeks. ° °The results of a hip operation are greatly improved after range of motion and muscle strengthening exercises. Follow all safety measures which are given to protect your hip. If any of these exercises cause increased pain or swelling in your joint, decrease the amount until you are comfortable again. Then slowly increase the exercises. Call your caregiver if you have problems or questions.  ° °HOME CARE INSTRUCTIONS  °Most of the following instructions are designed to prevent the dislocation of your new hip.  °Remove items at home which could result in a fall. This includes throw rugs or furniture in walking pathways.  °Continue medications as instructed at time of discharge. °· You may have some home medications which will be placed on hold until you complete the course of blood thinner medication. °· You may start showering once you are discharged home. Do not remove your dressing. °Do not put on socks or shoes without following the instructions of your caregivers.   °Sit on chairs with arms. Use the chair arms to help push yourself up when arising.  °Arrange for the use of a toilet seat elevator so you are not sitting low.  °· Walk with walker as instructed.  °You may resume a sexual relationship in one month or when given the OK by your caregiver.  °Use walker as long as suggested by your caregivers.  °You may put full weight on your legs and walk as much as is comfortable. °Avoid periods of inactivity such as sitting longer than an hour when not asleep. This helps prevent  blood clots.  °You may return to work once you are cleared by your surgeon.  °Do not drive a car for 6 weeks or until released by your surgeon.  °Do not drive while taking narcotics.  °Wear elastic stockings for two weeks following surgery during the day but you may remove then at night.  °Make sure you keep all of your appointments after your operation with all of your doctors and caregivers. You should call the office at the above phone number and make an appointment for approximately two weeks after the date of your surgery. °Please pick up a stool softener and laxative for home use as long as you are requiring pain medications. °· ICE to the affected hip every three hours for 30 minutes at a time and then as needed for pain and swelling. Continue to use ice on the hip for pain and swelling from surgery. You may notice swelling that will progress down to the foot and ankle.  This is normal after surgery.  Elevate the leg when you are not up walking on it.   °It is important for you to complete the blood thinner medication as prescribed by your doctor. °· Continue to use the breathing machine which will help keep your temperature down.  It is common for your temperature to cycle up and down following surgery, especially at night when you are not up moving around and exerting yourself.  The breathing machine keeps your lungs expanded and your temperature down. ° °RANGE OF MOTION AND STRENGTHENING EXERCISES  °These exercises are   designed to help you keep full movement of your hip joint. Follow your caregiver's or physical therapist's instructions. Perform all exercises about fifteen times, three times per day or as directed. Exercise both hips, even if you have had only one joint replacement. These exercises can be done on a training (exercise) mat, on the floor, on a table or on a bed. Use whatever works the best and is most comfortable for you. Use music or television while you are exercising so that the exercises  are a pleasant break in your day. This will make your life better with the exercises acting as a break in routine you can look forward to.  Lying on your back, slowly slide your foot toward your buttocks, raising your knee up off the floor. Then slowly slide your foot back down until your leg is straight again.  Lying on your back spread your legs as far apart as you can without causing discomfort.  Lying on your side, raise your upper leg and foot straight up from the floor as far as is comfortable. Slowly lower the leg and repeat.  Lying on your back, tighten up the muscle in the front of your thigh (quadriceps muscles). You can do this by keeping your leg straight and trying to raise your heel off the floor. This helps strengthen the largest muscle supporting your knee.  Lying on your back, tighten up the muscles of your buttocks both with the legs straight and with the knee bent at a comfortable angle while keeping your heel on the floor.   SKILLED REHAB INSTRUCTIONS: If the patient is transferred to a skilled rehab facility following release from the hospital, a list of the current medications will be sent to the facility for the patient to continue.  When discharged from the skilled rehab facility, please have the facility set up the patient's [place] prior to being released. Also, the skilled facility will be responsible for providing the patient with their medications at time of release from the facility to include their pain medication and their blood thinner medication. If the patient is still at the rehab facility at time of the two week follow up appointment, the skilled rehab facility will also need to assist the patient in arranging follow up appointment in our office and any transportation needs.  MAKE SURE YOU:  Understand these instructions.  Will watch your condition.  Will get help right away if you are not doing well or get worse.  Pick up stool softner and  laxative for home use following surgery while on pain medications. Do not remove your dressing. The dressing is waterproof--it is OK to take showers. Continue to use ice for pain and swelling after surgery. Do not use any lotions or creams on the incision until instructed by your surgeon. Total Hip Protocol.    Information on my medicine - XARELTO (Rivaroxaban)  This medication education was reviewed with me or my healthcare representative as part of my discharge preparation.  The pharmacist that spoke with me during my hospital stay was:  Saundra Shelling, [facility]  Why was Xarelto prescribed for you? Xarelto was prescribed for you to reduce the risk of a blood clot forming that can cause a stroke if you have a medical condition called atrial fibrillation (a type of irregular heartbeat).  What do you need to know about xarelto ? Take your Xarelto ONCE DAILY at the same time every day with your evening meal. If you have  difficulty swallowing the tablet whole, you may crush it and mix in applesauce just prior to taking your dose.  Take Xarelto exactly as prescribed by your doctor and DO NOT stop taking Xarelto without talking to the doctor who prescribed the medication.  Stopping without other stroke prevention medication to take the place of Xarelto may increase your risk of developing a clot that causes a stroke.  Refill your prescription before you run out.  After discharge, you should have regular check-up appointments with your healthcare provider that is prescribing your Xarelto.  In the future your dose may need to be changed if your kidney function or weight changes by a significant amount.  What do you do if you miss a dose? If you are taking Xarelto ONCE DAILY and you miss a dose, take it as soon as you remember on the same day then continue your regularly scheduled once daily regimen the next day. Do not take two doses of Xarelto at the same time or on the same day.    Important Safety Information A possible side effect of Xarelto is bleeding. You should call your healthcare provider right away if you experience any of the following: ? Bleeding from an injury or your nose that does not stop. ? Unusual colored urine (red or dark brown) or unusual colored stools (red or black). ? Unusual bruising for unknown reasons. ? A serious fall or if you hit your head (even if there is no bleeding).  Some medicines may interact with Xarelto and might increase your risk of bleeding while on Xarelto. To help avoid this, consult your healthcare provider or pharmacist prior to using any new prescription or non-prescription medications, including herbals, vitamins, non-steroidal anti-inflammatory drugs (NSAIDs) and supplements.  This website has more information on Xarelto: [email or web address]

## 2017-08-19 LAB — TYPE AND SCREEN
ABO/RH(D): O POS
ANTIBODY SCREEN: NEGATIVE
UNIT DIVISION: 0
Unit division: 0

## 2017-08-19 LAB — CBC
HCT: 25.1 % — ABNORMAL LOW (ref 39.0–52.0)
HEMOGLOBIN: 8.6 g/dL — AB (ref 13.0–17.0)
MCH: 30 pg (ref 26.0–34.0)
MCHC: 34.3 g/dL (ref 30.0–36.0)
MCV: 87.5 fL (ref 78.0–100.0)
PLATELETS: 120 10*3/uL — AB (ref 150–400)
RBC: 2.87 MIL/uL — AB (ref 4.22–5.81)
RDW: 15 % (ref 11.5–15.5)
WBC: 9.7 10*3/uL (ref 4.0–10.5)

## 2017-08-19 LAB — BPAM RBC
BLOOD PRODUCT EXPIRATION DATE: 201810242359
Blood Product Expiration Date: 201810242359
ISSUE DATE / TIME: 201810021045
ISSUE DATE / TIME: 201810021605
UNIT TYPE AND RH: 5100
Unit Type and Rh: 5100

## 2017-08-19 LAB — GLUCOSE, CAPILLARY
GLUCOSE-CAPILLARY: 112 mg/dL — AB (ref 65–99)
GLUCOSE-CAPILLARY: 119 mg/dL — AB (ref 65–99)
GLUCOSE-CAPILLARY: 127 mg/dL — AB (ref 65–99)
Glucose-Capillary: 118 mg/dL — ABNORMAL HIGH (ref 65–99)

## 2017-08-19 MED ORDER — RIVAROXABAN 10 MG PO TABS: 10.0000 mg | ORAL_TABLET | Freq: Every day | ORAL | Status: DC

## 2017-08-19 MED ORDER — TRAMADOL HCL 50 MG PO TABS: 50.0000 mg | ORAL_TABLET | Freq: Four times a day (QID) | ORAL | 0 refills | Status: DC | PRN

## 2017-08-19 NOTE — Progress Notes (Signed)
Family Medicine Teaching Service Daily Progress Note Intern Pager: [phone]  Patient name: [name] Medical record number: [id number] Date of birth: 1920/06/24 Age: 83 y.o. Gender: male  Primary Care Provider: Dickie La, MD Consultants: Orthopedics Code Status: full  Pt Overview and Major Events to Date:  [name] is a 83 year old male presenting with medical management for post-operative management for right total hip arthroplasty. Patient with hypotension overnight on POD#0. Required fluid boluses. Found to have post-op cbc of 6.6. Transfused 2 uprbc on 10/2. Responded appropriately with hgb of 8.6 on 10/4.  Assessment and Plan: Total Hip arthroplasty Patient is POD#2 for total hip arthroplasty. Dressing c/d/i with soft hip. No concern for compartment syndrome. Patient with hgb increase from 6.6 to 8.6 after receiving 2uprb on 10/2. Patient WBAT on R hip and is getting up with PT. Patient will need to be discharged to snf after stable for discharge. Currently getting home tramadol and tylenol for pain control, in a mild amount of pain today. Anticipate he will be medically ready for discharge on 10/4. Will plan to restart on night of 10/3. - vitals signs per floor routine - dilaudid 1mg  q 2 hours only for breakthrough - home tramadol and tylenol for pain - WBAT with walker - F/U PT/OT - cbc in am - incentive spirometer - will hold xarelto tonight  Anemia Patient with large decrease in hemoglobin from pre to post op hgb level. Transfused 2uprbc on 10/2 with hemoglobin increase to 8.6 from 6.6. Patient subjectively feeling better. Will get cbc in am of 10/4. If stable will be medically ready for dc to snf. - cbc in am - hold xarelto - transfuse 2 uprbc - consider ct scan if levels continue to fall - monitor vitals  Atrial fibrillation Recently seen by cardiology on 9/5 for these issues. Pacemaker interrogated at that appointment and showed normal device function. 89%  atrial pacing and 100% ventricular pacing. No episodes of afib. CHADSVasc score of 4. Takes Xarelto 15mg  at home for stroke ppx. Managed with dronedarone 400mg  tablet bid, and nebivolol 10mg  daily. Continue betablocker and dronedarone at home dose. Held xarelto overnight on 10/2. - continue dronedarone 400mg  tablet bid - continue nebivolol 10mg  daily - hold xarelto overnight  Sinus node dysfunction/av block/Pacemaker Patient with prolonged QT/qtc interval on past ekgs. Would avoid antiemetics unless necessary. Pacing would hopefully deter some if not all arrhythmias, but these drugs would best be avoided. - continue dronederone 400 mg twice a day - continue nebivolol 10 mgdaily - hold xarelto overnight - would avoid antiemetics if necessary - would choose Reglan or Compazine if necessary  Recurrent Small Bowel Obstruction Patient with history of small bowel obstruction requiring hospitalization. Patient with bowel movement overnight. - Carb modified regular diet - miralax 17g bid - would hold off on anti-emetics unless needed  Hypertension Patient with hypertension at baseline. Takes nebivolol, ramipril, torsemide. Holding ramipril and torsemide during admission due to risk for dehydration and aki in post-operative patient. If patient becomes hypertensive overnight can start prn hydralazine. Pain control will also play some aspect in decreasing hypertension. - monitor blood pressure with vital signs - continue nebivolol 10mg  - hold ramipril 2.5mg  - hold torsemide  CKD Stage III Patient with history of cr 1.35 and GFR 43 on bmp 5 months ago. At per-operative lab check on 9/24 patient with creatinine of 1.1 and GFR > 60 so this seems to have resolved. Will monitor these values with daily bmp  checks while admitted. Encourage PO intake, will keep on fluids until tolerating good PO. - daily bmp - hold ramipril, torsemide while inpatient  Type II Diabetes Patient with past diagnosis of  T2DM.Last A1c 5.8. Does not take any oral medication or insulin. Will monitor sugars while inpatient as post-inflammatory state could cause hyperglycemia. If sugars consistently elevated will consider Sliding scale.  - Monitor CBGs  FEN/GI: carb modified, miralax PPx: miralax  Disposition: snf  Subjective:  Doing well this morning. Eating breakfast with no complaints. Had bowel movement overnight. Says that he subjectively feels much stronger after getting units of blood.  Objective: Temp:  [97.5 F (36.4 C)-98.4 F (36.9 C)] 97.5 F (36.4 C) (10/03 1327) Pulse Rate:  [70-71] 70 (10/03 1327) Resp:  [18] 18 (10/03 1327) BP: (118-141)/(42-89) 118/42 (10/03 1327) SpO2:  [97 %-100 %] 99 % (10/03 1327) Physical Exam: General: well-appearing, resting comfortably, eating breakfast, no acute distress, alert, oriented Cardiovascular: regular rate and rhythm, palpable pulses BLE Respiratory: lungs cleat to auscultation bilaterally, no chest pain, no tachypnea, no increased work of breathing Abdomen: soft, non-tender, non-distended Extremities: right hip soft, non-tender, no signs of developing hematoma, incision c/d/i  Laboratory:  Recent Labs Lab 08/18/17 0545 08/18/17 2040 08/19/17 0438  WBC 9.8 10.2 9.7  HGB 6.6* 8.8* 8.6*  HCT 19.8* 25.8* 25.1*  PLT 129* 123* 120*    Recent Labs Lab 08/18/17 0344  NA 131*  K 4.0  CL 101  CO2 23  BUN 23*  CREATININE 1.30*  CALCIUM 7.6*  GLUCOSE 175*     Imaging/Diagnostic Tests: no new imaging   Guadalupe Dawn, MD 08/19/2017, 3:17 PM PGY-1, [facility] Intern pager: [phone], text pages welcome

## 2017-08-19 NOTE — Progress Notes (Signed)
Physical Therapy Treatment Patient Details Name: [name] MRN: [id number] DOB: [birth year] Today's Date: 08/19/2017    History of Present Illness Pt is a 83 year old man admitted for elective R THA (direct anterior approach). PMH includes L posterior THA, HTN, DM, afib which converted, gout.   PT Comments    Pt progressing with mobility; remains pleasant and motivated to participate with therapy. Able to transfer and take steps from bed to chair with RW and min-modA+2 for balance and safety. Pt with posterior lean in sitting and standing, potentially secondary to fear of falling. Reports feeling better overall this session and denies dizziness. Educ on seated therex; will plan to progress therex next session. Will continue to follow acutely.   Follow Up Recommendations  SNF;Supervision/Assistance - 24 hour     Equipment Recommendations  Other (comment) (defer to next venue)    Recommendations for Other Services       Precautions / Restrictions Precautions Precautions: Fall Restrictions Weight Bearing Restrictions: Yes RLE Weight Bearing: Weight bearing as tolerated    Mobility  Bed Mobility Overal bed mobility: Needs Assistance Bed Mobility: Supine to Sit     Supine to sit: Max assist     General bed mobility comments: Pt requiring increased time for bed mobility secondary to R hip soreness, eventually requiring maxA to bring BLEs to EOB and scoot hips forward  Transfers Overall transfer level: Needs assistance Equipment used: Rolling walker (2 wheeled) Transfers: Sit to/from Stand Sit to Stand: +2 physical assistance;Mod assist         General transfer comment: Cues for technique with RW; modA+2 to assist trunk elevation and balance. Multimodal cues for bilateral hip extension and upright posture, as pt has significant posterior lean in standing  Ambulation/Gait Ambulation/Gait assistance: Min assist;+2 physical assistance Ambulation Distance (Feet): 3  Feet Assistive device: Rolling walker (2 wheeled) Gait Pattern/deviations: Step-to pattern;Antalgic;Leaning posteriorly Gait velocity: Decreased Gait velocity interpretation: <1.8 ft/sec, indicative of risk for recurrent falls General Gait Details: Took steps towards chair with RW and minA+2 for balance and to maintain upright posture as pt tends to lean posteriorly   Stairs            Wheelchair Mobility    Modified Rankin (Stroke Patients Only)       Balance Overall balance assessment: Needs assistance Sitting-balance support: No upper extremity supported;Feet supported Sitting balance-Leahy Scale: Fair Sitting balance - Comments: Cues to lean forward for balance Postural control: Posterior lean Standing balance support: Bilateral upper extremity supported Standing balance-Leahy Scale: Poor Standing balance comment: Reliant on BUE support and assist for standing                            Cognition Arousal/Alertness: Awake/alert Behavior During Therapy: WFL for tasks assessed/performed Overall Cognitive Status: Within Functional Limits for tasks assessed                                 General Comments: HOH      Exercises Total Joint Exercises Ankle Circles/Pumps: AROM;Both;10 reps;Seated Quad Sets: AROM;Both;10 reps;Seated Straight Leg Raises: AAROM;Right;5 reps;Seated    General Comments        Pertinent Vitals/Pain Pain Assessment: Faces Faces Pain Scale: Hurts a little bit Pain Location: R hip Pain Descriptors / Indicators: Sore;Discomfort Pain Intervention(s): Monitored during session;Repositioned    Home Living  Prior Function            PT Goals (current goals can now be found in the care plan section) Acute Rehab PT Goals Patient Stated Goal: to go to rehab and then decide if he needs ALF PT Goal Formulation: With patient Time For Goal Achievement: 09/01/17 Potential to Achieve Goals:  Good Progress towards PT goals: Progressing toward goals    Frequency    7X/week      PT Plan Current plan remains appropriate    Co-evaluation              AM-PAC PT "6 Clicks" Daily Activity  Outcome Measure  Difficulty turning over in bed (including adjusting bedclothes, sheets and blankets)?: Unable Difficulty moving from lying on back to sitting on the side of the bed? : Unable Difficulty sitting down on and standing up from a chair with arms (e.g., wheelchair, bedside commode, etc,.)?: Unable Help needed moving to and from a bed to chair (including a wheelchair)?: A Little Help needed walking in hospital room?: A Little Help needed climbing 3-5 steps with a railing? : A Lot 6 Click Score: 11    End of Session Equipment Utilized During Treatment: Gait belt Activity Tolerance: Patient tolerated treatment well;Patient limited by fatigue Patient left: in chair;with call bell/phone within reach Nurse Communication: Mobility status PT Visit Diagnosis: Other abnormalities of gait and mobility (R26.89);Pain Pain - Right/Left: Right Pain - part of body: Hip     Time: 9150-5697 PT Time Calculation (min) (ACUTE ONLY): 13 min  Charges:  $Therapeutic Activity: 8-22 mins                    G Codes:      Mabeline Caras, PT, DPT Acute Rehab Services  Pager: [phone] 08/19/2017, 1:12 PM

## 2017-08-19 NOTE — Progress Notes (Signed)
   Subjective:  Patient reports pain as mild to moderate.  Denies N/V/CP/SOB.  Objective:   VITALS:   Vitals:   08/18/17 1635 08/18/17 1850 08/18/17 2202 08/19/17 0546  BP: (!) 122/57 131/89 (!) 141/56 139/61  Pulse: 70 71 71 70  Resp: 18 18 18 18   Temp: 98.1 F (36.7 C) 98.4 F (36.9 C) 98.1 F (36.7 C) 97.6 F (36.4 C)  TempSrc: Oral Oral Oral Oral  SpO2: 97% 97% 100% 98%  Weight:      Height:        NAD ABD soft Sensation intact distally Intact pulses distally Dorsiflexion/Plantar flexion intact Incision: dressing C/D/I Compartment soft Thigh mildly swollen, no hematoma - unchanged   Lab Results  Component Value Date   WBC 9.7 08/19/2017   HGB 8.6 (L) 08/19/2017   HCT 25.1 (L) 08/19/2017   MCV 87.5 08/19/2017   PLT 120 (L) 08/19/2017   BMET    Component Value Date/Time   NA 131 (L) 08/18/2017 0344   NA 131 (L) 11/19/2016 0000   K 4.0 08/18/2017 0344   CL 101 08/18/2017 0344   CO2 23 08/18/2017 0344   GLUCOSE 175 (H) 08/18/2017 0344   BUN 23 (H) 08/18/2017 0344   BUN 23 11/19/2016 0000   CREATININE 1.30 (H) 08/18/2017 0344   CREATININE 1.16 (H) 01/28/2017 1111   CALCIUM 7.6 (L) 08/18/2017 0344   GFRNONAA 45 (L) 08/18/2017 0344   GFRAA 52 (L) 08/18/2017 0344     Assessment/Plan: 2 Days Post-Op   Principal Problem:   Osteoarthritis of right hip Active Problems:   Elective surgery   WBAT with walker DVT ppx: xarelto - resume 10 mg today, SCDs, TEDs ABLA: hgb 8.6 today, received transfuse 2 units PRBCs yesterday with appropriate response, consider CT angio of pelvis if hgb drops again PT/OT Dispo: recheck hgb in am - if stable then ok for d/c, D/C planning   Judah Carchi, Horald Pollen 08/19/2017, 7:44 AM   Rod Can, MD Cell [phone]

## 2017-08-20 ENCOUNTER — Telehealth: Payer: Self-pay | Admitting: Family Medicine

## 2017-08-20 DIAGNOSIS — Z09 Encounter for follow-up examination after completed treatment for conditions other than malignant neoplasm: Secondary | ICD-10-CM

## 2017-08-20 LAB — CBC
HEMATOCRIT: 24.2 % — AB (ref 39.0–52.0)
Hemoglobin: 8.5 g/dL — ABNORMAL LOW (ref 13.0–17.0)
MCH: 30.9 pg (ref 26.0–34.0)
MCHC: 35.1 g/dL (ref 30.0–36.0)
MCV: 88 fL (ref 78.0–100.0)
Platelets: 129 10*3/uL — ABNORMAL LOW (ref 150–400)
RBC: 2.75 MIL/uL — ABNORMAL LOW (ref 4.22–5.81)
RDW: 15.4 % (ref 11.5–15.5)
WBC: 7.7 10*3/uL (ref 4.0–10.5)

## 2017-08-20 LAB — GLUCOSE, CAPILLARY
GLUCOSE-CAPILLARY: 125 mg/dL — AB (ref 65–99)
Glucose-Capillary: 99 mg/dL (ref 65–99)
Glucose-Capillary: 114 mg/dL — ABNORMAL HIGH (ref 65–99)

## 2017-08-20 MED ORDER — TRAMADOL HCL 50 MG PO TABS: 50.0000 mg | ORAL_TABLET | Freq: Four times a day (QID) | ORAL | 0 refills | Status: DC | PRN

## 2017-08-20 MED ORDER — HYDROCORTISONE ACE-PRAMOXINE 1-1 % RE FOAM: 1.0000 | Freq: Two times a day (BID) | RECTAL | 3 refills | Status: DC

## 2017-08-20 MED ORDER — RIVAROXABAN 10 MG PO TABS
10.0000 mg | ORAL_TABLET | Freq: Every day | ORAL | Status: DC
  Administered 2017-08-20: 10 mg via ORAL
  Filled 2017-08-20: qty 1

## 2017-08-20 MED ORDER — RIVAROXABAN 10 MG PO TABS: 10.0000 mg | ORAL_TABLET | Freq: Every day | ORAL | 0 refills | Status: DC

## 2017-08-20 NOTE — Progress Notes (Signed)
Physical Therapy Treatment Patient Details Name: [name] MRN: [id number] DOB: 1920/11/16 Today's Date: 08/20/2017    History of Present Illness Pt is a 83 year old man admitted for elective R THA (direct anterior approach). PMH includes L posterior THA, HTN, DM, afib which converted, gout.    PT Comments    Pt performed increased activity including progression to short gait trial and therapeutic exercises in seated position.  Pt awaiting transport from [facility] for further rehab at SNF.     Follow Up Recommendations  SNF;Supervision/Assistance - 24 hour     Equipment Recommendations  Other (comment) (defer to next level of care.  )    Recommendations for Other Services       Precautions / Restrictions Precautions Precautions: Fall Restrictions Weight Bearing Restrictions: Yes RLE Weight Bearing: Weight bearing as tolerated    Mobility  Bed Mobility Overal bed mobility: Needs Assistance Bed Mobility: Supine to Sit     Supine to sit: Max assist Sit to supine: +2 for physical assistance;Max assist   General bed mobility comments: up in chair  Transfers Overall transfer level: Needs assistance Equipment used: Rolling walker (2 wheeled) Transfers: Sit to/from Stand Sit to Stand: Mod assist         General transfer comment: cues to scoot forward further prior to standing. Assist to power up and maintain balance. From recliner.  Ambulation/Gait Ambulation/Gait assistance: Min assist Ambulation Distance (Feet): 8 Feet Assistive device: Rolling walker (2 wheeled) Gait Pattern/deviations: Step-to pattern;Decreased stride length;Shuffle;Trunk flexed;Narrow base of support Gait velocity: Decreased   General Gait Details: Cues for upper trunk control, increasing BOS and increasing stride length.  Pt fatigues quickly with mobility.     Stairs            Wheelchair Mobility    Modified Rankin (Stroke Patients Only)       Balance Overall balance  assessment: Needs assistance   Sitting balance-Leahy Scale: Fair Sitting balance - Comments: Cues to lean forward for balance     Standing balance-Leahy Scale: Poor Standing balance comment: Reliant on BUE support and assist for standing                            Cognition Arousal/Alertness: Awake/alert Behavior During Therapy: WFL for tasks assessed/performed Overall Cognitive Status: Within Functional Limits for tasks assessed                                 General Comments: HOH      Exercises Total Joint Exercises Ankle Circles/Pumps: AROM;Both;10 reps;Seated Quad Sets: AROM;10 reps;Seated;Right Heel Slides: AAROM;Right;10 reps;Supine Hip ABduction/ADduction: AAROM;Right;10 reps;Supine Long Arc Quad: AAROM;Right;10 reps;Supine    General Comments        Pertinent Vitals/Pain Pain Assessment: 0-10 Pain Score: 5  Pain Location: R hip Pain Descriptors / Indicators: Sore;Discomfort Pain Intervention(s): Monitored during session;Repositioned    Home Living                      Prior Function            PT Goals (current goals can now be found in the care plan section) Acute Rehab PT Goals Patient Stated Goal: to go to rehab and then decide if he needs ALF Potential to Achieve Goals: Good Progress towards PT goals: Progressing toward goals    Frequency    7X/week  PT Plan Current plan remains appropriate    Co-evaluation              AM-PAC PT "6 Clicks" Daily Activity  Outcome Measure  Difficulty turning over in bed (including adjusting bedclothes, sheets and blankets)?: Unable Difficulty moving from lying on back to sitting on the side of the bed? : Unable Difficulty sitting down on and standing up from a chair with arms (e.g., wheelchair, bedside commode, etc,.)?: Unable Help needed moving to and from a bed to chair (including a wheelchair)?: A Lot Help needed walking in hospital room?: A Lot Help  needed climbing 3-5 steps with a railing? : A Lot 6 Click Score: 9    End of Session Equipment Utilized During Treatment: Gait belt Activity Tolerance: Patient tolerated treatment well;Patient limited by fatigue Patient left: in chair;with call bell/phone within reach Nurse Communication: Mobility status PT Visit Diagnosis: Other abnormalities of gait and mobility (R26.89);Pain Pain - Right/Left: Right Pain - part of body: Hip     Time: 9678-9381 PT Time Calculation (min) (ACUTE ONLY): 15 min  Charges:  $Therapeutic Activity: 8-22 mins                    G Codes:       Governor Rooks, PTA pager [phone]    Cristela Blue 08/20/2017, 5:12 PM

## 2017-08-20 NOTE — Progress Notes (Signed)
Pt has an order to use overhead frame and bar with trapeze,the nurse reached the  ortho tech and  replied that they ruled out due to his age and his physical conditions.

## 2017-08-20 NOTE — Progress Notes (Signed)
Family Medicine Teaching Service Daily Progress Note Intern Pager: [phone]  Patient name: [name] Medical record number: [id number] Date of birth: 02/02/1920 Age: 83 y.o. Gender: male  Primary Care Provider: Dickie La, MD Consultants: Orthopedics Code Status: full  Pt Overview and Major Events to Date:  [name] is a 83 year old male presenting with medical management for post-operative management for right total hip arthroplasty. Patient with hypotension overnight on POD#0. Required fluid boluses. Found to have post-op cbc of 6.6. Transfused 2 uprbc on 10/2. Responded appropriately with hgb of 8.6 on 10/3. Stable hemoglobin of 8.5 on 10/4.  Assessment and Plan: Total Hip arthroplasty Patient is POD#3 for total hip arthroplasty. Dressing c/d/i with soft hip. No concern for compartment syndrome. Patient with hgb increase from 6.6 to 8.6 after receiving 2uprb on 10/2. Hemoglobin 8.5 10/4. Patient WBAT on R hip and is getting up with PT. Patient will need to be discharged to snf after stable for discharge. Currently getting home tramadol and tylenol for pain control, in a mild amount of pain today. Anticipate he will be medically ready for discharge on 10/4. Will discharge on 10mg  of xarelto for 35 days. There is a question if he even needs his xarelto for afib ppx anymore so a 15mg  dose is not indicated. After 35 day course will have his pcp evaluate him and determine need for continued anticoagulation. - vitals signs per floor routine - ok for dc today, will go to snf - d/c with home tramadol and tylenol for pain - home tramadol and tylenol for pain - WBAT with walker  - incentive spirometer - start xarelto at 10mg  and continue this dose for 35 days  Anemia Patient with large decrease in hemoglobin from pre to post op hgb level. Transfused 2uprbc on 10/2 with hemoglobin increase to 8.6 from 6.6. Patient subjectively feeling better. hgb stable from 8.6 to 8.5 today. - will  restart xarelto today - monitor vitals - d/c when able  Atrial fibrillation Recently seen by cardiology on 9/5 for these issues. Pacemaker interrogated at that appointment and showed normal device function. 89% atrial pacing and 100% ventricular pacing. No episodes of afib. CHADSVasc score of 4. Takes Xarelto 15mg  at home for stroke ppx. Managed with dronedarone 400mg  tablet bid, and nebivolol 10mg  daily. Continue betablocker and dronedarone at home dose. Held xarelto overnight on 10/2 and 10/3. - continue dronedarone 400mg  tablet bid - continue nebivolol 10mg  daily - restart xarelto 10mg   Sinus node dysfunction/av block/Pacemaker Patient with prolonged QT/qtc interval on past ekgs. Would avoid antiemetics unless necessary. Pacing would hopefully deter some if not all arrhythmias, but these drugs would best be avoided. - continue dronederone 400 mg twice a day - continue nebivolol 10 mgdaily - restart xarelto 10mg  - would avoid antiemetics if necessary - would choose Reglan or Compazine if necessary  Recurrent Small Bowel Obstruction Patient with history of small bowel obstruction requiring hospitalization. Patient with bowel movement overnight. - Carb modified regular diet - miralax 17g bid - would hold off on anti-emetics unless needed  Hypertension Patient with hypertension at baseline. Takes nebivolol, ramipril, torsemide. Holding ramipril and torsemide during admission due to risk for dehydration and aki in post-operative patient. If patient becomes hypertensive overnight can start prn hydralazine. Pain control will also play some aspect in decreasing hypertension. - monitor blood pressure with vital signs - continue nebivolol 10mg  - hold ramipril 2.5mg  - hold torsemide  CKD Stage III Patient with history of  cr 1.35 and GFR 43 on bmp 5 months ago. At per-operative lab check on 9/24 patient with creatinine of 1.1 and GFR > 60 so this seems to have resolved. Will monitor these  values with daily bmp checks while admitted. Encourage PO intake, will keep on fluids until tolerating good PO. - daily bmp - hold ramipril, torsemide while inpatient  Type II Diabetes Patient with past diagnosis of T2DM.Last A1c 5.8. Does not take any oral medication or insulin. Will monitor sugars while inpatient as post-inflammatory state could cause hyperglycemia. If sugars consistently elevated will consider Sliding scale.  - Monitor CBGs  FEN/GI: carb modified, miralax PPx: miralax  Disposition: snf  Subjective:  Doing really well overnight. Tolerating good po. Pain controlled. Ready for dc today  Objective: Temp:  [97.5 F (36.4 C)-98.2 F (36.8 C)] 98.2 F (36.8 C) (10/04 0514) Pulse Rate:  [70-72] 70 (10/04 0514) Resp:  [18-19] 18 (10/04 0514) BP: (118-123)/(42-50) 123/50 (10/04 0514) SpO2:  [99 %-100 %] 99 % (10/04 0514) Physical Exam: General: well-appearing, resting comfortably, eating breakfast, no acute distress, alert, oriented Cardiovascular: regular rate and rhythm, palpable pulses BLE Respiratory: lungs cleat to auscultation bilaterally, no chest pain, no tachypnea, no increased work of breathing Abdomen: soft, non-tender, non-distended Extremities: right hip soft, non-tender, no signs of developing hematoma, incision c/d/i  Laboratory:  Recent Labs Lab 08/18/17 2040 08/19/17 0438 08/20/17 0354  WBC 10.2 9.7 7.7  HGB 8.8* 8.6* 8.5*  HCT 25.8* 25.1* 24.2*  PLT 123* 120* 129*    Recent Labs Lab 08/18/17 0344  NA 131*  K 4.0  CL 101  CO2 23  BUN 23*  CREATININE 1.30*  CALCIUM 7.6*  GLUCOSE 175*     Imaging/Diagnostic Tests: no new imaging   Samuel Dawn, MD 08/20/2017, 12:10 PM PGY-1, [place] Intern pager: [phone], text pages welcome

## 2017-08-20 NOTE — Progress Notes (Signed)
Patient will discharge to [facility] Anticipated discharge date: 10/4 Family notified: [place] by [employer]- called at 3:30pm  [place] signing off.  Jorge Ny, LCSW Clinical Social Worker [phone]

## 2017-08-20 NOTE — Clinical Social Work Note (Signed)
Clinical Social Work Assessment  Patient Details  Name: [name] MRN: [id number] Date of Birth: 03/13/1920  Date of referral:  08/19/17               Reason for consult:  Facility Placement                Permission sought to share information with:  Facility Sport and exercise psychologist, Family Supports Permission granted to share information::  Yes, Verbal Permission Granted  Name::     Sports coach::  SNF-[facility]  Relationship::  Niece  Contact Information:     Housing/Transportation Living arrangements for the past 2 months:  Single Family Home Source of Information:  Patient Patient Interpreter Needed:  None Criminal Activity/Legal Involvement Pertinent to Current Situation/Hospitalization:  No - Comment as needed Significant Relationships:  Siblings, Other Family Members Lives with:  Self Do you feel safe going back to the place where you live?  Yes Need for family participation in patient care:  No (Coment)  Care giving concerns:  Patient has been living at home alone, but would benefit from short term rehab prior to returning home in order to successfully care for self. Patient would prefer [facility] as his brother is currently residing there, as well.   Social Worker assessment / plan:  CSW met with patient to confirm plan to admit to [facility] at discharge. CSW coordinated with facility representative to confirm that patient could admit to [facility] when medically ready. CSW to coordinate discharge to [facility] when ready.   Employment status:  Retired Forensic scientist:  Medicare PT Recommendations:  [place] / Referral to community resources:  [place]  Patient/Family's Response to care:  Patient agreeable to SNF placement, with preference for [facility].  Patient/Family's Understanding of and Emotional Response to Diagnosis, Current Treatment, and Prognosis:  Patient was appreciative of CSW assistance and happy that [facility] would be  able to take him for rehab. Patient is hopeful that he will be able to improve quickly and return home.  Emotional Assessment Appearance:  Appears stated age Attitude/Demeanor/Rapport:    Affect (typically observed):  Appropriate Orientation:  Oriented to Self, Oriented to Place, Oriented to  Time, Oriented to Situation Alcohol / Substance use:  Not Applicable Psych involvement (Current and /or in the community):  No (Comment)  Discharge Needs  Concerns to be addressed:  Care Coordination Readmission within the last 30 days:  No Current discharge risk:  Lives alone, Physical Impairment Barriers to Discharge:  Continued Medical Work up, [place], [place] 08/20/2017, 10:53 AM

## 2017-08-20 NOTE — Telephone Encounter (Signed)
Dear [name] Team Can you call his daughter in law [name] and let her knw I sent in some hemorrhoid cream for him to [facility]---I forgot to have it added to his med list for the rehab facility. THey can pick it up and just take it to him [facility]! [name]

## 2017-08-20 NOTE — NC FL2 (Signed)
[place] LEVEL OF CARE SCREENING TOOL     IDENTIFICATION  Patient Name: [name] Birthdate: [birth year] Sex: male Admission Date (Current Location): 08/17/2017  [facility] and [place] Number:  Herbalist and Address:  [facility] [place] [address] [place], [place] [address]      Provider Number: [id number]  Attending Physician Name and Address:  Dickie La, MD  Relative Name and Phone Number:       Current Level of Care: Hospital Recommended Level of Care: [place] Prior Approval Number:    Date Approved/Denied:   PASRR Number: [id number]  Discharge Plan: SNF    Current Diagnoses: Patient Active Problem List   Diagnosis Date Noted  . Osteoarthritis of right hip 08/17/2017  . Elective surgery   . Normocytic anemia   . Pressure injury of skin 03/15/2017  . SBO (small bowel obstruction) ([place]) 03/14/2017  . Essential hypertension   . Small bowel obstruction ([place])   . Second degree AV block 11/29/2014  . Abnormal esophagram 09/07/2014  . Pacemaker - Medtronic Adapta 2006, gen change 2015 07/23/2013  . Chronic right hip pain 09/22/2012  . Testicular swelling, right 09/22/2012  . Presbycusis 05/14/2012  . Long term current use of anticoagulant 01/30/2012  . Paroxysmal atrial fibrillation ([place]) 01/30/2012  . Hx SBO 01/30/2012  . S/P appendectomy 01/30/2012  . CHRONIC KIDNEY DISEASE STAGE III (MODERATE) 12/03/2010  . ALLERGIC RHINITIS 09/05/2009  . HOARSENESS 05/15/2009  . HIATAL HERNIA 12/14/2008  . Diverticulosis of large intestine 12/14/2008  . COLONIC POLYPS, HYPERPLASTIC, HX OF 12/14/2008  . ESOPHAGEAL STRICTURE 10/04/2008  . Other vitamin B12 deficiency anemia 01/26/2008  . GLAUCOMA 01/14/2007  . CATARACT 01/14/2007  . SICK SINUS SYNDROME 01/14/2007  . GASTROESOPHAGEAL REFLUX, NO ESOPHAGITIS 01/14/2007    Orientation RESPIRATION BLADDER Height & Weight     Time, Self, Situation, Place  Normal External catheter (placed 08/17/17) Weight: 178 lb (80.7 kg) Height:  5\' 6"  (167.6 cm)  BEHAVIORAL SYMPTOMS/MOOD NEUROLOGICAL BOWEL NUTRITION STATUS       (unknown) Diet (carb modified)  AMBULATORY STATUS COMMUNICATION OF NEEDS Skin   Extensive Assist Verbally Surgical wounds (closed right hip (08/17/17), adhesive bandage dressing)                       Personal Care Assistance Level of Assistance  Bathing, Dressing Bathing Assistance: Maximum assistance   Dressing Assistance: Maximum assistance     Functional Limitations Info             [place]  PT (By licensed PT), OT (By licensed OT)     PT Frequency: 5x/wk OT Frequency: 5x/wk            Contractures      Additional Factors Info  Code Status, Allergies Code Status Info: Full Allergies Info: [place]           Current Medications (08/20/2017):  This is the current hospital active medication list Current Facility-Administered Medications  Medication Dose Route Frequency Provider Last Rate Last Dose  . acetaminophen (TYLENOL) tablet 650 mg  650 mg Oral Q6H PRN Swinteck, Aaron Edelman, MD       Or  . acetaminophen (TYLENOL) suppository 650 mg  650 mg Rectal Q6H PRN Swinteck, Aaron Edelman, MD      . docusate sodium (COLACE) capsule 100 mg  100 mg Oral BID Rod Can, MD   100 mg at 08/20/17 0903  .  dronedarone (MULTAQ) tablet 400 mg  400 mg Oral BID WC Rod Can, MD   400 mg at 08/20/17 0902  . HYDROmorphone (DILAUDID) injection 0.5-1 mg  0.5-1 mg Intravenous Q2H PRN Swinteck, Aaron Edelman, MD      . insulin aspart (novoLOG) injection 0-15 Units  0-15 Units Subcutaneous TID WC Rod Can, MD   3 Units at 08/18/17 1744  . menthol-cetylpyridinium (CEPACOL) lozenge 3 mg  1 lozenge Oral PRN Swinteck, Aaron Edelman, MD       Or  . phenol (CHLORASEPTIC) mouth spray 1 spray  1 spray Mouth/Throat PRN Swinteck, Aaron Edelman, MD      . methocarbamol (ROBAXIN) tablet 500 mg  500 mg Oral Q6H PRN Swinteck, Aaron Edelman,  MD       Or  . methocarbamol (ROBAXIN) 500 mg in dextrose 5 % 50 mL IVPB  500 mg Intravenous Q6H PRN Swinteck, Aaron Edelman, MD      . metoCLOPramide (REGLAN) tablet 5-10 mg  5-10 mg Oral Q8H PRN Swinteck, Aaron Edelman, MD       Or  . metoCLOPramide (REGLAN) injection 5-10 mg  5-10 mg Intravenous Q8H PRN Swinteck, Aaron Edelman, MD      . nebivolol (BYSTOLIC) tablet 10 mg  10 mg Oral Daily Rod Can, MD   10 mg at 08/20/17 0904  . ondansetron (ZOFRAN) tablet 4 mg  4 mg Oral Q6H PRN Swinteck, Aaron Edelman, MD       Or  . ondansetron (ZOFRAN) injection 4 mg  4 mg Intravenous Q6H PRN Swinteck, Aaron Edelman, MD      . pantoprazole (PROTONIX) EC tablet 40 mg  40 mg Oral QAC breakfast Rod Can, MD   40 mg at 08/20/17 0903  . polyethylene glycol (MIRALAX / GLYCOLAX) packet 17 g  17 g Oral BID Rod Can, MD   17 g at 08/20/17 0905  . psyllium (HYDROCIL/METAMUCIL) packet 1 packet  1 packet Oral BID Rod Can, MD   1 packet at 08/20/17 [phone]  . senna (SENOKOT) tablet 17.2 mg  2 tablet Oral QHS Rod Can, MD   17.2 mg at 08/19/17 2138  . tamsulosin (FLOMAX) capsule 0.4 mg  0.4 mg Oral QPC supper Rod Can, MD   0.4 mg at 08/19/17 1727  . traMADol (ULTRAM) tablet 50 mg  50 mg Oral Q12H Mercy Riding, MD   50 mg at 08/20/17 5638     Discharge Medications: Please see discharge summary for a list of discharge medications.  Relevant Imaging Results:  Relevant Lab Results:   Additional Information SS#: [id number]  Jorge Ny, LCSW

## 2017-08-20 NOTE — Progress Notes (Signed)
Patient discharged per MD order.  IV removed with no complications.  Family member picked up all belongings prior to transport. Patient being transported via [facility] to [facility].  AVS and written prescriptions provided to transporters.  Report to given to nurse at receiving facility.

## 2017-08-20 NOTE — Discharge Summary (Signed)
[facility] Discharge Summary  Patient name: [name] Medical record number: [id number] Date of birth: [birth year] Age: 83 y.o. Gender: male Date of Admission: 08/17/2017  Date of Discharge: 08/20/17  Admitting Physician: Rod Can, MD  Primary Care Provider: Dickie La, MD Consultants: Orthopedics  Indication for Hospitalization:   Discharge Diagnoses/Problem List:  Right total hip arthroplasty Atrial fibrillation Hypertension Acute blood loss anemia CK D stage III Type 2 diabetes (well controlled)  Disposition: SNF  Discharge Condition: Stable  Discharge Exam:  General: well-appearing, resting comfortably, eating breakfast, no acute distress, alert, oriented Cardiovascular: regular rate and rhythm, palpable pulses BLE Respiratory: lungs cleat to auscultation bilaterally, no chest pain, no tachypnea, no increased work of breathing Abdomen: soft, non-tender, non-distended Extremities: right hip soft, non-tender, no signs of developing hematoma, incision c/d/i Physical exam was performed by Dr. Guadalupe Dawn  Brief Hospital Course:  [name] is a 83 year old male who was admitted to family medicine teaching service after successful right total hip arthroplasty.  Right total hip arthroplasty: Dressing was clean dry and intact. He is WBAT. He was evaluated and treated by physical therapy. Patient will be discharged on Xarelto 10 mg to 35 days. He is also discharged on tramadol and Tylenol for pain control. Patient to schedule follow-up with orthopedics in 2 weeks for wound check.  Acute blood loss anemia: Patient had acute blood loss anemia after after hip surgery. Her hemoglobin dropped from 12 (preoperative) to 6.5 postoperative. He was given 2 units of packed red blood cells with appropriate response in his hemoglobin to 8.8. His Xarelto was held for 2 days. Hemoglobin remained stable at 8.5. Xarelto was resumed at reduced dose  (10 mg). Patient was discharged on Darvocet-N 10 mg at 35 days. PCP to discuss about the long term plan about anticoagulation at his hospital follow-up given his H and his bleeding risk.   Atrial fibrillation: Rhythm controlled by dronedarone. He is on reduced dose of Xarelto 10 mg for anticoagulation given risk of bleeding. Long-term anticoagulation may need to be discussed at follow-up with PCP given his risk of bleeding.  His other chronic medical conditions are stable   Issues for Follow Up:  1. Right total hip arthroplasty: WBAT. Follow up with orthopedics for wound check in 2 weeks. Recommend scheduling this early.  2. Acute blood loss anemia: Hemoglobin stable at 8.5 at discharge. Recommend checking CBC in a week  3.  Atrial fibrillation: on reduced dose of Xarelto 10 mg daily given his risk of bleeding. Recommend discussion about long-term anticoagulation given risk of bleeding at follow-up with PCP  Significant Procedures:  Right total hip arthroplasty   Blood transfusion  Significant Labs and Imaging:   Recent Labs Lab 08/18/17 2040 08/19/17 0438 08/20/17 0354  WBC 10.2 9.7 7.7  HGB 8.8* 8.6* 8.5*  HCT 25.8* 25.1* 24.2*  PLT 123* 120* 129*    Recent Labs Lab 08/18/17 0344  NA 131*  K 4.0  CL 101  CO2 23  GLUCOSE 175*  BUN 23*  CREATININE 1.30*  CALCIUM 7.6*    Results/Tests Pending at Time of Discharge: None  Discharge Medications:  Allergies as of 08/20/2017      Reactions   Codeine Nausea Only      Medication List    TAKE these medications   beta carotene w/minerals tablet Take 1 tablet by mouth daily.   dronedarone 400 MG tablet Commonly known as:  MULTAQ Take 1 tablet (400 mg  total) by mouth 2 (two) times daily with a meal.   glucose blood test strip Use as instructed   Lancets Misc Use with glucometer as directed   nebivolol 10 MG tablet Commonly known as:  BYSTOLIC Take 1 tablet (10 mg total) by mouth daily.   ONE TOUCH ULTRA SYSTEM  KIT w/Device Kit 1 kit by Does not apply route once.   pantoprazole 40 MG tablet Commonly known as:  PROTONIX Take 1 tablet (40 mg total) by mouth daily before breakfast.   polyethylene glycol packet Commonly known as:  MIRALAX / GLYCOLAX Take 17 g by mouth daily. What changed:  when to take this  additional instructions   PROBIOTIC PO Take 1 tablet by mouth daily.   psyllium 95 % Pack Commonly known as:  HYDROCIL/METAMUCIL Take 1 packet by mouth 2 (two) times daily.   ramipril 2.5 MG capsule Commonly known as:  ALTACE Take 1 capsule (2.5 mg total) by mouth daily.   rivaroxaban 10 MG Tabs tablet Commonly known as:  XARELTO Take 1 tablet (10 mg total) by mouth daily. What changed:  medication strength  how much to take  when to take this   silodosin 4 MG Caps capsule Commonly known as:  RAPAFLO Take 2 capsules (8 mg total) by mouth daily with breakfast. What changed:  when to take this   SYSTANE FREE OP Place 1 drop into both eyes 2 (two) times daily as needed (dry eyes). For dry eyes   torsemide 10 MG tablet Commonly known as:  DEMADEX Take 1 tablet (10 mg total) by mouth 2 (two) times a week. Mondays and Fridays.   traMADol 50 MG tablet Commonly known as:  ULTRAM Take 1-2 tablets (50-100 mg total) by mouth every 6 (six) hours as needed. What changed:  how much to take  how to take this  when to take this  reasons to take this  additional instructions   vitamin B-12 500 MCG tablet Commonly known as:  CYANOCOBALAMIN Take 500 mcg by mouth daily.       Discharge Instructions: Please refer to Patient Instructions section of EMR for full details.  Patient was counseled important signs and symptoms that should prompt return to medical care, changes in medications, dietary instructions, activity restrictions, and follow up appointments.   Follow-Up Appointments: Follow-up Information    Swinteck, Aaron Edelman, MD. Schedule an appointment as soon as  possible for a visit in 2 week(s).   Specialty:  Orthopedic Surgery Why:  For wound re-check Contact information: [address] [place] [address] [phone]        Dickie La, MD Follow up on 09/09/2017.   Specialties:  Family Medicine, Sports Medicine Why:  your appointment is at 9;10 am. Please make sure you arrive early Contact information: [address] [place] [address] [phone]           Marjie Skiff, MD 08/20/2017, 2:05 PM PGY-3, [place]

## 2017-08-20 NOTE — Progress Notes (Signed)
Occupational Therapy Treatment Patient Details Name: [name] MRN: [id number] DOB: 1920-09-20 Today's Date: 08/20/2017    History of present illness Pt is a 83 year old man admitted for elective R THA (direct anterior approach). PMH includes L posterior THA, HTN, DM, afib which converted, gout.   OT comments  Pt progressing towards acute OT goals. Focus of session was sit<>stand transfer from recliner and improving toleration of standing position towards goal of increased independence with LB ADLs and functional transfers. D/c plan remains appropriate.    Follow Up Recommendations  SNF;Supervision/Assistance - 24 hour    Equipment Recommendations  Other (comment) (defer to next venue)    Recommendations for Other Services      Precautions / Restrictions Precautions Precautions: Fall Restrictions Weight Bearing Restrictions: Yes RLE Weight Bearing: Weight bearing as tolerated       Mobility Bed Mobility               General bed mobility comments: up in chair  Transfers Overall transfer level: Needs assistance Equipment used: Rolling walker (2 wheeled) Transfers: Sit to/from Stand Sit to Stand: +2 physical assistance;Mod assist         General transfer comment: cues to scoot forward further prior to standing. Assist to power up and maintain balance. From recliner.    Balance Overall balance assessment: Needs assistance Sitting-balance support: No upper extremity supported;Feet supported Sitting balance-Leahy Scale: Fair Sitting balance - Comments: Cues to lean forward for balance   Standing balance support: Bilateral upper extremity supported Standing balance-Leahy Scale: Poor Standing balance comment: Reliant on BUE support and assist for standing                           ADL either performed or assessed with clinical judgement   ADL Overall ADL's : Needs assistance/impaired                                        General ADL Comments: Pt sitting up in recliner upon OT arrival. Practiced sit<>stand transfer towards goal of LB ADLs. Stood about 2 minutes. Fatigued.      Vision       Perception     Praxis      Cognition Arousal/Alertness: Awake/alert Behavior During Therapy: WFL for tasks assessed/performed Overall Cognitive Status: Within Functional Limits for tasks assessed                                 General Comments: [facility]        Exercises     Shoulder Instructions       General Comments      Pertinent Vitals/ Pain       Pain Assessment: Faces Faces Pain Scale: Hurts little more Pain Location: R hip Pain Descriptors / Indicators: Sore;Discomfort Pain Intervention(s): Limited activity within patient's tolerance;Monitored during session;Repositioned  Home Living                                          Prior Functioning/Environment              Frequency  Min 2X/week        Progress Toward Goals  OT Goals(current goals  can now be found in the care plan section)  Progress towards OT goals: Progressing toward goals  Acute Rehab OT Goals Patient Stated Goal: to go to rehab and then decide if he needs ALF OT Goal Formulation: With patient Time For Goal Achievement: 09/01/17 Potential to Achieve Goals: Good ADL Goals Pt Will Perform Grooming: with min assist;standing Pt Will Perform Lower Body Bathing: with min assist;sit to/from stand;with adaptive equipment Pt Will Perform Lower Body Dressing: with min assist;with adaptive equipment;sit to/from stand Pt Will Transfer to Toilet: with min assist;ambulating;bedside commode Pt Will Perform Toileting - Clothing Manipulation and hygiene: with min assist;sit to/from stand Additional ADL Goal #1: Pt will perform bed mobility with min assist.  Plan Discharge plan remains appropriate    Co-evaluation                 AM-PAC PT "6 Clicks" Daily Activity     Outcome  Measure   Help from another person eating meals?: None Help from another person taking care of personal grooming?: A Little Help from another person toileting, which includes using toliet, bedpan, or urinal?: A Lot Help from another person bathing (including washing, rinsing, drying)?: A Lot Help from another person to put on and taking off regular upper body clothing?: A Little Help from another person to put on and taking off regular lower body clothing?: Total 6 Click Score: 15    End of Session Equipment Utilized During Treatment: Rolling walker  OT Visit Diagnosis: Unsteadiness on feet (R26.81);Other abnormalities of gait and mobility (R26.89);Muscle weakness (generalized) (M62.81);Pain Pain - Right/Left: Right Pain - part of body: Hip   Activity Tolerance Patient limited by fatigue   Patient Left in chair;with call bell/phone within reach   Nurse Communication          Time: 0569-7948 OT Time Calculation (min): 22 min  Charges: OT General Charges $OT Visit: 1 Visit OT Treatments $Self Care/Home Management : 8-22 mins     Hortencia Pilar 08/20/2017, 1:01 PM

## 2017-08-20 NOTE — Progress Notes (Signed)
   Subjective:  Patient reports pain as mild to moderate.  Denies N/V/CP/SOB.  Objective:   VITALS:   Vitals:   08/19/17 0546 08/19/17 1327 08/19/17 2102 08/20/17 0514  BP: 139/61 (!) 118/42 (!) 123/49 (!) 123/50  Pulse: 70 70 72 70  Resp: 18 18 19 18   Temp: 97.6 F (36.4 C) (!) 97.5 F (36.4 C) 98.2 F (36.8 C) 98.2 F (36.8 C)  TempSrc: Oral Oral Oral   SpO2: 98% 99% 100% 99%  Weight:      Height:        NAD ABD soft Sensation intact distally Intact pulses distally Dorsiflexion/Plantar flexion intact Incision: dressing C/D/I Compartment soft Thigh mildly swollen, no hematoma - unchanged   Lab Results  Component Value Date   WBC 7.7 08/20/2017   HGB 8.5 (L) 08/20/2017   HCT 24.2 (L) 08/20/2017   MCV 88.0 08/20/2017   PLT 129 (L) 08/20/2017   BMET    Component Value Date/Time   NA 131 (L) 08/18/2017 0344   NA 131 (L) 11/19/2016 0000   K 4.0 08/18/2017 0344   CL 101 08/18/2017 0344   CO2 23 08/18/2017 0344   GLUCOSE 175 (H) 08/18/2017 0344   BUN 23 (H) 08/18/2017 0344   BUN 23 11/19/2016 0000   CREATININE 1.30 (H) 08/18/2017 0344   CREATININE 1.16 (H) 01/28/2017 1111   CALCIUM 7.6 (L) 08/18/2017 0344   GFRNONAA 45 (L) 08/18/2017 0344   GFRAA 52 (L) 08/18/2017 0344     Assessment/Plan: 3 Days Post-Op   Principal Problem:   Osteoarthritis of right hip Active Problems:   Elective surgery   WBAT with walker DVT ppx: xarelto, SCDs, TEDs ABLA: hgb stable, received transfuse 2 units PRBCs with appropriate response PT/OT Dispo: ok for d/c   Ziyad Dyar, Horald Pollen 08/20/2017, 1:39 PM   Rod Can, MD Cell [phone]

## 2017-08-21 NOTE — Telephone Encounter (Signed)
LVM on home and mobile to call office back to inform them of below. Please give them the below information if they call back. Katharina Caper, April D, [place]

## 2017-08-21 NOTE — Telephone Encounter (Signed)
Pts daughter informed.   BUT received message from Pharmacy after phone call.   Rx med is not available.  Can it be changed to Anusol-HC 2.5 % Rectal Cream?  Please call and give ok to change. Clinton Sawyer, Salome Spotted, [place]

## 2017-08-24 ENCOUNTER — Telehealth: Payer: Self-pay

## 2017-08-24 NOTE — Telephone Encounter (Signed)
Shelly from [employer] called back. Insurance will only cover the hydrocortisone 2.5 cream. Will not cover the promoxine 1%. Spoke to Dr. Nori Riis. Medication approved. Ottis Stain, CMA

## 2017-08-24 NOTE — Telephone Encounter (Signed)
Spoke to [employer]. They requested hydrocortisone 2.5% promoxine 1% rectal cream. This is being filled. Ottis Stain, CMA

## 2017-08-24 NOTE — Telephone Encounter (Signed)
Dear [name] Team Yes but you will have to call it in as itis not available as a choice in [place]! [name]

## 2017-08-24 NOTE — Telephone Encounter (Signed)
error 

## 2017-08-24 NOTE — Clinical Social Work Placement (Signed)
   CLINICAL SOCIAL WORK PLACEMENT  NOTE  Date:  08/24/2017  Patient Details  Name: SILVESTER REIERSON MRN: [id number] Date of Birth: [birth year]  Clinical Social Work is seeking post-discharge placement for this patient at the [place] level of care (*CSW will initial, date and re-position this form in  chart as items are completed):  Yes   Patient/family provided with [facility] Work Department's list of facilities offering this level of care within the geographic area requested by the patient (or if unable, by the patient's family).  Yes   Patient/family informed of their freedom to choose among providers that offer the needed level of care, that participate in Medicare, Medicaid or managed care program needed by the patient, have an available bed and are willing to accept the patient.  Yes   Patient/family informed of [facility]'s ownership interest in [facility] and [facility], as well as of the fact that they are under no obligation to receive care at these facilities.  PASRR submitted to EDS on 08/20/17     PASRR number received on 08/20/17     Existing PASRR number confirmed on       FL2 transmitted to all facilities in geographic area requested by pt/family on 08/20/17     FL2 transmitted to all facilities within larger geographic area on       Patient informed that his/her managed care company has contracts with or will negotiate with certain facilities, including the following:        Yes   Patient/family informed of bed offers received.  Patient chooses bed at [place] [place]     Physician recommends and patient chooses bed at      Patient to be transferred to [place] [place] on 08/20/17.  Patient to be transferred to facility by ptar     Patient family notified on 08/20/17 of transfer.  Name of family member notified:  bunny     PHYSICIAN Please sign FL2     Additional Comment:     _______________________________________________ Jorge Ny, LCSW 08/24/2017, 1:30 PM

## 2017-09-01 ENCOUNTER — Other Ambulatory Visit: Payer: Self-pay | Admitting: *Deleted

## 2017-09-01 NOTE — Patient Outreach (Signed)
[facility]) Care Management  09/01/2017  JASMOND RIVER [birth year] [id number]   Met with Nira Conn, SW at facility, She thinks patient will discharge soon. He will go home, he wants to go to ALF but there are no private rooms at this time.   Met with patient and step son in the room.  Patient states he was pretty independent until this surgery, he hopes to go home soon with support from his step son and step-son's wife. Patient had a lot of pain in hip and needed the surgery, he states the MD wanted him to go home and not go to SNF but family wanted patient to have inpatient rehab before going home.  Patient reports he lives alone and had been driving before the surgery.  Step-son states that they put patient on wait list for private room at [facility] . He is unsure of how long it would be, they have not considered any other facility.  Patient reports he has a sore spot on his bottom, he developed while in the hospital but has gotten worse since being at facility.   RNCM reviewed [facility] program services.  RNCM left packet with patient for his daughter-in-law to review RNCM discussed Private pay assistance until patient can get into ALF.  RNCM spoke with Claiborne Billings, RN re: Patient bedsore, she states that they are treating wound and have the wound care NP rounding today to work up a new treatment plan.  Plan to follow up as needed with daughter in law re: [facility] and answer any other questions.  Royetta Crochet. Laymond Purser, RN, BSN, [place] [phone]) Business Cell  [phone]) Toll Free Office

## 2017-09-08 ENCOUNTER — Other Ambulatory Visit: Payer: Self-pay | Admitting: *Deleted

## 2017-09-08 NOTE — Patient Outreach (Signed)
[facility]) Care Management  09/08/2017  EBRIMA RANTA [birth year] [id number]   Spoke with Derenda Mis, SW at facility, Patient will discharge next week to [facility]. He has accepted a semi-private room.  No [facility] care management needs at this time. Plan to sign off. Royetta Crochet. Laymond Purser, RN, BSN, [place] [phone]) Business Cell  [phone]) Toll Free Office

## 2017-09-09 ENCOUNTER — Inpatient Hospital Stay: Payer: Medicare Other | Admitting: Family Medicine

## 2017-09-10 ENCOUNTER — Inpatient Hospital Stay: Payer: Medicare Other

## 2017-09-17 ENCOUNTER — Telehealth: Payer: Self-pay | Admitting: *Deleted

## 2017-09-17 NOTE — Telephone Encounter (Signed)
Joy for [facility] will be faxing over paperwork that needs to be signed by Dr. Nori Riis or whomever is covering for her this week. You may contact her at he number above with any questions

## 2017-09-18 ENCOUNTER — Emergency Department (HOSPITAL_COMMUNITY): Payer: Medicare Other

## 2017-09-18 ENCOUNTER — Encounter (HOSPITAL_COMMUNITY): Payer: Self-pay | Admitting: Emergency Medicine

## 2017-09-18 ENCOUNTER — Emergency Department (HOSPITAL_COMMUNITY)
Admission: EM | Admit: 2017-09-18 | Discharge: 2017-09-18 | Disposition: A | Discharge status: Home / Self Care | Payer: Medicare Other | Attending: Emergency Medicine | Admitting: Emergency Medicine

## 2017-09-18 DIAGNOSIS — Y999 Unspecified external cause status: Secondary | ICD-10-CM | POA: Insufficient documentation

## 2017-09-18 DIAGNOSIS — W19XXXA Unspecified fall, initial encounter: Secondary | ICD-10-CM | POA: Diagnosis not present

## 2017-09-18 DIAGNOSIS — S42034A Nondisplaced fracture of lateral end of right clavicle, initial encounter for closed fracture: Secondary | ICD-10-CM | POA: Diagnosis not present

## 2017-09-18 DIAGNOSIS — S0990XA Unspecified injury of head, initial encounter: Secondary | ICD-10-CM | POA: Diagnosis present

## 2017-09-18 DIAGNOSIS — Z7901 Long term (current) use of anticoagulants: Secondary | ICD-10-CM | POA: Diagnosis not present

## 2017-09-18 DIAGNOSIS — I129 Hypertensive chronic kidney disease with stage 1 through stage 4 chronic kidney disease, or unspecified chronic kidney disease: Secondary | ICD-10-CM | POA: Insufficient documentation

## 2017-09-18 DIAGNOSIS — S0101XA Laceration without foreign body of scalp, initial encounter: Secondary | ICD-10-CM | POA: Insufficient documentation

## 2017-09-18 DIAGNOSIS — N183 Chronic kidney disease, stage 3 (moderate): Secondary | ICD-10-CM | POA: Diagnosis not present

## 2017-09-18 DIAGNOSIS — Z950 Presence of cardiac pacemaker: Secondary | ICD-10-CM | POA: Insufficient documentation

## 2017-09-18 DIAGNOSIS — E1122 Type 2 diabetes mellitus with diabetic chronic kidney disease: Secondary | ICD-10-CM | POA: Insufficient documentation

## 2017-09-18 DIAGNOSIS — Y939 Activity, unspecified: Secondary | ICD-10-CM | POA: Diagnosis not present

## 2017-09-18 DIAGNOSIS — Y929 Unspecified place or not applicable: Secondary | ICD-10-CM | POA: Diagnosis not present

## 2017-09-18 DIAGNOSIS — Z79899 Other long term (current) drug therapy: Secondary | ICD-10-CM | POA: Insufficient documentation

## 2017-09-18 DIAGNOSIS — Z87891 Personal history of nicotine dependence: Secondary | ICD-10-CM | POA: Diagnosis not present

## 2017-09-18 NOTE — ED Notes (Signed)
ED Provider at bedside. 

## 2017-09-18 NOTE — Discharge Instructions (Signed)
Sling may be off as needed for dressing or bathing. Suture removal in 7-10 days.

## 2017-09-18 NOTE — ED Provider Notes (Signed)
[facility] EMERGENCY DEPARTMENT Provider Note   CSN: [id number] Arrival date & time: 09/18/17  1103     History   Chief Complaint Chief Complaint  Patient presents with  . Fall    HPI [name] is a 83 y.o. male. Chief complaint is fall, scalp laceration.  HPI: 83 year old male. History of paroxysmal atrial fibrillation. He is on Xarelto. Recent elective right total hip arthroplasty. Remains on Xarelto. Only 10 per day.  He dropped something today. He stood up and was leaning forward to grab when he lost his balance and fell. He struck his left parietal occipital scalp against a stationary piece of furniture and has a laceration. Minimal to no headache. No loss of consciousness. He is not amnestic. Neurological symptoms or complaints now.  Past Medical History:  Diagnosis Date  . Acute gastritis without mention of hemorrhage   . Arthritis   . Atrial fibrillation ([place])   . Cancer ([place])    basal cell skin cancer removed  . Chronic kidney disease, stage III (moderate) (HCC)   . Constipation   . Diverticulosis   . Diverticulosis of colon (without mention of hemorrhage)   . Environmental allergies    HISTORY OF  . Esophageal dysmotility   . Family history of malignant neoplasm of gastrointestinal tract   . GERD (gastroesophageal reflux disease)   . Gout   . Hiatal hernia   . History of skin cancer   . Hypertension   . Hypertrophy of prostate with urinary obstruction and other lower urinary tract symptoms (LUTS)   . Overactive bladder   . Personal history of colonic polyps 10/21/2006   hyperplastic   . Presence of permanent cardiac pacemaker 10/14/05   medtronic Adapta  . Second degree AV block 11/29/2014  . Sinus node dysfunction (HCC)   . Small bowel obstruction ([place])   . Stricture and stenosis of esophagus   . Type II or unspecified type diabetes mellitus without mention of complication, not stated as uncontrolled    pt states that he checks  his blood sugar once weekly, is not on oral meds or insulin  . Unspecified cataract   . Vitamin B12 deficiency     Patient Active Problem List   Diagnosis Date Noted  . Postop check   . Osteoarthritis of right hip 08/17/2017  . Elective surgery   . Normocytic anemia   . Pressure injury of skin 03/15/2017  . SBO (small bowel obstruction) ([place]) 03/14/2017  . Essential hypertension   . Small bowel obstruction ([place])   . Second degree AV block 11/29/2014  . Abnormal esophagram 09/07/2014  . Pacemaker - Medtronic Adapta 2006, gen change 2015 07/23/2013  . Chronic right hip pain 09/22/2012  . Testicular swelling, right 09/22/2012  . Presbycusis 05/14/2012  . Long term current use of anticoagulant 01/30/2012  . Paroxysmal atrial fibrillation ([place]) 01/30/2012  . Hx SBO 01/30/2012  . S/P appendectomy 01/30/2012  . CHRONIC KIDNEY DISEASE STAGE III (MODERATE) 12/03/2010  . ALLERGIC RHINITIS 09/05/2009  . HOARSENESS 05/15/2009  . HIATAL HERNIA 12/14/2008  . Diverticulosis of large intestine 12/14/2008  . COLONIC POLYPS, HYPERPLASTIC, HX OF 12/14/2008  . ESOPHAGEAL STRICTURE 10/04/2008  . Other vitamin B12 deficiency anemia 01/26/2008  . GLAUCOMA 01/14/2007  . CATARACT 01/14/2007  . SICK SINUS SYNDROME 01/14/2007  . GASTROESOPHAGEAL REFLUX, NO ESOPHAGITIS 01/14/2007    Past Surgical History:  Procedure Laterality Date  . APPENDECTOMY    . CARDIOVERSION  10/01/2011   Procedure: CARDIOVERSION;  Surgeon: Sanda Klein;  Location: [facility] OR;  Service: Cardiovascular;  Laterality: N/A;  OP CARDIOVERSION  . CARDIOVERSION  11/02/12   successful  . ESOPHAGOGASTRODUODENOSCOPY (EGD) WITH PROPOFOL N/A 09/22/2014   Procedure: ESOPHAGOGASTRODUODENOSCOPY (EGD) WITH PROPOFOL;  Surgeon: Jerene Bears, MD;  Location: [facility];  Service: Gastroenterology;  Laterality: N/A;  . EYE SURGERY Bilateral years ago   cataract lens replacment  . LAPAROSCOPY     for bowel obstruction  . PACEMAKER INSERTION   replaced 2 months ago, total of 8 years with pacemaker  . PERMANENT PACEMAKER GENERATOR CHANGE N/A 03/14/2014   Procedure: PERMANENT PACEMAKER GENERATOR CHANGE;  Surgeon: Sanda Klein, MD; Medtronic Adapta L model number [place], serial number [id number]   . PROSTATE SURGERY    . SAVORY DILATION N/A 09/22/2014   Procedure: SAVORY DILATION;  Surgeon: Jerene Bears, MD;  Location: [facility];  Service: Gastroenterology;  Laterality: N/A;  . TONSILLECTOMY    . TOTAL HIP ARTHROPLASTY Left   . TOTAL HIP ARTHROPLASTY Right 08/17/2017   Procedure: RIGHT TOTAL HIP ARTHROPLASTY ANTERIOR APPROACH;  Surgeon: Rod Can, MD;  Location: [facility];  Service: Orthopedics;  Laterality: Right;  Needs RNFA       Home Medications    Prior to Admission medications   Medication Sig Start Date End Date Taking? Authorizing Provider  beta carotene w/minerals (OCUVITE) tablet Take 1 tablet by mouth daily.    Yes [provider]  Blood Glucose Monitoring Suppl (ONE TOUCH ULTRA SYSTEM KIT) w/Device KIT 1 kit by Does not apply route once. 12/12/15  Yes Dickie La, MD  dronedarone (MULTAQ) 400 MG tablet Take 1 tablet (400 mg total) by mouth 2 (two) times daily with a meal. 07/22/17  Yes [name], Mihai, MD  glucose blood test strip Use as instructed 04/15/17  Yes Dickie La, MD  hydrocortisone (ANUSOL-HC) 25 MG suppository Place 25 mg rectally 2 (two) times daily.   Yes [provider]  iron polysaccharides (FERREX 150) 150 MG capsule Take 150 mg by mouth daily.   Yes [provider]  Lancets MISC Use with glucometer as directed 12/12/15  Yes Dickie La, MD  magnesium hydroxide (MILK OF MAGNESIA) 400 MG/5ML suspension Take 15 mLs by mouth daily as needed for mild constipation.   Yes [provider]  Multiple Vitamins-Minerals (MULTIVITAMIN WITH MINERALS) tablet Take 1 tablet by mouth daily.   Yes [provider]  pantoprazole (PROTONIX) 40 MG tablet Take 1 tablet (40 mg  total) by mouth daily before breakfast. 11/19/16  Yes Dickie La, MD  Polyethyl Glycol-Propyl Glycol (SYSTANE FREE OP) Place 1 drop into both eyes 2 (two) times daily as needed (dry eyes). For dry eyes    Yes [provider]  polyethylene glycol (MIRALAX / GLYCOLAX) packet Take 17 g by mouth daily. Patient taking differently: Take 17 g by mouth 2 (two) times daily. Mix with 8 oz juice and drink 08/30/15  Yes Haney, Alyssa A, MD  Probiotic Product (PRO-BIOTIC BLEND PO) Take 1 capsule by mouth daily.   Yes [provider]  Probiotic Product (PROBIOTIC PO) Take 1 tablet by mouth daily.   Yes [provider]  psyllium (HYDROCIL/METAMUCIL) 95 % PACK Take 1 packet by mouth 2 (two) times daily. 03/16/17  Yes Carlyle Dolly, MD  ramipril (ALTACE) 2.5 MG capsule Take 1 capsule (2.5 mg total) by mouth daily. 04/15/17  Yes Dickie La, MD  rivaroxaban (XARELTO) 10 MG TABS tablet Take 1 tablet (  10 mg total) by mouth daily. Patient taking differently: Take 10 mg by mouth See admin instructions.  08/21/17  Yes Diallo, Abdoulaye, MD  sennosides-docusate sodium (SENOKOT-S) 8.6-50 MG tablet Take 1 tablet by mouth daily.   Yes [provider]  silodosin (RAPAFLO) 4 MG CAPS capsule Take 2 capsules (8 mg total) by mouth daily with breakfast. Patient taking differently: Take 8 mg by mouth daily with supper.  02/06/17  Yes Luiz Blare Y, DO  spironolactone (ALDACTONE) 25 MG tablet Take 12.5 mg by mouth daily.   Yes [provider]  torsemide (DEMADEX) 10 MG tablet Take 1 tablet (10 mg total) by mouth 2 (two) times a week. Mondays and Fridays. 04/16/17  Yes Dickie La, MD  vitamin B-12 (CYANOCOBALAMIN) 500 MCG tablet Take 500 mcg by mouth daily.   Yes [provider]  hydrocortisone-pramoxine (PROCTOFOAM-HC) rectal foam Place 1 applicator rectally 2 (two) times daily. Patient not taking: Reported on 09/18/2017 08/20/17   Dickie La, MD  nebivolol (BYSTOLIC) 10  MG tablet Take 1 tablet (10 mg total) by mouth daily. Patient not taking: Reported on 09/18/2017 04/15/17   Dickie La, MD  traMADol (ULTRAM) 50 MG tablet Take 1-2 tablets (50-100 mg total) by mouth every 6 (six) hours as needed. Patient taking differently: Take 100 mg by mouth every 6 (six) hours as needed for severe pain.  08/20/17   Marjie Skiff, MD    Family History Family History  Problem Relation Age of Onset  . Parkinsonism Mother   . Hypertension Father   . Colon cancer Brother 36  . Hypertension Brother   . Hodgkin's lymphoma Grandchild     Social History Social History  Substance Use Topics  . Smoking status: Former Smoker    Packs/day: 1.00    Years: 20.00    Types: Cigarettes    Quit date: 11/17/1969  . Smokeless tobacco: Never Used  . Alcohol use No     Comment: none since 1990     Allergies   Codeine   Review of Systems Review of Systems  Constitutional: Negative for appetite change, chills, diaphoresis, fatigue and fever.  HENT: Negative for mouth sores, sore throat and trouble swallowing.   Eyes: Negative for visual disturbance.  Respiratory: Negative for cough, chest tightness, shortness of breath and wheezing.   Cardiovascular: Negative for chest pain.  Gastrointestinal: Negative for abdominal distention, abdominal pain, diarrhea, nausea and vomiting.  Endocrine: Negative for polydipsia, polyphagia and polyuria.  Genitourinary: Negative for dysuria, frequency and hematuria.  Musculoskeletal: Negative for gait problem.  Skin: Positive for wound. Negative for color change, pallor and rash.  Neurological: Negative for dizziness, syncope, light-headedness and headaches.  Hematological: Does not bruise/bleed easily.  Psychiatric/Behavioral: Negative for behavioral problems and confusion.     Physical Exam Updated Vital Signs BP (!) 147/76 (BP Location: Right Arm)   Pulse 70   Temp (!) 97.4 F (36.3 C) (Oral)   Resp 14   SpO2 100%   Physical  Exam  Constitutional: He is oriented to person, place, and time. He appears well-developed and well-nourished. No distress.  HENT:  Head: Normocephalic.  3 cm linear laceration left parietal occipital scalp. Skull not visible, or palpable within the wound. No blood over TMs, mastoids, or from ears nose or mouth. No midline neck tenderness. Otherwise normal HEENT exam.  Eyes: Pupils are equal, round, and reactive to light. Conjunctivae are normal. No scleral icterus.  Neck: Normal range of motion. Neck supple. No  thyromegaly present.  Cardiovascular: Normal rate and regular rhythm.  Exam reveals no gallop and no friction rub.   No murmur heard. Pulmonary/Chest: Effort normal and breath sounds normal. No respiratory distress. He has no wheezes. He has no rales.  Abdominal: Soft. Bowel sounds are normal. He exhibits no distension. There is no tenderness. There is no rebound.  Musculoskeletal: Normal range of motion.  No deformity or ecchymosis of the right shoulder. He complains of pain with range of motion relates this to a fall a few months ago where he did not injure himself with the fall, but hurt his shoulder trying to get up. His cause pain with elevation since that time.  Neurological: He is alert and oriented to person, place, and time.  Skin: Skin is warm and dry. No rash noted.  Psychiatric: He has a normal mood and affect. His behavior is normal.     ED Treatments / Results  Labs (all labs ordered are listed, but only abnormal results are displayed) Labs Reviewed - No data to display  EKG  EKG Interpretation None       Radiology Dg Shoulder Right  Result Date: 09/18/2017 CLINICAL DATA:  Fall today with right shoulder pain. Initial encounter. EXAM: RIGHT SHOULDER - 2+ VIEW COMPARISON:  None. FINDINGS: Lateral third clavicle fracture that appears extra-articular. There is mild superior impaction of the fracture. A lower fracture component appears downward displaced, possibly  due to coracoclavicular ligament attachment. High humeral head is located. Osteopenic appearance. Calcified granuloma in the right upper lobe. IMPRESSION: Lateral third clavicle fracture with mild fragment displacement. Electronically Signed   By: Monte Fantasia M.D.   On: 09/18/2017 12:05   Ct Head Wo Contrast  Result Date: 09/18/2017 CLINICAL DATA:  Fall. EXAM: CT HEAD WITHOUT CONTRAST TECHNIQUE: Contiguous axial images were obtained from the base of the skull through the vertex without intravenous contrast. COMPARISON:  CT head dated October 01, 2005. FINDINGS: Brain: No evidence of acute infarction, hemorrhage, hydrocephalus, extra-axial collection or mass lesion/mass effect. Stable cerebral atrophy. Vascular: Atherosclerotic vascular calcification of the carotid siphons. No hyperdense vessel. Skull: Normal. Negative for fracture or focal lesion. Sinuses/Orbits: No acute finding. Other: Small left lateral scalp hematoma. IMPRESSION: 1. No acute intracranial abnormality. Small left lateral scalp hematoma. Electronically Signed   By: Titus Dubin M.D.   On: 09/18/2017 12:36    Procedures Procedures (including critical care time)  Medications Ordered in ED Medications - No data to display   Initial Impression / Assessment and Plan / ED Course  I have reviewed the triage vital signs and the nursing notes.  Pertinent labs & imaging results that were available during my care of the patient were reviewed by me and considered in my medical decision making (see chart for details).     Plan CT of head, right shoulder x-ray. Laceration repair.  LACERATION REPAIR Performed by: Lolita Patella Authorized by: Lolita Patella Consent: Verbal consent obtained. Risks and benefits: risks, benefits and alternatives were discussed Consent given by: patient Patient identity confirmed: provided demographic data Prepped and Draped in normal sterile fashion Wound explored  Laceration  Location: Lt parietal scalp  Laceration Length: 3cm  No Foreign Bodies seen or palpated  Anesthesia: local infiltration  Local anesthetic: lidocaine 2% c epinephrine  Anesthetic total: 3 ml  Irrigation method: syringe Amount of cleaning: standard  Skin closure: 4-0  Number of sutures: 3  Technique: simple  Patient tolerance: Patient tolerated the procedure well with no immediate  complications.   Final Clinical Impressions(s) / ED Diagnoses   Final diagnoses:  Laceration of scalp, initial encounter   X rays show a distal clavicle fracture, perhaps re-injury of previous fracture. Pt not swollen and no Eccymosis, even with Xarelto. Plan sling PRN. SR 7-10 days.   New Prescriptions New Prescriptions   No medications on file     Tanna Furry, MD 09/18/17 [phone]

## 2017-09-18 NOTE — ED Triage Notes (Addendum)
Patient brought in by [facility] from [facility] after a fall. Patient states he was getting up from a chair when he lost his balance and fell, striking his head against the corner of a dresser. Approximately 3cm laceration to left side of head, bleeding controlled by EMS. Takes xarelto.  Patient denies dizziness and loss of consciousness during event.  Patient complaining of 7/10 right shoulder pain that started three months ago with a previous fall but increased today. Patient alert and oriented and in no apparent distress at this time.

## 2017-09-18 NOTE — ED Notes (Signed)
Patient transported to CT 

## 2017-09-18 NOTE — Progress Notes (Signed)
Orthopedic Tech Progress Note Patient Details:  [name] 08-15-1920 [id number]  Ortho Devices Type of Ortho Device: Arm sling Ortho Device/Splint Location: applied arm sling to pt right arm for support of injury.  Right ARm.  pt tolerated application very well.  Right ARm.  Ortho Device/Splint Interventions: Application, Adjustment   Kristopher Oppenheim 09/18/2017, 2:27 PM

## 2017-09-18 NOTE — ED Notes (Signed)
Report called to [facility], pt being transported back by Daughter in [employer].

## 2017-09-21 NOTE — Telephone Encounter (Signed)
Dawayne Cirri left message on nurse line requesting form from Donnamarie Poag, RN at [facility] be signed and faxed to [facility]. Left message for [name] that PCP will return tomorrow. Hubbard Hartshorn, RN, BSN

## 2017-09-22 ENCOUNTER — Telehealth: Payer: Self-pay | Admitting: *Deleted

## 2017-09-22 NOTE — Telephone Encounter (Signed)
Tiffany with [facility] calling for verbal nursing orders for 3x/week dressing changes to right inner gluteal area. Patient also has a small areas forming on left inner gluteal that they are watching and applying barrier cream 3x/week.

## 2017-09-23 ENCOUNTER — Other Ambulatory Visit: Payer: Self-pay

## 2017-09-23 ENCOUNTER — Encounter: Payer: Self-pay | Admitting: Family Medicine

## 2017-09-23 ENCOUNTER — Ambulatory Visit (INDEPENDENT_AMBULATORY_CARE_PROVIDER_SITE_OTHER): Payer: Medicare Other | Admitting: Family Medicine

## 2017-09-23 VITALS — BP 116/50 | HR 71 | Temp 97.5°F | Ht 66.0 in

## 2017-09-23 DIAGNOSIS — Z23 Encounter for immunization: Secondary | ICD-10-CM

## 2017-09-23 DIAGNOSIS — I480 Paroxysmal atrial fibrillation: Secondary | ICD-10-CM | POA: Diagnosis not present

## 2017-09-23 DIAGNOSIS — S42031A Displaced fracture of lateral end of right clavicle, initial encounter for closed fracture: Secondary | ICD-10-CM

## 2017-09-23 DIAGNOSIS — S0101XA Laceration without foreign body of scalp, initial encounter: Secondary | ICD-10-CM | POA: Diagnosis not present

## 2017-09-25 NOTE — Assessment & Plan Note (Signed)
Discontinue chronic anticoagulation as risk-benefit ratio now lives very far on the risks side. When he was in the hospital last, discussed this with his cardiologist he is in agreement as well.

## 2017-09-25 NOTE — Progress Notes (Signed)
    CHIEF COMPLAINT / HPI:  Follow-up recent fall. He was seen at the emergency department. He had laceration on the left side of his scalp and sutures were placed 5 days ago. It has not been problematic since then. He fractured his right collarbone he is in a sling. There is still giving him some pain but is well-controlled with his  Tramadol. Has questions about his Xarelto    REVIEW OF SYSTEMS:  No unusual weight change. He feels generally fairly baseline. His mood is good according to his daughter-in-law who is with him today.  PERTINENT  PMH / PSH: I have reviewed the patient's medications, allergies, past medical and surgical history, smoking status and updated in the EMR as appropriate.   OBJECTIVE: GEN.: Well-developed elderly male no acute distress sitting in wheelchair SCALP: 3 cm laceration with good healing noted on the left parietal area. NECK: Full range of motion, nontender PSYCH: Alert and oriented 4. Asks and answers questions appropriately. Judgment is normal. CLAVICLE: Right. Tender to palpation at the acromioclavicular joint. There is mild soft tissue swelling over this area. There is no skin break. No erythema.  IMAGING: He has a distal fracture of the right clavicle with some displacement fragment. Report for the CT scan of the head negative for acute intracranial pathology.  ASSESSMENT / PLAN: #1. Scalp laceration. It has healed well. Is on the fifth day so we'll go ahead and remove his sutures #2. Clavicle fracture distal third: He is using a sling. We discussed prognosis which is excellent. He will use sling when necessary. Expect him to have some significant improvement pain in the next 36 weeks. Continues tramadol when necessary #3. Anticoagulation: We had decided when he was in the hospital last time to discontinue his Xarelto tomorrow and I discussed that with him again. His risk-benefit ratio lies far on the side of risk now for his atrial fibrillation and  risk of stroke. I told him to stop the Xarelto and I have written that order for him to take back to his assisted living facility. Follow-up when necessary.

## 2017-10-02 ENCOUNTER — Telehealth: Payer: Self-pay | Admitting: *Deleted

## 2017-10-02 NOTE — Telephone Encounter (Signed)
Received message on nurse line from [place] with [facility]. OT eval has been completed. Requesting VO for HH OT 1 X per week for 1 week and 2 X per week for 6 weeks. Please contact Sonia Baller at [phone]. Hubbard Hartshorn, RN, BSN

## 2017-10-06 NOTE — Telephone Encounter (Signed)
LVM for OT per message below. Ottis Stain, CMA

## 2017-10-06 NOTE — Telephone Encounter (Signed)
Dear [name] Team Please give verbal order for OT as per note THANKS! [name]

## 2017-10-21 ENCOUNTER — Ambulatory Visit (INDEPENDENT_AMBULATORY_CARE_PROVIDER_SITE_OTHER): Payer: Medicare Other | Admitting: *Deleted

## 2017-10-21 DIAGNOSIS — I495 Sick sinus syndrome: Secondary | ICD-10-CM

## 2017-10-21 LAB — CUP PACEART REMOTE DEVICE CHECK
Battery Impedance: 305 Ohm
Brady Statistic AP VP Percent: 92 %
Brady Statistic AP VS Percent: 0 %
Brady Statistic AS VS Percent: 0 %
Implantable Lead Implant Date: 20061128
Implantable Lead Location: 753859
Implantable Lead Model: 4592
Lead Channel Impedance Value: 522 Ohm
Lead Channel Impedance Value: 632 Ohm
Lead Channel Pacing Threshold Amplitude: 0.75 V
Lead Channel Pacing Threshold Pulse Width: 0.4 ms
Lead Channel Setting Pacing Amplitude: 2.5 V
Lead Channel Setting Sensing Sensitivity: 4 mV
MDC IDC LEAD IMPLANT DT: 20061128
MDC IDC LEAD LOCATION: 753860
MDC IDC MSMT BATTERY REMAINING LONGEVITY: 92 mo
MDC IDC MSMT BATTERY VOLTAGE: 2.79 V
MDC IDC MSMT LEADCHNL RA PACING THRESHOLD AMPLITUDE: 0.5 V
MDC IDC MSMT LEADCHNL RV PACING THRESHOLD PULSEWIDTH: 0.4 ms
MDC IDC PG IMPLANT DT: 20150428
MDC IDC SESS DTM: 20181205153743
MDC IDC SET LEADCHNL RA PACING AMPLITUDE: 2 V
MDC IDC SET LEADCHNL RV PACING PULSEWIDTH: 0.4 ms
MDC IDC STAT BRADY AS VP PERCENT: 8 %

## 2017-10-21 NOTE — Progress Notes (Signed)
Remote pacemaker transmission.   

## 2017-10-22 ENCOUNTER — Encounter: Payer: Self-pay | Admitting: Cardiology

## 2017-10-22 ENCOUNTER — Other Ambulatory Visit: Payer: Self-pay | Admitting: *Deleted

## 2017-10-22 NOTE — Telephone Encounter (Signed)
Patient' daughter-in-law left message on nurse line requesting refill on multaq at mail order pharm and 5 day supply of bystolic sent to [facility]. Should receive 3 month supply of bystolic from mail order within next few days. Note on med list for bystolic states this was discontinued by provider. Please advise. Hubbard Hartshorn, RN, BSN

## 2017-10-23 ENCOUNTER — Telehealth: Payer: Self-pay | Admitting: Family Medicine

## 2017-10-23 MED ORDER — NEBIVOLOL HCL 10 MG PO TABS: 10.0000 mg | ORAL_TABLET | Freq: Every day | ORAL | 3 refills | Status: DC

## 2017-10-23 MED ORDER — NEBIVOLOL HCL 10 MG PO TABS
10.0000 mg | ORAL_TABLET | Freq: Every day | ORAL | 0 refills | Status: DC
Start: 2017-10-23 — End: 2017-12-03

## 2017-10-23 MED ORDER — DRONEDARONE HCL 400 MG PO TABS: 400.0000 mg | ORAL_TABLET | Freq: Two times a day (BID) | ORAL | 3 refills | Status: DC

## 2017-10-23 NOTE — Telephone Encounter (Signed)
He should still be on bystolic so I have refilled it and his multaq. What was stopped was the xarelto. I have updated his med list THANKS! [name]

## 2017-10-23 NOTE — Telephone Encounter (Signed)
Dear [name] Team If he feels fine and is not having any symptoms, make no changes.  If he is not feeling fine, there is a need to call about that.  THANKS! [name]

## 2017-10-23 NOTE — Telephone Encounter (Signed)
Verbally called in 7 pills of Bystolic per Dr. Nori Riis to [facility].  [name] informed. Mace Weinberg, Salome Spotted, CMA

## 2017-10-23 NOTE — Telephone Encounter (Signed)
OT with [facility]:  Pt is experiencing low blood pressures such as 80/42. She got a similar reading on Tuesday. She got him to drink water and light exericse and it came up to 100/52.  The nurses at the nursing home state the morning readings are normal.  In the afternoons when she sees him, it is trending downward.  Please advise

## 2017-10-23 NOTE — Telephone Encounter (Signed)
Informed Jeannine of the information below.  She said he seems fine but wanted to let the Dr. Gwyndolyn Kaufman. She confirmed pt appt with Dr. Nori Riis on the 19th of December. Ottis Stain, CMA

## 2017-11-04 ENCOUNTER — Ambulatory Visit: Payer: Medicare Other | Admitting: Family Medicine

## 2017-11-11 ENCOUNTER — Encounter: Payer: Self-pay | Admitting: Family Medicine

## 2017-11-11 ENCOUNTER — Ambulatory Visit (INDEPENDENT_AMBULATORY_CARE_PROVIDER_SITE_OTHER): Payer: Medicare Other | Admitting: Family Medicine

## 2017-11-11 ENCOUNTER — Other Ambulatory Visit: Payer: Self-pay

## 2017-11-11 VITALS — BP 138/70 | HR 80 | Temp 97.8°F | Ht 66.0 in | Wt 175.4 lb

## 2017-11-11 DIAGNOSIS — I480 Paroxysmal atrial fibrillation: Secondary | ICD-10-CM | POA: Diagnosis not present

## 2017-11-11 DIAGNOSIS — G8929 Other chronic pain: Secondary | ICD-10-CM | POA: Diagnosis not present

## 2017-11-11 DIAGNOSIS — M25551 Pain in right hip: Secondary | ICD-10-CM

## 2017-11-11 DIAGNOSIS — K222 Esophageal obstruction: Secondary | ICD-10-CM | POA: Diagnosis not present

## 2017-11-11 DIAGNOSIS — N183 Chronic kidney disease, stage 3 unspecified: Secondary | ICD-10-CM

## 2017-11-12 LAB — BASIC METABOLIC PANEL
BUN/Creatinine Ratio: 24 (ref 10–24)
BUN: 37 mg/dL — AB (ref 10–36)
CALCIUM: 9.2 mg/dL (ref 8.6–10.2)
CHLORIDE: 102 mmol/L (ref 96–106)
CO2: 22 mmol/L (ref 20–29)
Creatinine, Ser: 1.55 mg/dL — ABNORMAL HIGH (ref 0.76–1.27)
GFR, EST AFRICAN AMERICAN: 43 mL/min/{1.73_m2} — AB (ref >59)
GFR, EST NON AFRICAN AMERICAN: 37 mL/min/{1.73_m2} — AB (ref >59)
Glucose: 103 mg/dL — ABNORMAL HIGH (ref 65–99)
POTASSIUM: 4.1 mmol/L (ref 3.5–5.2)
Sodium: 143 mmol/L (ref 134–144)

## 2017-11-12 NOTE — Assessment & Plan Note (Signed)
From a hip standpoint he is doing really well.  He is taking tramadol only as needed and has not used it very much at all.

## 2017-11-12 NOTE — Assessment & Plan Note (Signed)
His fluid status seems to be fine right now.  When I see him back in 2-3 months I will recheck his kidney function.

## 2017-11-12 NOTE — Progress Notes (Signed)
    CHIEF COMPLAINT / HPI: Here with his daughter-in-law for review of his current situation, some questions about medications, some refills.  He is currently living at [facility] facility and seems to be doing well there.  He has a small wound on his right lower leg that has been weeping some clear fluid.  They want me to take a look at that.  They have some questions about some of his medications.  Overall he seems to be doing well.  Denies chest pain.  He was getting dizzy in the afternoon before we changed his spironolactone from a whole tablet to 1/2 tablet.  Is pretty hard to break the tablets in half.  They wonder if he really needs this.  Since we decreased the dose, he has had less dizziness in the afternoon.  His lower extremities have been looking good without a lot of fluid accumulation.  His daughter-in-law notes that sometimes he sits with his legs hanging down this can be problematic.  That is when he really seems to have a lot of swelling.  REVIEW OF SYSTEMS: See HPI  PERTINENT  PMH / PSH: I have reviewed the patient's medications, allergies, past medical and surgical history, smoking status and updated in the EMR as appropriate.   OBJECTIVE: GENERAL: Well-developed elderly male no acute distress. CV: Irregularly irregular.  No murmur LUNGS: Clear to auscultation bilaterally.  Normal respiratory effort. ABDOMEN: Soft, positive bowel sounds EXTREMITY: Is moving extremities x4.  He walks with a bent over posture using a rolling walker with a pretty brisk pace.  He gets out of a chair with minimal assistance from his daughter-in-law and his walker. PSYCHIATRIC: Alert and oriented x4.  Affect is interactive.  He is his usual jolly self. neuro: Decreased hearing Skin: Some chronic venous stasis changes with hyperpigmentation bilateral lower extremity.  He has 1 3 mm area on the right shin that is weeping some serous fluid.  There is no sign of infection or induration around  it.  They had a 4 x 4 gauze over it with plastic tape.  ASSESSMENT / PLAN: #1.  Mild skin ulcer.  Want to put a DuoDERM on that but we did not have any so I used a Tegaderm on top of some AlgiSite gave enough to the daughter-in-law.  Also wrote an order for him to use this rather than paper tape and gauze. 2.  We reconciled his medicines.  We will discontinue spironolactone.  Do not think he really needs to be taking iron at this time either and that contributes to some of his constipation so we will stop that.  He also does not need to be checking his blood sugars.  We discussed that he was fairly reluctant to give up at least a daily blood sugar checked but he does not need it so I discontinued it.  I will refill the items that he needs.  I will see him back as needed. 3.  He is having some sticking sensation again with his foods.  He wants to consider having another esophageal stretching so his daughter-in-law is going make an appointment with the GI doctor for that.  If they need a referral, the let me know.

## 2017-11-12 NOTE — Assessment & Plan Note (Signed)
Some new symptoms.

## 2017-11-12 NOTE — Assessment & Plan Note (Signed)
He continues to be off long-term anticoagulation as we had decided last office visit given his risk benefit ratio.

## 2017-11-13 ENCOUNTER — Encounter: Payer: Self-pay | Admitting: Family Medicine

## 2017-11-13 ENCOUNTER — Other Ambulatory Visit: Payer: Self-pay | Admitting: Family Medicine

## 2017-11-13 DIAGNOSIS — N183 Chronic kidney disease, stage 3 unspecified: Secondary | ICD-10-CM

## 2017-11-26 ENCOUNTER — Telehealth: Payer: Self-pay | Admitting: *Deleted

## 2017-11-26 DIAGNOSIS — I10 Essential (primary) hypertension: Secondary | ICD-10-CM

## 2017-11-26 NOTE — Telephone Encounter (Signed)
Received call back from Dawayne Cirri - patient is taking torsemide daily.  Will need Rx sent to [facility] for short amount and then 7-month supply sent to [facility].  Merrill did not let her know patient was running out of med.    Will route note to Dr. Nori Riis. Burna Forts, BSN, RN-BC

## 2017-11-26 NOTE — Telephone Encounter (Signed)
Message left on clinic nurse voice mail - Dawayne Cirri calling to request refill on [facility].  States he was taking torsemide twice a week, but it was changed to daily.  Called both home and cell number for clarification on what dose patient was taking.  Left message on voice mail to call our office back.    Will route refill request to Dr. Nori Riis.   Burna Forts, BSN, RN-BC

## 2017-11-28 MED ORDER — TORSEMIDE 10 MG PO TABS: 10.0000 mg | ORAL_TABLET | Freq: Every day | ORAL | 3 refills | Status: DC

## 2017-11-28 MED ORDER — TORSEMIDE 10 MG PO TABS: 10.0000 mg | ORAL_TABLET | Freq: Every day | ORAL | 0 refills | Status: DC

## 2017-12-02 ENCOUNTER — Telehealth: Payer: Self-pay | Admitting: *Deleted

## 2017-12-02 NOTE — Telephone Encounter (Signed)
[name] left message on nurse line Stating [facility] is reporting pt's BP averaging 110/58-60. Today was 90/52 and pt is having dizzy spells and weakness. Please fax any med changes to [facility] at [phone]

## 2017-12-03 ENCOUNTER — Other Ambulatory Visit: Payer: Self-pay | Admitting: Family Medicine

## 2017-12-03 ENCOUNTER — Encounter: Payer: Self-pay | Admitting: Family Medicine

## 2017-12-03 MED ORDER — NEBIVOLOL HCL 10 MG PO TABS: ORAL_TABLET | ORAL | 0 refills | Status: DC

## 2017-12-03 NOTE — Telephone Encounter (Signed)
Faxed order to change to 5 mg a day nebivolol

## 2017-12-04 ENCOUNTER — Telehealth: Payer: Self-pay | Admitting: Cardiovascular Disease

## 2017-12-04 ENCOUNTER — Encounter: Payer: Self-pay | Admitting: *Deleted

## 2017-12-04 NOTE — Telephone Encounter (Signed)
Pt have been having spells of weakness,where he felt like he was going to pass out.His blood pressure also have been running low.I made the pt an appointment for Monday with Rhonda Barrett.Please call to evaluate.

## 2017-12-04 NOTE — Telephone Encounter (Signed)
Spoke with pt daughter-n-law, she reports the patient is having what he calls spells where he has weakness, always when sitting. He is in assisted living and his bp will be in the 90's/50's during these spells. The patients PCP cut his bystolic in 1/2 but he did not take it at all yesterday and has had 2 spells today but his bp was fine. She feels the patient will be fine until Monday and if not she will take him to the hospital.

## 2017-12-06 IMAGING — CR DG SHOULDER 2+V*R*
3 series · 3 of 3 positions shown · non-contrast
Comparison: None.

CLINICAL DATA: Fall today with right shoulder pain. Initial
encounter.

EXAM:
RIGHT SHOULDER - 2+ VIEW

[shoulder grashey]
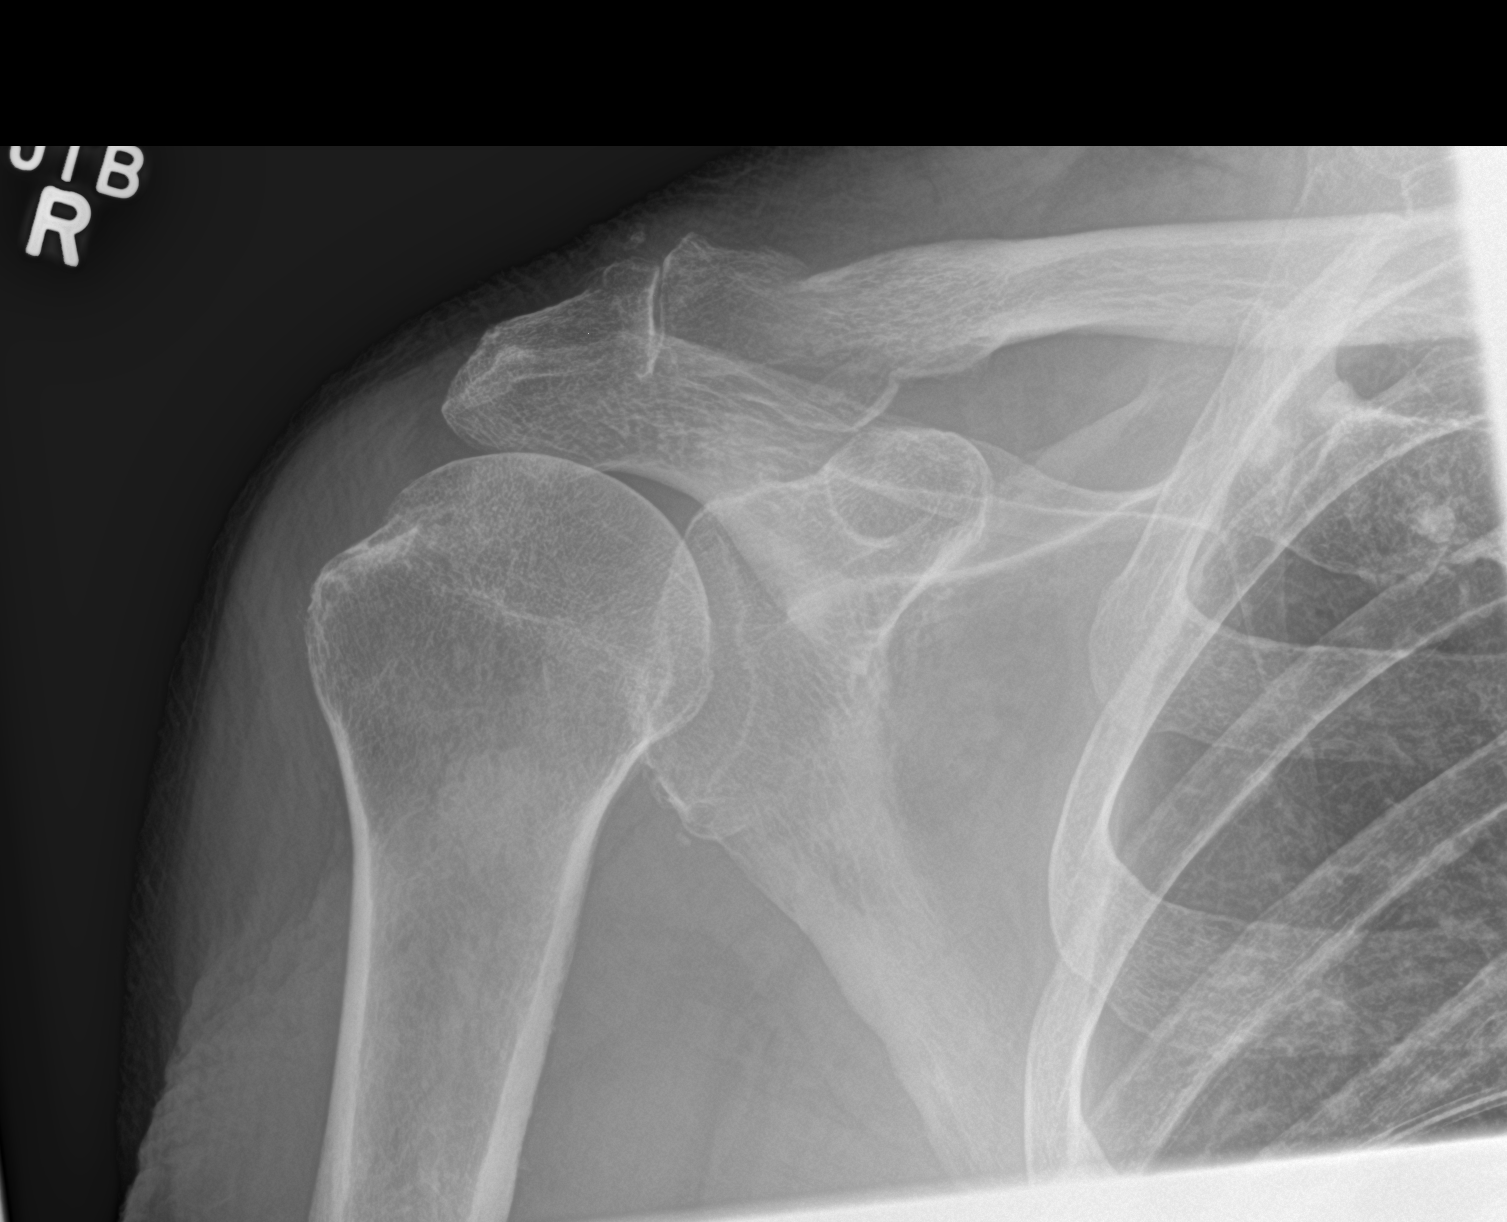

[shoulder y view]
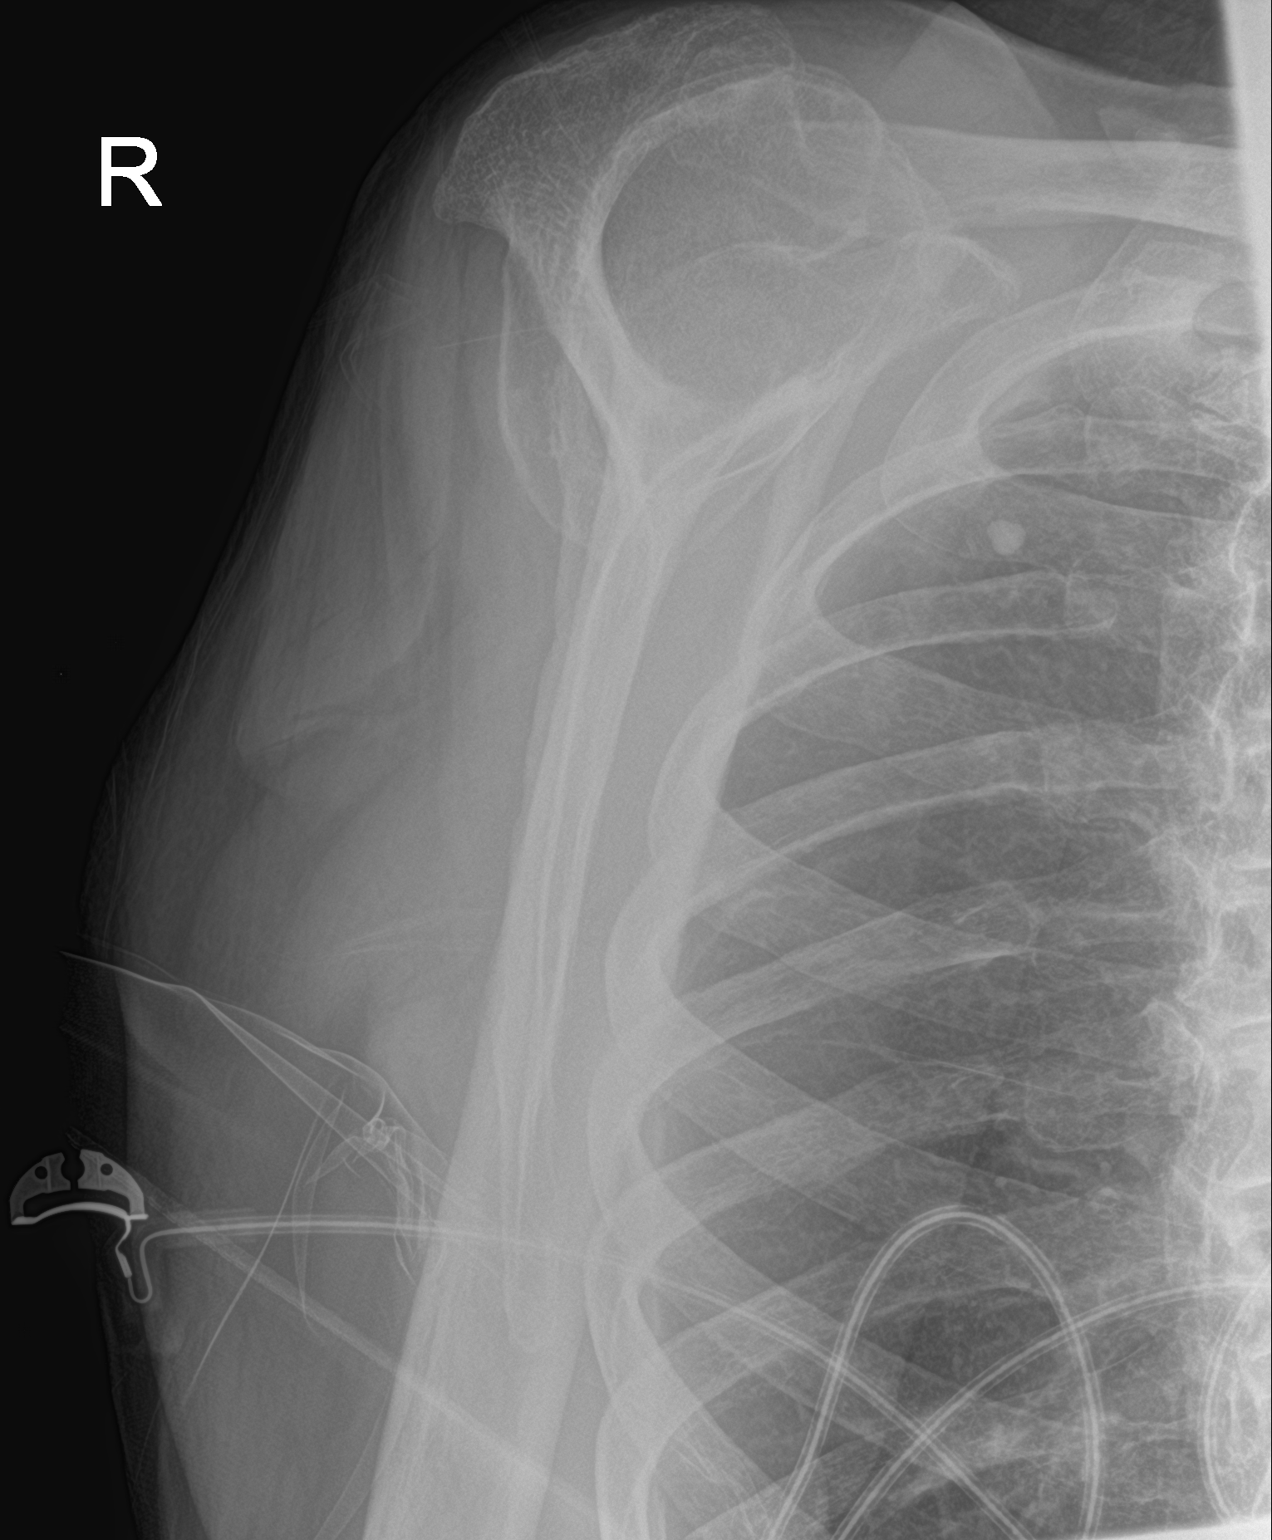

[shoulder ap neutral]
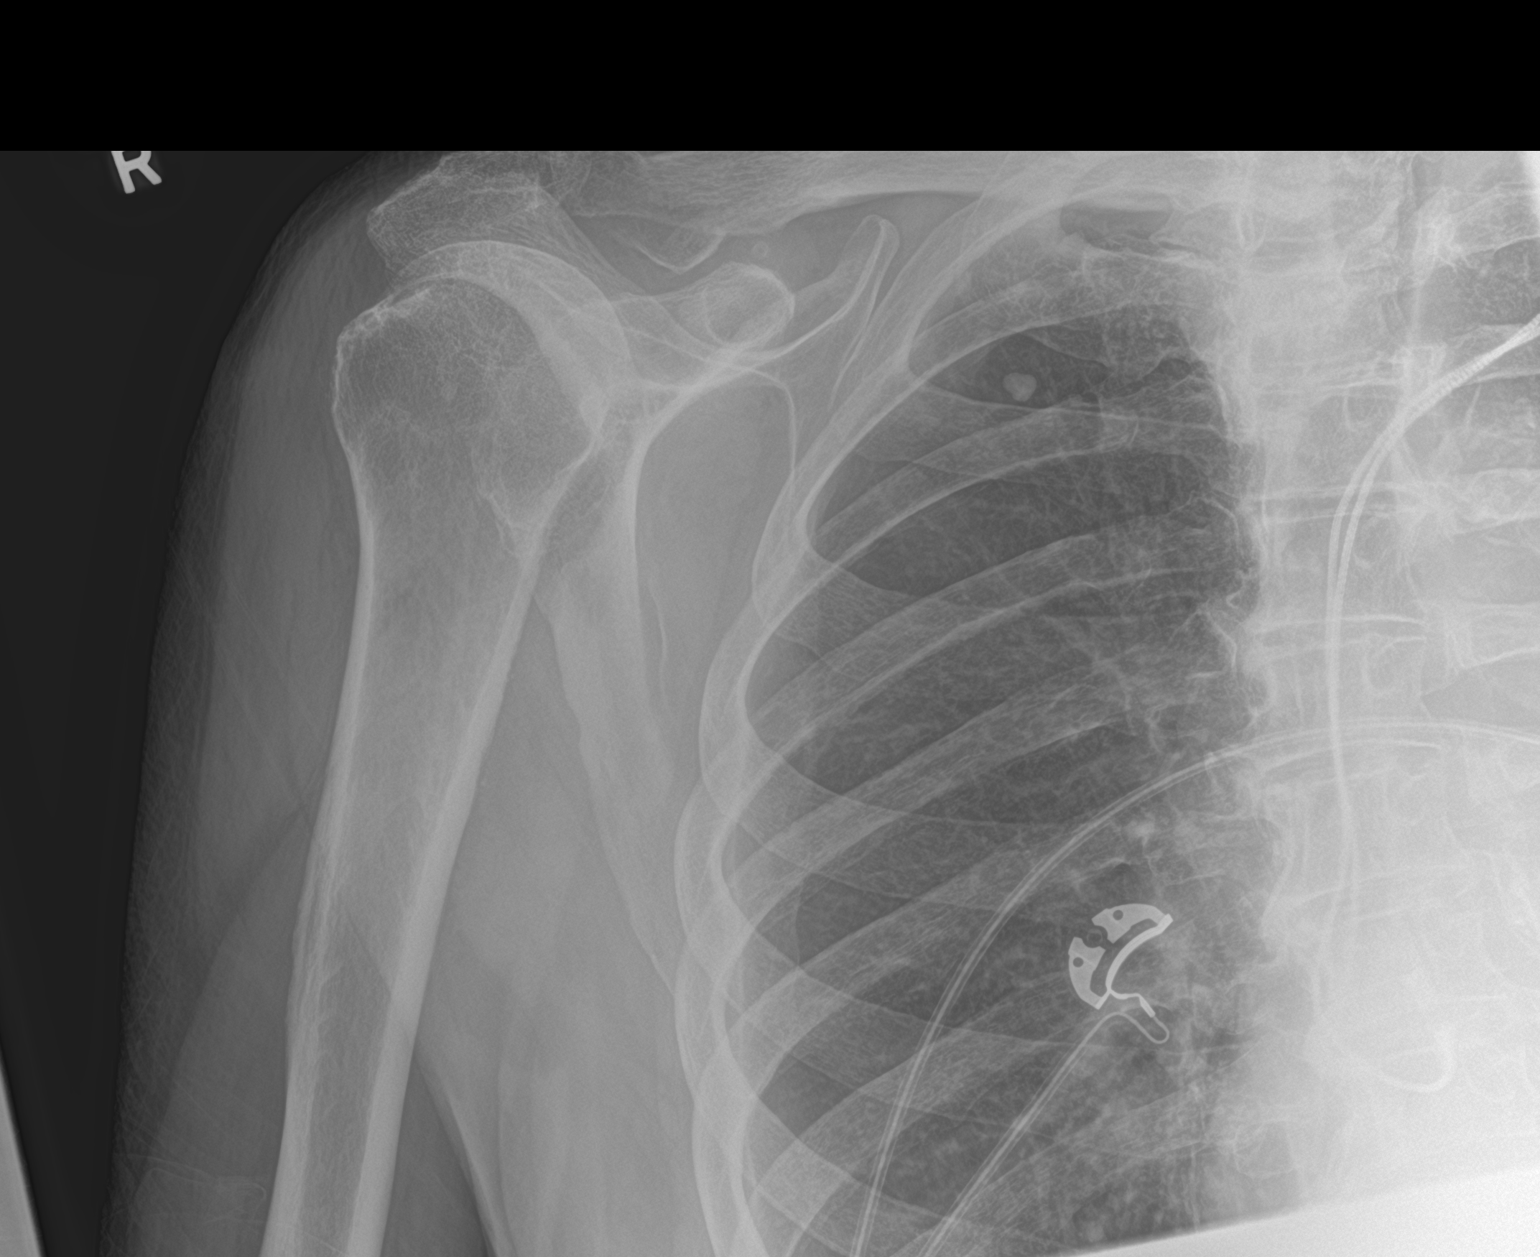

[3 of 3 positions shown; findings below may reference images not displayed]

FINDINGS: Lateral third clavicle fracture that appears extra-articular. There
is mild superior impaction of the fracture. A lower fracture
component appears downward displaced, possibly due to
coracoclavicular ligament attachment. High humeral head is located.
Osteopenic appearance. Calcified granuloma in the right upper lobe.
IMPRESSION: Lateral third clavicle fracture with mild fragment displacement.

## 2017-12-07 ENCOUNTER — Ambulatory Visit (INDEPENDENT_AMBULATORY_CARE_PROVIDER_SITE_OTHER): Payer: Medicare Other | Admitting: Physician Assistant

## 2017-12-07 ENCOUNTER — Encounter: Payer: Self-pay | Admitting: Physician Assistant

## 2017-12-07 VITALS — BP 127/66 | HR 96 | Ht 66.0 in | Wt 180.2 lb

## 2017-12-07 DIAGNOSIS — I952 Hypotension due to drugs: Secondary | ICD-10-CM

## 2017-12-07 DIAGNOSIS — I441 Atrioventricular block, second degree: Secondary | ICD-10-CM

## 2017-12-07 DIAGNOSIS — I480 Paroxysmal atrial fibrillation: Secondary | ICD-10-CM | POA: Diagnosis not present

## 2017-12-07 DIAGNOSIS — R55 Syncope and collapse: Secondary | ICD-10-CM

## 2017-12-07 DIAGNOSIS — I495 Sick sinus syndrome: Secondary | ICD-10-CM | POA: Diagnosis not present

## 2017-12-07 LAB — BASIC METABOLIC PANEL
BUN/Creatinine Ratio: 20 (ref 10–24)
BUN: 33 mg/dL (ref 10–36)
CO2: 28 mmol/L (ref 20–29)
CREATININE: 1.61 mg/dL — AB (ref 0.76–1.27)
Calcium: 9.2 mg/dL (ref 8.6–10.2)
Chloride: 103 mmol/L (ref 96–106)
GFR calc Af Amer: 41 mL/min/{1.73_m2} — ABNORMAL LOW (ref >59)
GFR, EST NON AFRICAN AMERICAN: 36 mL/min/{1.73_m2} — AB (ref >59)
Glucose: 106 mg/dL — ABNORMAL HIGH (ref 65–99)
Potassium: 4.6 mmol/L (ref 3.5–5.2)
SODIUM: 145 mmol/L — AB (ref 134–144)

## 2017-12-07 MED ORDER — NEBIVOLOL HCL 5 MG PO TABS: 5.0000 mg | ORAL_TABLET | Freq: Every day | ORAL | 3 refills | Status: DC

## 2017-12-07 NOTE — Progress Notes (Signed)
Cardiology Office Note   Date:  12/07/2017   ID:  DONI WIDMER, DOB Jul 04, 1920, MRN [id number]  PCP:  Dickie La, MD  Cardiologist: Dr. [name], 07/22/2017 Darreld Mclean, [place]   Chief Complaint  Patient presents with  . Shortness of Breath    pt states some SOB  . Follow-up    states having someweak spells, but states the blood pressure medication has been changed and thinks it could be the cause     History of Present Illness: [name] is a 84 y.o. male with a history of PAF, sinus node dysfunction, high-grade second-degree AV block, s/p MDT Adapta dual lead PPM (2006, generator change 2015). He takes Multaq for AF prevention, is on Xarelto w/ CHA2DS2VASc=4 (age x 2, HTN, DM). He has well controlled HTN and diet controlled type 2 DM.  01/18, phone notes regarding patient having spells with hypotension, Bystolic discontinued and patient continuing to have spells despite normal BP, appointment made  [name] presents for follow up for these spells.   He reports having occasional spells when he is sitting in his wheelchair where he all of a sudden feels very weak and feels like he is going to fall over. He denies any chest pain, shortness of breath, palpitations, lightheadedness, or dizziness with these spells.   He reports that these spells have been occurring for the past several months but have become more frequent since moving into an assisted living facility in 07/2017.   The assisted living facility has been measuring his BP daily recently and last week his BP was in the 90s/50s during a spell.   They decreased his Bystolic to 5mg  daily this past Friday and since then he has not had any spells and he reports feeling better.   He has had some lower leg edema since his hip replacement in September but it has been improving and his Torsemide has been decreased. However, he is taking a higher dose (daily vs 2 x week than previous). Since he is taking it  daily, is supposed to have repeat BMET, but this has not been done.    Past Medical History:  Diagnosis Date  . Acute gastritis without mention of hemorrhage   . Arthritis   . Atrial fibrillation ([place])   . Cancer ([place])    basal cell skin cancer removed  . Chronic kidney disease, stage III (moderate) (HCC)   . Constipation   . Diverticulosis   . Diverticulosis of colon (without mention of hemorrhage)   . Environmental allergies    HISTORY OF  . Esophageal dysmotility   . Family history of malignant neoplasm of gastrointestinal tract   . GERD (gastroesophageal reflux disease)   . Gout   . Hiatal hernia   . History of skin cancer   . Hypertension   . Hypertrophy of prostate with urinary obstruction and other lower urinary tract symptoms (LUTS)   . Overactive bladder   . Personal history of colonic polyps 10/21/2006   hyperplastic   . Presence of permanent cardiac pacemaker 10/14/05   medtronic Adapta  . Second degree AV block 11/29/2014  . Sinus node dysfunction (HCC)   . Small bowel obstruction ([place])   . Stricture and stenosis of esophagus   . Type II or unspecified type diabetes mellitus without mention of complication, not stated as uncontrolled    pt states that he checks his blood sugar once weekly, is not on oral meds or insulin  .  Unspecified cataract   . Vitamin B12 deficiency     Past Surgical History:  Procedure Laterality Date  . APPENDECTOMY    . CARDIOVERSION  10/01/2011   Procedure: CARDIOVERSION;  Surgeon: Dani Gobble [name];  Location: [facility] OR;  Service: Cardiovascular;  Laterality: N/A;  OP CARDIOVERSION  . CARDIOVERSION  11/02/12   successful  . ESOPHAGOGASTRODUODENOSCOPY (EGD) WITH PROPOFOL N/A 09/22/2014   Procedure: ESOPHAGOGASTRODUODENOSCOPY (EGD) WITH PROPOFOL;  Surgeon: Jerene Bears, MD;  Location: [facility];  Service: Gastroenterology;  Laterality: N/A;  . EYE SURGERY Bilateral years ago   cataract lens replacment  . LAPAROSCOPY     for bowel  obstruction  . PACEMAKER INSERTION  2006   MDT ADAPTA. s/p gen change 2015  . PERMANENT PACEMAKER GENERATOR CHANGE N/A 03/14/2014   MDT ADAPTA. Procedure: PERMANENT PACEMAKER GENERATOR CHANGE;  Surgeon: Sanda Klein, MD; Medtronic Adapta L model number [place], serial number [id number]   . PROSTATE SURGERY    . SAVORY DILATION N/A 09/22/2014   Procedure: SAVORY DILATION;  Surgeon: Jerene Bears, MD;  Location: [facility];  Service: Gastroenterology;  Laterality: N/A;  . TONSILLECTOMY    . TOTAL HIP ARTHROPLASTY Left   . TOTAL HIP ARTHROPLASTY Right 08/17/2017   Procedure: RIGHT TOTAL HIP ARTHROPLASTY ANTERIOR APPROACH;  Surgeon: Rod Can, MD;  Location: [facility];  Service: Orthopedics;  Laterality: Right;  Needs RNFA    Current Outpatient Medications  Medication Sig Dispense Refill  . beta carotene w/minerals (OCUVITE) tablet Take 1 tablet by mouth daily.     Marland Kitchen dronedarone (MULTAQ) 400 MG tablet Take 1 tablet (400 mg total) by mouth 2 (two) times daily with a meal. 180 tablet 3  . magnesium hydroxide (MILK OF MAGNESIA) 400 MG/5ML suspension Take 15 mLs by mouth daily as needed for mild constipation.    . Multiple Vitamins-Minerals (MULTIVITAMIN WITH MINERALS) tablet Take 1 tablet by mouth daily.    . nebivolol (BYSTOLIC) 5 MG tablet Take 1 tablet (5 mg total) by mouth daily. Take one half tab a day 90 tablet 3  . pantoprazole (PROTONIX) 40 MG tablet Take 1 tablet (40 mg total) by mouth daily before breakfast. 90 tablet 3  . Polyethyl Glycol-Propyl Glycol (SYSTANE FREE OP) Place 1 drop into both eyes 2 (two) times daily as needed (dry eyes). For dry eyes     . polyethylene glycol (MIRALAX / GLYCOLAX) packet Take 17 g by mouth daily. (Patient taking differently: Take 17 g by mouth 2 (two) times daily. Mix with 8 oz juice and drink) 14 each 0  . Probiotic Product (PROBIOTIC PO) Take 1 tablet by mouth daily.    . psyllium (HYDROCIL/METAMUCIL) 95 % PACK Take 1 packet by mouth 2 (two) times  daily. 56 each 2  . ramipril (ALTACE) 2.5 MG capsule Take 1 capsule (2.5 mg total) by mouth daily. 90 capsule 3  . sennosides-docusate sodium (SENOKOT-S) 8.6-50 MG tablet Take 1 tablet by mouth daily.    . silodosin (RAPAFLO) 4 MG CAPS capsule Take 2 capsules (8 mg total) by mouth daily with breakfast. (Patient taking differently: Take 8 mg by mouth daily with supper. ) 135 capsule 3  . torsemide (DEMADEX) 10 MG tablet Take 1 tablet (10 mg total) by mouth daily. 30 tablet 0  . vitamin B-12 (CYANOCOBALAMIN) 500 MCG tablet Take 500 mcg by mouth daily.     No current facility-administered medications for this visit.     Allergies:   Codeine    Social History:  The  patient  reports that he quit smoking about 48 years ago. His smoking use included cigarettes. He has a 20.00 pack-year smoking history. he has never used smokeless tobacco. He reports that he does not drink alcohol or use drugs.   Family History:  The patient's family history includes Colon cancer (age of onset: 68) in his brother; Hodgkin's lymphoma in his grandchild; Hypertension in his brother and father; Parkinsonism in his mother.    ROS:  Please see the history of present illness. All other systems are reviewed and negative.    PHYSICAL EXAM: VS:  BP 127/66   Pulse 96   Ht 5\' 6"  (1.676 m)   Wt 180 lb 3.2 oz (81.7 kg)   SpO2 98%   BMI 29.09 kg/m  , BMI Body mass index is 29.09 kg/m. GEN: Well nourished, well developed, male in no acute distress  HEENT: normal for age  Neck: no JVD, no carotid bruit, no masses Cardiac: RRR, borderline tachycardic; no murmur, no rubs, or gallops Respiratory: decreased BS bases bilaterally, normal work of breathing GI: soft, nontender, nondistended, + BS MS: no deformity or atrophy; no edema bilaterally; distal pulses are 2+ in all 4 extremities   Skin: warm and dry, no rash Neuro:  Strength and sensation are intact Psych: euthymic mood, full affect   EKG:  EKG is not ordered  today. Pacemaker was checked today and showed no tachy arrhythmias.  He had no high heart rate episodes. He has PVCs but no VT/SVT. V pacing 92% of the time.   Recent Labs: 03/14/2017: ALT 27 08/20/2017: Hemoglobin 8.5; Platelets 129 11/11/2017: BUN 37; Creatinine, Ser 1.55; Potassium 4.1; Sodium 143    Lipid Panel    Component Value Date/Time   CHOL 152 07/05/2008 2038   TRIG 315 (H) 07/05/2008 2038   HDL 28 (L) 07/05/2008 2038   CHOLHDL 5.4 Ratio 07/05/2008 2038   VLDL 63 (H) 07/05/2008 2038   LDLCALC 61 07/05/2008 2038   LDLDIRECT 84 12/06/2014 1215     Wt Readings from Last 3 Encounters:  12/07/17 180 lb 3.2 oz (81.7 kg)  11/11/17 175 lb 6.4 oz (79.6 kg)  08/17/17 178 lb (80.7 kg)     Other studies Reviewed: Additional studies/ records that were reviewed today include:  ASSESSMENT AND PLAN:  1.  Hypotension - Patient was having frequent spells where he felt very weak with hypotension at assisted living facility. Bystolic was decreased to 5mg  daily this past Friday and the patient has not had any spells and reports feeling better since then. - BP 127/66 today. - Continue Bystolic 5mg  daily, we will send in new rx. Continue Ramipril 2.5mg  daily. - Continue monitoring BP daily at assisted living facility. - Recommended 1-1.5 quarts of fluid daily.  2. Paroxysmal atrial fibrillation - Required DCCV in 2013, RRR on exam. - Continue Dronedarone 400mg  BID - CHA2DS2VASc=4 (age x 2, HTN, DM). Continue Xarelto.  3. Second degree AV block - Cause of high prevalence of ventricular pacing but not pacemaker dependent. Does having underlying ventricular rhythm in 30s to 40s.  4. SSS, s/p Pacemaker - Medtronic Adapta 2006, gen change 2015 - Normal pacemaker function. Continue remote downloads every 3 months and office visit in 6 months.  5. CKD Stage III - Cr 1.55 on 11/11/17 at Dr. Verlon Au office. At that time Dr. Nori Riis ordered repeat BMP in 4 weeks given daily Torsemide. Will  draw BMP today.   Current medicines are reviewed at length with the patient today.  The patient does not have concerns regarding medicines.  The following changes have been made:  no change  Labs/ tests ordered today include:   Orders Placed This Encounter  Procedures  . Basic Metabolic Panel (BMET)     Disposition:   FU with Dr [name]  Signed, Darreld Mclean, Student-PA Rosaria Ferries, PA-C   12/07/2017 12:17 PM    [place] Phone: [phone]; Fax: [phone]  This note was written with the assistance of speech recognition software. Please excuse any transcriptional errors.

## 2017-12-07 NOTE — Patient Instructions (Signed)
Medication Instructions:  Continue current medications  If you need a refill on your cardiac medications before your next appointment, please call your pharmacy.  Labwork: BMP Today HERE IN OUR OFFICE AT [facility]  Take the provided lab slips for you to take with you to the lab for you blood draw.   You will NOT need to fast   You may go to any [facility] lab that is convenient for you however, we do have a lab in our office that is able to assist you. You do NOT need an appointment for our lab. Once in our office lobby there is a podium to the right of the check-in desk where you are to sign-in and ring a doorbell to alert [place] you are here. Lab is open Monday-Friday from 8:00am to 4:00pm; and is closed for lunch from 12:45p-1:45pm   Testing/Procedures: None Ordered  Follow-Up: Your physician wants you to follow-up in: Keep appointment with Dr [name] March 6th @ 9:40 am.   Thank you for choosing [facility] at [employer]!!

## 2017-12-09 ENCOUNTER — Other Ambulatory Visit: Payer: Self-pay

## 2017-12-09 MED ORDER — NEBIVOLOL HCL 5 MG PO TABS: 5.0000 mg | ORAL_TABLET | Freq: Every day | ORAL | 3 refills | Status: DC

## 2017-12-09 NOTE — Progress Notes (Signed)
Thanks, [place]. MCr

## 2017-12-10 ENCOUNTER — Telehealth: Payer: Self-pay

## 2017-12-10 DIAGNOSIS — N183 Chronic kidney disease, stage 3 unspecified: Secondary | ICD-10-CM

## 2017-12-10 DIAGNOSIS — I495 Sick sinus syndrome: Secondary | ICD-10-CM

## 2017-12-10 NOTE — Telephone Encounter (Signed)
-----   Message from Lonn Georgia, PA-C sent at 12/08/2017  9:11 PM EST ----- Pt is in [facility] [place] Please let the family and facility know that his renal function is no better. D/c the Altace Recheck in 3 weeks Thanks

## 2017-12-10 NOTE — Telephone Encounter (Signed)
Spoke with [place], PER DPR, informed her of labs results, med changes and repeat lab work. [name] also asked me to contact [place] and let Donnamarie Poag, RN know the same information I told her. Lab results, med changes and lab request.

## 2017-12-18 ENCOUNTER — Telehealth: Payer: Self-pay | Admitting: Cardiovascular Disease

## 2017-12-18 NOTE — Telephone Encounter (Signed)
Pt c/o Syncope: STAT if syncope occurred within 30 minutes and pt complains of lightheadedness [place] if episode of passing out, completely, today or in last 24 hours   1. Did you pass out today? yes  2. When is the last time you passed out?  12/18/2017  3. Has this occurred multiple times? One   4. Did you have any symptoms prior to passing out? no

## 2017-12-18 NOTE — Telephone Encounter (Signed)
Late entry spoke with [name] this morning and scheduled appt pt is in assisted living and pt states that he has been "passing out for years but recently it has gotten worse" she reports the patient is having what he calls spells where he has weakness, always when sitting. He is in assisted living and they have assessed pt for stroke and all signs are negative.pt has appt with APP next week to be evaluated. She states that she will have facility call if "things get worse".

## 2017-12-31 ENCOUNTER — Ambulatory Visit (INDEPENDENT_AMBULATORY_CARE_PROVIDER_SITE_OTHER): Payer: Medicare Other | Admitting: Physician Assistant

## 2017-12-31 ENCOUNTER — Encounter: Payer: Self-pay | Admitting: Physician Assistant

## 2017-12-31 VITALS — BP 105/54 | HR 76 | Ht 66.0 in | Wt 178.0 lb

## 2017-12-31 DIAGNOSIS — R531 Weakness: Secondary | ICD-10-CM | POA: Diagnosis not present

## 2017-12-31 DIAGNOSIS — Z950 Presence of cardiac pacemaker: Secondary | ICD-10-CM

## 2017-12-31 DIAGNOSIS — I480 Paroxysmal atrial fibrillation: Secondary | ICD-10-CM

## 2017-12-31 DIAGNOSIS — I441 Atrioventricular block, second degree: Secondary | ICD-10-CM

## 2017-12-31 DIAGNOSIS — E119 Type 2 diabetes mellitus without complications: Secondary | ICD-10-CM | POA: Diagnosis not present

## 2017-12-31 DIAGNOSIS — I10 Essential (primary) hypertension: Secondary | ICD-10-CM

## 2017-12-31 NOTE — Patient Instructions (Signed)
Almyra Deforest, PA-C has recommended making the following medication changes: 1. STOP Torsemide May restart Torsemide 10 mg twice weekly (Monday and Thursday) if you have increased swelling of your legs/feet  Your physician recommends that you return for lab work TODAY.  Your physician recommends that you weigh, daily, at the same time every day, and in the same amount of clothing. Please record your daily weights on the handout provided and bring it to your next appointment.  Your physician has requested that you daily monitor and record your blood pressure readings at home. Please use the same machine at the same time of day to check your readings and record them to bring to your follow-up visit.  Please keep your follow-up appointment in March with Dr C.   Please see your primary care physician for generalized weakness.

## 2017-12-31 NOTE — Progress Notes (Signed)
Cardiology Office Note    Date:  01/02/2018   ID:  [name], DOB 09/29/1920, MRN [id number]  PCP:  Dickie La, MD  Cardiologist:  Dr. [name]  Chief Complaint  Patient presents with  . Follow-up    seen for Dr. [name]. Presyncope    History of Present Illness:  [name] is a 84 y.o. male with PMH of PAF, HTN, diet controlled DM II, sinus node dysfunction, high grade 2nd degree AV block, s//p MDT Adapta dual lead PPM (2006, generator change 2015). He is on Multaq and Xarelto for afib.  He is not on systemic anticoagulation due to risk for fall and his advanced age.  His bystolic was recently reduced to 5mg  daily to avoid significant hypotention.   She presents today accompanied by her daughter-in-law.  His complaint is still periodic weakness.  This may occur 2-3 times a day.  He denies any recent syncope, however he says there was one episode where he simply slouched over for a few seconds.  His symptom is always quick onset and self resolve after a few seconds.  He denies any ipsilateral weakness, he says there was one episode he fell to the right side and all other episode he fell to the left side.  I asked him to be seen by his PCP, if this continues, may need MRI of the brain to rule out potential stroke.  My suspicion for stroke is relatively on the lower side.  Although stroke not completely impossible especially given his history of PAF and not on systemic anticoagulation.  Given the recent worsening creatinine, I think most likely cause for his weakness is dehydration.  He used to be on 10 mg torsemide twice weekly, however in the past few months, this was increased to 10 mg on a daily basis.  Since then his creatinine has trended up from 1.0 up to 1.6 recently.  His Altace has since been discontinued.  I will discontinue his torsemide completely.  Ultimately he still may require some fluid pill.  His daughter-in-law will regularly check on him, if he has worsening lower  extremity edema, he will need to re-start on 10 mg torsemide every Monday and Thursday.    Past Medical History:  Diagnosis Date  . Acute gastritis without mention of hemorrhage   . Arthritis   . Atrial fibrillation ([place])   . Cancer ([place])    basal cell skin cancer removed  . Chronic kidney disease, stage III (moderate) (HCC)   . Constipation   . Diverticulosis   . Diverticulosis of colon (without mention of hemorrhage)   . Environmental allergies    HISTORY OF  . Esophageal dysmotility   . Family history of malignant neoplasm of gastrointestinal tract   . GERD (gastroesophageal reflux disease)   . Gout   . Hiatal hernia   . History of skin cancer   . Hypertension   . Hypertrophy of prostate with urinary obstruction and other lower urinary tract symptoms (LUTS)   . Overactive bladder   . Personal history of colonic polyps 10/21/2006   hyperplastic   . Presence of permanent cardiac pacemaker 10/14/05   medtronic Adapta  . Second degree AV block 11/29/2014  . Sinus node dysfunction (HCC)   . Small bowel obstruction ([place])   . Stricture and stenosis of esophagus   . Type II or unspecified type diabetes mellitus without mention of complication, not stated as uncontrolled    pt states that he checks  his blood sugar once weekly, is not on oral meds or insulin  . Unspecified cataract   . Vitamin B12 deficiency     Past Surgical History:  Procedure Laterality Date  . APPENDECTOMY    . CARDIOVERSION  10/01/2011   Procedure: CARDIOVERSION;  Surgeon: Dani Gobble [name];  Location: [facility];  Service: Cardiovascular;  Laterality: N/A;  OP CARDIOVERSION  . CARDIOVERSION  11/02/12   successful  . ESOPHAGOGASTRODUODENOSCOPY (EGD) WITH PROPOFOL N/A 09/22/2014   Procedure: ESOPHAGOGASTRODUODENOSCOPY (EGD) WITH PROPOFOL;  Surgeon: Jerene Bears, MD;  Location: [facility];  Service: Gastroenterology;  Laterality: N/A;  . EYE SURGERY Bilateral years ago   cataract lens replacment  . LAPAROSCOPY      for bowel obstruction  . PACEMAKER INSERTION  2006   MDT ADAPTA. s/p gen change 2015  . PERMANENT PACEMAKER GENERATOR CHANGE N/A 03/14/2014   MDT ADAPTA. Procedure: PERMANENT PACEMAKER GENERATOR CHANGE;  Surgeon: Sanda Klein, MD; Medtronic Adapta L model number [place], serial number [id number]   . PROSTATE SURGERY    . SAVORY DILATION N/A 09/22/2014   Procedure: SAVORY DILATION;  Surgeon: Jerene Bears, MD;  Location: [facility];  Service: Gastroenterology;  Laterality: N/A;  . TONSILLECTOMY    . TOTAL HIP ARTHROPLASTY Left   . TOTAL HIP ARTHROPLASTY Right 08/17/2017   Procedure: RIGHT TOTAL HIP ARTHROPLASTY ANTERIOR APPROACH;  Surgeon: Rod Can, MD;  Location: [facility];  Service: Orthopedics;  Laterality: Right;  Needs RNFA    Current Medications: Outpatient Medications Prior to Visit  Medication Sig Dispense Refill  . beta carotene w/minerals (OCUVITE) tablet Take 1 tablet by mouth daily.     Marland Kitchen dronedarone (MULTAQ) 400 MG tablet Take 1 tablet (400 mg total) by mouth 2 (two) times daily with a meal. 180 tablet 3  . magnesium hydroxide (MILK OF MAGNESIA) 400 MG/5ML suspension Take 15 mLs by mouth daily as needed for mild constipation.    . Multiple Vitamins-Minerals (MULTIVITAMIN WITH MINERALS) tablet Take 1 tablet by mouth daily.    . nebivolol (BYSTOLIC) 5 MG tablet Take 1 tablet (5 mg total) by mouth daily. 90 tablet 3  . pantoprazole (PROTONIX) 40 MG tablet Take 1 tablet (40 mg total) by mouth daily before breakfast. 90 tablet 3  . Polyethyl Glycol-Propyl Glycol (SYSTANE FREE OP) Place 1 drop into both eyes 2 (two) times daily as needed (dry eyes). For dry eyes     . polyethylene glycol (MIRALAX / GLYCOLAX) packet Take 17 g by mouth daily. (Patient taking differently: Take 17 g by mouth 2 (two) times daily. Mix with 8 oz juice and drink) 14 each 0  . Probiotic Product (PROBIOTIC PO) Take 1 tablet by mouth daily.    . psyllium (HYDROCIL/METAMUCIL) 95 % PACK Take 1 packet by mouth  2 (two) times daily. 56 each 2  . sennosides-docusate sodium (SENOKOT-S) 8.6-50 MG tablet Take 1 tablet by mouth daily.    . silodosin (RAPAFLO) 4 MG CAPS capsule Take 2 capsules (8 mg total) by mouth daily with breakfast. (Patient taking differently: Take 8 mg by mouth daily with supper. ) 135 capsule 3  . vitamin B-12 (CYANOCOBALAMIN) 500 MCG tablet Take 500 mcg by mouth daily.    Marland Kitchen torsemide (DEMADEX) 10 MG tablet Take 1 tablet (10 mg total) by mouth daily. 30 tablet 0   No facility-administered medications prior to visit.      Allergies:   Codeine   Social History   Socioeconomic History  . Marital status: Widowed  Spouse name: None  . Number of children: None  . Years of education: None  . Highest education level: None  Social Needs  . Financial resource strain: None  . Food insecurity - worry: None  . Food insecurity - inability: None  . Transportation needs - medical: None  . Transportation needs - non-medical: None  Occupational History  . Occupation: retired    Fish farm manager: RETIRED  Tobacco Use  . Smoking status: Former Smoker    Packs/day: 1.00    Years: 20.00    Pack years: 20.00    Types: Cigarettes    Last attempt to quit: 11/17/1969    Years since quitting: 48.1  . Smokeless tobacco: Never Used  Substance and Sexual Activity  . Alcohol use: No    Alcohol/week: 0.0 oz    Comment: none since 1990  . Drug use: No  . Sexual activity: None  Other Topics Concern  . None  Social History Narrative   Step son is Clerance Lav; Jenny Reichmann and his wife Janean Sark are primary care takers. They live at [address]   [place] [address]   He is in [place] at [facility] in [place].     Family History:  The patient's family history includes Colon cancer (age of onset: 69) in his brother; Hodgkin's lymphoma in his grandchild; Hypertension in his brother and father; Parkinsonism in his mother.   ROS:   Please see the history of present illness.    ROS All  other systems reviewed and are negative.   PHYSICAL EXAM:   VS:  BP (!) 105/54   Pulse 76   Ht 5\' 6"  (1.676 m)   Wt 178 lb (80.7 kg)   BMI 28.73 kg/m    GEN: Well nourished, well developed, in no acute distress  HEENT: normal  Neck: no JVD, carotid bruits, or masses Cardiac: RRR; no murmurs, rubs, or gallops,no edema  Respiratory:  clear to auscultation bilaterally, normal work of breathing GI: soft, nontender, nondistended, + BS MS: no deformity or atrophy  Skin: warm and dry, no rash Neuro:  Alert and Oriented x 3, Strength and sensation are intact Psych: euthymic mood, full affect  Wt Readings from Last 3 Encounters:  12/31/17 178 lb (80.7 kg)  12/07/17 180 lb 3.2 oz (81.7 kg)  11/11/17 175 lb 6.4 oz (79.6 kg)      Studies/Labs Reviewed:   EKG:  EKG is ordered today.  The ekg ordered today demonstrates AV dual paced rhythm heart rate 76  Recent Labs: 03/14/2017: ALT 27 12/07/2017: BUN 33; Creatinine, Ser 1.61; Potassium 4.6; Sodium 145 12/31/2017: Hemoglobin 11.4; Platelets 308   Lipid Panel    Component Value Date/Time   CHOL 152 07/05/2008 2038   TRIG 315 (H) 07/05/2008 2038   HDL 28 (L) 07/05/2008 2038   CHOLHDL 5.4 Ratio 07/05/2008 2038   VLDL 63 (H) 07/05/2008 2038   LDLCALC 61 07/05/2008 2038   LDLDIRECT 84 12/06/2014 1215    Additional studies/ records that were reviewed today include:   Myoview 03/04/2010    ASSESSMENT:    1. Weakness   2. Paroxysmal atrial fibrillation (HCC)   3. Second degree AV block   4. Essential hypertension   5. Controlled type 2 diabetes mellitus without complication, without long-term current use of insulin ([place])   6. Pacemaker      PLAN:  In order of problems listed above:  1. Weakness: Given the recent elevation of Cr and intermittent weakness,  my concerns patient is being dehydrated.  I have discontinued his torsemide, if he does develop lower extremity edema, he can restart torsemide at 10 mg twice weekly  dose.  2. Paroxysmal atrial fibrillation: Controlled on Bystolic, not on systemic anticoagulation due to his advanced age and propensity to fall.  3. History of pacemaker: Heart rhythm is paced  4. Hypertension: ACE inhibitor was recently discontinued after his creatinine trended up.  5. DM 2: Managed by primary care provider    Medication Adjustments/Labs and Tests Ordered: Current medicines are reviewed at length with the patient today.  Concerns regarding medicines are outlined above.  Medication changes, Labs and Tests ordered today are listed in the Patient Instructions below. Patient Instructions  Almyra Deforest, PA-C has recommended making the following medication changes: 1. STOP Torsemide May restart Torsemide 10 mg twice weekly (Monday and Thursday) if you have increased swelling of your legs/feet  Your physician recommends that you return for lab work TODAY.  Your physician recommends that you weigh, daily, at the same time every day, and in the same amount of clothing. Please record your daily weights on the handout provided and bring it to your next appointment.  Your physician has requested that you daily monitor and record your blood pressure readings at home. Please use the same machine at the same time of day to check your readings and record them to bring to your follow-up visit.  Please keep your follow-up appointment in March with Dr C.   Please see your primary care physician for generalized weakness.    Hilbert Corrigan, [place]  01/02/2018 10:51 AM    [address], [place], [place]  [address] Phone: [phone]; Fax: [phone]

## 2018-01-01 LAB — CBC
HEMATOCRIT: 36.5 % — AB (ref 37.5–51.0)
Hemoglobin: 11.4 g/dL — ABNORMAL LOW (ref 13.0–17.7)
MCH: 29 pg (ref 26.6–33.0)
MCHC: 31.2 g/dL — AB (ref 31.5–35.7)
MCV: 93 fL (ref 79–97)
Platelets: 308 10*3/uL (ref 150–379)
RBC: 3.93 x10E6/uL — ABNORMAL LOW (ref 4.14–5.80)
RDW: 14.4 % (ref 12.3–15.4)
WBC: 11.2 10*3/uL — ABNORMAL HIGH (ref 3.4–10.8)

## 2018-01-02 ENCOUNTER — Encounter: Payer: Self-pay | Admitting: Physician Assistant

## 2018-01-04 NOTE — Progress Notes (Signed)
Thank you MCr 

## 2018-01-06 ENCOUNTER — Encounter: Payer: Self-pay | Admitting: Family Medicine

## 2018-01-06 ENCOUNTER — Other Ambulatory Visit: Payer: Self-pay

## 2018-01-06 ENCOUNTER — Ambulatory Visit (INDEPENDENT_AMBULATORY_CARE_PROVIDER_SITE_OTHER): Payer: Medicare Other | Admitting: Family Medicine

## 2018-01-06 DIAGNOSIS — I10 Essential (primary) hypertension: Secondary | ICD-10-CM | POA: Diagnosis present

## 2018-01-06 DIAGNOSIS — K222 Esophageal obstruction: Secondary | ICD-10-CM

## 2018-01-08 ENCOUNTER — Encounter: Payer: Self-pay | Admitting: Family Medicine

## 2018-01-08 ENCOUNTER — Telehealth: Payer: Self-pay

## 2018-01-08 NOTE — Assessment & Plan Note (Signed)
Dizzy episode better so likely he was intravascularly volume depleted. He has some mild LE edema. We discussed and will do diuretic one day a week.

## 2018-01-08 NOTE — Progress Notes (Signed)
    CHIEF COMPLAINT / HPI:  discussion about diuretic. Cardiology stopped it and his dizzy spells improved. He has nted some mild LE edema.  Having occasional sensation of food stuck in his throat after meals--has some coughing. Not r\eally haivng a problem with food sticking as he eats. He recalls his GI doctor saying at last procedure for stretching esophageal stricture that they had "just about stretched it as much as they could". No heartburn     OBJECTIVE:  Vital signs reviewed. GENERAL: Well-developed, well-nourished, no acute distress. CARDIOVASCULAR: IRR IRR LUNGS: Normal respiratory effort.Normal respiratory rate.. ABDOMEN: Soft positive bowel sounds MSK: Movement of extremity x 4. Rises from a chair essentiall without assistance but uses walker for balance and leverage. NECK No LAD. No thyromegally  ASSESSMENT / PLAN: Please see problem oriented charting for details

## 2018-01-08 NOTE — Telephone Encounter (Signed)
Samuel Ferries, PA: He was supposed to get a BMET at his last office visit but did not.  Nursing home in [place]. Can you see if they can draw that and get [place] the results?  Thanks   I called and spoke with [name], patients daughter in law and she said the patient most had the test done it was just not faxed to [place]. She stated she was going to the facility today and would have his charge nurse give me a call.

## 2018-01-08 NOTE — Telephone Encounter (Signed)
BMET reviewed. Please let them know that his creatinine improved off the Altace. Continue current therapy, keep follow-up appointments. Thanks

## 2018-01-08 NOTE — Telephone Encounter (Signed)
Received labs from [facility] to review.

## 2018-01-08 NOTE — Assessment & Plan Note (Signed)
His sx o\are moderate. He decided to wait and watch a little longer. If sx worsen, he needs to contact his GI doctor. Sounds like GI was not really wanting todo dilation again if they could avoid. Continue current PPI

## 2018-01-14 ENCOUNTER — Inpatient Hospital Stay (HOSPITAL_COMMUNITY)
Admission: EM | Admit: 2018-01-14 | Discharge: 2018-01-27 | DRG: 177 | Disposition: A | Discharge status: Hospice | Payer: Medicare Other | Attending: Family Medicine | Admitting: Family Medicine

## 2018-01-14 ENCOUNTER — Encounter (HOSPITAL_COMMUNITY): Payer: Self-pay | Admitting: Emergency Medicine

## 2018-01-14 ENCOUNTER — Other Ambulatory Visit: Payer: Self-pay

## 2018-01-14 ENCOUNTER — Emergency Department (HOSPITAL_COMMUNITY): Payer: Medicare Other

## 2018-01-14 DIAGNOSIS — R1319 Other dysphagia: Secondary | ICD-10-CM

## 2018-01-14 DIAGNOSIS — E1122 Type 2 diabetes mellitus with diabetic chronic kidney disease: Secondary | ICD-10-CM | POA: Diagnosis present

## 2018-01-14 DIAGNOSIS — Z515 Encounter for palliative care: Secondary | ICD-10-CM | POA: Diagnosis not present

## 2018-01-14 DIAGNOSIS — Z96643 Presence of artificial hip joint, bilateral: Secondary | ICD-10-CM | POA: Diagnosis present

## 2018-01-14 DIAGNOSIS — I130 Hypertensive heart and chronic kidney disease with heart failure and stage 1 through stage 4 chronic kidney disease, or unspecified chronic kidney disease: Secondary | ICD-10-CM | POA: Diagnosis present

## 2018-01-14 DIAGNOSIS — Z86010 Personal history of colonic polyps: Secondary | ICD-10-CM | POA: Diagnosis not present

## 2018-01-14 DIAGNOSIS — R918 Other nonspecific abnormal finding of lung field: Secondary | ICD-10-CM | POA: Insufficient documentation

## 2018-01-14 DIAGNOSIS — R1312 Dysphagia, oropharyngeal phase: Secondary | ICD-10-CM | POA: Diagnosis present

## 2018-01-14 DIAGNOSIS — J9621 Acute and chronic respiratory failure with hypoxia: Secondary | ICD-10-CM | POA: Diagnosis present

## 2018-01-14 DIAGNOSIS — R7989 Other specified abnormal findings of blood chemistry: Secondary | ICD-10-CM | POA: Diagnosis not present

## 2018-01-14 DIAGNOSIS — I2721 Secondary pulmonary arterial hypertension: Secondary | ICD-10-CM | POA: Diagnosis present

## 2018-01-14 DIAGNOSIS — J690 Pneumonitis due to inhalation of food and vomit: Secondary | ICD-10-CM | POA: Diagnosis not present

## 2018-01-14 DIAGNOSIS — K449 Diaphragmatic hernia without obstruction or gangrene: Secondary | ICD-10-CM | POA: Diagnosis present

## 2018-01-14 DIAGNOSIS — K219 Gastro-esophageal reflux disease without esophagitis: Secondary | ICD-10-CM | POA: Diagnosis present

## 2018-01-14 DIAGNOSIS — M109 Gout, unspecified: Secondary | ICD-10-CM | POA: Diagnosis present

## 2018-01-14 DIAGNOSIS — Z85828 Personal history of other malignant neoplasm of skin: Secondary | ICD-10-CM | POA: Diagnosis not present

## 2018-01-14 DIAGNOSIS — I5031 Acute diastolic (congestive) heart failure: Secondary | ICD-10-CM | POA: Diagnosis present

## 2018-01-14 DIAGNOSIS — I959 Hypotension, unspecified: Secondary | ICD-10-CM | POA: Diagnosis not present

## 2018-01-14 DIAGNOSIS — Z66 Do not resuscitate: Secondary | ICD-10-CM | POA: Diagnosis present

## 2018-01-14 DIAGNOSIS — Z950 Presence of cardiac pacemaker: Secondary | ICD-10-CM | POA: Diagnosis present

## 2018-01-14 DIAGNOSIS — Z820 Family history of epilepsy and other diseases of the nervous system: Secondary | ICD-10-CM | POA: Diagnosis not present

## 2018-01-14 DIAGNOSIS — R05 Cough: Secondary | ICD-10-CM

## 2018-01-14 DIAGNOSIS — D72829 Elevated white blood cell count, unspecified: Secondary | ICD-10-CM

## 2018-01-14 DIAGNOSIS — R1314 Dysphagia, pharyngoesophageal phase: Secondary | ICD-10-CM | POA: Diagnosis not present

## 2018-01-14 DIAGNOSIS — K224 Dyskinesia of esophagus: Secondary | ICD-10-CM | POA: Diagnosis not present

## 2018-01-14 DIAGNOSIS — I480 Paroxysmal atrial fibrillation: Secondary | ICD-10-CM | POA: Diagnosis present

## 2018-01-14 DIAGNOSIS — K5900 Constipation, unspecified: Secondary | ICD-10-CM | POA: Diagnosis not present

## 2018-01-14 DIAGNOSIS — Z885 Allergy status to narcotic agent status: Secondary | ICD-10-CM

## 2018-01-14 DIAGNOSIS — I5030 Unspecified diastolic (congestive) heart failure: Secondary | ICD-10-CM | POA: Diagnosis present

## 2018-01-14 DIAGNOSIS — R9431 Abnormal electrocardiogram [ECG] [EKG]: Secondary | ICD-10-CM | POA: Diagnosis present

## 2018-01-14 DIAGNOSIS — I495 Sick sinus syndrome: Secondary | ICD-10-CM | POA: Diagnosis present

## 2018-01-14 DIAGNOSIS — Z8249 Family history of ischemic heart disease and other diseases of the circulatory system: Secondary | ICD-10-CM

## 2018-01-14 DIAGNOSIS — E1136 Type 2 diabetes mellitus with diabetic cataract: Secondary | ICD-10-CM | POA: Diagnosis present

## 2018-01-14 DIAGNOSIS — I361 Nonrheumatic tricuspid (valve) insufficiency: Secondary | ICD-10-CM | POA: Diagnosis not present

## 2018-01-14 DIAGNOSIS — H409 Unspecified glaucoma: Secondary | ICD-10-CM | POA: Diagnosis present

## 2018-01-14 DIAGNOSIS — R54 Age-related physical debility: Secondary | ICD-10-CM | POA: Diagnosis present

## 2018-01-14 DIAGNOSIS — N183 Chronic kidney disease, stage 3 (moderate): Secondary | ICD-10-CM | POA: Diagnosis present

## 2018-01-14 DIAGNOSIS — E871 Hypo-osmolality and hyponatremia: Secondary | ICD-10-CM | POA: Diagnosis not present

## 2018-01-14 DIAGNOSIS — R1310 Dysphagia, unspecified: Secondary | ICD-10-CM | POA: Diagnosis not present

## 2018-01-14 DIAGNOSIS — Z87891 Personal history of nicotine dependence: Secondary | ICD-10-CM | POA: Diagnosis not present

## 2018-01-14 DIAGNOSIS — Z800 Family history of malignant neoplasm of digestive organs: Secondary | ICD-10-CM

## 2018-01-14 DIAGNOSIS — K222 Esophageal obstruction: Secondary | ICD-10-CM | POA: Diagnosis not present

## 2018-01-14 DIAGNOSIS — R0602 Shortness of breath: Secondary | ICD-10-CM | POA: Diagnosis present

## 2018-01-14 DIAGNOSIS — E538 Deficiency of other specified B group vitamins: Secondary | ICD-10-CM | POA: Diagnosis present

## 2018-01-14 DIAGNOSIS — R058 Other specified cough: Secondary | ICD-10-CM | POA: Insufficient documentation

## 2018-01-14 DIAGNOSIS — I482 Chronic atrial fibrillation: Secondary | ICD-10-CM | POA: Diagnosis present

## 2018-01-14 DIAGNOSIS — Z807 Family history of other malignant neoplasms of lymphoid, hematopoietic and related tissues: Secondary | ICD-10-CM | POA: Diagnosis not present

## 2018-01-14 DIAGNOSIS — D649 Anemia, unspecified: Secondary | ICD-10-CM | POA: Diagnosis not present

## 2018-01-14 DIAGNOSIS — I10 Essential (primary) hypertension: Secondary | ICD-10-CM

## 2018-01-14 DIAGNOSIS — R0902 Hypoxemia: Secondary | ICD-10-CM | POA: Diagnosis present

## 2018-01-14 DIAGNOSIS — L89151 Pressure ulcer of sacral region, stage 1: Secondary | ICD-10-CM | POA: Diagnosis present

## 2018-01-14 DIAGNOSIS — E876 Hypokalemia: Secondary | ICD-10-CM | POA: Diagnosis not present

## 2018-01-14 HISTORY — DX: Unspecified diastolic (congestive) heart failure: I50.30

## 2018-01-14 LAB — BASIC METABOLIC PANEL
ANION GAP: 12 (ref 5–15)
BUN: 25 mg/dL — ABNORMAL HIGH (ref 6–20)
CO2: 21 mmol/L — AB (ref 22–32)
Calcium: 8.3 mg/dL — ABNORMAL LOW (ref 8.9–10.3)
Chloride: 99 mmol/L — ABNORMAL LOW (ref 101–111)
Creatinine, Ser: 1.28 mg/dL — ABNORMAL HIGH (ref 0.61–1.24)
GFR calc Af Amer: 53 mL/min — ABNORMAL LOW (ref >60)
GFR calc non Af Amer: 46 mL/min — ABNORMAL LOW (ref >60)
GLUCOSE: 135 mg/dL — AB (ref 65–99)
POTASSIUM: 4.4 mmol/L (ref 3.5–5.1)
Sodium: 132 mmol/L — ABNORMAL LOW (ref 135–145)

## 2018-01-14 LAB — I-STAT TROPONIN, ED: Troponin i, poc: 0 ng/mL (ref 0.00–0.08)

## 2018-01-14 LAB — CBC
HEMATOCRIT: 26.1 % — AB (ref 39.0–52.0)
HEMOGLOBIN: 8.7 g/dL — AB (ref 13.0–17.0)
MCH: 29.3 pg (ref 26.0–34.0)
MCHC: 33.3 g/dL (ref 30.0–36.0)
MCV: 87.9 fL (ref 78.0–100.0)
Platelets: 318 10*3/uL (ref 150–400)
RBC: 2.97 MIL/uL — ABNORMAL LOW (ref 4.22–5.81)
RDW: 15 % (ref 11.5–15.5)
WBC: 12.9 10*3/uL — ABNORMAL HIGH (ref 4.0–10.5)

## 2018-01-14 LAB — BRAIN NATRIURETIC PEPTIDE: B Natriuretic Peptide: 854.6 pg/mL — ABNORMAL HIGH (ref 0.0–100.0)

## 2018-01-14 LAB — TROPONIN I
TROPONIN I: 0.03 ng/mL — AB (ref <0.03)
Troponin I: 0.03 ng/mL (ref <0.03)

## 2018-01-14 LAB — I-STAT CG4 LACTIC ACID, ED: Lactic Acid, Venous: 1.58 mmol/L (ref 0.5–1.9)

## 2018-01-14 MED ORDER — FUROSEMIDE 10 MG/ML IJ SOLN
40.0000 mg | Freq: Once | INTRAMUSCULAR | Status: AC
  Administered 2018-01-14: 40 mg via INTRAVENOUS
  Filled 2018-01-14: qty 4

## 2018-01-14 MED ORDER — NEBIVOLOL HCL 5 MG PO TABS
5.0000 mg | ORAL_TABLET | Freq: Every day | ORAL | Status: DC
  Administered 2018-01-14 – 2018-01-25 (×12): 5 mg via ORAL
  Filled 2018-01-14 (×12): qty 1

## 2018-01-14 MED ORDER — PSYLLIUM 95 % PO PACK
1.0000 | PACK | Freq: Two times a day (BID) | ORAL | Status: DC
  Administered 2018-01-14 – 2018-01-18 (×9): 1 via ORAL
  Filled 2018-01-14 (×11): qty 1

## 2018-01-14 MED ORDER — DRONEDARONE HCL 400 MG PO TABS
400.0000 mg | ORAL_TABLET | Freq: Two times a day (BID) | ORAL | Status: DC
  Administered 2018-01-14 – 2018-01-25 (×21): 400 mg via ORAL
  Filled 2018-01-14 (×22): qty 1

## 2018-01-14 MED ORDER — POLYETHYLENE GLYCOL 3350 17 G PO PACK
17.0000 g | PACK | Freq: Every day | ORAL | Status: DC
  Administered 2018-01-14 – 2018-01-17 (×4): 17 g via ORAL
  Filled 2018-01-14 (×4): qty 1

## 2018-01-14 MED ORDER — AMOXICILLIN-POT CLAVULANATE 250-62.5 MG/5ML PO SUSR
500.0000 mg | Freq: Two times a day (BID) | ORAL | Status: DC
  Administered 2018-01-14 – 2018-01-16 (×5): 500 mg via ORAL
  Filled 2018-01-14 (×8): qty 10

## 2018-01-14 MED ORDER — TAMSULOSIN HCL 0.4 MG PO CAPS
0.4000 mg | ORAL_CAPSULE | Freq: Every day | ORAL | Status: DC
  Administered 2018-01-14 – 2018-01-27 (×14): 0.4 mg via ORAL
  Filled 2018-01-14 (×14): qty 1

## 2018-01-14 MED ORDER — ENOXAPARIN SODIUM 30 MG/0.3ML ~~LOC~~ SOLN
30.0000 mg | SUBCUTANEOUS | Status: DC
  Administered 2018-01-14 – 2018-01-24 (×11): 30 mg via SUBCUTANEOUS
  Filled 2018-01-14 (×11): qty 0.3

## 2018-01-14 NOTE — Progress Notes (Addendum)
Family Medicine Teaching Service Daily Progress Note Intern Pager: [phone]  Patient name: [name] Medical record number: [id number] Date of birth: [birth year] Age: 84 y.o. Gender: male  Primary Care Provider: Dickie La, MD Consultants: None Code Status: DNR  Pt Overview and Major Events to Date:  Admitted to [facility] on 01/14/18   Assessment and Plan: [name] is a 84 y.o. male presenting with SOB. PMH is significant for PAF, HTN, T2DM, sinus node dysfunction, CKD III.   SOB - improving  Patient reports improvement after lasix. Likely 2/2 to fluid overload given recent discontinuation of torsemide and elevated BNP to 854 on admission, however no h/o CHF diagnosis. CXR on admission also showing pulmonary edema and/or PNA. Patient is satting at 90% on 2L O2. Patient is net negative -2.9L overnight s/p 80mg  IV lasix. Current weight 175lbs (Weight 178 lbs on 2/14). Some concern for aspiration PNA given CXR findings, current WBC of 12.0 improved from 12.9 on 2/28.   -continuous pulse ox, supplemental O2 prn  -cardiology consulted, would like to be called after echo is complete  -continue empiric coverage with augmentin (2/28- ) given elderly age and risk of aspiration pneumonia.  -echo ordered  -strict I/Os and daily weights  -will give 1 dose 40mg  IV lasix for continued diuresis. Will administer KDur to avoid hypokalemia. Will monitor Mg given low normal K.   H/o esophageal stricture Followed by [facility]. Per patient, has follow up scheduled for next week with possible dilation. Possible etiology of aspiration pneumonia.  -GI consulted for stricture, appreciate recommendations -dysphagia 3 diet per speech   Chest Pain - resolved Likely related to dyspnea as above. EKG with paced rhythm with no ST changes. Well's score of 0 so doubt PE as etiology of chest pain. Troponins trended at 0.03>0.03>0.03.  -am EKG pending  Atrial fibrillation Chronic, stable. Pacemaker  interrogated 1/21 that showed normal device function and no arrhythmias. No episodes of afib. CHADSVasc score of 4 on admission.HASBLED score of 2. Was previously on Xarelto for anticoagulation but cardiology note states no longer a candidate for anticoagulation due to advanced age and propensity to fall. Managed with dronedarone 400mg  tablet bid, and nebivolol 5 mg daily.  -continue dronedarone 400mg  tablet bid. Cardiology consulted on whether medication should be continued if patient has evidence of HF or LVH. Cardiology would like to be called after echo  -Continue nebivolol 5 mg daily  Sinus node dysfunction/av block/Pacemaker Patient with prolonged QT/qtc interval on past ekgs. QTc >550 on admission. Pacing would hopefully deter some if not all arrhythmias. -continue dronederone400 mg twice a day, discontinuation pending cardiology consult.  -continue nebivolol5 mg daily  -Avoid QT prolonging medications  Hypertension Currently with low diastolic at 448/18. Home meds: nebivolol. -nebivolol 5 mg  CKD Stage III Current Cr of 1.36 with GFR of 43. Cr 1.28 with GFR 46 at admission. Appears consistent with bl Cr 1.1-1.3.  -daily bmp -watch Cr with IV diuresis   Type II Diabetes Patient with past diagnosis of T2DM.Last A1c 5.8.Does not take any oral medication or insulin. Glucose 135 on BMET at admission.  -monitor CBG daily, add SSI if necessary   Sacral ulcer, stage 1 New stage 1 pressure ulcer, likely 2/2 decreased motility.  -will need frequent positional changes -apply foam dressing to relieve pressure -will change mattress to avoid increased pressure   FEN/GI:Dysphagia 3 diet Prophylaxis:Lovenox    Disposition: continued inpatient stay for cardiac workup   Subjective:  Patient today  states his SOB is improved. Reports that at home is on room air. Denies chest pain. States his mouth feels dry.   Objective: Temp:  [98.5 F (36.9 C)-98.8 F (37.1 C)] 98.8 F (37.1  C) (02/28 2116) Pulse Rate:  [64-84] 71 (02/28 2116) Resp:  [12-22] 22 (02/28 2116) BP: (123-146)/(44-95) 123/44 (02/28 2116) SpO2:  [83 %-100 %] 92 % (03/01 0940) Weight:  [175 lb 7.8 oz (79.6 kg)-177 lb 14.6 oz (80.7 kg)] 175 lb 7.8 oz (79.6 kg) (03/01 0500) Physical Exam: General: awake and alert, laying in bed, [place] in place, NAD  Cardiovascular: RRR, no murmurs  Respiratory: diffuses crackles throughout, course breath sounds throughout  Abdomen: soft, non tender, non distended, bowel sounds normal  Extremities: non tender, no edema  Skin: stage 1 pressure ulcer   Laboratory: Recent Labs  Lab 01/14/18 0729 01/15/18 0232  WBC 12.9* 12.0*  HGB 8.7* 8.8*  HCT 26.1* 26.2*  PLT 318 349   Recent Labs  Lab 01/14/18 0729 01/15/18 0232  NA 132* 135  K 4.4 3.5  CL 99* 98*  CO2 21* 24  BUN 25* 29*  CREATININE 1.28* 1.36*  CALCIUM 8.3* 8.0*  GLUCOSE 135* 134*    Ref. Range 01/14/2018 14:58 01/14/2018 21:02 01/15/2018 02:32  Troponin I Latest Ref Range: <0.03 ng/mL 0.03 (HH) 0.03 (HH) 0.03 (HH)    Ref. Range 01/15/2018 02:32  TSH Latest Ref Range: 0.350 - 4.500 uIU/mL 1.946    Imaging/Diagnostic Tests: Dg Chest 2 View  Result Date: 01/14/2018 CLINICAL DATA:  Shortness of breath. EXAM: CHEST  2 VIEW COMPARISON:  Chest x-ray 03/14/2017. FINDINGS: Cardiac pacer with lead tip over the right atrium right ventricle. Cardiomegaly. Diffuse bilateral pulmonary infiltrates are noted consistent with pulmonary edema and/or pneumonia. Pulmonary infiltrates are primarily in the upper lobes. Small bilateral pleural effusions. No acute bony abnormality. IMPRESSION: Cardiac pacer noted stable position. Cardiomegaly. Diffuse bilateral pulmonary infiltrates, primarily in the upper lobes. Findings are consistent pulmonary edema and/or pneumonia. Small bilateral pleural effusions. Electronically Signed   By: Marcello Moores  Register   On: 01/14/2018 07:51     Caroline More, DO 01/15/2018, 10:49 AM PGY-1,  [facility] Intern pager: [phone], text pages welcome

## 2018-01-14 NOTE — ED Notes (Signed)
Called 5W to give report.  Receiving RN unable to take report at this time. 

## 2018-01-14 NOTE — ED Provider Notes (Signed)
[facility] EMERGENCY DEPARTMENT Provider Note   CSN: [id number] Arrival date & time: 01/14/18  0708     History   Chief Complaint Chief Complaint  Patient presents with  . Shortness of Breath    HPI [name] is a 84 y.o. male.  HPI  [name] is a 84 y.o. male with PMH of PAF, HTN, diet controlled DM II, pacemaker, on Multaq andno systemic anticoagulation due to falls.  He is presenting today with shortness of breath.  Patient recently saw cardiology and though his turosemide was scheduled every day, they went down to once a week.  With the idea that they could slowly increase as needed at home.  However patient became acutely short of breath over the last 3-4 days and has not increased his fluid pill.  Today patient had mild chest pain, shortness breath.  Noted to be 80% on room air when EMS arrived.   Past Medical History:  Diagnosis Date  . Acute gastritis without mention of hemorrhage   . Arthritis   . Atrial fibrillation ([place])   . Cancer ([place])    basal cell skin cancer removed  . Chronic kidney disease, stage III (moderate) (HCC)   . Constipation   . Diverticulosis   . Diverticulosis of colon (without mention of hemorrhage)   . Environmental allergies    HISTORY OF  . Esophageal dysmotility   . Family history of malignant neoplasm of gastrointestinal tract   . GERD (gastroesophageal reflux disease)   . Gout   . Hiatal hernia   . History of skin cancer   . Hypertension   . Hypertrophy of prostate with urinary obstruction and other lower urinary tract symptoms (LUTS)   . Overactive bladder   . Personal history of colonic polyps 10/21/2006   hyperplastic   . Presence of permanent cardiac pacemaker 10/14/05   medtronic Adapta  . Second degree AV block 11/29/2014  . Sinus node dysfunction (HCC)   . Small bowel obstruction ([place])   . Stricture and stenosis of esophagus   . Type II or unspecified type diabetes mellitus without mention  of complication, not stated as uncontrolled    pt states that he checks his blood sugar once weekly, is not on oral meds or insulin  . Unspecified cataract   . Vitamin B12 deficiency     Patient Active Problem List   Diagnosis Date Noted  . Postop check   . Elective surgery   . Normocytic anemia   . Essential hypertension   . Second degree AV block 11/29/2014  . Abnormal esophagram 09/07/2014  . Pacemaker - Medtronic Adapta 2006, gen change 2015 07/23/2013  . Chronic right hip pain 09/22/2012  . Testicular swelling, right 09/22/2012  . Presbycusis 05/14/2012  . Paroxysmal atrial fibrillation ([place]) 01/30/2012  . Hx SBO 01/30/2012  . S/P appendectomy 01/30/2012  . CHRONIC KIDNEY DISEASE STAGE III (MODERATE) 12/03/2010  . ALLERGIC RHINITIS 09/05/2009  . HOARSENESS 05/15/2009  . HIATAL HERNIA 12/14/2008  . Diverticulosis of large intestine 12/14/2008  . COLONIC POLYPS, HYPERPLASTIC, HX OF 12/14/2008  . ESOPHAGEAL STRICTURE 10/04/2008  . Other vitamin B12 deficiency anemia 01/26/2008  . GLAUCOMA 01/14/2007  . CATARACT 01/14/2007  . SICK SINUS SYNDROME 01/14/2007  . GASTROESOPHAGEAL REFLUX, NO ESOPHAGITIS 01/14/2007    Past Surgical History:  Procedure Laterality Date  . APPENDECTOMY    . CARDIOVERSION  10/01/2011   Procedure: CARDIOVERSION;  Surgeon: Dani Gobble [name];  Location: [place];  Service: Cardiovascular;  Laterality: N/A;  OP CARDIOVERSION  . CARDIOVERSION  11/02/12   successful  . ESOPHAGOGASTRODUODENOSCOPY (EGD) WITH PROPOFOL N/A 09/22/2014   Procedure: ESOPHAGOGASTRODUODENOSCOPY (EGD) WITH PROPOFOL;  Surgeon: Jerene Bears, MD;  Location: [facility];  Service: Gastroenterology;  Laterality: N/A;  . EYE SURGERY Bilateral years ago   cataract lens replacment  . LAPAROSCOPY     for bowel obstruction  . PACEMAKER INSERTION  2006   MDT ADAPTA. s/p gen change 2015  . PERMANENT PACEMAKER GENERATOR CHANGE N/A 03/14/2014   MDT ADAPTA. Procedure: PERMANENT PACEMAKER  GENERATOR CHANGE;  Surgeon: Sanda Klein, MD; Medtronic Adapta L model number [place], serial number [id number]   . PROSTATE SURGERY    . SAVORY DILATION N/A 09/22/2014   Procedure: SAVORY DILATION;  Surgeon: Jerene Bears, MD;  [facility];  Service: Gastroenterology;  Laterality: N/A;  . TONSILLECTOMY    . TOTAL HIP ARTHROPLASTY Left   . TOTAL HIP ARTHROPLASTY Right 08/17/2017   Procedure: RIGHT TOTAL HIP ARTHROPLASTY ANTERIOR APPROACH;  Surgeon: Rod Can, MD;  Location: [place];  Service: Orthopedics;  Laterality: Right;  Needs RNFA       Home Medications    Prior to Admission medications   Medication Sig Start Date End Date Taking? Authorizing Provider  beta carotene w/minerals (OCUVITE) tablet Take 1 tablet by mouth daily.     [provider]  dronedarone (MULTAQ) 400 MG tablet Take 1 tablet (400 mg total) by mouth 2 (two) times daily with a meal. 10/23/17   Dickie La, MD  magnesium hydroxide (MILK OF MAGNESIA) 400 MG/5ML suspension Take 15 mLs by mouth daily as needed for mild constipation.    [provider]  Multiple Vitamins-Minerals (MULTIVITAMIN WITH MINERALS) tablet Take 1 tablet by mouth daily.    [provider]  nebivolol (BYSTOLIC) 5 MG tablet Take 1 tablet (5 mg total) by mouth daily. 12/09/17   [name], Mihai, MD  pantoprazole (PROTONIX) 40 MG tablet Take 1 tablet (40 mg total) by mouth daily before breakfast. 11/19/16   Dickie La, MD  Polyethyl Glycol-Propyl Glycol (SYSTANE FREE OP) Place 1 drop into both eyes 2 (two) times daily as needed (dry eyes). For dry eyes     [provider]  polyethylene glycol (MIRALAX / GLYCOLAX) packet Take 17 g by mouth daily. Patient taking differently: Take 17 g by mouth 2 (two) times daily. Mix with 8 oz juice and drink 08/30/15   Haney, Yetta Flock A, MD  Probiotic Product (PROBIOTIC PO) Take 1 tablet by mouth daily.    [provider]  psyllium (HYDROCIL/METAMUCIL) 95 % PACK Take  1 packet by mouth 2 (two) times daily. 03/16/17   Carlyle Dolly, MD  sennosides-docusate sodium (SENOKOT-S) 8.6-50 MG tablet Take 1 tablet by mouth daily.    [provider]  silodosin (RAPAFLO) 4 MG CAPS capsule Take 2 capsules (8 mg total) by mouth daily with breakfast. Patient taking differently: Take 8 mg by mouth daily with supper.  02/06/17   Katheren Shams, DO  vitamin B-12 (CYANOCOBALAMIN) 500 MCG tablet Take 500 mcg by mouth daily.    [provider]    Family History Family History  Problem Relation Age of Onset  . Parkinsonism Mother   . Hypertension Father   . Colon cancer Brother 18  . Hypertension Brother   . Hodgkin's lymphoma Grandchild     Social History Social History   Tobacco Use  . Smoking status: Former Smoker  Packs/day: 1.00    Years: 20.00    Pack years: 20.00    Types: Cigarettes    Last attempt to quit: 11/17/1969    Years since quitting: 48.1  . Smokeless tobacco: Never Used  Substance Use Topics  . Alcohol use: No    Alcohol/week: 0.0 oz    Comment: none since 1990  . Drug use: No     Allergies   Codeine   Review of Systems Review of Systems  Constitutional: Positive for fatigue. Negative for activity change and fever.  Respiratory: Positive for cough and shortness of breath.   Cardiovascular: Positive for chest pain and leg swelling.  Gastrointestinal: Negative for abdominal pain.     Physical Exam Updated Vital Signs There were no vitals taken for this visit.  Physical Exam  Constitutional: He is oriented to person, place, and time. He appears well-nourished.  HENT:  Head: Normocephalic.  Eyes: Conjunctivae are normal.  Cardiovascular: Normal rate.  Pulmonary/Chest: Accessory muscle usage present. Tachypnea noted. He is in respiratory distress. He has rales.  Neurological: He is oriented to person, place, and time.  Skin: Skin is warm and dry. He is not diaphoretic.  Psychiatric: He has a normal mood  and affect. His behavior is normal.     ED Treatments / Results  Labs (all labs ordered are listed, but only abnormal results are displayed) Labs Reviewed  BASIC METABOLIC PANEL  CBC  BRAIN NATRIURETIC PEPTIDE  I-STAT TROPONIN, ED  I-STAT CG4 LACTIC ACID, ED  I-STAT TROPONIN, ED    EKG  EKG Interpretation  Date/Time:  Thursday January 14 2018 07:20:39 EST Ventricular Rate:  80 PR Interval:    QRS Duration: 165 QT Interval:  480 QTC Calculation: 554 R Axis:   -86 Text Interpretation:  A-V dual-paced rhythm with some inhibition No further analysis attempted due to paced rhythm paced rhythm noted on ekg Confirmed by Thomasene Lot, [place] [phone]) on 01/14/2018 7:37:44 AM       Radiology No results found.  Procedures Procedures (including critical care time)  CRITICAL CARE Performed by: Gardiner Sleeper Total critical care time: 30 minutes Critical care time was exclusive of separately billable procedures and treating other patients. Critical care was necessary to treat or prevent imminent or life-threatening deterioration. Critical care was time spent personally by me on the following activities: development of treatment plan with patient and/or surrogate as well as nursing, discussions with consultants, evaluation of patient's response to treatment, examination of patient, obtaining history from patient or surrogate, ordering and performing treatments and interventions, ordering and review of laboratory studies, ordering and review of radiographic studies, pulse oximetry and re-evaluation of patient's condition.   Medications Ordered in ED Medications  furosemide (LASIX) injection 40 mg (not administered)     Initial Impression / Assessment and Plan / ED Course  I have reviewed the triage vital signs and the nursing notes.  Pertinent labs & imaging results that were available during my care of the patient were reviewed by me and considered in my medical decision  making (see chart for details).     PMH of PAF, HTN, diet controlled DM II, pacemaker, on Multaq andno systemic anticoagulation due to falls.  He is presenting today with shortness of breath.  Patient recently saw cardiology and though his turosemide was scheduled every day, they went down to once a week.  With the idea that they could slowly increase as needed at home.  However patient became acutely short of breath over  the last 3-4 days and has not increased his fluid pill.  Today patient had mild chest pain, shortness breath.  Noted to be 80% on room air when EMS arrived.  7:46 AM I think patient's shortness breath is likely due to the fact that he completely decreased his fluid pill.  We will give him IV Lasix here, get chest x-ray,cycle troponins admit to hospital for IV diuresis.  Final Clinical Impressions(s) / ED Diagnoses   Final diagnoses:  None    ED Discharge Orders    None       Macarthur Critchley, MD 01/15/18 [phone]

## 2018-01-14 NOTE — H&P (Addendum)
[facility] Admission History and Physical Service Pager: [phone]  Patient name: [name] Medical record number: [id number] Date of birth: [birth year] Age: 84 y.o. Gender: male  Primary Care Provider: Dickie La, MD Consultants: None  Code Status: DNR    Chief Complaint: SOB   Assessment and Plan: [name] is a 84 y.o. male presenting with SOB. PMH is significant for PAF, HTN, T2DM, sinus node dysfunction, CKD III.   SOB: Patient presenting with 3-4 days of dyspnea. Seen by cardiology on 2/14 when Torsemide was discontinued due to increasing Cr above his baseline. He had been taking Torsemide 10 mg daily. Patient was hypoxic on RA at arrival to ED. He is now breathing comfortably with appropriate O2 saturations on 4L by [place]. CXR noted to have diffuse pulmonary infiltrates consistent with pulmonary edema and/or PNA. Small bilateral pleural effusions were also present. BNP remarkably elevated to 854, no prior values present. Weight 178 lbs on 2/14. Weight for this admission pending. Of note, no prior echo results or known history of CHF. Suspect this is most likely new onset CHF. Some concern for PNA given CXR findings, new mild cough, and leukocytosis to 13. However, patient is afebrile and without lactic acidosis.  -admit to telemetry, vital signs per unit  -continuous pulse ox, supplemental O2 prn  -start IV diuresis with Lasix 40 mg, will re-evaluate volume status  -obtain echo -strict I/Os and daily weights  -consider cardiology consult   -low threshold to initiate abx    Chest Pain: Likely related to dyspnea as above. iStat troponin 0.00. EKG with paced rhythm with no ST changes. Well's score of 0 so doubt PE as etiology of chest pain.  -trend troponins  -repeat EKG   Atrial fibrillation Pacemaker interrogated 1/21 that showed normal device function and no arrhythmias. No episodes of afib. CHADSVasc score of 4. Was previously on Xarelto for  anticoagulation but cardiology note states no longer a candidate for anticoagulation due to advanced age and propensity to fall.  Managed with dronedarone 400mg  tablet bid, and nebivolol 5 mg daily.  - continue dronedarone 400mg  tablet bid - Continue nebivolol 5 mg daily  Sinus node dysfunction/av block/Pacemaker Patient with prolonged QT/qtc interval on past ekgs. QTc >550 today. Avoid QT prolonging medications. . Pacing would hopefully deter some if not all arrhythmias. - continue dronederone 400 mg twice a day - continue nebivolol 5 mg daily   Hypertension Normotensive at admission,Takes nebivolol. -nebivolol 5 mg  CKD Stage III Cr 1.28 with GFR 46 at admission. Appears consistent with bl Cr 1.1-1.3.  - daily bmp - watch Cr with IV diuresis   Type II Diabetes Patient with past diagnosis of T2DM.Last A1c 5.8. Does not take any oral medication or insulin. Glucose 135 on BMET at admission.  - monitor CBG daily, add SSI if necessary    FEN/GI: SLIV, Regular diet   Prophylaxis: Lovenox    Disposition: Admit to FPTS; Attending [facility]   History of Present Illness:  [name] is a 84 y.o. male presenting with SOB. Reports has had DOE for past 3-4 days. This morning had SOB at rest prompting his ALF to have him sent to ER. Endorses bilateral lower extremity edema, orthopnea, and PND as well. New non-productive mild cough. No fevers or chills. Also with midline, non-radiating chest pain that occurred during acute SOB. Chest pain has since resolved.  Stopped Torsemide about 2 weeks ago due to lightheadedness and rising Cr. Was  prescribed this for lower extremity edema after hip fracture last fall. No known history of CHF. In the ED, was noted to be hypoxic to low 80s on RA. Patient reports that his breathing is much more comfortable with supplemental O2.   Review Of Systems: Per HPI with the following additions:   Review of Systems  Constitutional: Negative for chills and  fever.  Respiratory: Positive for cough and shortness of breath. Negative for hemoptysis, sputum production and wheezing.   Cardiovascular: Positive for chest pain, orthopnea, leg swelling and PND. Negative for palpitations.  Gastrointestinal: Negative for constipation, nausea and vomiting.  Skin: Negative for rash.  Neurological: Negative for dizziness.  All other systems reviewed and are negative.   Patient Active Problem List   Diagnosis Date Noted  . Postop check   . Elective surgery   . Normocytic anemia   . Essential hypertension   . Second degree AV block 11/29/2014  . Abnormal esophagram 09/07/2014  . Pacemaker - Medtronic Adapta 2006, gen change 2015 07/23/2013  . Chronic right hip pain 09/22/2012  . Testicular swelling, right 09/22/2012  . Presbycusis 05/14/2012  . Paroxysmal atrial fibrillation ([place]) 01/30/2012  . Hx SBO 01/30/2012  . S/P appendectomy 01/30/2012  . CHRONIC KIDNEY DISEASE STAGE III (MODERATE) 12/03/2010  . ALLERGIC RHINITIS 09/05/2009  . HOARSENESS 05/15/2009  . HIATAL HERNIA 12/14/2008  . Diverticulosis of large intestine 12/14/2008  . COLONIC POLYPS, HYPERPLASTIC, HX OF 12/14/2008  . ESOPHAGEAL STRICTURE 10/04/2008  . Other vitamin B12 deficiency anemia 01/26/2008  . GLAUCOMA 01/14/2007  . CATARACT 01/14/2007  . SICK SINUS SYNDROME 01/14/2007  . GASTROESOPHAGEAL REFLUX, NO ESOPHAGITIS 01/14/2007    Past Medical History: Past Medical History:  Diagnosis Date  . Acute gastritis without mention of hemorrhage   . Arthritis   . Atrial fibrillation ([place])   . Cancer ([place])    basal cell skin cancer removed  . Chronic kidney disease, stage III (moderate) (HCC)   . Constipation   . Diverticulosis   . Diverticulosis of colon (without mention of hemorrhage)   . Environmental allergies    HISTORY OF  . Esophageal dysmotility   . Family history of malignant neoplasm of gastrointestinal tract   . GERD (gastroesophageal reflux disease)   . Gout   .  Hiatal hernia   . History of skin cancer   . Hypertension   . Hypertrophy of prostate with urinary obstruction and other lower urinary tract symptoms (LUTS)   . Overactive bladder   . Personal history of colonic polyps 10/21/2006   hyperplastic   . Presence of permanent cardiac pacemaker 10/14/05   medtronic Adapta  . Second degree AV block 11/29/2014  . Sinus node dysfunction (HCC)   . Small bowel obstruction ([place])   . Stricture and stenosis of esophagus   . Type II or unspecified type diabetes mellitus without mention of complication, not stated as uncontrolled    pt states that he checks his blood sugar once weekly, is not on oral meds or insulin  . Unspecified cataract   . Vitamin B12 deficiency     Past Surgical History: Past Surgical History:  Procedure Laterality Date  . APPENDECTOMY    . CARDIOVERSION  10/01/2011   Procedure: CARDIOVERSION;  Surgeon: Dani Gobble [name];  Location: [facility];  Service: Cardiovascular;  Laterality: N/A;  OP CARDIOVERSION  . CARDIOVERSION  11/02/12   successful  . ESOPHAGOGASTRODUODENOSCOPY (EGD) WITH PROPOFOL N/A 09/22/2014   Procedure: ESOPHAGOGASTRODUODENOSCOPY (EGD) WITH PROPOFOL;  Surgeon: Jerene Bears,  MD;  [facility];  Service: Gastroenterology;  Laterality: N/A;  . EYE SURGERY Bilateral years ago   cataract lens replacment  . LAPAROSCOPY     for bowel obstruction  . PACEMAKER INSERTION  2006   MDT ADAPTA. s/p gen change 2015  . PERMANENT PACEMAKER GENERATOR CHANGE N/A 03/14/2014   MDT ADAPTA. Procedure: PERMANENT PACEMAKER GENERATOR CHANGE;  Surgeon: Sanda Klein, MD; Medtronic Adapta L model number [place], serial number [id number]   . PROSTATE SURGERY    . SAVORY DILATION N/A 09/22/2014   Procedure: SAVORY DILATION;  Surgeon: Jerene Bears, MD;  [facility];  Service: Gastroenterology;  Laterality: N/A;  . TONSILLECTOMY    . TOTAL HIP ARTHROPLASTY Left   . TOTAL HIP ARTHROPLASTY Right 08/17/2017   Procedure: RIGHT  TOTAL HIP ARTHROPLASTY ANTERIOR APPROACH;  Surgeon: Rod Can, MD;  Location: [facility];  Service: Orthopedics;  Laterality: Right;  Needs RNFA    Social History: Social History   Tobacco Use  . Smoking status: Former Smoker    Packs/day: 1.00    Years: 20.00    Pack years: 20.00    Types: Cigarettes    Last attempt to quit: 11/17/1969    Years since quitting: 48.1  . Smokeless tobacco: Never Used  Substance Use Topics  . Alcohol use: No    Alcohol/week: 0.0 oz    Comment: none since 1990  . Drug use: No   Additional social history:  Not current smoker.  Please also refer to relevant sections of EMR.  Family History: Family History  Problem Relation Age of Onset  . Parkinsonism Mother   . Hypertension Father   . Colon cancer Brother 67  . Hypertension Brother   . Hodgkin's lymphoma Grandchild     Allergies and Medications: Allergies  Allergen Reactions  . Codeine Nausea Only   No current facility-administered medications on file prior to encounter.    Current Outpatient Medications on File Prior to Encounter  Medication Sig Dispense Refill  . dronedarone (MULTAQ) 400 MG tablet Take 1 tablet (400 mg total) by mouth 2 (two) times daily with a meal. 180 tablet 3  . magnesium hydroxide (MILK OF MAGNESIA) 400 MG/5ML suspension Take 15 mLs by mouth daily as needed for mild constipation.    . Multiple Vitamins-Minerals (MULTIVITAMIN WITH MINERALS) tablet Take 1 tablet by mouth daily.    . nebivolol (BYSTOLIC) 5 MG tablet Take 1 tablet (5 mg total) by mouth daily. 90 tablet 3  . pantoprazole (PROTONIX) 40 MG tablet Take 1 tablet (40 mg total) by mouth daily before breakfast. 90 tablet 3  . Polyethyl Glycol-Propyl Glycol (SYSTANE FREE OP) Place 1 drop into both eyes 2 (two) times daily as needed (dry eyes). For dry eyes     . polyethylene glycol (MIRALAX / GLYCOLAX) packet Take 17 g by mouth daily. (Patient taking differently: Take 17 g by mouth 2 (two) times daily. Mix with 8  oz juice and drink) 14 each 0  . Probiotic Product (PROBIOTIC PO) Take 1 tablet by mouth daily.    . psyllium (HYDROCIL/METAMUCIL) 95 % PACK Take 1 packet by mouth 2 (two) times daily. 56 each 2  . sennosides-docusate sodium (SENOKOT-S) 8.6-50 MG tablet Take 1 tablet by mouth daily.    . silodosin (RAPAFLO) 4 MG CAPS capsule Take 2 capsules (8 mg total) by mouth daily with breakfast. (Patient taking differently: Take 8 mg by mouth daily with supper. ) 135 capsule 3  . vitamin B-12 (CYANOCOBALAMIN)  500 MCG tablet Take 500 mcg by mouth daily.      Objective: BP (!) 136/57   Pulse 70   Resp (!) 23   SpO2 98%  Exam: General: pleasant, elderly male lying in bed in NAD  Eyes: EOMI, no scleral icterus  ENTM: Oropharynx clear. MMM.  Neck: Supple. Full ROM.  Cardiovascular: RRR. No murmurs appreciated.  Respiratory: Diffuse crackles in bases. Coarse breath sounds throughout all lung fields. Comfortable WOB without accessory muscle use.  Gastrointestinal: soft, NTND  MSK: Moves all extremities spontaneously.  Derm: Warm and dry.  Neuro: Alert and oriented to person, place, and time. No focal deficits.  Psych: Appropriate mood and affect.   Labs and Imaging: CBC BMET  Recent Labs  Lab 01/14/18 0729  WBC 12.9*  HGB 8.7*  HCT 26.1*  PLT 318   Recent Labs  Lab 01/14/18 0729  NA 132*  K 4.4  CL 99*  CO2 21*  BUN 25*  CREATININE 1.28*  GLUCOSE 135*  CALCIUM 8.3*     Dg Chest 2 View  Result Date: 01/14/2018 CLINICAL DATA:  Shortness of breath. EXAM: CHEST  2 VIEW COMPARISON:  Chest x-ray 03/14/2017. FINDINGS: Cardiac pacer with lead tip over the right atrium right ventricle. Cardiomegaly. Diffuse bilateral pulmonary infiltrates are noted consistent with pulmonary edema and/or pneumonia. Pulmonary infiltrates are primarily in the upper lobes. Small bilateral pleural effusions. No acute bony abnormality. IMPRESSION: Cardiac pacer noted stable position. Cardiomegaly. Diffuse bilateral  pulmonary infiltrates, primarily in the upper lobes. Findings are consistent pulmonary edema and/or pneumonia. Small bilateral pleural effusions. Electronically Signed   By: Marcello Moores  Register   On: 01/14/2018 07:51    Nicolette Bang, DO 01/14/2018, 10:58 AM PGY-3, [place] Intern pager: [phone], text pages welcome

## 2018-01-14 NOTE — ED Triage Notes (Signed)
Patient brought in by [facility] from [facility] for shortness of breath. Patient states shortness of breath has been getting worse for the last few days. Shortness of breath exacerbated by movement. Room air sat 80%, placed on 4L O2 , does not wear O2 at baseline. Given 324mg  aspirin at facility, received 1SL nitro PTA from EMS for chest tightness which decreased from 4/10 to 1/10. Patient has 20g saline lock in left forearm, alert and oriented.

## 2018-01-14 NOTE — Progress Notes (Signed)
CRITICAL VALUE STICKER  CRITICAL VALUE: trop 0.03  RECEIVER: sam Nayel Purdy  DATE & TIME NOTIFIED: 01/14/18 @ 4:18  MD NOTIFIED: family medicine via text page

## 2018-01-14 NOTE — Discharge Summary (Addendum)
[facility] Discharge Summary  **DNR**  Patient name: [name] Medical record number: [id number] Date of birth: 21-Jun-1920 Age: 84 y.o. Gender: male Date of Admission: 01/14/2018  Date of Discharge: 01/25/18 Admitting Physician: Blane Ohara McDiarmid, MD  Primary Care Provider: Dickie La, MD Consultants: SLP, Cardiology  Indication for Hospitalization: SOB  Discharge Diagnoses/Problem List:  Acute on chronic respiratory failure 2/2 CHF exacerbation + Aspiration PNA  Chest pain Atrial fibrillation  Sinus node dysfunction/AV block/Pacemaker  HTN CKD Stage III Type 2 diabetes  Sacral ulcer, stage 1 Constipation   Disposition: SNF  Discharge Condition: stable for transfer to [facility]  Discharge Exam: General: awake and alert, laying in bed, NRB on  Cardiovascular: RRR, no MRG Respiratory: CTAB, no wheezes, rales or rhonchi  Abdomen: soft, non tender, non distended, bowel sounds normal  Extremities: no edema, non tender    Brief Hospital Course:  [name] is a 84 y.o. male presenting with worsening SOB. Patient was reported to have DOE for past 3-4 days prior to admission and SOB at rest on day of admission. Patient sent to ED from ALF. Thought to be likely 2/2 new onset CHF given recent discontinuation of torsemide by cardiology on 2/14 as well as CXR findings of pulmonary edema. BNP on admission of 854. CXR also showed possible pneumonia and given age and aspiration risk, empiric therapy with augmentin (2/28 - 3/2) was given. Antibiotics were transitioned to IV vanc/cefepime (3/2 - 3/4) due to no improvement on augmentin. Antibiotics were then transitioned to unasyn (3/4-3/7) and then meropenam (3/7-3/8). Antibiotics were discontinued as patient was believed to have pneumonitis rather than bacterial pneumonia. Echo performed inpatient showing LVEF 50-55%, G2DD, hypokinesis of inferior and inferoseptal myocardium. Patient was diureses during  admission with IV lasix and had net negative output of 3.6L prior to discharge. Patient did not improve and needed morphine prn and solumedrol q6h. During admission patient moved toward comfort measures per family's request.   During admission patient had chest pain. Troponin was trended at 0.03>0.03>0.03. EKG showing prolonged QTc.   Patient during admission with dysphagia. GI consulted, given patient's h/o smooth esophageal narrowing. And recommended barium esophagram to exclude mass lesion or tight stricture. Esophogram was limited due to orthopnea and prominent hypoxia when recumbent. SLP recommending dysphagia III diet   Patient found to have sacral ulcer (stage 1). Patient recommended for foam dressing and to have mattress that would decrease pressure. Sacral ulcer was stable during admission.   Issues for Follow Up:  1. PATIENT IS DNR 2. Monitor dysphagia, recommended for dysphagia II diet   3. Monitor constipation  4. Monitor respiratory status, patient is DNR but should receive supplemental O2 5. Given goals of discontinuing aggressive treatment per Dr. Verlon Au discussions with daughter in law, have discontinue the following noncomfort medicines: dronedarone. Can discontinue PPI if patient desires 6. Morphine is for air hunger as well as pain 7. Patient's needs were 10L O2 via non-rebreather at time of d/c 8. Monitor sacral ulcer  Significant Procedures:  Esophogram 01/19/18  Significant Labs and Imaging:  Recent Labs  Lab 01/21/18 0650 01/22/18 0420  WBC 14.2* 16.7*  HGB 9.3* 10.0*  HCT 29.7* 31.7*  PLT 323 303   Recent Labs  Lab 01/21/18 0650 01/22/18 0420  NA 140 141  K 3.1* 4.1  CL 99* 99*  CO2 27 30  GLUCOSE 263* 206*  BUN 44* 49*  CREATININE 1.48* 1.28*  CALCIUM 7.9* 7.8*  MG 2.5*  --  Ref. Range 01/14/2018 07:32  B Natriuretic Peptide Latest Ref Range: 0.0 - 100.0 pg/mL 854.6 (H)    Ref. Range 01/14/2018 07:45  Lactic Acid, Venous Latest Ref Range: 0.5 -  1.9 mmol/L 1.58    Ref. Range 01/14/2018 14:58 01/14/2018 21:02 01/15/2018 02:32  Troponin I Latest Ref Range: <0.03 ng/mL 0.03 (HH) 0.03 (HH) 0.03 (HH)    Ref. Range 01/20/2018 06:42  Strep Pneumo Urinary Antigen Latest Ref Range: NEGATIVE  NEGATIVE  L. pneumophila Serogp 1 Ur Ag Latest Ref Range: Negative  Negative    Ref. Range 01/21/2018 06:50 01/22/2018 04:20  Procalcitonin Latest Units: ng/mL 0.10 <0.10     Dg Chest 2 View  Result Date: 01/18/2018 CLINICAL DATA:  Shortness of breath. History of atrial fibrillation, cancer, heart failure, hypertension, pacemaker, diabetes. Former smoker. EXAM: CHEST  2 VIEW COMPARISON:  Chest x-rays dated 01/16/2018 in 01/14/2018. FINDINGS: Stable cardiomegaly. Left chest wall pacemaker leads appear stable in position. Interstitial and alveolar pulmonary opacities again noted bilaterally, right greater than left, stable compared to the most recent chest x-ray, increased compared to the earlier chest x-ray of 01/14/2018. Small bilateral pleural effusions. No new lung findings. No pneumothorax seen. IMPRESSION: 1. Patchy bilateral interstitial and alveolar opacities, right greater than left, stable compared to most recent chest x-ray of 01/16/2018, most compatible with CHF/pulmonary edema. 2. Small bilateral pleural effusions. 3. Stable cardiomegaly. Electronically Signed   By: Franki Cabot M.D.   On: 01/18/2018 09:08   Dg Chest 2 View  Result Date: 01/16/2018 CLINICAL DATA:  Shortness of breath EXAM: CHEST  2 VIEW COMPARISON:  January 14, 2018 FINDINGS: Worsening bilateral infiltrates, right greater than left, most prominent in the upper lungs. No other changes. IMPRESSION: Worsening bilateral upper lobe predominant infiltrates. Recommend follow-up to resolution. Electronically Signed   By: Dorise Bullion III M.D   On: 01/16/2018 11:24   Dg Chest 2 View  Result Date: 01/14/2018 CLINICAL DATA:  Shortness of breath. EXAM: CHEST  2 VIEW COMPARISON:  Chest x-ray  03/14/2017. FINDINGS: Cardiac pacer with lead tip over the right atrium right ventricle. Cardiomegaly. Diffuse bilateral pulmonary infiltrates are noted consistent with pulmonary edema and/or pneumonia. Pulmonary infiltrates are primarily in the upper lobes. Small bilateral pleural effusions. No acute bony abnormality. IMPRESSION: Cardiac pacer noted stable position. Cardiomegaly. Diffuse bilateral pulmonary infiltrates, primarily in the upper lobes. Findings are consistent pulmonary edema and/or pneumonia. Small bilateral pleural effusions. Electronically Signed   By: Marcello Moores  Register   On: 01/14/2018 07:51   Dg Esophagus W/water Raelene Bott Cm  Result Date: 01/19/2018 CLINICAL DATA:  Concern for aspiration. History of GERD. History of esophageal strictures with multiple dilatations. EXAM: ESOPHOGRAM/BARIUM SWALLOW TECHNIQUE: Single contrast examination was performed using isotopic water-soluble contrast. FLUOROSCOPY TIME:  Fluoroscopy Time:  24 seconds Radiation Exposure Index (if provided by the fluoroscopic device): 6.8 mGy Number of Acquired Spot Images: 0 COMPARISON:  None. FINDINGS: Extensive airspace disease by chest radiography yesterday. The patient would become notably hypoxic when recumbent (70s O2). Patient could not stand safely for this study and had to take swallows sitting upright and in the positioned on his back for imaging. This was a markedly suboptimal exam. There is relative smooth narrowing at the lower esophagus that did not cause stasis of thin liquid. It is unclear if this is due to a stricture or under distention by small swallows. Motility was unremarkable for age. No aspiration was seen. IMPRESSION: 1. Very limited study due to orthopnea with prominent hypoxia when recumbent.  2. Only the most severe stricture would be detected on this study. There was no stasis/delayed passage of thin liquid. Electronically Signed   By: Monte Fantasia M.D.   On: 01/19/2018 14:11   Echo:  Study  Conclusions - Left ventricle: The cavity size was normal. There was mild   concentric hypertrophy. Systolic function was normal. The   estimated ejection fraction was in the range of 50% to 55%.   Hypokinesis of the inferior and inferoseptal myocardium. Features   are consistent with a pseudonormal left ventricular filling   pattern, with concomitant abnormal relaxation and increased   filling pressure (grade 2 diastolic dysfunction). Doppler   parameters are consistent with high ventricular filling pressure. - Aortic valve: Transvalvular velocity was within the normal range.   There was no stenosis. There was no regurgitation. - Mitral valve: Transvalvular velocity was within the normal range.   There was no evidence for stenosis. There was trivial   regurgitation. - Left atrium: The atrium was mildly dilated. - Right ventricle: The cavity size was normal. Wall thickness was   normal. Systolic function was normal. - Right atrium: The atrium was severely dilated. - Tricuspid valve: There was moderate regurgitation. - Pulmonary arteries: Systolic pressure was moderately increased.   PA peak pressure: 55 mm Hg (S). - Pericardium, extracardiac: A trivial pericardial effusion was   identified. There was a left pleural effusion. Prepared and Electronically Authenticated by Skeet Latch, MD 2019-03-01T15:31:44  Results/Tests Pending at Time of Discharge: n/a  Discharge Medications:  Allergies as of 01/27/2018      Reactions   Codeine Nausea Only      Medication List    STOP taking these medications   dronedarone 400 MG tablet Commonly known as:  MULTAQ   hydrocortisone 25 MG suppository Commonly known as:  ANUSOL-HC   magnesium hydroxide 400 MG/5ML suspension Commonly known as:  MILK OF MAGNESIA   multivitamin with minerals tablet   OCUVITE-LUTEIN PO   psyllium 95 % Pack Commonly known as:  HYDROCIL/METAMUCIL   SYSTANE FREE OP   vitamin B-12 500 MCG  tablet Commonly known as:  CYANOCOBALAMIN     TAKE these medications   bisacodyl 10 MG suppository Commonly known as:  DULCOLAX Place 1 suppository (10 mg total) rectally 2 (two) times daily.   morphine 2 MG/ML injection Inject 0.25 mLs (0.5 mg total) into the vein every 2 (two) hours as needed (increased respiratory rate, pain, respiratory hunger, distress, discomfort).   nebivolol 5 MG tablet Commonly known as:  BYSTOLIC Take 1 tablet (5 mg total) by mouth daily.   pantoprazole 40 MG tablet Commonly known as:  PROTONIX Take 1 tablet (40 mg total) by mouth daily before breakfast.   polyethylene glycol packet Commonly known as:  MIRALAX / GLYCOLAX Take 17 g by mouth daily. What changed:  additional instructions   senna-docusate 8.6-50 MG tablet Commonly known as:  Senokot-S Take 1 tablet by mouth 2 (two) times daily.   silodosin 4 MG Caps capsule Commonly known as:  RAPAFLO Take 2 capsules (8 mg total) by mouth daily with breakfast.            Durable Medical Equipment  (From admission, onward)        Start     Ordered   01/25/18 1557  For home use only DME oxygen  Once    Comments:  15L non-rebreather for use at SNF  Question Answer Comment  Mode or (Route) Mask   Liters per  Minute 15   Oxygen delivery system Gas      01/25/18 1557      Discharge Instructions: Please refer to Patient Instructions section of EMR for full details.  Patient was counseled important signs and symptoms that should prompt return to medical care, changes in medications, dietary instructions, activity restrictions, and follow up appointments.   Follow-Up Appointments:  Contact information for follow-up providers    [facility] Follow up.   Specialty:  Gastroenterology Why:  the appoinment you had for 3/8 with the PA Zehr at the GI office was cancelled, do not need to keep the appointment, since GI saw you here in the [place] information: [address]  [place] [phone]           Contact information for after-discharge care    Destination    [facility] .   Service:  Skilled Nursing Contact information: [place] [phone]                  Caroline More, DO 01/27/2018, 1:49 PM PGY-1, [place]

## 2018-01-14 NOTE — Evaluation (Signed)
Clinical/Bedside Swallow Evaluation Patient Details  Name: CHRISANGEL ESKENAZI MRN: [id number] Date of Birth: [birth year]  Today's Date: 01/14/2018 Time: SLP Start Time (ACUTE ONLY): 43 SLP Stop Time (ACUTE ONLY): 1640 SLP Time Calculation (min) (ACUTE ONLY): 14 min  Past Medical History:  Past Medical History:  Diagnosis Date  . Acute gastritis without mention of hemorrhage   . Arthritis   . Atrial fibrillation ([place])   . Cancer ([place])    basal cell skin cancer removed  . Chronic kidney disease, stage III (moderate) (HCC)   . Constipation   . Diverticulosis   . Diverticulosis of colon (without mention of hemorrhage)   . Environmental allergies    HISTORY OF  . Esophageal dysmotility   . Family history of malignant neoplasm of gastrointestinal tract   . GERD (gastroesophageal reflux disease)   . Gout   . Hiatal hernia   . History of skin cancer   . Hypertension   . Hypertrophy of prostate with urinary obstruction and other lower urinary tract symptoms (LUTS)   . Overactive bladder   . Personal history of colonic polyps 10/21/2006   hyperplastic   . Presence of permanent cardiac pacemaker 10/14/05   medtronic Adapta  . Second degree AV block 11/29/2014  . Sinus node dysfunction (HCC)   . Small bowel obstruction ([place])   . Stricture and stenosis of esophagus   . Type II or unspecified type diabetes mellitus without mention of complication, not stated as uncontrolled    pt states that he checks his blood sugar once weekly, is not on oral meds or insulin  . Unspecified cataract   . Vitamin B12 deficiency    Past Surgical History:  Past Surgical History:  Procedure Laterality Date  . APPENDECTOMY    . CARDIOVERSION  10/01/2011   Procedure: CARDIOVERSION;  Surgeon: Dani Gobble [name];  Location: [facility] OR;  Service: Cardiovascular;  Laterality: N/A;  OP CARDIOVERSION  . CARDIOVERSION  11/02/12   successful  . ESOPHAGOGASTRODUODENOSCOPY (EGD) WITH PROPOFOL N/A 09/22/2014   Procedure:  ESOPHAGOGASTRODUODENOSCOPY (EGD) WITH PROPOFOL;  Surgeon: Jerene Bears, MD;  Location: [facility];  Service: Gastroenterology;  Laterality: N/A;  . EYE SURGERY Bilateral years ago   cataract lens replacment  . LAPAROSCOPY     for bowel obstruction  . PACEMAKER INSERTION  2006   MDT ADAPTA. s/p gen change 2015  . PERMANENT PACEMAKER GENERATOR CHANGE N/A 03/14/2014   MDT ADAPTA. Procedure: PERMANENT PACEMAKER GENERATOR CHANGE;  Surgeon: Sanda Klein, MD; Medtronic Adapta L model number [place], serial number [id number]   . PROSTATE SURGERY    . SAVORY DILATION N/A 09/22/2014   Procedure: SAVORY DILATION;  Surgeon: Jerene Bears, MD;  Location: [facility];  Service: Gastroenterology;  Laterality: N/A;  . TONSILLECTOMY    . TOTAL HIP ARTHROPLASTY Left   . TOTAL HIP ARTHROPLASTY Right 08/17/2017   Procedure: RIGHT TOTAL HIP ARTHROPLASTY ANTERIOR APPROACH;  Surgeon: Rod Can, MD;  Location: [facility];  Service: Orthopedics;  Laterality: Right;  Needs RNFA   HPI:  Pt is a 84 y.o.malepresenting with SOB. CXR shows diffuse bilateral infiltrates, primarily in the upper lobes, consistent with pulmonary edema and/or PNA.  PMH is significant forPAF, HTN, T2DM, sinus node dysfunction, CKD III. He has a h/o esophageal dysphagia, including GERD, dysmotility, and strictures requiring multiple dilations. Pt did have MBS completed in October 2015 that showed normal oropharyngeal function for his age.   Assessment / Plan / Recommendation Clinical Impression  Pt has immediate and delayed  coughing that is only noted with straw sips of thin liquid. Outwardly he appears to have some discoordination as he tries to take consecutive sips, and he starts to have mild eructation as well. With SLP intervention for straw removal and Min cues for single sips, no further signs of aspiration are observed with thin liquids. He has no overt difficulty with solids, but subjectively he describes the sensation of food/pills  getting "stuck" (pointing toward his stomach), indigestion, and occasional regurgitation. He says that he was scheduled to see GI next week for these symptoms. Given the above, suspect primary esophageal issues, although there is the potential for secondary pharyngeal complications. Recommend Dys 3 diet and thin liquids by cup and meds crushed in puree with aspiration and esophageal precautions. MD may wish to consider esophageal and/or GI w/u acutely. SLP Visit Diagnosis: Dysphagia, unspecified (R13.10)    Aspiration Risk  Mild aspiration risk    Diet Recommendation Dysphagia 3 (Mech soft);Thin liquid   Liquid Administration via: Cup;No straw Medication Administration: Crushed with puree Supervision: Patient able to self feed;Intermittent supervision to cue for compensatory strategies Compensations: Slow rate;Small sips/bites;Follow solids with liquid Postural Changes: Seated upright at 90 degrees;Remain upright for at least 30 minutes after po intake    Other  Recommendations Recommended Consults: Consider GI evaluation;Consider esophageal assessment Oral Care Recommendations: Oral care BID   Follow up Recommendations None      Frequency and Duration min 2x/week  1 week       Prognosis Prognosis for Safe Diet Advancement: Fair Barriers to Reach Goals: Other (Comment)(suspect primary esophageal issues)      Swallow Study   General HPI: Pt is a 84 y.o.malepresenting with SOB. CXR shows diffuse bilateral infiltrates, primarily in the upper lobes, consistent with pulmonary edema and/or PNA.  PMH is significant forPAF, HTN, T2DM, sinus node dysfunction, CKD III. He has a h/o esophageal dysphagia, including GERD, dysmotility, and strictures requiring multiple dilations. Pt did have MBS completed in October 2015 that showed normal oropharyngeal function for his age. Type of Study: Bedside Swallow Evaluation Previous Swallow Assessment: see HPI Diet Prior to this Study: Regular;Thin  liquids Temperature Spikes Noted: No Respiratory Status: Nasal cannula History of Recent Intubation: No Behavior/Cognition: Alert;Cooperative;Pleasant mood Oral Cavity Assessment: Dried secretions(pt cleared with Mod I given small amount of water to moisten) Oral Care Completed by SLP: No Oral Cavity - Dentition: Dentures, top;Dentures, bottom Vision: Functional for self-feeding Self-Feeding Abilities: Able to feed self Patient Positioning: Upright in bed Baseline Vocal Quality: Normal Volitional Cough: Strong Volitional Swallow: Able to elicit    Oral/Motor/Sensory Function Overall Oral Motor/Sensory Function: Within functional limits   Ice Chips Ice chips: Within functional limits Presentation: Spoon   Thin Liquid Thin Liquid: Impaired Presentation: Cup;Self Fed;Straw Pharyngeal  Phase Impairments: Cough - Immediate;Cough - Delayed(with straw only)    Nectar Thick Nectar Thick Liquid: Not tested   Honey Thick Honey Thick Liquid: Not tested   Puree Puree: Within functional limits Presentation: Self Fed;Spoon   Solid   GO   Solid: Within functional limits Presentation: Self [place] 01/14/2018,5:17 PM  Germain Osgood, M.A. CCC-SLP [phone]

## 2018-01-15 ENCOUNTER — Encounter (HOSPITAL_COMMUNITY): Payer: Self-pay

## 2018-01-15 ENCOUNTER — Inpatient Hospital Stay (HOSPITAL_COMMUNITY): Payer: Medicare Other

## 2018-01-15 DIAGNOSIS — D649 Anemia, unspecified: Secondary | ICD-10-CM

## 2018-01-15 DIAGNOSIS — I361 Nonrheumatic tricuspid (valve) insufficiency: Secondary | ICD-10-CM

## 2018-01-15 DIAGNOSIS — R1314 Dysphagia, pharyngoesophageal phase: Secondary | ICD-10-CM

## 2018-01-15 LAB — BASIC METABOLIC PANEL
Anion gap: 13 (ref 5–15)
BUN: 29 mg/dL — AB (ref 6–20)
CO2: 24 mmol/L (ref 22–32)
CREATININE: 1.36 mg/dL — AB (ref 0.61–1.24)
Calcium: 8 mg/dL — ABNORMAL LOW (ref 8.9–10.3)
Chloride: 98 mmol/L — ABNORMAL LOW (ref 101–111)
GFR, EST AFRICAN AMERICAN: 49 mL/min — AB (ref >60)
GFR, EST NON AFRICAN AMERICAN: 43 mL/min — AB (ref >60)
Glucose, Bld: 134 mg/dL — ABNORMAL HIGH (ref 65–99)
Potassium: 3.5 mmol/L (ref 3.5–5.1)
SODIUM: 135 mmol/L (ref 135–145)

## 2018-01-15 LAB — CBC
HCT: 26.2 % — ABNORMAL LOW (ref 39.0–52.0)
Hemoglobin: 8.8 g/dL — ABNORMAL LOW (ref 13.0–17.0)
MCH: 29 pg (ref 26.0–34.0)
MCHC: 33.6 g/dL (ref 30.0–36.0)
MCV: 86.5 fL (ref 78.0–100.0)
PLATELETS: 349 10*3/uL (ref 150–400)
RBC: 3.03 MIL/uL — AB (ref 4.22–5.81)
RDW: 14.7 % (ref 11.5–15.5)
WBC: 12 10*3/uL — AB (ref 4.0–10.5)

## 2018-01-15 LAB — TROPONIN I: Troponin I: 0.03 ng/mL (ref <0.03)

## 2018-01-15 LAB — ECHOCARDIOGRAM COMPLETE
Height: 66 in
WEIGHTICAEL: 2807.78 [oz_av]

## 2018-01-15 LAB — TSH: TSH: 1.946 u[IU]/mL (ref 0.350–4.500)

## 2018-01-15 LAB — MRSA PCR SCREENING: MRSA BY PCR: NEGATIVE

## 2018-01-15 MED ORDER — PANTOPRAZOLE SODIUM 40 MG PO TBEC
40.0000 mg | DELAYED_RELEASE_TABLET | Freq: Every day | ORAL | Status: DC
  Administered 2018-01-16 – 2018-01-27 (×11): 40 mg via ORAL
  Filled 2018-01-15 (×9): qty 1

## 2018-01-15 MED ORDER — FUROSEMIDE 10 MG/ML IJ SOLN
40.0000 mg | Freq: Once | INTRAMUSCULAR | Status: AC
  Administered 2018-01-15: 40 mg via INTRAVENOUS
  Filled 2018-01-15: qty 4

## 2018-01-15 MED ORDER — PERFLUTREN LIPID MICROSPHERE
1.0000 mL | INTRAVENOUS | Status: AC | PRN
  Administered 2018-01-15: 2 mL via INTRAVENOUS
  Filled 2018-01-15: qty 10

## 2018-01-15 MED ORDER — POTASSIUM CHLORIDE CRYS ER 20 MEQ PO TBCR
40.0000 meq | EXTENDED_RELEASE_TABLET | Freq: Once | ORAL | Status: AC
  Administered 2018-01-15: 40 meq via ORAL
  Filled 2018-01-15: qty 2

## 2018-01-15 NOTE — Progress Notes (Signed)
Asked to comment about the use of dronedarone in Mr. Derrick, who presented with mild CHF. LVEF on echo is 50-55%. This medication is relatively contraindicated in patients with LVEF <35% and acute decompensated CHF - I would favor continuing it at this point. I agree with your management strategy and we will not place a formal consultation at this time. Dr. Loletha Grayer would be happy to see him back in follow-up after he recovers. Thanks for notifying us of his admission.  Pixie Casino, MD, [facility], [place] Director of the [facility] Lipid Disorders &  Cardiovascular Risk Reduction Clinic Diplomate of the American Board of Clinical Lipidology Attending Cardiologist  Direct Dial: [phone]  Fax: [phone]  Website:  [email or web address]

## 2018-01-15 NOTE — Care Management Note (Signed)
Case Management Note  Patient Details  Name: [name] MRN: [id number] Date of Birth: Oct 24, 1920  Subjective/Objective:      Admitted with hypoxemia/ SOB.PMH of PAF, HTN, diet controlled DM II, sinus node dysfunction, high grade 2nd degree AV block, s//p MDT Adapta dual lead PPM (2006, generator change 2015). From [facility]/ [facility].               PCP: SARA Neal  Action/Plan: Transition back to SNF when medically stable. Referral made to CSW. CM will continue to follow for disposition needs.  Expected Discharge Date:                  Expected Discharge Plan:  Skilled Nursing Facility([facility])  In-House Referral:  Clinical Social Work  Discharge planning Services  CM Consult  Post Acute Care Choice:    Choice offered to:     DME Arranged:    DME Agency:     HH Arranged:    HH Agency:     Status of Service:  In process, will continue to follow  If discussed at Long Length of Stay Meetings, dates discussed:    Additional Comments:  Sharin Mons, RN 01/15/2018, 8:43 AM

## 2018-01-15 NOTE — Progress Notes (Signed)
  Speech Language Pathology Treatment: Dysphagia  Patient Details Name: [name] MRN: [id number] DOB: 1919/11/24 Today's Date: 01/15/2018 Time: 6712-4580 SLP Time Calculation (min) (ACUTE ONLY): 9 min  Assessment / Plan / Recommendation Clinical Impression  Pt consumed pureed solids and thin liquids by cup from his breakfast tray, preferring not to eat the more solid foods. He attributed this to lack of appetite, not because the food was too difficult to masticate or swallow. No overt s/s of aspiration were observed and he used swallowing strategies with Mod I. SLP reinforced education about recommendations and impressions - continue to suspect a primary esophageal source to his symptoms. SLP will f/u briefly to assess for tolerance but with no f/u post-discharge anticipated.   HPI HPI: Pt is a 84 y.o.malepresenting with SOB. CXR shows diffuse bilateral infiltrates, primarily in the upper lobes, consistent with pulmonary edema and/or PNA.  PMH is significant forPAF, HTN, T2DM, sinus node dysfunction, CKD III. He has a h/o esophageal dysphagia, including GERD, dysmotility, and strictures requiring multiple dilations. Pt did have MBS completed in October 2015 that showed normal oropharyngeal function for his age.      SLP Plan  Continue with current plan of care       Recommendations  Diet recommendations: Dysphagia 3 (mechanical soft);Thin liquid Liquids provided via: Cup;No straw Medication Administration: Crushed with puree Supervision: Patient able to self feed;Intermittent supervision to cue for compensatory strategies Compensations: Slow rate;Small sips/bites;Follow solids with liquid Postural Changes and/or Swallow Maneuvers: Seated upright 90 degrees;Upright 30-60 min after meal                Oral Care Recommendations: Oral care BID Follow up Recommendations: None SLP Visit Diagnosis: Dysphagia, unspecified (R13.10) Plan: Continue with current plan of care        GO                Germain Osgood 01/15/2018, 11:10 AM  Germain Osgood, M.A. CCC-SLP [phone]

## 2018-01-15 NOTE — Progress Notes (Signed)
Attempted to wean oxygen, patient desaturates to 83% on room air, 2 liters applied via nasal canula.

## 2018-01-15 NOTE — Progress Notes (Signed)
  Echocardiogram 2D Echocardiogram has been performed.  [name] 01/15/2018, 11:32 AM

## 2018-01-15 NOTE — Consult Note (Addendum)
[facility] Consult: 2:19 PM 01/15/2018  LOS: 1 day    Referring Provider: Dr McDiarmid  Primary Care Physician:  Dickie La, MD Primary Gastroenterologist:  Dr. Sharlett Iles.     Reason for Consultation:  dysphagia   HPI: [name] is a 84 y.o. male.  Hx SSS, pacemaker in situ. A fib no longer on Zarelto.   CKD stage 3.  Anemia, Hgb 6.5 - 8.5 in 08/2017 at time of elective anterior, right hip THR for endstage O/A .  Left clavicle fx after fall 09/2017. Diet controlled DM2.      GB stones, sludge.  Hx SBO, reolved medically in 2017.  GERD and long hx dysphagia.  2004, 2006, 2009, 2010, 2012 EGDs with at most [facility], never a stricture. ? cricopharyngeal muscle dysfunction vs occult stricture vs dysmotility.   04/2011 esophageal manometry study normal.   08/2014 Esophagram.  IMPRESSION: 1. Smooth distal esophageal narrowing. Favor benign stricture over mass given the lack of lobularity. Endoscopy is likely warranted to assess the distal esophagus further and potentially for balloon dilatation if benign appearance. 2. The patient had clear in GL penetration of thick liquid contrast to the level of the cords. This is a worse degree of penetration than what was observed during the modified barium swallow. Query swallowing fatigue. 3. Prominent cervical spondylosis. 4. Nonspecific esophageal dysmotility disorder 09/2014 EGD.  Dr Hilarie Fredrickson.  For dysphagia.  Normal study.  Empiric balloon dilation up to 77mm.   Colonoscopies in 1998: normal study.  2007: 5 mm sigmoid polyp, descending-sigmoid tics.    Admitted yesterday with bil PNA, aspiration suspected by attending MD.  Also pulm edema, elevated BNP.    12/31/17 cardiology PA stopped Torsemide due to rising creatinine.  This helped his c/o dizziness.  Prior to that,  Altace had been discontinued for rising creatinine, hypotension, weakness.      Has had solid >> liquid dysphagia for at least 1 year.  Has not progressed.  He says his mouth is dry and food sticks to the roof of his mouth, which is actually the lingual surface of his upper dentures.  He has difficulty chewing food due to missing lower molars.  Points to back of throat, not to [place] region as where he feels the dysphagia.  Will cough and gag sometimes, especially when food piles up in the back of his mouth.  In addition to dysphagia, has lost his appetite and does not get very hungry.   SLP bedside swallow eval: Immediate and delayed cough with straw sips.  Discoordination with consecutive sips.  No trouble with solids.  Pt pointing to stomach saying he gets sensation of food and pills sticking there (different than what he told me).  ? Primary esophageal issues?.  Mild aspiration risk, approved jfor D 3 (mechanical soft) diet and thin liquids.    Hgb of 8.8.  Was 8.5 in 08/2017, 11.4 on 12/31/17.  MCV normal.  Has not had any bloody stools, cough, or regurgitant.  No emesis.   Past Medical History:  Diagnosis Date  .  Acute gastritis without mention of hemorrhage   . Arthritis   . Atrial fibrillation ([place])   . Cancer ([place])    basal cell skin cancer removed  . Chronic kidney disease, stage III (moderate) (HCC)   . Constipation   . Diverticulosis   . Diverticulosis of colon (without mention of hemorrhage)   . Environmental allergies    HISTORY OF  . Esophageal dysmotility   . Family history of malignant neoplasm of gastrointestinal tract   . GERD (gastroesophageal reflux disease)   . Gout   . Hiatal hernia   . History of skin cancer   . Hypertension   . Hypertrophy of prostate with urinary obstruction and other lower urinary tract symptoms (LUTS)   . Overactive bladder   . Personal history of colonic polyps 10/21/2006   hyperplastic   . Presence of permanent cardiac pacemaker 10/14/05    medtronic Adapta  . Second degree AV block 11/29/2014  . Sinus node dysfunction (HCC)   . Small bowel obstruction ([place])   . Stricture and stenosis of esophagus   . Type II or unspecified type diabetes mellitus without mention of complication, not stated as uncontrolled    pt states that he checks his blood sugar once weekly, is not on oral meds or insulin  . Unspecified cataract   . Vitamin B12 deficiency     Past Surgical History:  Procedure Laterality Date  . APPENDECTOMY    . CARDIOVERSION  10/01/2011   Procedure: CARDIOVERSION;  Surgeon: Dani Gobble [name];  Location: [facility] OR;  Service: Cardiovascular;  Laterality: N/A;  OP CARDIOVERSION  . CARDIOVERSION  11/02/12   successful  . ESOPHAGOGASTRODUODENOSCOPY (EGD) WITH PROPOFOL N/A 09/22/2014   Procedure: ESOPHAGOGASTRODUODENOSCOPY (EGD) WITH PROPOFOL;  Surgeon: Jerene Bears, MD;  Location: [facility] END[facility]COPY;  Service: Gastroenterology;  Laterality: N/A;  . EYE SURGERY Bilateral years ago   cataract lens replacment  . LAPAROSCOPY     for bowel obstruction  . PACEMAKER INSERTION  2006   MDT ADAPTA. s/p gen change 2015  . PERMANENT PACEMAKER GENERATOR CHANGE N/A 03/14/2014   MDT ADAPTA. Procedure: PERMANENT PACEMAKER GENERATOR CHANGE;  Surgeon: Sanda Klein, MD; Medtronic Adapta L model number [place], serial number [id number]   . PROSTATE SURGERY    . SAVORY DILATION N/A 09/22/2014   Procedure: SAVORY DILATION;  Surgeon: Jerene Bears, MD;  Location: [facility];  Service: Gastroenterology;  Laterality: N/A;  . TONSILLECTOMY    . TOTAL HIP ARTHROPLASTY Left   . TOTAL HIP ARTHROPLASTY Right 08/17/2017   Procedure: RIGHT TOTAL HIP ARTHROPLASTY ANTERIOR APPROACH;  Surgeon: Rod Can, MD;  Location: [facility];  Service: Orthopedics;  Laterality: Right;  Needs RNFA    Prior to Admission medications   Medication Sig Start Date End Date Taking? Authorizing Provider  dronedarone (MULTAQ) 400 MG tablet Take 1 tablet (400 mg total) by mouth 2 (two)  times daily with a meal. 10/23/17  Yes Dickie La, MD  hydrocortisone (ANUSOL-HC) 25 MG suppository Place 25 mg rectally every 12 (twelve) hours as needed for hemorrhoids or anal itching.   Yes [provider]  magnesium hydroxide (MILK OF MAGNESIA) 400 MG/5ML suspension Take 15 mLs by mouth daily as needed for mild constipation.   Yes [provider]  Multiple Vitamins-Minerals (MULTIVITAMIN WITH MINERALS) tablet Take 1 tablet by mouth daily.   Yes [provider]  Multiple Vitamins-Minerals (OCUVITE-LUTEIN PO) Take 1 tablet by mouth daily.   Yes [provider]  nebivolol (BYSTOLIC) 5 MG  tablet Take 1 tablet (5 mg total) by mouth daily. 12/09/17  Yes [name], Mihai, MD  pantoprazole (PROTONIX) 40 MG tablet Take 1 tablet (40 mg total) by mouth daily before breakfast. 11/19/16  Yes Dickie La, MD  Polyethyl Glycol-Propyl Glycol (SYSTANE FREE OP) Place 1 drop into both eyes 2 (two) times daily. For dry eyes    Yes [provider]  polyethylene glycol (MIRALAX / GLYCOLAX) packet Take 17 g by mouth daily. Patient taking differently: Take 17 g by mouth daily. Mix with 8 oz juice and drink 08/30/15  Yes Haney, Alyssa A, MD  psyllium (HYDROCIL/METAMUCIL) 95 % PACK Take 1 packet by mouth 2 (two) times daily. 03/16/17  Yes Carlyle Dolly, MD  silodosin (RAPAFLO) 4 MG CAPS capsule Take 2 capsules (8 mg total) by mouth daily with breakfast. 02/06/17  Yes Luiz Blare Y, DO  vitamin B-12 (CYANOCOBALAMIN) 500 MCG tablet Take 500 mcg by mouth daily.   Yes [provider]    Scheduled Meds: . amoxicillin-clavulanate  500 mg Oral Q12H  . dronedarone  400 mg Oral BID WC  . enoxaparin (LOVENOX) injection  30 mg Subcutaneous Q24H  . nebivolol  5 mg Oral Daily  . polyethylene glycol  17 g Oral Daily  . psyllium  1 packet Oral BID  . tamsulosin  0.4 mg Oral Daily   Infusions:  PRN Meds:    Allergies as of 01/14/2018 - Review Complete 01/14/2018    Allergen Reaction Noted  . Codeine Nausea Only 07/07/2007    Family History  Problem Relation Age of Onset  . Parkinsonism Mother   . Hypertension Father   . Colon cancer Brother 37  . Hypertension Brother   . Hodgkin's lymphoma Grandchild     Social History   Socioeconomic History  . Marital status: Widowed    Spouse name: Not on file  . Number of children: Not on file  . Years of education: Not on file  . Highest education level: Not on file  Social Needs  . Financial resource strain: Not on file  . Food insecurity - worry: Not on file  . Food insecurity - inability: Not on file  . Transportation needs - medical: Not on file  . Transportation needs - non-medical: Not on file  Occupational History  . Occupation: retired    Fish farm manager: RETIRED  Tobacco Use  . Smoking status: Former Smoker    Packs/day: 1.00    Years: 20.00    Pack years: 20.00    Types: Cigarettes    Last attempt to quit: 11/17/1969    Years since quitting: 48.1  . Smokeless tobacco: Never Used  Substance and Sexual Activity  . Alcohol use: No    Alcohol/week: 0.0 oz    Comment: none since 1990  . Drug use: No  . Sexual activity: Not on file  Other Topics Concern  . Not on file  Social History Narrative   Step son is Clerance Lav; Jenny Reichmann and his wife Janean Sark are primary care takers. They live at [address]   [place] [address]   He is in [place] at [facility] in [place].    REVIEW OF SYSTEMS: Constitutional:  Mobility with wheelchair.  Was up to using a walker after rehab from hip surgery, but with the unsteady gait and weak/dizzy spells, has been placed back into self-propelled wheelchair.   ENT:  No nose bleeds Pulm:  Cough with sputum varying from frothyu white to  yellow.  Very tenacious, hard to cough up sputum.   CV:  No palpitations, no LE edema. No chest pain GU:  No hematuria, no frequency GI:  Per HPI Heme:  No unusual bleeding or bruising.   Transfusions:   Per HPI Neuro:  No headaches, no peripheral tingling or numbness Derm:  No itching, no rash or sores.  Endocrine:  No sweats or chills.  No polyuria or dysuria Immunization:  Reviewed.  Current flu and other vaccines.   Travel:  None beyond local counties in last few months.    PHYSICAL EXAM: Vital signs in last 24 hours: Vitals:   01/15/18 0925 01/15/18 0940  BP:    Pulse:    Resp:    Temp:    SpO2: (!) 83% 92%   Wt Readings from Last 3 Encounters:  01/15/18 175 lb 7.8 oz (79.6 kg)  01/06/18 182 lb 12.8 oz (82.9 kg)  12/31/17 178 lb (80.7 kg)    General: pleasant, elderly WM.  Looks pretty good for 89.  Examined pt in bedside lounge chair Head:  No asymmetry or facial swelling.  No signs head trauma  Eyes:  No scleral icterus or conj pallor Ears:  Not HOH  Nose:  No discharge or congestion. Mouth:  Full upper denture in place.  Several bottom molars are gone or are severely eaten away.  Some injection in blood vessels of uvula, no ulcers or exudate.  Neck:  No mass, TMG Lungs:  Clear bil.  No adventitious sounds. Cough sounds wet.  Heart: RRR.  No mrg.  S1, S2 present.   Abdomen:  Soft, NT, ND.  No mass, HSM, bruits, hernias.   Rectal: deferred.     Musc/Skeltl: no joint swelling, redness.   Extremities:  No CCE  Neurologic:  Oriented x 3.  Good historian.  Moves all 4 limbs.   Skin:  No rash, sores, suspicious lesions.   Tattoos:  None.   Nodes:  No cervical adenopathy.     Psych:  Pleasant, calm, cooperative.     Intake/Output from previous day: 02/28 0701 - 03/01 0700 In: -  Out: 2900 [Urine:2900] Intake/Output this shift: Total I/O In: 350 [P.O.:350] Out: -   LAB RESULTS: Recent Labs    01/14/18 0729 01/15/18 0232  WBC 12.9* 12.0*  HGB 8.7* 8.8*  HCT 26.1* 26.2*  PLT 318 349   BMET Lab Results  Component Value Date   NA 135 01/15/2018   NA 132 (L) 01/14/2018   NA 145 (H) 12/07/2017   K 3.5 01/15/2018   K 4.4 01/14/2018   K 4.6 12/07/2017    CL 98 (L) 01/15/2018   CL 99 (L) 01/14/2018   CL 103 12/07/2017   CO2 24 01/15/2018   CO2 21 (L) 01/14/2018   CO2 28 12/07/2017   GLUCOSE 134 (H) 01/15/2018   GLUCOSE 135 (H) 01/14/2018   GLUCOSE 106 (H) 12/07/2017   BUN 29 (H) 01/15/2018   BUN 25 (H) 01/14/2018   BUN 33 12/07/2017   CREATININE 1.36 (H) 01/15/2018   CREATININE 1.28 (H) 01/14/2018   CREATININE 1.61 (H) 12/07/2017   CALCIUM 8.0 (L) 01/15/2018   CALCIUM 8.3 (L) 01/14/2018   CALCIUM 9.2 12/07/2017   LFT No results for input(s): PROT, ALBUMIN, AST, ALT, ALKPHOS, BILITOT, BILIDIR, IBILI in the last 72 hours. PT/INR Lab Results  Component Value Date   INR 1.17 08/17/2017   INR 1.22 10/14/2013   INR 1.56 (H) 11/02/2012   Hepatitis Panel No  results for input(s): HEPBSAG, HCVAB, HEPAIGM, HEPBIGM in the last 72 hours. C-Diff No components found for: CDIFF Lipase     Component Value Date/Time   LIPASE 14 06/24/2016 1549    Drugs of Abuse  No results found for: LABOPIA, COCAINSCRNUR, LABBENZ, AMPHETMU, THCU, LABBARB   RADIOLOGY STUDIES: Dg Chest 2 View  Result Date: 01/14/2018 CLINICAL DATA:  Shortness of breath. EXAM: CHEST  2 VIEW COMPARISON:  Chest x-ray 03/14/2017. FINDINGS: Cardiac pacer with lead tip over the right atrium right ventricle. Cardiomegaly. Diffuse bilateral pulmonary infiltrates are noted consistent with pulmonary edema and/or pneumonia. Pulmonary infiltrates are primarily in the upper lobes. Small bilateral pleural effusions. No acute bony abnormality. IMPRESSION: Cardiac pacer noted stable position. Cardiomegaly. Diffuse bilateral pulmonary infiltrates, primarily in the upper lobes. Findings are consistent pulmonary edema and/or pneumonia. Small bilateral pleural effusions. Electronically Signed   By: Marcello Moores  Register   On: 01/14/2018 07:51    IMPRESSION:   *   Dysphagia.  Multiple EGDs 2004 - 2015 with empiric esophageal dilatations but never finding of strictures.  Normal manometry 2012.   Distal esophageal narrowing with non-specific esophageal dysmotility on esophagram 2015 have raised ? Of occult esophageal stricture vs cricopharyngeal muscle dysfunction vs esophageal dysmotility as cause.    *   Bil PNA.  On Augmentin.    *  CHF.  No hx of LV dysfunction. Discontinued in last several weeks: Altace and torsemide due to hypotension, rising creatinine, dizziness, weakness.   Contrast enhanced echo today: EF 50 to 55%, hypokinesis of the inferior and inferoseptal myocardium, grade 1 diastolic dyfx, severe right atrial dilation, non-critical multivalvular regurgitation, trivial pericardial effusion, left pleural effusion.     *  Normocytic anemia.  Wonder if the Hgb of 2/14 was accurate.  Received 2 U PRBCs in 08/2014 during admission for elective THR.       PLAN:     *  Esophagram.  Will this get done hopefully tomorrow.  Needs to be NPO for this (ordered)  *  Cancelled the 3/7 GI OV set with PA.  This consult supplants that planned visit.     *  Given the hx of no esophageal strictures on multiple EGDs and perhaps a few weeks in improved swallowing following previous esophageal dilations, not going to set up EGD at this point.    *   Added back home dose of Protonix 40 mg daily.  Warning noted re long QT/arrhythmia potential but he has taken this medication for many years without problems.     Azucena Freed  01/15/2018, 2:19 PM Pager: [phone]   Attending physician's note   I have taken a history, examined the patient and reviewed the chart. I agree with the Advanced Practitioner's note, impression and recommendations.  84 year old male with history of A. fib, sick sinus syndrome status post pacemaker, multiple comorbid conditions here with worsening dysphagia.  He has had multiple EGDs in the past, with hiatal hernia but no esophageal stricture . We will obtain barium esophagram to exclude any mass lesion or tight stricture. Based on history patient likely has  oropharyngeal dysphagia or transfer dysphagia.,  Follow-up swallow study with speech pathology   K Denzil Magnuson, MD [phone] Mon-Fri 8a-5p [phone] after 5p, weekends, holidays

## 2018-01-15 NOTE — Evaluation (Signed)
Physical Therapy Evaluation Patient Details Name: [name] MRN: [id number] DOB: Oct 29, 1920 Today's Date: 01/15/2018   History of Present Illness  Pt is a 85 y.o. male presenting with SOB. CXR shows diffuse bilateral infiltrates, primarily in the upper lobes, consistent with pulmonary edema and/or PNA.  PMH is significant for PAF, HTN, T2DM, sinus node dysfunction, CKD III. He has a h/o esophageal dysphagia, including GERD, dysmotility, and strictures requiring multiple dilations.  Clinical Impression  PTA pt living in [facility], requiring assist for ADLs and iADLs and walking to dining room with RW. Last two weeks he had to use WC to get to dining room. Pt is currently limited by oxygen desaturation and generalized weakness. Pt currently minA for bed mobility, min guard for transfers and minA for stand step transfer from bed to recliner with RW. Pt would benefit from SNF level care at d/c to increase strength and mobility to ultimately return to his ALF environment. PT will continue to follow acutely until d/c.     Follow Up Recommendations SNF([facility] if possible so he can transition back to his [facility])    Equipment Recommendations  None recommended by PT       Precautions / Restrictions Precautions Precautions: Fall Restrictions Weight Bearing Restrictions: No      Mobility  Bed Mobility Overal bed mobility: Needs Assistance Bed Mobility: Supine to Sit     Supine to sit: Min assist     General bed mobility comments: min A for trunk to upright and pad scoot of hips to EoB   Transfers Overall transfer level: Needs assistance Equipment used: Rolling walker (2 wheeled) Transfers: Sit to/from Stand Sit to Stand: Min guard         General transfer comment: min guard for safety, good power up and steadying with RW  Ambulation/Gait Ambulation/Gait assistance: Min assist Ambulation Distance (Feet): 2 Feet Assistive device: Rolling walker (2 wheeled) Gait  Pattern/deviations: Shuffle;Step-to pattern;Decreased step length - right;Decreased step length - left;Trunk flexed Gait velocity: slowed Gait velocity interpretation: Below normal speed for age/gender General Gait Details: minA for steadying with Rw to take steps to recliner, shuffling steps with no LoB, increase in respiration with sactivity      Balance Overall balance assessment: Needs assistance Sitting-balance support: Feet supported;No upper extremity supported Sitting balance-Leahy Scale: Fair     Standing balance support: Bilateral upper extremity supported Standing balance-Leahy Scale: Poor Standing balance comment: requires UE support                             Pertinent Vitals/Pain Pain Assessment: No/denies pain    Home Living Family/patient expects to be discharged to:: Assisted living               Home Equipment: Walker - 2 wheels;Wheelchair - manual Additional Comments: [facility]    Prior Function Level of Independence: Needs assistance   Gait / Transfers Assistance Needed: Patient uses RW for gait. However for last week he has been using WC to get to dining room  ADL's / Homemaking Assistance Needed: help with bathing and dressing         Hand Dominance   Dominant Hand: Right    Extremity/Trunk Assessment   Upper Extremity Assessment Upper Extremity Assessment: Generalized weakness    Lower Extremity Assessment Lower Extremity Assessment: Generalized weakness       Communication   Communication: HOH  Cognition Arousal/Alertness: Awake/alert Behavior During Therapy: [facility]  for tasks assessed/performed Overall Cognitive Status: Within Functional Limits for tasks assessed                                        General Comments General comments (skin integrity, edema, etc.): Pt on 3L via nasal cannula in bed on entry, SaO2 91%O2, with activity transferring to chair respiratory rate increased and pt began more  mouth breathing and SaO2 dropped to 85%O2, pt instructed in pursed lipped breathing and in particular inhalation through nose. SaO2 rebounded to 91%O2. Pt very fatigued once seated in chair     Assessment/Plan    PT Assessment Patient needs continued PT services  PT Problem List Cardiopulmonary status limiting activity;Decreased activity tolerance;Decreased strength       PT Treatment Interventions DME instruction;Gait training;Functional mobility training;Therapeutic activities;Therapeutic exercise;Balance training;Patient/family education    PT Goals (Current goals can be found in the Care Plan section)  Acute Rehab PT Goals Patient Stated Goal: feel better PT Goal Formulation: With patient Time For Goal Achievement: 01/29/18 Potential to Achieve Goals: Fair    Frequency Min 2X/week    AM-PAC PT "6 Clicks" Daily Activity  Outcome Measure Difficulty turning over in bed (including adjusting bedclothes, sheets and blankets)?: A Little Difficulty moving from lying on back to sitting on the side of the bed? : Unable Difficulty sitting down on and standing up from a chair with arms (e.g., wheelchair, bedside commode, etc,.)?: Unable Help needed moving to and from a bed to chair (including a wheelchair)?: A Little Help needed walking in hospital room?: A Lot Help needed climbing 3-5 steps with a railing? : Total 6 Click Score: 11    End of Session Equipment Utilized During Treatment: Gait belt Activity Tolerance: Patient tolerated treatment well;Patient limited by fatigue Patient left: in chair;with call bell/phone within reach;with chair alarm set Nurse Communication: Mobility status PT Visit Diagnosis: Unsteadiness on feet (R26.81);Other abnormalities of gait and mobility (R26.89);Muscle weakness (generalized) (M62.81);Apraxia (R48.2);Difficulty in walking, not elsewhere classified (R26.2)    Time: 3202-3343 PT Time Calculation (min) (ACUTE ONLY): 29 min   Charges:   PT  Evaluation $PT Eval Moderate Complexity: 1 Mod PT Treatments $Therapeutic Activity: 8-22 mins   PT G Codes:        Jhordyn Hoopingarner B. Migdalia Dk PT, DPT Acute Rehabilitation  [phone] Pager [phone]    [place] 01/15/2018, 4:36 PM

## 2018-01-16 ENCOUNTER — Encounter (HOSPITAL_COMMUNITY): Payer: Self-pay | Admitting: Family Medicine

## 2018-01-16 ENCOUNTER — Inpatient Hospital Stay (HOSPITAL_COMMUNITY): Payer: Medicare Other

## 2018-01-16 DIAGNOSIS — I5030 Unspecified diastolic (congestive) heart failure: Secondary | ICD-10-CM

## 2018-01-16 DIAGNOSIS — I5031 Acute diastolic (congestive) heart failure: Secondary | ICD-10-CM | POA: Diagnosis present

## 2018-01-16 DIAGNOSIS — I2721 Secondary pulmonary arterial hypertension: Secondary | ICD-10-CM | POA: Diagnosis present

## 2018-01-16 HISTORY — DX: Unspecified diastolic (congestive) heart failure: I50.30

## 2018-01-16 LAB — CBC
HCT: 27.6 % — ABNORMAL LOW (ref 39.0–52.0)
Hemoglobin: 9.1 g/dL — ABNORMAL LOW (ref 13.0–17.0)
MCH: 28.8 pg (ref 26.0–34.0)
MCHC: 33 g/dL (ref 30.0–36.0)
MCV: 87.3 fL (ref 78.0–100.0)
PLATELETS: 347 10*3/uL (ref 150–400)
RBC: 3.16 MIL/uL — ABNORMAL LOW (ref 4.22–5.81)
RDW: 14.9 % (ref 11.5–15.5)
WBC: 12.6 10*3/uL — AB (ref 4.0–10.5)

## 2018-01-16 LAB — BASIC METABOLIC PANEL
Anion gap: 15 (ref 5–15)
BUN: 31 mg/dL — AB (ref 6–20)
CHLORIDE: 97 mmol/L — AB (ref 101–111)
CO2: 23 mmol/L (ref 22–32)
CREATININE: 1.35 mg/dL — AB (ref 0.61–1.24)
Calcium: 8 mg/dL — ABNORMAL LOW (ref 8.9–10.3)
GFR calc Af Amer: 50 mL/min — ABNORMAL LOW (ref >60)
GFR calc non Af Amer: 43 mL/min — ABNORMAL LOW (ref >60)
Glucose, Bld: 139 mg/dL — ABNORMAL HIGH (ref 65–99)
Potassium: 3.7 mmol/L (ref 3.5–5.1)
Sodium: 135 mmol/L (ref 135–145)

## 2018-01-16 LAB — MAGNESIUM: MAGNESIUM: 2.3 mg/dL (ref 1.7–2.4)

## 2018-01-16 LAB — BRAIN NATRIURETIC PEPTIDE: B Natriuretic Peptide: 421.5 pg/mL — ABNORMAL HIGH (ref 0.0–100.0)

## 2018-01-16 MED ORDER — SODIUM CHLORIDE 0.9 % IV SOLN
2.0000 g | INTRAVENOUS | Status: DC
  Administered 2018-01-16 – 2018-01-17 (×2): 2 g via INTRAVENOUS
  Filled 2018-01-16 (×3): qty 2

## 2018-01-16 MED ORDER — VANCOMYCIN HCL 10 G IV SOLR
1500.0000 mg | Freq: Once | INTRAVENOUS | Status: AC
  Administered 2018-01-16: 1500 mg via INTRAVENOUS
  Filled 2018-01-16: qty 1500

## 2018-01-16 MED ORDER — VANCOMYCIN HCL IN DEXTROSE 1-5 GM/200ML-% IV SOLN
1000.0000 mg | INTRAVENOUS | Status: DC
  Administered 2018-01-17: 1000 mg via INTRAVENOUS
  Filled 2018-01-16: qty 200

## 2018-01-16 NOTE — Progress Notes (Signed)
Pharmacy Antibiotic Note  [name] is a 84 y.o. male admitted on 01/14/2018 with pneumonia.  Pharmacy has been consulted for vancomycin/cefepime dosing. Afebrile, WBC 12.6 stable, SCr 1.35 stable.  Plan: Cefepime 2g IV q24h Vancomycin 1500mg  IV x 1; then 1g IV q24h Monitor clinical progress, c/s, renal function F/u de-escalation plan/LOT, vancomycin trough as indicated   Height: 5\' 6"  (167.6 cm) Weight: 186 lb (84.4 kg) IBW/kg (Calculated) : 63.8  Temp (24hrs), Avg:97.8 F (36.6 C), Min:97.6 F (36.4 C), Max:98.2 F (36.8 C)  Recent Labs  Lab 01/14/18 0729 01/14/18 0745 01/15/18 0232 01/16/18 0256  WBC 12.9*  --  12.0* 12.6*  CREATININE 1.28*  --  1.36* 1.35*  LATICACIDVEN  --  1.58  --   --     Estimated Creatinine Clearance: 33.3 mL/min (A) (by C-G formula based on SCr of 1.35 mg/dL (H)).    Allergies  Allergen Reactions  . Codeine Nausea Only    Elicia Lamp, PharmD, BCPS Clinical Pharmacist 01/16/2018 7:54 PM

## 2018-01-16 NOTE — Progress Notes (Signed)
Family Medicine Teaching Service Daily Progress Note Intern Pager: [phone]  Patient name: [name] Medical record number: [id number] Date of birth: 1920/10/25 Age: 84 y.o. Gender: male  Primary Care Provider: Dickie La, MD Consultants: None Code Status: DNR  Pt Overview and Major Events to Date:  Admitted to [facility] on 01/14/18   Assessment and Plan: [name] is a 84 y.o. male presenting with SOB. PMH is significant for PAF, HTN, T2DM, sinus node dysfunction, CKD III.   Acute on chronic respiratory failure 2/2 CHF exacerbation BNP 854 on admission. Echo with 50-55% EF, G2DD, hypokinesis of inferior and inferoseptal myocardium. New O2 requirement (on RA at [facility]). Patient only received lasix 40 mg x1 yesterday due to soft BP, had better diuresis the day before with two doses of 40 mg IV lasix. Wt increased at 185 lb from 175 lb. Only 900 ml UOP yesterday.   - plan for 40 mg IV lasix x2 today if BP can tolerate  - continuous pulse ox, supplemental O2 prn  - cardiology will see at follow up - strict I/Os and daily weights   Aspiration PNA - new O2 requirement. WBC 12.6. Now on dysphagia III diet and eval by GI as noted below. Afebrile. - continue day #3 augmentin (2/28- ) for aspiration PNA  Dysphagia in setting of likely aspiration pneumonia - Has had previous EGD showing smooth esophageal narrowing, and h/o multiple esophageal dilations in the past with a few weeks of improved swallowing, but no history of true stricture. GI consulted and pt NPO for esophogram this AM. - Possible etiology of aspiration pneumonia.  - GI consulted, appreciate recommendations - continue to work with SLP - dysphagia III when not NPO for procedure  Chest Pain - resolved Likely related to dyspnea as above. EKG with paced rhythm with no ST changes. Well's score of 0 so doubt PE as etiology of chest pain. Troponins trended at 0.03>0.03>0.03.   Atrial fibrillation Chronic, stable. CHADSVasc score  of 4 on admission.HASBLED score of 2. Previously on Xarelto for anticoagulation this was DC'd prior to admit due to advanced age and propensity to fall. Managed with dronedarone 400mg  tablet bid, and nebivolol 5 mg daily.  -continue dronedarone 400mg  tablet bid  -continue nebivolol 5 mg daily  Sinus node dysfunction/av block/Pacemaker Patient with prolonged QT/qtc interval on past ekgs. QTc >550 on admission. He does have a pacemaker. -continue dronederone400 mg twice a day >>> relative contraindication in HF, plan to continue per cardiology curbside (see separate note from Dr. Debara Pickett) -continue nebivolol5 mg daily  -Avoid QT prolonging medications  Hypertension - mildly hypotensive overnight with lowest BP 108/59, 111/47.  Home meds: nebivolol. -nebivolol 5 mg  CKD Stage III Current Cr of 1.37 from 1.28 on admit with GFR improved at 31 from 43.  BL Cr 1.1-1.3.  -daily bmp -watch Cr with IV diuresis   Type II Diabetes Patient with past diagnosis of T2DM.Last A1c 5.8.Does not take any oral medication or insulin. Glucose in 130s -monitor CBG on BMP, can add SSI as needed  Sacral ulcer, stage 1 New stage 1 pressure ulcer, likely 2/2 decreased motility.  -will need frequent positional changes -apply foam dressing to relieve pressure -will change mattress to avoid increased pressure   FEN/GI:Dysphagia 3 diet (NPO prior to esophogram) Prophylaxis:Lovenox    Disposition: continued inpatient stay for diuresis and PNA management  Subjective:  Patient now on 4L O2, feels coughing and breathing is worse today. No complaints of CP.  Cough is his biggest discomfort.  Objective: Temp:  [97.7 F (36.5 C)-98.2 F (36.8 C)] 97.7 F (36.5 C) (03/02 0539) Pulse Rate:  [74-78] 74 (03/02 0539) Resp:  [20-22] 20 (03/02 0539) BP: (108-111)/(47-59) 111/47 (03/02 0539) SpO2:  [91 %-93 %] 93 % (03/02 0539) Weight:  [186 lb (84.4 kg)] 186 lb (84.4 kg) (03/02 0500) Physical Exam:   General: NAD, [place] in place Cardiovascular: RRR, no murmurs  Respiratory: diffuses crackles throughout, course breath sounds throughout, +wheezing throughout Abdomen: soft, nt, nd Extremities: non tender, 1+ edema to mid shin  Laboratory: Recent Labs  Lab 01/14/18 0729 01/15/18 0232 01/16/18 0256  WBC 12.9* 12.0* 12.6*  HGB 8.7* 8.8* 9.1*  HCT 26.1* 26.2* 27.6*  PLT 318 349 347   Recent Labs  Lab 01/14/18 0729 01/15/18 0232 01/16/18 0256  NA 132* 135 135  K 4.4 3.5 3.7  CL 99* 98* 97*  CO2 21* 24 23  BUN 25* 29* 31*  CREATININE 1.28* 1.36* 1.35*  CALCIUM 8.3* 8.0* 8.0*  GLUCOSE 135* 134* 139*    Ref. Range 01/14/2018 14:58 01/14/2018 21:02 01/15/2018 02:32  Troponin I Latest Ref Range: <0.03 ng/mL 0.03 (HH) 0.03 (HH) 0.03 (HH)    Ref. Range 01/15/2018 02:32  TSH Latest Ref Range: 0.350 - 4.500 uIU/mL 1.946    Imaging/Diagnostic Tests: Dg Chest 2 View  Result Date: 01/14/2018 CLINICAL DATA:  Shortness of breath. EXAM: CHEST  2 VIEW COMPARISON:  Chest x-ray 03/14/2017. FINDINGS: Cardiac pacer with lead tip over the right atrium right ventricle. Cardiomegaly. Diffuse bilateral pulmonary infiltrates are noted consistent with pulmonary edema and/or pneumonia. Pulmonary infiltrates are primarily in the upper lobes. Small bilateral pleural effusions. No acute bony abnormality. IMPRESSION: Cardiac pacer noted stable position. Cardiomegaly. Diffuse bilateral pulmonary infiltrates, primarily in the upper lobes. Findings are consistent pulmonary edema and/or pneumonia. Small bilateral pleural effusions. Electronically Signed   By: Marcello Moores  Register   On: 01/14/2018 07:51     Everrett Coombe, MD 01/16/2018, 9:30 AM PGY-2, [place] Intern pager: [phone], text pages welcome

## 2018-01-17 LAB — CBC
HEMATOCRIT: 29.3 % — AB (ref 39.0–52.0)
Hemoglobin: 9.7 g/dL — ABNORMAL LOW (ref 13.0–17.0)
MCH: 29.3 pg (ref 26.0–34.0)
MCHC: 33.1 g/dL (ref 30.0–36.0)
MCV: 88.5 fL (ref 78.0–100.0)
Platelets: 361 10*3/uL (ref 150–400)
RBC: 3.31 MIL/uL — ABNORMAL LOW (ref 4.22–5.81)
RDW: 15 % (ref 11.5–15.5)
WBC: 13.7 10*3/uL — AB (ref 4.0–10.5)

## 2018-01-17 LAB — BASIC METABOLIC PANEL
ANION GAP: 10 (ref 5–15)
BUN: 28 mg/dL — ABNORMAL HIGH (ref 6–20)
CALCIUM: 7.8 mg/dL — AB (ref 8.9–10.3)
CO2: 25 mmol/L (ref 22–32)
Chloride: 97 mmol/L — ABNORMAL LOW (ref 101–111)
Creatinine, Ser: 1.33 mg/dL — ABNORMAL HIGH (ref 0.61–1.24)
GFR calc Af Amer: 51 mL/min — ABNORMAL LOW (ref >60)
GFR, EST NON AFRICAN AMERICAN: 44 mL/min — AB (ref >60)
Glucose, Bld: 133 mg/dL — ABNORMAL HIGH (ref 65–99)
Potassium: 3.9 mmol/L (ref 3.5–5.1)
SODIUM: 132 mmol/L — AB (ref 135–145)

## 2018-01-17 MED ORDER — BOOST / RESOURCE BREEZE PO LIQD CUSTOM
1.0000 | Freq: Three times a day (TID) | ORAL | Status: DC
Start: 2018-01-17 — End: 2018-01-27
  Administered 2018-01-17 – 2018-01-27 (×25): 1 via ORAL

## 2018-01-17 MED ORDER — POLYETHYLENE GLYCOL 3350 17 G PO PACK
17.0000 g | PACK | Freq: Two times a day (BID) | ORAL | Status: DC
  Administered 2018-01-17 – 2018-01-27 (×17): 17 g via ORAL
  Filled 2018-01-17 (×17): qty 1

## 2018-01-17 NOTE — NC FL2 (Signed)
[place] MEDICAID FL2 LEVEL OF CARE SCREENING TOOL     IDENTIFICATION  Patient Name: [name] Birthdate: 01/20/1920 Sex: male Admission Date (Current Location): 01/14/2018  [facility] and [place] Number:  Herbalist and Address:  [facility], [place] [address], [place], [place] [address]      Provider Number: [id number]  Attending Physician Name and Address:  McDiarmid, Blane Ohara, MD  Relative Name and Phone Number:       Current Level of Care: Hospital Recommended Level of Care: [place] Prior Approval Number:    Date Approved/Denied:   PASRR Number: [id number]  Discharge Plan: SNF    Current Diagnoses: Patient Active Problem List   Diagnosis Date Noted  . Pulmonary artery hypertension ([place]) 01/16/2018  . Acute diastolic congestive heart failure ([place]) 01/16/2018  . Heart failure with preserved ejection fraction ([place]) 01/16/2018  . Hypoxemia 01/14/2018  . Esophageal dysmotility 01/14/2018  . Cough with sputum 01/14/2018  . Leukocytosis 01/14/2018  . Elevated brain natriuretic peptide (BNP) level 01/14/2018  . Esophageal dysphagia 01/14/2018  . Bilateral pulmonary infiltrates on chest x-ray 01/14/2018  . Prolonged Q-T interval on ECG 01/14/2018  . Aspiration pneumonia of both upper lobes due to regurgitated food ([place])   . Postop check   . Elective surgery   . Normocytic anemia   . Essential hypertension   . Second degree AV block 11/29/2014  . Abnormal esophagram 09/07/2014  . Dysphagia 08/23/2014  . Pacemaker - Medtronic Adapta 2006, gen change 2015 07/23/2013  . Chronic right hip pain 09/22/2012  . Testicular swelling, right 09/22/2012  . Presbycusis 05/14/2012  . Paroxysmal atrial fibrillation ([place]) 01/30/2012  . Hx SBO 01/30/2012  . S/P appendectomy 01/30/2012  . CHRONIC KIDNEY DISEASE STAGE III (MODERATE) 12/03/2010  . ALLERGIC RHINITIS 09/05/2009  . HOARSENESS 05/15/2009  . HIATAL HERNIA 12/14/2008  .  Diverticulosis of large intestine 12/14/2008  . COLONIC POLYPS, HYPERPLASTIC, HX OF 12/14/2008  . ESOPHAGEAL STRICTURE 10/04/2008  . Other vitamin B12 deficiency anemia 01/26/2008  . GLAUCOMA 01/14/2007  . CATARACT 01/14/2007  . SICK SINUS SYNDROME 01/14/2007  . GASTROESOPHAGEAL REFLUX, NO ESOPHAGITIS 01/14/2007    Orientation RESPIRATION BLADDER Height & Weight     Self, Time, Situation, Place  O2(3L [place]) Incontinent, External catheter Weight: 180 lb (81.6 kg) Height:  5\' 6"  (167.6 cm)  BEHAVIORAL SYMPTOMS/MOOD NEUROLOGICAL BOWEL NUTRITION STATUS      Continent Diet(see DC summary)  AMBULATORY STATUS COMMUNICATION OF NEEDS Skin   Limited Assist Verbally PU Stage and Appropriate Care   PU Stage 2 Dressing: (buttocks- foam dressing)                   Personal Care Assistance Level of Assistance  Dressing, Bathing Bathing Assistance: Limited assistance   Dressing Assistance: Limited assistance     Functional Limitations Info             SPECIAL CARE FACTORS FREQUENCY  PT (By licensed PT), OT (By licensed OT)     PT Frequency: 5/wk OT Frequency: 5/wk            Contractures      Additional Factors Info  Code Status, Allergies Code Status Info: DNR Allergies Info: Codeine           Current Medications (01/17/2018):  This is the current hospital active medication list Current Facility-Administered Medications  Medication Dose Route Frequency Provider Last Rate Last Dose  . ceFEPIme (MAXIPIME) 2 g  in sodium chloride 0.9 % 100 mL IVPB  2 g Intravenous Q24H Romona Curls, [place]   Stopped at 01/17/18 4287  . dronedarone (MULTAQ) tablet 400 mg  400 mg Oral BID WC Nicolette Bang, DO   400 mg at 01/17/18 0849  . enoxaparin (LOVENOX) injection 30 mg  30 mg Subcutaneous Q24H Nicolette Bang, DO   30 mg at 01/16/18 2105  . feeding supplement (BOOST / RESOURCE BREEZE) liquid 1 Container  1 Container Oral TID BM McDiarmid, Blane Ohara, MD   1 Container at  01/17/18 1358  . nebivolol (BYSTOLIC) tablet 5 mg  5 mg Oral Daily Nicolette Bang, DO   5 mg at 01/17/18 0848  . pantoprazole (PROTONIX) EC tablet 40 mg  40 mg Oral Q0600 Vena Rua, PA-C   40 mg at 01/17/18 6811  . polyethylene glycol (MIRALAX / GLYCOLAX) packet 17 g  17 g Oral BID Tammi Klippel, [place], DO      . psyllium (HYDROCIL/METAMUCIL) packet 1 packet  1 packet Oral BID Nicolette Bang, DO   1 packet at 01/17/18 0848  . tamsulosin (FLOMAX) capsule 0.4 mg  0.4 mg Oral Daily Nicolette Bang, DO   0.4 mg at 01/17/18 0848  . vancomycin (VANCOCIN) IVPB 1000 mg/200 mL premix  1,000 mg Intravenous Q24H Romona Curls, [facility]         Discharge Medications: Please see discharge summary for a list of discharge medications.  Relevant Imaging Results:  Relevant Lab Results:   Additional Information SS#: [id number]  Jorge Ny, LCSW

## 2018-01-17 NOTE — Progress Notes (Addendum)
Family Medicine Teaching Service Daily Progress Note Intern Pager: [phone]  Patient name: [name] Medical record number: [id number] Date of birth: Jun 09, 1920 Age: 84 y.o. Gender: male  Primary Care Provider: Dickie La, MD Consultants: GI, curbside cariology  Code Status: DNR  Pt Overview and Major Events to Date:  Admitted to [place] [facility] on 01/14/18  Assessment and Plan: [name] Melvinis a 84 y.o.malepresenting with SOB. PMH is significant forPAF, HTN, T2DM, sinus node dysfunction, CKD III.   Acute on chronic respiratory failure 2/2 CHF exacerbation BNP improving to 421.5 from 854 on admission. Echo with 50-55% EF, G2DD, hypokinesis of inferior and inferoseptal myocardium. New O2 requirement (on RA at [facility]), currently satting at 97% on 3L O2. S/p 3 doses of IV 40mg  lasix. Net neg -3.38L since admission, only -300cc yesterday. Wt decreased to 180lbs from 186lbs on 3/2. at 185 lb from 175 lb.  -can consider 40 mg IV lasix x2 today if BP can tolerate  -continuous pulse ox, supplemental O2 prn -outpatient cardiology follow up -strict I/Os and daily weights  -can consider duoneb treatment given wheezing heard on exam today   Aspiration PNA  New O2 requirement. Worsening leukocytosis to 13.7, from 12.6 on 3/2. Now on dysphagia III diet and eval by GI as noted below. Afebrile. -continue day #4 augmentin (2/28- ) for aspiration PNA  Dysphagia in setting of likely aspiration pneumonia  Has had previous EGD showing smooth esophageal narrowing, and h/o multiple esophageal dilations in the past with a few weeks of improved swallowing, but no history of true stricture. GI consulted plan for esophogram on 3/4. -Possible etiology of aspiration pneumonia.  -GI consulted, appreciate recommendations -continue to work with SLP -dysphagia III when not NPO for procedure  Chest Pain - resolved Likely related to dyspnea as above.EKG with paced rhythm with no ST changes. Well's score of 0 so  doubt PE as etiology of chest pain.Troponins trended at 0.03>0.03>0.03.   Atrial fibrillation Chronic, stable. CHADSVasc score of 4 on admission.HASBLED score of 2. Previously on Xarelto for anticoagulation this was DC'd prior to admit due to advanced age and propensity to fall.Managed with dronedarone 400mg  tablet bid, and nebivolol 5mg  daily.  -continue dronedarone 400mg  tablet bid, per Cardiology can continue as LVEF >35% -continue nebivolol5mg  daily  Sinus node dysfunction/av block/Pacemaker Patient with prolonged QT/qtc interval on past ekgs.QTc >550 on admission. He does have a pacemaker. -continue dronederone400 mg twice a day >>> relative contraindication in HF, plan to continue per cardiology curbside (see separate note from Dr. Debara Pickett) -continue nebivolol5 mg daily -Avoid QT prolonging medications  Hyponatremia Na of 132, 135 on 3/2. Possibly 2/2 decreased PO intake due to difficulty swallowing.  -can consider fluid repletion with NS   Hypertension  Mildly hypotensive with BP 119/51  Home meds: nebivolol. -nebivolol5mg   CKD Stage III Current Cr of 1.33 from 1.28 on admit with GFR improved at 44 from 43.  BL Cr 1.1-1.3. -daily bmp  Type II Diabetes Patient with past diagnosis of T2DM. Last A1c 5.8 on 08/10/2017.Does not take any oral medication or insulin.Glucose in 130s -monitor CBG on BMP, can add SSI as needed  Sacral ulcer, stage 1 New stage 1 pressure ulcer, likely 2/2 decreased motility.  -will need frequent positional changes -apply foam dressing to relieve pressure -will change mattress to avoid increased pressure   FEN/GI:Dysphagia 3 diet (NPO prior to esophogram) Prophylaxis:Lovenox  Disposition: continued inpatient stay  Subjective:  Patient states he is having continued trouble with swallowing.  States he has not been able to eat breakfast and is distressed by this. States he has been having trouble breathing due to this.    Objective: Temp:  [97.6 F (36.4 C)-98.8 F (37.1 C)] 98.8 F (37.1 C) (03/03 0502) Pulse Rate:  [68-72] 70 (03/03 0502) Resp:  [18-20] 18 (03/02 2100) BP: (104-119)/(46-51) 119/51 (03/03 0502) SpO2:  [92 %-98 %] 97 % (03/03 0502) Weight:  [180 lb (81.6 kg)] 180 lb (81.6 kg) (03/03 0500)   Intake/Output Summary (Last 24 hours) at 01/17/2018 1017 Last data filed at 01/17/2018 0503 Gross per 24 hour  Intake 100 ml  Output 400 ml  Net -300 ml    Physical Exam: General: awake and alert, laying in bed,  in place Cardiovascular: RRR, no MRG Respiratory: expiratory wheezing heard throughought  Abdomen: soft, non tender, distended, bowel sounds normal  Extremities: no edema, non tender Skin: sacral ulcer stable without changes from previous exam   Laboratory: Recent Labs  Lab 01/15/18 0232 01/16/18 0256 01/17/18 0457  WBC 12.0* 12.6* 13.7*  HGB 8.8* 9.1* 9.7*  HCT 26.2* 27.6* 29.3*  PLT 349 347 361   Recent Labs  Lab 01/15/18 0232 01/16/18 0256 01/17/18 0457  NA 135 135 132*  K 3.5 3.7 3.9  CL 98* 97* 97*  CO2 24 23 25   BUN 29* 31* 28*  CREATININE 1.36* 1.35* 1.33*  CALCIUM 8.0* 8.0* 7.8*  GLUCOSE 134* 139* 133*    Ref. Range 01/16/2018 10:46  B Natriuretic Peptide Latest Ref Range: 0.0 - 100.0 pg/mL 421.5 (H)   CBG (last 3)  No results for input(s): GLUCAP in the last 72 hours.   Imaging/Diagnostic Tests: Dg Chest 2 View  Result Date: 01/16/2018 CLINICAL DATA:  Shortness of breath EXAM: CHEST  2 VIEW COMPARISON:  January 14, 2018 FINDINGS: Worsening bilateral infiltrates, right greater than left, most prominent in the upper lungs. No other changes. IMPRESSION: Worsening bilateral upper lobe predominant infiltrates. Recommend follow-up to resolution. Electronically Signed   By: Dorise Bullion III M.D   On: 01/16/2018 11:24   Dg Chest 2 View  Result Date: 01/14/2018 CLINICAL DATA:  Shortness of breath. EXAM: CHEST  2 VIEW COMPARISON:  Chest x-ray 03/14/2017.  FINDINGS: Cardiac pacer with lead tip over the right atrium right ventricle. Cardiomegaly. Diffuse bilateral pulmonary infiltrates are noted consistent with pulmonary edema and/or pneumonia. Pulmonary infiltrates are primarily in the upper lobes. Small bilateral pleural effusions. No acute bony abnormality. IMPRESSION: Cardiac pacer noted stable position. Cardiomegaly. Diffuse bilateral pulmonary infiltrates, primarily in the upper lobes. Findings are consistent pulmonary edema and/or pneumonia. Small bilateral pleural effusions. Electronically Signed   By: Marcello Moores  Register   On: 01/14/2018 07:51    Caroline More, DO 01/17/2018, 10:12 AM PGY-1, [place] Intern pager: [phone], text pages welcome

## 2018-01-18 ENCOUNTER — Inpatient Hospital Stay (HOSPITAL_COMMUNITY): Payer: Medicare Other

## 2018-01-18 LAB — BASIC METABOLIC PANEL
ANION GAP: 11 (ref 5–15)
BUN: 27 mg/dL — AB (ref 6–20)
CHLORIDE: 99 mmol/L — AB (ref 101–111)
CO2: 24 mmol/L (ref 22–32)
Calcium: 7.8 mg/dL — ABNORMAL LOW (ref 8.9–10.3)
Creatinine, Ser: 1.14 mg/dL (ref 0.61–1.24)
GFR calc Af Amer: >60 mL/min (ref >60)
GFR calc non Af Amer: 53 mL/min — ABNORMAL LOW (ref >60)
Glucose, Bld: 130 mg/dL — ABNORMAL HIGH (ref 65–99)
POTASSIUM: 3.8 mmol/L (ref 3.5–5.1)
Sodium: 134 mmol/L — ABNORMAL LOW (ref 135–145)

## 2018-01-18 LAB — CBC
HEMATOCRIT: 28.5 % — AB (ref 39.0–52.0)
HEMOGLOBIN: 9.3 g/dL — AB (ref 13.0–17.0)
MCH: 29 pg (ref 26.0–34.0)
MCHC: 32.6 g/dL (ref 30.0–36.0)
MCV: 88.8 fL (ref 78.0–100.0)
Platelets: 351 10*3/uL (ref 150–400)
RBC: 3.21 MIL/uL — AB (ref 4.22–5.81)
RDW: 15.2 % (ref 11.5–15.5)
WBC: 13.5 10*3/uL — ABNORMAL HIGH (ref 4.0–10.5)

## 2018-01-18 MED ORDER — METHYLPREDNISOLONE SODIUM SUCC 125 MG IJ SOLR
125.0000 mg | Freq: Every day | INTRAMUSCULAR | Status: DC
Start: 2018-01-18 — End: 2018-01-20
  Administered 2018-01-18 – 2018-01-19 (×2): 125 mg via INTRAVENOUS
  Filled 2018-01-18 (×2): qty 2

## 2018-01-18 MED ORDER — AMPICILLIN-SULBACTAM SODIUM 3 (2-1) G IJ SOLR
3.0000 g | Freq: Three times a day (TID) | INTRAMUSCULAR | Status: DC
  Administered 2018-01-18 – 2018-01-21 (×9): 3 g via INTRAVENOUS
  Filled 2018-01-18 (×10): qty 3

## 2018-01-18 MED ORDER — FUROSEMIDE 10 MG/ML IJ SOLN
40.0000 mg | Freq: Once | INTRAMUSCULAR | Status: AC
  Administered 2018-01-18: 40 mg via INTRAVENOUS
  Filled 2018-01-18: qty 4

## 2018-01-18 MED ORDER — SENNOSIDES-DOCUSATE SODIUM 8.6-50 MG PO TABS
1.0000 | ORAL_TABLET | Freq: Two times a day (BID) | ORAL | Status: DC
  Administered 2018-01-18 – 2018-01-27 (×18): 1 via ORAL
  Filled 2018-01-18 (×18): qty 1

## 2018-01-18 MED ORDER — MAGNESIUM HYDROXIDE 400 MG/5ML PO SUSP
30.0000 mL | Freq: Once | ORAL | Status: AC
  Administered 2018-01-18: 30 mL via ORAL
  Filled 2018-01-18: qty 30

## 2018-01-18 NOTE — Progress Notes (Signed)
After consultation with pharmacy, abx changed to unasyn to cover for anearobes

## 2018-01-18 NOTE — Clinical Social Work Note (Signed)
Clinical Social Work Assessment  Patient Details  Name: [name] MRN: [id number] Date of Birth: [birth year]  Date of referral:  01/18/18               Reason for consult:  Facility Placement                Permission sought to share information with:  Facility Sport and exercise psychologist, Family Supports Permission granted to share information::  Yes, Verbal Permission Granted  Name::     [name]  Agency::  SNFs  Relationship::  Daughter-in-law  Contact Information:  [phone]  Housing/Transportation Living arrangements for the past 2 months:  [place] of Information:  Patient, Adult Children Patient Interpreter Needed:  None Criminal Activity/Legal Involvement Pertinent to Current Situation/Hospitalization:  No - Comment as needed Significant Relationships:  Adult Children Lives with:  Facility Resident Do you feel safe going back to the place where you live?  No Need for family participation in patient care:  Yes (Comment)  Care giving concerns:  CSW received consult for possible SNF placement at time of discharge. CSW spoke with patient and [name] regarding PT recommendation of SNF placement at time of discharge. Patient resides at [facility]. Patient expressed understanding of PT recommendation and is agreeable to SNF placement at time of discharge. CSW to continue to follow and assist with discharge planning needs.   Social Worker assessment / plan:  CSW spoke with patient and [name] concerning possibility of rehab at [facility] before returning home.  Employment status:  Retired Forensic scientist:  Medicare PT Recommendations:  [place] / Referral to community resources:  [place]  Patient/Family's Response to care:  Patient recognizes need for rehab before returning home and is agreeable to a SNF in [place]. Patient reported preference for [facility] since it is the same company as his  ALF.  Patient/Family's Understanding of and Emotional Response to Diagnosis, Current Treatment, and Prognosis:  Patient/family is realistic regarding therapy needs and expressed being hopeful for SNF placement. Patient expressed understanding of CSW role and discharge process as well as medical condition. No questions/concerns about plan or treatment.    Emotional Assessment Appearance:  Appears stated age Attitude/Demeanor/Rapport:  Other(Appropriate) Affect (typically observed):  Accepting, Appropriate Orientation:  Oriented to Self, Oriented to Situation, Oriented to Place, Oriented to  Time Alcohol / Substance use:  Not Applicable Psych involvement (Current and /or in the community):  No (Comment)  Discharge Needs  Concerns to be addressed:  Care Coordination Readmission within the last 30 days:  No Current discharge risk:  None Barriers to Discharge:  Continued Medical Work up   [employer], [place] 01/18/2018, 4:07 PM

## 2018-01-18 NOTE — Care Management Important Message (Signed)
Important Message  Patient Details  Name: [name] MRN: [id number] Date of Birth: 1920-09-24   Medicare Important Message Given:  Yes    Dorthy Magnussen 01/18/2018, 4:03 PM

## 2018-01-18 NOTE — Progress Notes (Signed)
Physical Therapy Treatment Patient Details Name: [name] MRN: [id number] DOB: [birth year] Today's Date: 01/18/2018    History of Present Illness Pt is a 84 y.o. male presenting with SOB. CXR shows diffuse bilateral infiltrates, primarily in the upper lobes, consistent with pulmonary edema and/or PNA.  PMH is significant for PAF, HTN, T2DM, sinus node dysfunction, CKD III. He has a h/o esophageal dysphagia, including GERD, dysmotility, and strictures requiring multiple dilations.    PT Comments    Pt is making slow progress towards his goals today, however continues to be limited in his safe mobility by DoE and oxygen desaturation with activity (see General Comments). Pt is currently min A for bed mobility, min guard for sit<>stand and minA for ambulation of 8 feet with RW. D/c plans remain appropriate at this time. PT will continue to follow acutely until d/c.     Follow Up Recommendations  SNF([facility] if possible so he can transition back to his [facility])     Equipment Recommendations  None recommended by PT    Recommendations for Other Services       Precautions / Restrictions Precautions Precautions: Fall Restrictions Weight Bearing Restrictions: No    Mobility  Bed Mobility Overal bed mobility: Needs Assistance Bed Mobility: Supine to Sit     Supine to sit: Min assist     General bed mobility comments: min A for trunk to upright   Transfers Overall transfer level: Needs assistance Equipment used: Rolling walker (2 wheeled) Transfers: Sit to/from Stand Sit to Stand: Min guard         General transfer comment: min guard for safety, good power up and steadying with RW  Ambulation/Gait Ambulation/Gait assistance: Min assist Ambulation Distance (Feet): 8 Feet Assistive device: Rolling walker (2 wheeled) Gait Pattern/deviations: Shuffle;Step-to pattern;Decreased step length - right;Decreased step length - left;Trunk flexed Gait velocity: slowed Gait  velocity interpretation: Below normal speed for age/gender General Gait Details: minA for steadying with RW, pt with 3/4 DoE with ambulation to recliner      Balance Overall balance assessment: Needs assistance Sitting-balance support: Feet supported;No upper extremity supported Sitting balance-Leahy Scale: Fair     Standing balance support: Bilateral upper extremity supported Standing balance-Leahy Scale: Poor Standing balance comment: requires UE support                            Cognition Arousal/Alertness: Awake/alert Behavior During Therapy: WFL for tasks assessed/performed Overall Cognitive Status: Within Functional Limits for tasks assessed                                           General Comments General comments (skin integrity, edema, etc.): Pt on 4L O2 via nasal cannula at entry with SaO2 of 98%O2 and HR of 77 bpm, with activity SaO2 dropped to 87%O2, pt instructed in breathing in through nose and SaO2 quickly recovered to 95%O2. HR increased with activity to 105 bpm      Pertinent Vitals/Pain Pain Assessment: No/denies pain           PT Goals (current goals can now be found in the care plan section) Acute Rehab PT Goals Patient Stated Goal: feel better PT Goal Formulation: With patient Time For Goal Achievement: 01/29/18 Potential to Achieve Goals: Fair    Frequency    Min 2X/week  PT Plan Current plan remains appropriate    Co-evaluation              AM-PAC PT "6 Clicks" Daily Activity  Outcome Measure  Difficulty turning over in bed (including adjusting bedclothes, sheets and blankets)?: A Little Difficulty moving from lying on back to sitting on the side of the bed? : Unable Difficulty sitting down on and standing up from a chair with arms (e.g., wheelchair, bedside commode, etc,.)?: Unable Help needed moving to and from a bed to chair (including a wheelchair)?: A Little Help needed walking in hospital  room?: A Lot Help needed climbing 3-5 steps with a railing? : Total 6 Click Score: 11    End of Session Equipment Utilized During Treatment: Gait belt Activity Tolerance: Patient tolerated treatment well;Patient limited by fatigue Patient left: in chair;with call bell/phone within reach;with chair alarm set Nurse Communication: Mobility status PT Visit Diagnosis: Unsteadiness on feet (R26.81);Other abnormalities of gait and mobility (R26.89);Muscle weakness (generalized) (M62.81);Apraxia (R48.2);Difficulty in walking, not elsewhere classified (R26.2)     Time: 7588-3254 PT Time Calculation (min) (ACUTE ONLY): 28 min  Charges:  $Gait Training: 8-22 mins $Therapeutic Activity: 8-22 mins                    G Codes:       Mylisa Brunson B. Migdalia Dk PT, DPT Acute Rehabilitation  [phone] Pager [phone]     [place] 01/18/2018, 3:56 PM

## 2018-01-18 NOTE — Progress Notes (Addendum)
Daily Rounding Note  01/18/2018, 2:01 PM  LOS: 4 days   SUBJECTIVE:     No radiology assured me on 3/1 that they could do the esophagram following day, Saturday.  The esophagram was not performed because it was not deemed urgent.  Instead of putting the order in to be done today, Monday, and the order just fell off and radiology check needs to have the order reentered.  Patient ate breakfast this morning so he cannot have today. Over w/e patient had a bout of aspiration and resp distress.  Cefepime and vanc added but changed to Unasyn today.  CXR with pleural effusions, more c/w pulm edema than PNA.   Patient also requested because he feels constipated and has not had a bowel movement despite daily MiraLAX, BID psyllium.  OBJECTIVE:         Vital signs in last 24 hours:    Temp:  [97.5 F (36.4 C)-98.3 F (36.8 C)] 97.5 F (36.4 C) (03/04 1301) Pulse Rate:  [69-73] 69 (03/04 1301) Resp:  [17-26] 26 (03/04 1301) BP: (104-115)/(44-53) 115/44 (03/04 1301) SpO2:  [88 %-93 %] 89 % (03/04 1301) Last BM Date: 01/14/18 Filed Weights   01/15/18 0500 01/16/18 0500 01/17/18 0500  Weight: 175 lb 7.8 oz (79.6 kg) 186 lb (84.4 kg) 180 lb (81.6 kg)   General: Frail Heart: RRR. Chest: Crackles throughout.  Some increased work of breathing, dyspnea. Abdomen: Active bowel sounds.  Soft.  Nontender. Rectal: no stool, vault empty.   Extremities: No CCE. Neuro/Psych: Alert.  Oriented x3.  Intake/Output from previous day: 03/03 0701 - 03/04 0700 In: 300 [IV Piggyback:300] Out: 250 [Urine:250]  Intake/Output this shift: No intake/output data recorded.  Lab Results: Recent Labs    01/16/18 0256 01/17/18 0457 01/18/18 0512  WBC 12.6* 13.7* 13.5*  HGB 9.1* 9.7* 9.3*  HCT 27.6* 29.3* 28.5*  PLT 347 361 351   BMET Recent Labs    01/16/18 0256 01/17/18 0457 01/18/18 0512  NA 135 132* 134*  K 3.7 3.9 3.8  CL 97* 97* 99*  CO2  23 25 24   GLUCOSE 139* 133* 130*  BUN 31* 28* 27*  CREATININE 1.35* 1.33* 1.14  CALCIUM 8.0* 7.8* 7.8*   LFT No results for input(s): PROT, ALBUMIN, AST, ALT, ALKPHOS, BILITOT, BILIDIR, IBILI in the last 72 hours. PT/INR No results for input(s): LABPROT, INR in the last 72 hours. Hepatitis Panel No results for input(s): HEPBSAG, HCVAB, HEPAIGM, HEPBIGM in the last 72 hours.  Studies/Results: Dg Chest 2 View  Result Date: 01/18/2018 CLINICAL DATA:  Shortness of breath. History of atrial fibrillation, cancer, heart failure, hypertension, pacemaker, diabetes. Former smoker. EXAM: CHEST  2 VIEW COMPARISON:  Chest x-rays dated 01/16/2018 in 01/14/2018. FINDINGS: Stable cardiomegaly. Left chest wall pacemaker leads appear stable in position. Interstitial and alveolar pulmonary opacities again noted bilaterally, right greater than left, stable compared to the most recent chest x-ray, increased compared to the earlier chest x-ray of 01/14/2018. Small bilateral pleural effusions. No new lung findings. No pneumothorax seen. IMPRESSION: 1. Patchy bilateral interstitial and alveolar opacities, right greater than left, stable compared to most recent chest x-ray of 01/16/2018, most compatible with CHF/pulmonary edema. 2. Small bilateral pleural effusions. 3. Stable cardiomegaly. Electronically Signed   By: Franki Cabot M.D.   On: 01/18/2018 09:08   Scheduled Meds: . dronedarone  400 mg Oral BID WC  . enoxaparin (LOVENOX) injection  30 mg Subcutaneous Q24H  .  feeding supplement  1 Container Oral TID BM  . methylPREDNISolone (SOLU-MEDROL) injection  125 mg Intravenous Daily  . nebivolol  5 mg Oral Daily  . pantoprazole  40 mg Oral Q0600  . polyethylene glycol  17 g Oral BID  . psyllium  1 packet Oral BID  . tamsulosin  0.4 mg Oral Daily   Continuous Infusions: . ampicillin-sulbactam (UNASYN) IV     PRN Meds:.  ASSESMENT:   *   Dysphagia.  Acute on chronic.  Previous EGDs with empiric esophageal  dilatation but has never had esophageal stricture.  He is on a dysphagia 3 diet with thin liquids.  Suspect esophageal dysmotility.    *   Aspiration pneumonia suspected.  Today's x-ray more consistent with CHF/pulmonary edema with pneumonia.  Unasyn in place.    PLAN   *  Added Senokot BID and added 1x dose of MOM.    *  Esophagram reordered for tomrw AM, NPO after mn for this.      Azucena Freed  01/18/2018, 2:01 PM Pager: [phone]   Attending physician's note   I have taken an interval history, reviewed the chart and examined the patient. I agree with the Advanced Practitioner's note, impression and recommendations.   Damaris Hippo, MD [phone] Mon-Fri 8a-5p [phone] after 5p, weekends, holidays

## 2018-01-18 NOTE — Progress Notes (Addendum)
Family Medicine Teaching Service Daily Progress Note Intern Pager: [phone]  Patient name: [name] Medical record number: [id number] Date of birth: November 01, 1920 Age: 84 y.o. Gender: male  Primary Care Provider: Dickie La, MD Consultants: GI, curbside cardiology  Code Status: DNR  Pt Overview and Major Events to Date:  Admitted to [facility] on 01/14/18  Assessment and Plan: [name] a 84 y.o.malepresenting with SOB. PMH is significant forPAF, HTN, T2DM, sinus node dysfunction, CKD III.  Acute on chronic respiratory failure 2/2 CHF exacerbation BNP improving to 421.5 on 3/4 from 854 on admission. Echo with 50-55% EF, G2DD, hypokinesis of inferior and inferoseptal myocardium. Worsening O2 requirement (on RA at [facility]), currently satting at 88% on 5L O2. S/p 3 doses of IV 40mg  lasix. Net neg -3.30L since admission, only -250cc yesterday. Wt decreased to 180lbs on 3/3 from 186lbs on 3/2. at 185 lb from 175 lb.  -40 mg IV lasix, can re-assess whether second dose is needed -continuous pulse ox, supplemental O2 prn -outpatient cardiology follow up -strict I/Os and daily weights -will start solu-medrol   Aspiration PNA  New O2 requirement. Stable leukocytosis to 13.5, from 13.7 on 3/3. Now on dysphagia III diet and eval by GI as noted below. Afebrile. -continuevanc/cefepime (3/2- ) for aspiration PNA -can consider transitioning to mirapenem   Dysphagia in setting of likely aspiration pneumonia  Has had previous EGD showing smooth esophageal narrowing, and h/o multiple esophageal dilations in the past with a few weeks of improved swallowing, but no history of true stricture. GI consulted plan for esophogram on 3/4. -Possible etiology of aspiration pneumonia.  -GI consulted,appreciate recommendations -continue to work with SLP -dysphagia III when not NPO for procedure -DG esophagus pending   Chest Pain - resolved Likely related to dyspnea as above.EKG with paced rhythm  with no ST changes. Well's score of 0 so doubt PE as etiology of chest pain.Troponins trended at 0.03>0.03>0.03.   Atrial fibrillation Chronic, stable. CHADSVasc score of 4 on admission.HASBLED score of 2.Previously on Xarelto for anticoagulationthis was DC'd prior to admitdue to advanced age and propensity to fall.Managed with dronedarone 400mg  tablet bid, and nebivolol 5mg  daily.  -continue dronedarone 400mg  tablet bid, per Cardiology can continue as LVEF >35% -continue nebivolol5mg  daily  Sinus node dysfunction/av block/Pacemaker Patient with prolonged QT/qtc interval on past ekgs.QTc >550 on admission.He does have a pacemaker. -continue dronederone400 mg twice a day>>> relative contraindication in HF, plan to continue per cardiology curbside (see separate note from Dr. Debara Pickett) -continue nebivolol5 mg daily -Avoid QT prolonging medications  Hyponatremia Na of 134, 132 on 3/3. Possibly 2/2 decreased PO intake due to difficulty swallowing.  -can consider fluid repletion with NS  -boost supplementaiton   Hypertension hypotensive with BP 113/53 Home meds: nebivolol. -nebivolol5mg   CKD Stage III Current Cr of 1.14 from 1.28 on admitwith GFR improved at 53 from43.BLCr 1.1-1.3. -daily bmp  Type II Diabetes Patient with past diagnosis of T2DM. Last A1c 5.8 on 08/10/2017.Does not take any oral medication or insulin.Glucose in 130 -monitor CBGon BMP, can add SSI as needed  Sacral ulcer, stage 1 New stage 1 pressure ulcer, likely 2/2 decreased motility.  -will need frequent positional changes -apply foam dressing to relieve pressure -will change mattress to avoid increased pressure   Constipation Reports increased constipation despite daily miralax -soap suds enema  -miralax daily   FEN/GI:Dysphagia 3 diet(NPO prior to esophogram), boost supplementation  Prophylaxis:Lovenox  Disposition: continued inpatient stay   Subjective:  Patient  today  reports improvement in SOB. Reports constipation despite miralax and requests an enema.   Objective: Temp:  [97.9 F (36.6 C)-98.5 F (36.9 C)] 97.9 F (36.6 C) (03/04 0500) Pulse Rate:  [70-73] 73 (03/04 0500) Resp:  [17-19] 19 (03/04 0500) BP: (104-119)/(45-54) 113/53 (03/04 0500) SpO2:  [88 %-93 %] 88 % (03/04 0500) Physical Exam: General: awake and alert, laying in bed, Amasa in place  Cardiovascular: RRR, no MRG Respiratory: diffuse crackles throughout, increased work of breathing Abdomen: soft, non tender, non distended, bowel sounds normal  Extremities: no edema, minimal tenderness bilaterally   Laboratory: Recent Labs  Lab 01/16/18 0256 01/17/18 0457 01/18/18 0512  WBC 12.6* 13.7* 13.5*  HGB 9.1* 9.7* 9.3*  HCT 27.6* 29.3* 28.5*  PLT 347 361 351   Recent Labs  Lab 01/16/18 0256 01/17/18 0457 01/18/18 0512  NA 135 132* 134*  K 3.7 3.9 3.8  CL 97* 97* 99*  CO2 23 25 24   BUN 31* 28* 27*  CREATININE 1.35* 1.33* 1.14  CALCIUM 8.0* 7.8* 7.8*  GLUCOSE 139* 133* 130*    Imaging/Diagnostic Tests: Dg Chest 2 View  Result Date: 01/18/2018 CLINICAL DATA:  Shortness of breath. History of atrial fibrillation, cancer, heart failure, hypertension, pacemaker, diabetes. Former smoker. EXAM: CHEST  2 VIEW COMPARISON:  Chest x-rays dated 01/16/2018 in 01/14/2018. FINDINGS: Stable cardiomegaly. Left chest wall pacemaker leads appear stable in position. Interstitial and alveolar pulmonary opacities again noted bilaterally, right greater than left, stable compared to the most recent chest x-ray, increased compared to the earlier chest x-ray of 01/14/2018. Small bilateral pleural effusions. No new lung findings. No pneumothorax seen. IMPRESSION: 1. Patchy bilateral interstitial and alveolar opacities, right greater than left, stable compared to most recent chest x-ray of 01/16/2018, most compatible with CHF/pulmonary edema. 2. Small bilateral pleural effusions. 3. Stable  cardiomegaly. Electronically Signed   By: Franki Cabot M.D.   On: 01/18/2018 09:08   Dg Chest 2 View  Result Date: 01/16/2018 CLINICAL DATA:  Shortness of breath EXAM: CHEST  2 VIEW COMPARISON:  January 14, 2018 FINDINGS: Worsening bilateral infiltrates, right greater than left, most prominent in the upper lungs. No other changes. IMPRESSION: Worsening bilateral upper lobe predominant infiltrates. Recommend follow-up to resolution. Electronically Signed   By: Dorise Bullion III M.D   On: 01/16/2018 11:24   Dg Chest 2 View  Result Date: 01/14/2018 CLINICAL DATA:  Shortness of breath. EXAM: CHEST  2 VIEW COMPARISON:  Chest x-ray 03/14/2017. FINDINGS: Cardiac pacer with lead tip over the right atrium right ventricle. Cardiomegaly. Diffuse bilateral pulmonary infiltrates are noted consistent with pulmonary edema and/or pneumonia. Pulmonary infiltrates are primarily in the upper lobes. Small bilateral pleural effusions. No acute bony abnormality. IMPRESSION: Cardiac pacer noted stable position. Cardiomegaly. Diffuse bilateral pulmonary infiltrates, primarily in the upper lobes. Findings are consistent pulmonary edema and/or pneumonia. Small bilateral pleural effusions. Electronically Signed   By: Marcello Moores  Register   On: 01/14/2018 07:51     Caroline More, DO 01/18/2018, 12:35 PM PGY-1, [place] Intern pager: [phone], text pages welcome

## 2018-01-19 ENCOUNTER — Inpatient Hospital Stay (HOSPITAL_COMMUNITY): Payer: Medicare Other

## 2018-01-19 DIAGNOSIS — K5900 Constipation, unspecified: Secondary | ICD-10-CM

## 2018-01-19 LAB — CBC
HEMATOCRIT: 31.1 % — AB (ref 39.0–52.0)
HEMOGLOBIN: 10.2 g/dL — AB (ref 13.0–17.0)
MCH: 29.1 pg (ref 26.0–34.0)
MCHC: 32.8 g/dL (ref 30.0–36.0)
MCV: 88.9 fL (ref 78.0–100.0)
Platelets: 348 10*3/uL (ref 150–400)
RBC: 3.5 MIL/uL — ABNORMAL LOW (ref 4.22–5.81)
RDW: 15.4 % (ref 11.5–15.5)
WBC: 13.9 10*3/uL — ABNORMAL HIGH (ref 4.0–10.5)

## 2018-01-19 LAB — BASIC METABOLIC PANEL
ANION GAP: 14 (ref 5–15)
BUN: 31 mg/dL — ABNORMAL HIGH (ref 6–20)
CO2: 24 mmol/L (ref 22–32)
Calcium: 8.2 mg/dL — ABNORMAL LOW (ref 8.9–10.3)
Chloride: 100 mmol/L — ABNORMAL LOW (ref 101–111)
Creatinine, Ser: 1.27 mg/dL — ABNORMAL HIGH (ref 0.61–1.24)
GFR calc Af Amer: 54 mL/min — ABNORMAL LOW (ref >60)
GFR, EST NON AFRICAN AMERICAN: 46 mL/min — AB (ref >60)
GLUCOSE: 151 mg/dL — AB (ref 65–99)
POTASSIUM: 3.8 mmol/L (ref 3.5–5.1)
Sodium: 138 mmol/L (ref 135–145)

## 2018-01-19 MED ORDER — BISACODYL 10 MG RE SUPP
10.0000 mg | Freq: Two times a day (BID) | RECTAL | Status: DC
Start: 2018-01-19 — End: 2018-01-27
  Administered 2018-01-19 – 2018-01-26 (×12): 10 mg via RECTAL
  Filled 2018-01-19 (×14): qty 1

## 2018-01-19 MED ORDER — IOPAMIDOL (ISOVUE-300) INJECTION 61%
50.0000 mL | Freq: Once | INTRAVENOUS | Status: AC | PRN
  Administered 2018-01-19: 5 mL via ORAL

## 2018-01-19 MED ORDER — IOPAMIDOL (ISOVUE-300) INJECTION 61%
INTRAVENOUS | Status: AC
  Administered 2018-01-19: 5 mL via ORAL
  Filled 2018-01-19: qty 50

## 2018-01-19 MED ORDER — FUROSEMIDE 10 MG/ML IJ SOLN
40.0000 mg | Freq: Once | INTRAMUSCULAR | Status: AC
  Administered 2018-01-19: 40 mg via INTRAVENOUS
  Filled 2018-01-19: qty 4

## 2018-01-19 NOTE — Progress Notes (Signed)
PT given suppository per GI and had significant BM will hold off on soaps suds enema for now.

## 2018-01-19 NOTE — Progress Notes (Signed)
CALL PAGER [phone] for any questions or notifications regarding this patient   FMTS Attending Daily Note: [name] MD  Attending [phone]  office [phone]  I  have seen and examined this patient, reviewed their chart. I have discussed this patient with the resident. I agree with the resident's findings, assessment and care plan. Please see the resident's note for complete details.  I have spoken with bunny, his daughter-in-law.  We have discussed his current condition which is at best a plateau.  We are continuing aggressive treatment but unfortunately, he seems to be dwindling.  He does not seem to be in any pain.  They were unable to complete the esophagram study in any great detail today because he became hypoxic.  Stop by to see him, I'm not sure he knew who I was.  I'm not sure he will make it out of this hospitalization.  I have indicated this to the family.

## 2018-01-19 NOTE — Progress Notes (Signed)
SLP Cancellation Note  Patient Details Name: SHREYAN HINZ MRN: [id number] DOB: 1920/06/01   Cancelled treatment:       Reason Eval/Treat Not Completed: (Pt care was being performed, will attempt at next available )   Ronnel Zuercher 01/19/2018, 6:09 PM

## 2018-01-19 NOTE — Progress Notes (Signed)
Family Medicine Teaching Service Daily Progress Note Intern Pager: [phone]  Patient name: [name] Medical record number: [id number] Date of birth: 11/05/1920 Age: 84 y.o. Gender: male  Primary Care Provider: Dickie La, MD Consultants: GI, curbside cardiology  Code Status: DNR  Pt Overview and Major Events to Date:  Admitted to [facility] on 01/14/18  Assessment and Plan: [name] a 84 y.o.malepresenting with SOB. PMH is significant forPAF, HTN, T2DM, sinus node dysfunction, CKD III.  Acute on chronic respiratory failure 2/2 CHF exacerbation + Aspiration PNA BNPimproving to 421.5 on 3/4 from854 on admission. Echo with 50-55% EF, G2DD, hypokinesis of inferior and inferoseptal myocardium. Worsening O2 requirement (on RA at [facility]), currently satting at 892% on 6L O2. S/p IV lasix. Net neg -4.030L since admission, 1L output on 3/4. Wtdecreased to 180lbs on 3/3 from 186lbs on 3/2.CXR from 3/4 showing similar findings as CXR from 3/2.  -Will give 40 mg IV lasix, can re-assess whether second dose is needed  -continuous pulse ox, supplemental O2 prn -outpatientcardiology follow up -strict I/Os and daily weights -continue solu-medrol   Aspiration PNA New O2 requirement.Leukocytosis mildly increased from 13.5 on 01/18/18 to 13.9 today. Now on dysphagia III diet and eval by GI as noted below. Afebrile. -continueunasyn (3/4-)  Dysphagia in setting of likely aspiration pneumonia  Has had previous EGD showing smooth esophageal narrowing, and h/o multiple esophageal dilations in the past with a few weeks of improved swallowing, but no history of true stricture. GI consultedplan for esophogram on 3/5. -Possible etiology of aspiration pneumonia.  -GI consulted,appreciate recommendations -continue to work with SLP -dysphagia III when not NPO for procedure -DG esophagus pending   Chest Pain - resolved Likely related to dyspnea as above.EKG with paced rhythm with no ST  changes. Well's score of 0 so doubt PE as etiology of chest pain.Troponins trended at 0.03>0.03>0.03.   Atrial fibrillation Chronic, stable. CHADSVasc score of 4 on admission.HASBLED score of 2.Previously on Xarelto for anticoagulationthis was DC'd prior to admitdue to advanced age and propensity to fall.Managed with dronedarone 400mg  tablet bid, and nebivolol 5mg  daily.  -continue dronedarone 400mg  tablet bid, per Cardiology can continue as LVEF >35% -continue nebivolol5mg  daily  Sinus node dysfunction/av block/Pacemaker Patient with prolonged QT/qtc interval on past ekgs.QTc >550 on admission.He does have a pacemaker. -continue dronederone400 mg twice a day>>> relative contraindication in HF, plan to continue per cardiology curbside (see separate note from Dr. Debara Pickett) -continue nebivolol5 mg daily -Avoid QT prolonging medications  Hyponatremia-resolved Na of 138, 134 on 3/4. Possibly 2/2 decreased PO intake due to difficulty swallowing.  -can consider fluid repletion with NS -boost supplementaiton   Hypertension Normotensive with BP 131/65, but given age slightly below goal of 140/90.  Home meds: nebivolol. -nebivolol5mg   CKD Stage III Current Cr of 1.27 stablefrom 1.28 on admitwith GFR stable at24from43.BLCr 1.1-1.3. -daily bmp  Type II Diabetes Patient with past diagnosis of T2DM. Last A1c 5.8on 08/10/2017.Does not take any oral medication or insulin.Glucose in 130  Sacral ulcer, stage 1 New stage 1 pressure ulcer, likely 2/2 decreased motility.  -will need frequent positional changes -apply foam dressing to relieve pressure -will change mattress to avoid increased pressure   Constipation  Reports increased constipation despite daily miralax and enema  -soap suds enema  -miralax daily   FEN/GI:Dysphagia 3 diet(NPO prior to esophogram), boost supplementation  Prophylaxis:Lovenox  Disposition: continued inpatient stay    Subjective:  Patient today reports his breathing is fine, despite objective increase in  O2 need to 6L. Reports increased constipation. States his mouth feels very dry.   Objective: Temp:  [96.9 F (36.1 C)-97.5 F (36.4 C)] 97.5 F (36.4 C) (03/05 1204) Pulse Rate:  [68-108] 108 (03/05 1204) Resp:  [17-22] 19 (03/05 1204) BP: (118-131)/(46-72) 118/60 (03/05 1204) SpO2:  [92 %-96 %] 92 % (03/05 1204) FiO2 (%):  [50 %] 50 % (03/05 0028) Weight:  [180 lb (81.6 kg)] 180 lb (81.6 kg) (03/05 0500) Physical Exam: General: awake and alert, laying in bed, [place] in place  Cardiovascular: RRR, no MRG  Respiratory: distant breath sounds, slight crackles at bases, no increased work of breathing, speaking in full sentences  Abdomen: soft, non tender Extremities: no edema, non tender   Laboratory: Recent Labs  Lab 01/17/18 0457 01/18/18 0512 01/19/18 0935  WBC 13.7* 13.5* 13.9*  HGB 9.7* 9.3* 10.2*  HCT 29.3* 28.5* 31.1*  PLT 361 351 348   Recent Labs  Lab 01/17/18 0457 01/18/18 0512 01/19/18 0935  NA 132* 134* 138  K 3.9 3.8 3.8  CL 97* 99* 100*  CO2 25 24 24   BUN 28* 27* 31*  CREATININE 1.33* 1.14 1.27*  CALCIUM 7.8* 7.8* 8.2*  GLUCOSE 133* 130* 151*    Imaging/Diagnostic Tests: Dg Chest 2 View  Result Date: 01/18/2018 CLINICAL DATA:  Shortness of breath. History of atrial fibrillation, cancer, heart failure, hypertension, pacemaker, diabetes. Former smoker. EXAM: CHEST  2 VIEW COMPARISON:  Chest x-rays dated 01/16/2018 in 01/14/2018. FINDINGS: Stable cardiomegaly. Left chest wall pacemaker leads appear stable in position. Interstitial and alveolar pulmonary opacities again noted bilaterally, right greater than left, stable compared to the most recent chest x-ray, increased compared to the earlier chest x-ray of 01/14/2018. Small bilateral pleural effusions. No new lung findings. No pneumothorax seen. IMPRESSION: 1. Patchy bilateral interstitial and alveolar opacities, right  greater than left, stable compared to most recent chest x-ray of 01/16/2018, most compatible with CHF/pulmonary edema. 2. Small bilateral pleural effusions. 3. Stable cardiomegaly. Electronically Signed   By: Franki Cabot M.D.   On: 01/18/2018 09:08   Dg Chest 2 View  Result Date: 01/16/2018 CLINICAL DATA:  Shortness of breath EXAM: CHEST  2 VIEW COMPARISON:  January 14, 2018 FINDINGS: Worsening bilateral infiltrates, right greater than left, most prominent in the upper lungs. No other changes. IMPRESSION: Worsening bilateral upper lobe predominant infiltrates. Recommend follow-up to resolution. Electronically Signed   By: Dorise Bullion III M.D   On: 01/16/2018 11:24   Dg Chest 2 View  Result Date: 01/14/2018 CLINICAL DATA:  Shortness of breath. EXAM: CHEST  2 VIEW COMPARISON:  Chest x-ray 03/14/2017. FINDINGS: Cardiac pacer with lead tip over the right atrium right ventricle. Cardiomegaly. Diffuse bilateral pulmonary infiltrates are noted consistent with pulmonary edema and/or pneumonia. Pulmonary infiltrates are primarily in the upper lobes. Small bilateral pleural effusions. No acute bony abnormality. IMPRESSION: Cardiac pacer noted stable position. Cardiomegaly. Diffuse bilateral pulmonary infiltrates, primarily in the upper lobes. Findings are consistent pulmonary edema and/or pneumonia. Small bilateral pleural effusions. Electronically Signed   By: Marcello Moores  Register   On: 01/14/2018 07:51   Dg Esophagus W/water Raelene Bott Cm  Result Date: 01/19/2018 CLINICAL DATA:  Concern for aspiration. History of GERD. History of esophageal strictures with multiple dilatations. EXAM: ESOPHOGRAM/BARIUM SWALLOW TECHNIQUE: Single contrast examination was performed using isotopic water-soluble contrast. FLUOROSCOPY TIME:  Fluoroscopy Time:  24 seconds Radiation Exposure Index (if provided by the fluoroscopic device): 6.8 mGy Number of Acquired Spot Images: 0 COMPARISON:  None. FINDINGS: Extensive  airspace disease by chest  radiography yesterday. The patient would become notably hypoxic when recumbent (70s O2). Patient could not stand safely for this study and had to take swallows sitting upright and in the positioned on his back for imaging. This was a markedly suboptimal exam. There is relative smooth narrowing at the lower esophagus that did not cause stasis of thin liquid. It is unclear if this is due to a stricture or under distention by small swallows. Motility was unremarkable for age. No aspiration was seen. IMPRESSION: 1. Very limited study due to orthopnea with prominent hypoxia when recumbent. 2. Only the most severe stricture would be detected on this study. There was no stasis/delayed passage of thin liquid. Electronically Signed   By: Monte Fantasia M.D.   On: 01/19/2018 14:11     Caroline More, DO 01/19/2018, 2:46 PM PGY-1, [place] Intern pager: [phone], text pages welcome

## 2018-01-19 NOTE — Progress Notes (Addendum)
Daily Rounding Note  01/19/2018, 2:17 PM  LOS: 5 days   SUBJECTIVE:   Chief complaint: Hypoxia and orthopnea during esophagram this afternoon.  Now on venturin mask, got 40 IV Lasix for pulmonary edema.      Not a lot of BM after laxatives and SS enema yesterday.    OBJECTIVE:         Vital signs in last 24 hours:    Temp:  [96.9 F (36.1 C)-97.5 F (36.4 C)] 97.5 F (36.4 C) (03/05 1204) Pulse Rate:  [68-108] 108 (03/05 1204) Resp:  [17-22] 19 (03/05 1204) BP: (118-131)/(46-72) 118/60 (03/05 1204) SpO2:  [92 %-96 %] 92 % (03/05 1204) FiO2 (%):  [50 %] 50 % (03/05 0028) Weight:  [180 lb (81.6 kg)] 180 lb (81.6 kg) (03/05 0500) Last BM Date: 01/14/18 Filed Weights   01/16/18 0500 01/17/18 0500 01/19/18 0500  Weight: 186 lb (84.4 kg) 180 lb (81.6 kg) 180 lb (81.6 kg)   General: looks frail   Heart: mildly tachy in low 100s Chest: no adventitious sounds, breathing not labored on Venturi mask.   Abdomen: soft, NT, ND.  BS hypoactive.   Extremities: no CCE Neuro/Psych:  Appropriate, alert, following commands and moving all limbs.    Intake/Output from previous day: 03/04 0701 - 03/05 0700 In: 300 [IV Piggyback:300] Out: 1000 [Urine:1000]  Intake/Output this shift: No intake/output data recorded.  Lab Results: Recent Labs    01/17/18 0457 01/18/18 0512 01/19/18 0935  WBC 13.7* 13.5* 13.9*  HGB 9.7* 9.3* 10.2*  HCT 29.3* 28.5* 31.1*  PLT 361 351 348   BMET Recent Labs    01/17/18 0457 01/18/18 0512 01/19/18 0935  NA 132* 134* 138  K 3.9 3.8 3.8  CL 97* 99* 100*  CO2 25 24 24   GLUCOSE 133* 130* 151*  BUN 28* 27* 31*  CREATININE 1.33* 1.14 1.27*  CALCIUM 7.8* 7.8* 8.2*    Studies/Results: Dg Chest 2 View  Result Date: 01/18/2018 CLINICAL DATA:  Shortness of breath. History of atrial fibrillation, cancer, heart failure, hypertension, pacemaker, diabetes. Former smoker. EXAM: CHEST  2 VIEW  COMPARISON:  Chest x-rays dated 01/16/2018 in 01/14/2018. FINDINGS: Stable cardiomegaly. Left chest wall pacemaker leads appear stable in position. Interstitial and alveolar pulmonary opacities again noted bilaterally, right greater than left, stable compared to the most recent chest x-ray, increased compared to the earlier chest x-ray of 01/14/2018. Small bilateral pleural effusions. No new lung findings. No pneumothorax seen. IMPRESSION: 1. Patchy bilateral interstitial and alveolar opacities, right greater than left, stable compared to most recent chest x-ray of 01/16/2018, most compatible with CHF/pulmonary edema. 2. Small bilateral pleural effusions. 3. Stable cardiomegaly. Electronically Signed   By: Franki Cabot M.D.   On: 01/18/2018 09:08   Dg Esophagus W/water Sol Cm  Result Date: 01/19/2018 CLINICAL DATA:  Concern for aspiration. History of GERD. History of esophageal strictures with multiple dilatations. EXAM: ESOPHOGRAM/BARIUM SWALLOW TECHNIQUE: Single contrast examination was performed using isotopic water-soluble contrast. FLUOROSCOPY TIME:  Fluoroscopy Time:  24 seconds Radiation Exposure Index (if provided by the fluoroscopic device): 6.8 mGy Number of Acquired Spot Images: 0 COMPARISON:  None. FINDINGS: Extensive airspace disease by chest radiography yesterday. The patient would become notably hypoxic when recumbent (70s O2). Patient could not stand safely for this study and had to take swallows sitting upright and in the positioned on his back for imaging. This was a markedly suboptimal exam. There is relative smooth narrowing  at the lower esophagus that did not cause stasis of thin liquid. It is unclear if this is due to a stricture or under distention by small swallows. Motility was unremarkable for age. No aspiration was seen. IMPRESSION: 1. Very limited study due to orthopnea with prominent hypoxia when recumbent. 2. Only the most severe stricture would be detected on this study. There was  no stasis/delayed passage of thin liquid. Electronically Signed   By: Monte Fantasia M.D.   On: 01/19/2018 14:11   Scheduled Meds: . dronedarone  400 mg Oral BID WC  . enoxaparin (LOVENOX) injection  30 mg Subcutaneous Q24H  . feeding supplement  1 Container Oral TID BM  . iopamidol      . methylPREDNISolone (SOLU-MEDROL) injection  125 mg Intravenous Daily  . nebivolol  5 mg Oral Daily  . pantoprazole  40 mg Oral Q0600  . polyethylene glycol  17 g Oral BID  . psyllium  1 packet Oral BID  . senna-docusate  1 tablet Oral BID  . tamsulosin  0.4 mg Oral Daily   Continuous Infusions: . ampicillin-sulbactam (UNASYN) IV 3 g (01/19/18 1407)   PRN Meds:.   ASSESMENT:   *   Dysphagia.  Acute on chronic.  Previous EGDs with empiric esophageal dilatation but has never had esophageal stricture.  He is on a dysphagia 3 diet with thin liquids.  Suspect esophageal dysmotility.  Poor quality esophagram, did not provide much information.    *   Aspiration pneumonia suspected.  01/18/18 x-ray more consistent with CHF/pulmonary edema with pneumonia.  Unasyn in place.   *  Constipation.  Family member sites previous issues with obstipation.     PLAN   *  No plans for EGD or esophageal dilatation at present, currently his resp status would not allow for sedation and ? Benefit of esophageal dilatation, has not helped much in past.    *  Add Bisacodyl PR BID.  Stopped Metamucil as it could backfire on pt without reliable po fluid intake.      Azucena Freed  01/19/2018, 2:17 PM Pager: [phone]   Attending physician's note   I have taken an interval history, reviewed the chart and examined the patient. I agree with the Advanced Practitioner's note, impression and recommendations.   Damaris Hippo, MD [phone] Mon-Fri 8a-5p [phone] after 5p, weekends, holidays

## 2018-01-19 NOTE — Progress Notes (Signed)
Pt returned to floor with SWOT nurse from radiology with oxygen dropping into low 80's on  6L Nasal canula pt reported no SOB or distress noted 40 mgIV lasix given per MD order  And pt placed on venturi mask at 55% MD notified will continue to monitor

## 2018-01-20 ENCOUNTER — Telehealth: Payer: Self-pay | Admitting: Cardiology

## 2018-01-20 ENCOUNTER — Encounter: Payer: Medicare Other | Admitting: *Deleted

## 2018-01-20 ENCOUNTER — Encounter: Payer: Medicare Other | Admitting: Cardiovascular Disease

## 2018-01-20 LAB — CBC
HEMATOCRIT: 30 % — AB (ref 39.0–52.0)
Hemoglobin: 9.7 g/dL — ABNORMAL LOW (ref 13.0–17.0)
MCH: 28.4 pg (ref 26.0–34.0)
MCHC: 32.3 g/dL (ref 30.0–36.0)
MCV: 88 fL (ref 78.0–100.0)
Platelets: 356 10*3/uL (ref 150–400)
RBC: 3.41 MIL/uL — ABNORMAL LOW (ref 4.22–5.81)
RDW: 15 % (ref 11.5–15.5)
WBC: 18.1 10*3/uL — AB (ref 4.0–10.5)

## 2018-01-20 LAB — STREP PNEUMONIAE URINARY ANTIGEN: STREP PNEUMO URINARY ANTIGEN: NEGATIVE

## 2018-01-20 LAB — BASIC METABOLIC PANEL
ANION GAP: 12 (ref 5–15)
BUN: 40 mg/dL — AB (ref 6–20)
CO2: 27 mmol/L (ref 22–32)
Calcium: 8 mg/dL — ABNORMAL LOW (ref 8.9–10.3)
Chloride: 100 mmol/L — ABNORMAL LOW (ref 101–111)
Creatinine, Ser: 1.33 mg/dL — ABNORMAL HIGH (ref 0.61–1.24)
GFR calc Af Amer: 51 mL/min — ABNORMAL LOW (ref >60)
GFR, EST NON AFRICAN AMERICAN: 44 mL/min — AB (ref >60)
Glucose, Bld: 159 mg/dL — ABNORMAL HIGH (ref 65–99)
Potassium: 3.5 mmol/L (ref 3.5–5.1)
SODIUM: 139 mmol/L (ref 135–145)

## 2018-01-20 MED ORDER — MORPHINE SULFATE (PF) 2 MG/ML IV SOLN: 0.5000 mg | INTRAVENOUS | Status: DC | PRN

## 2018-01-20 MED ORDER — METHYLPREDNISOLONE SODIUM SUCC 125 MG IJ SOLR
125.0000 mg | Freq: Four times a day (QID) | INTRAMUSCULAR | Status: DC
  Administered 2018-01-20 – 2018-01-25 (×21): 125 mg via INTRAVENOUS
  Filled 2018-01-20 (×21): qty 2

## 2018-01-20 MED ORDER — FUROSEMIDE 10 MG/ML IJ SOLN
40.0000 mg | Freq: Once | INTRAMUSCULAR | Status: AC
  Administered 2018-01-20: 40 mg via INTRAVENOUS
  Filled 2018-01-20: qty 4

## 2018-01-20 NOTE — Progress Notes (Signed)
CALL PAGER [phone] for any questions or notifications regarding this patient  FMTS Attending Note: [name] MD Status worsening overnight. I have spoken with [name] (daughhter in law) who is on her way to hospital. I will meet with her there around 9 AM. For now will institute small dose morphine for air hunger, increased  Oxygen demand, increased respiratory rate.

## 2018-01-20 NOTE — Progress Notes (Addendum)
Family Medicine Teaching Service Daily Progress Note Intern Pager: [phone]  Patient name: [name] Medical record number: [id number] Date of birth: 12/13/1919 Age: 84 y.o. Gender: male  Primary Care Provider: Dickie La, MD Consultants: GI, curbside cardiology  Code Status: DNR  Pt Overview and Major Events to Date:  Admitted to [facility] on 01/14/18  Assessment and Plan: [name] a 84 y.o.malepresenting with SOB. PMH is significant forPAF, HTN, T2DM, sinus node dysfunction, CKD III.  Acute on chronic respiratory failure 2/2 CHF exacerbation + Aspiration PNA BNPimproving to 421.5on 3/41from854 on admission. Echo with 50-55% EF, G2DD, hypokinesis of inferior and inferoseptal myocardium.WorseningO2 requirement (on RA at [facility]), currently satting at91% on15L via venturi mask. S/p IV lasix. Net neg -4.535L since admission, 825L output on 3/5. Wtstable at 181lbs.CXR from 3/4 showing similar findings as CXR from 3/2. Leukocytosis increased to 18.1 from 13.9 on 3/5. Now on dysphagia III diet and eval by GI as noted below. Afebrile. -Will give 40 mg IV lasix, can re-assess whether second dose is needed  -supplemental O2 -outpatientcardiology follow up -strict I/Os and daily weights -continue solu-medrol125 mg q6hrs -continue unasyn (3/4- ) -will give morphine q2h prn for respiratory distress  Dysphagia in setting of likely aspiration pneumonia  Has had previous EGD showing smooth esophageal narrowing, and h/o multiple esophageal dilations in the past with a few weeks of improved swallowing, but no history of true stricture. Esophogram limited due to orthopnea and hypoxia if patient was laying flat.  -Likely etiology of aspiration pneumonia.  -GI consulted,appreciate recommendations -continue to work with SLP -dysphagia III diet  Chest Pain - resolved Likely related to dyspnea as above.EKG with paced rhythm with no ST changes. Well's score of 0 so doubt PE as  etiology of chest pain.Troponins trended at 0.03>0.03>0.03.   Atrial fibrillation Chronic, stable. CHADSVasc score of 4 on admission.HASBLED score of 2.Previously on Xarelto for anticoagulationthis was DC'd prior to admitdue to advanced age and propensity to fall.Managed with dronedarone 400mg  tablet bid, and nebivolol 5mg  daily.  -continue dronedarone 400mg  tablet bid, per Cardiology can continue as LVEF >35% -continue nebivolol5mg  daily  Sinus node dysfunction/av block/Pacemaker Patient with prolonged QT/qtc interval on past ekgs.QTc >550 on admission.He does have a pacemaker. -continue dronederone400 mg twice a day>>> relative contraindication in HF, plan to continue per cardiology curbside (see separate note from Dr. Debara Pickett) -continue nebivolol5 mg daily -Avoid QT prolonging medications  Hyponatremia-resolved Na of 139, likely 2/2 decreased PO intake due to difficulty swallowing.  -boost supplementaiton  Hypertension Hypotensive with BP113/62.  Home meds: nebivolol. -nebivolol5mg   CKD Stage III Current Cr of 1.33 with GFR stable at44.BLCr 1.1-1.3. -daily bmp  Type II Diabetes Patient with past diagnosis of T2DM. Last A1c 5.8on 08/10/2017.Does not take any oral medication or insulin.Glucose in130  Sacral ulcer, stage 1 Stable. New stage 1 pressure ulcer, likely 2/2 decreased motility.  -will need frequent positional changes -apply foam dressing to relieve pressure -will change mattress to avoid increased pressure  Constipation  Per nursing, patient had large BM following suppository ordered by GI -soap suds enema prn constipation  -miralax daily  FEN/GI:Dysphagia 3 diet(NPO prior to esophogram), boost supplementation Prophylaxis:Lovenox  Disposition: continued inpatient stay   Subjective:  Patient states his breathing is doing well. No pain. Reports straps from venturi mask bothers his ears.   Objective: Temp:  [97.5 F  (36.4 C)-97.7 F (36.5 C)] 97.7 F (36.5 C) (03/06 0800) Pulse Rate:  [68-121] 121 (03/06 0417) Resp:  [  13-23] 15 (03/06 0417) BP: (100-144)/(57-107) 113/62 (03/06 0417) SpO2:  [89 %-95 %] 91 % (03/06 0417) FiO2 (%):  [50 %-55 %] 50 % (03/06 0018) Weight:  [181 lb (82.1 kg)] 181 lb (82.1 kg) (03/06 0500) Physical Exam: General: awake and alert, sitting up in bed, venturi mask in place  Cardiovascular: RRR, no MRG Respiratory: course crackles throughout, no increased work of breathing, speaking full sentences  Abdomen: soft, non tender, non distended, bowel sounds normal  Extremities: no edema, non tender   Laboratory: Recent Labs  Lab 01/18/18 0512 01/19/18 0935 01/20/18 0439  WBC 13.5* 13.9* 18.1*  HGB 9.3* 10.2* 9.7*  HCT 28.5* 31.1* 30.0*  PLT 351 348 356   Recent Labs  Lab 01/18/18 0512 01/19/18 0935 01/20/18 0439  NA 134* 138 139  K 3.8 3.8 3.5  CL 99* 100* 100*  CO2 24 24 27   BUN 27* 31* 40*  CREATININE 1.14 1.27* 1.33*  CALCIUM 7.8* 8.2* 8.0*  GLUCOSE 130* 151* 159*     Imaging/Diagnostic Tests: Dg Chest 2 View  Result Date: 01/18/2018 CLINICAL DATA:  Shortness of breath. History of atrial fibrillation, cancer, heart failure, hypertension, pacemaker, diabetes. Former smoker. EXAM: CHEST  2 VIEW COMPARISON:  Chest x-rays dated 01/16/2018 in 01/14/2018. FINDINGS: Stable cardiomegaly. Left chest wall pacemaker leads appear stable in position. Interstitial and alveolar pulmonary opacities again noted bilaterally, right greater than left, stable compared to the most recent chest x-ray, increased compared to the earlier chest x-ray of 01/14/2018. Small bilateral pleural effusions. No new lung findings. No pneumothorax seen. IMPRESSION: 1. Patchy bilateral interstitial and alveolar opacities, right greater than left, stable compared to most recent chest x-ray of 01/16/2018, most compatible with CHF/pulmonary edema. 2. Small bilateral pleural effusions. 3. Stable  cardiomegaly. Electronically Signed   By: Franki Cabot M.D.   On: 01/18/2018 09:08   Dg Chest 2 View  Result Date: 01/16/2018 CLINICAL DATA:  Shortness of breath EXAM: CHEST  2 VIEW COMPARISON:  January 14, 2018 FINDINGS: Worsening bilateral infiltrates, right greater than left, most prominent in the upper lungs. No other changes. IMPRESSION: Worsening bilateral upper lobe predominant infiltrates. Recommend follow-up to resolution. Electronically Signed   By: Dorise Bullion III M.D   On: 01/16/2018 11:24   Dg Chest 2 View  Result Date: 01/14/2018 CLINICAL DATA:  Shortness of breath. EXAM: CHEST  2 VIEW COMPARISON:  Chest x-ray 03/14/2017. FINDINGS: Cardiac pacer with lead tip over the right atrium right ventricle. Cardiomegaly. Diffuse bilateral pulmonary infiltrates are noted consistent with pulmonary edema and/or pneumonia. Pulmonary infiltrates are primarily in the upper lobes. Small bilateral pleural effusions. No acute bony abnormality. IMPRESSION: Cardiac pacer noted stable position. Cardiomegaly. Diffuse bilateral pulmonary infiltrates, primarily in the upper lobes. Findings are consistent pulmonary edema and/or pneumonia. Small bilateral pleural effusions. Electronically Signed   By: Marcello Moores  Register   On: 01/14/2018 07:51   Dg Esophagus W/water Raelene Bott Cm  Result Date: 01/19/2018 CLINICAL DATA:  Concern for aspiration. History of GERD. History of esophageal strictures with multiple dilatations. EXAM: ESOPHOGRAM/BARIUM SWALLOW TECHNIQUE: Single contrast examination was performed using isotopic water-soluble contrast. FLUOROSCOPY TIME:  Fluoroscopy Time:  24 seconds Radiation Exposure Index (if provided by the fluoroscopic device): 6.8 mGy Number of Acquired Spot Images: 0 COMPARISON:  None. FINDINGS: Extensive airspace disease by chest radiography yesterday. The patient would become notably hypoxic when recumbent (70s O2). Patient could not stand safely for this study and had to take swallows  sitting upright and in the positioned  on his back for imaging. This was a markedly suboptimal exam. There is relative smooth narrowing at the lower esophagus that did not cause stasis of thin liquid. It is unclear if this is due to a stricture or under distention by small swallows. Motility was unremarkable for age. No aspiration was seen. IMPRESSION: 1. Very limited study due to orthopnea with prominent hypoxia when recumbent. 2. Only the most severe stricture would be detected on this study. There was no stasis/delayed passage of thin liquid. Electronically Signed   By: Monte Fantasia M.D.   On: 01/19/2018 14:11     Caroline More, DO 01/20/2018, 9:31 AM PGY-1, [place] Intern pager: [phone], text pages welcome

## 2018-01-20 NOTE — Telephone Encounter (Signed)
Confirmed remote transmission w/ pt family member.   

## 2018-01-20 NOTE — Progress Notes (Signed)
Physical Therapy Treatment Patient Details Name: [name] MRN: [id number] DOB: 26-Sep-1920 Today's Date: 01/20/2018    History of Present Illness Pt is a 84 y.o. male presenting with SOB. CXR shows diffuse bilateral infiltrates, primarily in the upper lobes, consistent with pulmonary edema and/or PNA.  PMH is significant for PAF, HTN, T2DM, sinus node dysfunction, CKD III. He has a h/o esophageal dysphagia, including GERD, dysmotility, and strictures requiring multiple dilations.    PT Comments    Pt performed multiple transfers during session but remains limited due to hypoxia.  Pt required 15L/60% venti mask and post transfers, he required non rebreather to improve O2 sats.  Plan for SNF remains appropriate.  Plan next session to progress activity to tolerance based on respiratory function.    Follow Up Recommendations  SNF([facility] if possible so he can transition back to [facility] )     Equipment Recommendations  None recommended by PT    Recommendations for Other Services       Precautions / Restrictions Precautions Precautions: Fall Restrictions Weight Bearing Restrictions: No    Mobility  Bed Mobility Overal bed mobility: Needs Assistance Bed Mobility: Supine to Sit     Supine to sit: Mod assist     General bed mobility comments: Mod assist to elevate trunk into sitting edge of bed.  Required increased time and O2 sats dropped to 83% on 10 L venti mask at 40% FiO2.  INcreased to 15 L at 60% to recover in sitting to greater than 90%.    Transfers Overall transfer level: Needs assistance Equipment used: Rolling walker (2 wheeled) Transfers: Sit to/from Stand Sit to Stand: Min assist         General transfer comment: Cues for hand placement to and from seated surface.  After transfer from bed to [facility] patient desaturated to 83% on 15L/60% venti mask, and recovered in sitting.  After toileting patient stood for pericare and transfer from [facility] to recliner, decreased  to 56%, required non rebreather in sitting to recover to 98%.  RN aware and present during 2nd transfer.    Ambulation/Gait Ambulation/Gait assistance: Min assist Ambulation Distance (Feet): (steps from bed to chair is all he could tolerate due to hypoxia.  ) Assistive device: Rolling walker (2 wheeled) Gait Pattern/deviations: Shuffle;Step-to pattern;Decreased step length - right;Decreased step length - left;Trunk flexed     General Gait Details: minA for steadying with RW, pt with 4/4 DoE with ambulation to recliner.     Stairs            Wheelchair Mobility    Modified Rankin (Stroke Patients Only)       Balance Overall balance assessment: Needs assistance   Sitting balance-Leahy Scale: Fair       Standing balance-Leahy Scale: Poor Standing balance comment: requires UE support                            Cognition Arousal/Alertness: Awake/alert Behavior During Therapy: WFL for tasks assessed/performed Overall Cognitive Status: Within Functional Limits for tasks assessed                                        Exercises      General Comments        Pertinent Vitals/Pain Pain Assessment: No/denies pain    Home Living  Prior Function            PT Goals (current goals can now be found in the care plan section) Acute Rehab PT Goals Potential to Achieve Goals: Fair Progress towards PT goals: Progressing toward goals    Frequency    Min 2X/week      PT Plan Current plan remains appropriate    Co-evaluation              AM-PAC PT "6 Clicks" Daily Activity  Outcome Measure  Difficulty turning over in bed (including adjusting bedclothes, sheets and blankets)?: Unable Difficulty moving from lying on back to sitting on the side of the bed? : Unable Difficulty sitting down on and standing up from a chair with arms (e.g., wheelchair, bedside commode, etc,.)?: Unable Help needed moving  to and from a bed to chair (including a wheelchair)?: A Little Help needed walking in hospital room?: A Little Help needed climbing 3-5 steps with a railing? : A Lot 6 Click Score: 11    End of Session Equipment Utilized During Treatment: Gait belt Activity Tolerance: Patient tolerated treatment well;Patient limited by fatigue Patient left: in chair;with call bell/phone within reach;with chair alarm set Nurse Communication: Mobility status PT Visit Diagnosis: Unsteadiness on feet (R26.81);Other abnormalities of gait and mobility (R26.89);Muscle weakness (generalized) (M62.81);Apraxia (R48.2);Difficulty in walking, not elsewhere classified (R26.2)     Time: 6195-0932 PT Time Calculation (min) (ACUTE ONLY): 32 min  Charges:  $Therapeutic Activity: 23-37 mins                    G CodesGovernor Rooks, PTA pager [phone]    Cristela Blue 01/20/2018, 4:36 PM

## 2018-01-20 NOTE — Progress Notes (Signed)
  Speech Language Pathology Treatment: Dysphagia  Patient Details Name: [name] MRN: [id number] DOB: 1920-09-29 Today's Date: 01/20/2018 Time: 4163-8453 SLP Time Calculation (min) (ACUTE ONLY): 13 min  Assessment / Plan / Recommendation Clinical Impression  Pt seen with thin liquid and Dys 1 PO trials. Pt demonstrated independent ability to use small, controlled thin liquid sips from straw without overt s/s concerning for aspiration. Despite slightly increased manipulation time, pt was sufficiently able to clear oral cavity independently with puree trials. Pt reported slight globus sensation at end of session, benefiting from [place] verbal cues to alternate liquids and solids throughout meal. Further, pt said he couldn't eat harder foods (roll) and preferred softer foods (mashed potatoes, applesauce). Therefore, recommend downgrading to Dys 2 (chopped) and thin liquid diet to aid pt's comfort, and conserve energy to consume meals. Continue aspiration and esophageal precautions, and now allowing straws. SLP will follow up to ensure pt's safety and compliance with precautions.     HPI HPI: Pt is a 84 y.o.malepresenting with SOB. CXR shows diffuse bilateral infiltrates, primarily in the upper lobes, consistent with pulmonary edema and/or PNA.  PMH is significant forPAF, HTN, T2DM, sinus node dysfunction, CKD III. He has a h/o esophageal dysphagia, including GERD, dysmotility, and strictures requiring multiple dilations. Pt did have MBS completed in October 2015 that showed normal oropharyngeal function for his age.      SLP Plan  Continue with current plan of care       Recommendations  Diet recommendations: Dysphagia 2 (fine chop);Thin liquid Liquids provided via: Cup;Straw Medication Administration: Crushed with puree Supervision: Patient able to self feed;Intermittent supervision to cue for compensatory strategies Compensations: Slow rate;Small sips/bites;Follow solids with  liquid Postural Changes and/or Swallow Maneuvers: Seated upright 90 degrees;Upright 30-60 min after meal                Oral Care Recommendations: Oral care BID Follow up Recommendations: None SLP Visit Diagnosis: Dysphagia, unspecified (R13.10) Plan: Continue with current plan of care       GO              [place] Hafsa Lohn SLP Student Clinician   [place] Shanieka Blea 01/20/2018, 4:52 PM

## 2018-01-20 NOTE — Progress Notes (Signed)
FPTS Interim Progress Note:  Patient clinically worsening despite antibiotics. After long discussion with patient and daughter in law by Dr. Nori Riis, we will make the following changes to his plan of care:  -he is NOT full comfort care at present -morphine 0.5mg  q2 PRN for respiratory distress -lasix 40mg  IV once -DISCONTINUE all pulse ox and tele. Please check temp and BP q8hrs. We will not change his plan of care for worsening O2 sats. -will not stop antibiotics at present, family may consider this later today  RN team, please call if need to increase morphine doses.   Ralene Ok, MD 01/20/18

## 2018-01-20 NOTE — Progress Notes (Signed)
FPTS Interim Progress Note  S:Went by to see patient. Patient appears comfortable in bed in NAD. States he does not feel SOB and that he feels breathing is improved. States he felt better after lasix and morphine. Per daughter in law patient looks better this afternoon.   O: BP 101/70 (BP Location: Left Arm)   Pulse 71   Temp 97.9 F (36.6 C) (Axillary)   Resp 16   Ht 5\' 6"  (1.676 m)   Wt 181 lb (82.1 kg)   SpO2 96%   BMI 29.21 kg/m   Resp: slight expiratory wheezing throughout, crackles at bases but improved since exam this morning, no increased work of breathing   A/P: Acute on chronic respiratory failure 2/2 CHF exacerbation+ Aspiration PNA O2 sat at 96% on 15L via venturi mask. Clinically improved since this morning.  -supplemental O2 -continuesolu-medrol125 mg q6hrs -continue unasyn (3/4- ) -will give morphine q2h prn for respiratory distress  -transition from stepdown to med surg level    Caroline More, DO 01/20/2018, 3:04 PM PGY-1, [place] [facility] pager [phone]

## 2018-01-21 ENCOUNTER — Ambulatory Visit: Payer: Medicare Other | Admitting: Gastroenterology

## 2018-01-21 ENCOUNTER — Encounter: Payer: Self-pay | Admitting: Cardiology

## 2018-01-21 LAB — CBC
HCT: 29.7 % — ABNORMAL LOW (ref 39.0–52.0)
Hemoglobin: 9.3 g/dL — ABNORMAL LOW (ref 13.0–17.0)
MCH: 27.8 pg (ref 26.0–34.0)
MCHC: 31.3 g/dL (ref 30.0–36.0)
MCV: 88.9 fL (ref 78.0–100.0)
Platelets: 323 10*3/uL (ref 150–400)
RBC: 3.34 MIL/uL — AB (ref 4.22–5.81)
RDW: 15.3 % (ref 11.5–15.5)
WBC: 14.2 10*3/uL — AB (ref 4.0–10.5)

## 2018-01-21 LAB — BASIC METABOLIC PANEL
Anion gap: 14 (ref 5–15)
BUN: 44 mg/dL — AB (ref 6–20)
CALCIUM: 7.9 mg/dL — AB (ref 8.9–10.3)
CO2: 27 mmol/L (ref 22–32)
Chloride: 99 mmol/L — ABNORMAL LOW (ref 101–111)
Creatinine, Ser: 1.48 mg/dL — ABNORMAL HIGH (ref 0.61–1.24)
GFR calc Af Amer: 45 mL/min — ABNORMAL LOW (ref >60)
GFR calc non Af Amer: 38 mL/min — ABNORMAL LOW (ref >60)
GLUCOSE: 263 mg/dL — AB (ref 65–99)
Potassium: 3.1 mmol/L — ABNORMAL LOW (ref 3.5–5.1)
SODIUM: 140 mmol/L (ref 135–145)

## 2018-01-21 LAB — LEGIONELLA PNEUMOPHILA SEROGP 1 UR AG
L. pneumophila Serogp 1 Ur Ag: NEGATIVE
L. pneumophila Serogp 1 Ur Ag: NEGATIVE

## 2018-01-21 LAB — PROCALCITONIN: Procalcitonin: 0.1 ng/mL

## 2018-01-21 LAB — MAGNESIUM: MAGNESIUM: 2.5 mg/dL — AB (ref 1.7–2.4)

## 2018-01-21 MED ORDER — FUROSEMIDE 10 MG/ML IJ SOLN
40.0000 mg | Freq: Every day | INTRAMUSCULAR | Status: DC
  Filled 2018-01-21: qty 4

## 2018-01-21 MED ORDER — FUROSEMIDE 10 MG/ML IJ SOLN: 40.0000 mg | Freq: Every day | INTRAMUSCULAR | Status: DC

## 2018-01-21 MED ORDER — FUROSEMIDE 10 MG/ML IJ SOLN
40.0000 mg | Freq: Once | INTRAMUSCULAR | Status: AC
  Administered 2018-01-21: 40 mg via INTRAVENOUS

## 2018-01-21 MED ORDER — VANCOMYCIN HCL IN DEXTROSE 1-5 GM/200ML-% IV SOLN
1000.0000 mg | INTRAVENOUS | Status: DC
  Filled 2018-01-21: qty 200

## 2018-01-21 MED ORDER — POTASSIUM CHLORIDE CRYS ER 20 MEQ PO TBCR
40.0000 meq | EXTENDED_RELEASE_TABLET | ORAL | Status: AC
  Administered 2018-01-21 (×2): 40 meq via ORAL
  Filled 2018-01-21 (×2): qty 2

## 2018-01-21 MED ORDER — SODIUM CHLORIDE 0.9 % IV SOLN
1.0000 g | Freq: Two times a day (BID) | INTRAVENOUS | Status: DC
Start: 2018-01-21 — End: 2018-01-22
  Administered 2018-01-21 – 2018-01-22 (×2): 1 g via INTRAVENOUS
  Filled 2018-01-21 (×3): qty 1

## 2018-01-21 MED ORDER — SODIUM CHLORIDE 0.9 % IV SOLN
2000.0000 mg | Freq: Once | INTRAVENOUS | Status: AC
  Administered 2018-01-21: 2000 mg via INTRAVENOUS
  Filled 2018-01-21: qty 2000

## 2018-01-21 NOTE — Progress Notes (Signed)
RT called to pt's room to access him since his SATS dropped in tho the upper 80's.   Pt was places on Nonrebreathing SATS come up to 100%.  RT places pt back on Venturi mask on 14L which he was on previously.  SATS dropped again.  Pt was placed back on Nonrebreathing and SATS are back in the upper 90's.  RT will continue to monitor.

## 2018-01-21 NOTE — Progress Notes (Signed)
  Speech Language Pathology Treatment: Dysphagia  Patient Details Name: [name] MRN: [id number] DOB: 16-Oct-1920 Today's Date: 01/21/2018 Time: 6203-5597 SLP Time Calculation (min) (ACUTE ONLY): 12 min  Assessment / Plan / Recommendation Clinical Impression  Pt seen with minimal PO trials of thin liquid secondary to pt's report of lethargy and continued tenuous respiratory status.Pt frequently grunted throughout session, concerning for weak attempts at throat clearing, but pt attributed this to needing to belch. Other than one dry cough at the end of the session, pt had no other s/s concerning for aspiration. SLP reinforced esophageal precautions (sitting upright after meals, alternating liquids/solids, and frequent snacks throughout the day) to aid with pt's reported re-occurring globus sensation during breakfast. Continue to recommend Dys 2 and thin liquid diet with aspiration and esophageal precautions. SLP will continue to follow for continued tolerance with current diet consistencies and precautions.           HPI HPI: Pt is a 84 y.o.malepresenting with SOB. CXR shows diffuse bilateral infiltrates, primarily in the upper lobes, consistent with pulmonary edema and/or PNA.  PMH is significant forPAF, HTN, T2DM, sinus node dysfunction, CKD III. He has a h/o esophageal dysphagia, including GERD, dysmotility, and strictures requiring multiple dilations. Pt did have MBS completed in October 2015 that showed normal oropharyngeal function for his age.      SLP Plan  Continue with current plan of care       Recommendations  Diet recommendations: Dysphagia 2 (fine chop) Liquids provided via: Cup;Straw Medication Administration: Crushed with puree Supervision: Patient able to self feed;Intermittent supervision to cue for compensatory strategies Compensations: Slow rate;Small sips/bites;Follow solids with liquid Postural Changes and/or Swallow Maneuvers: Seated upright 90 degrees;Upright  30-60 min after meal                Oral Care Recommendations: Oral care BID Follow up Recommendations: None SLP Visit Diagnosis: Dysphagia, unspecified (R13.10) Plan: Continue with current plan of care       GO               [name] SLP Student Clinician  [name] 01/21/2018, 3:23 PM

## 2018-01-21 NOTE — Progress Notes (Signed)
Pharmacy Antibiotic Note  [name] is a 84 y.o. male admitted on 01/14/2018 with aspiration pneumonia.  Pharmacy has been consulted for vancomycin and meropenem dosing.  Plan: Vancomycin 2000 mg IV x 1, followed by 1000 mg IV every 24 hours.  Goal trough 15-20 mcg/mL. Meropenem 1 gram IV q 12 hours Monitor clinical progress, cultures/sensitivities, renal function, abx plan Vancomycin trough as indicated   Height: 5\' 6"  (167.6 cm) Weight: 181 lb (82.1 kg) IBW/kg (Calculated) : 63.8  Temp (24hrs), Avg:97.4 F (36.3 C), Min:96.8 F (36 C), Max:97.9 F (36.6 C)  Recent Labs  Lab 01/17/18 0457 01/18/18 0512 01/19/18 0935 01/20/18 0439 01/21/18 0650  WBC 13.7* 13.5* 13.9* 18.1* 14.2*  CREATININE 1.33* 1.14 1.27* 1.33* 1.48*    Estimated Creatinine Clearance: 30 mL/min (A) (by C-G formula based on SCr of 1.48 mg/dL (H)).    Allergies  Allergen Reactions  . Codeine Nausea Only    Antimicrobials this admission: Augmentin 2/28 >>3/2 Vanc 3/2 >>3/4 Cefepime 3/2 >>3/4 Unasyn 3/4>>3/7 Vancomycin 3/7 >> Merrem 3/7 >>  Dose adjustments this admission:  Microbiology results: 3/1 MRSA PCR negative   Thank you for allowing pharmacy to participate in this patients care.  Jens Som, PharmD Clinical phone for 01/21/2018 from 7a-3:30p: x [phone] If after 3:30p, please call main pharmacy at: [phone] 01/21/2018 12:07 PM

## 2018-01-21 NOTE — Progress Notes (Addendum)
Family Medicine Teaching Service Daily Progress Note Intern Pager: [phone]  Patient name: [name] Medical record number: [id number] Date of birth: 09-10-1920 Age: 84 y.o. Gender: male  Primary Care Provider: Dickie La, MD Consultants: GI, curbside cardiology  Code Status: DNR  Pt Overview and Major Events to Date:  Admitted to [facility] on 01/14/18  Assessment and Plan: [name] a 84 y.o.malepresenting with SOB. PMH is significant forPAF, HTN, T2DM, sinus node dysfunction, CKD III.  Acute on chronic respiratory failure 2/2 CHF exacerbation+ Aspiration PNA WorseningO2 requirement (on RA at [facility]), currently satting at84% on14L via face mask. S/p IV lasix. Net neg -4.427L since admission, 724mL output on 3/6.Wtstable at 181lbs.Leukocytosis improved to 14.2 from 18.1. Now on dysphagia III diet and eval by GI as noted below. Afebrile. Strep pneumo urinary antigen negative  -40 mg IV lasix  -supplemental O2 -outpatientcardiology follow up -strict I/Os and daily weights -continuesolu-medrol125 mg q6hrs -continue unasyn (3/4- ) -will give morphine q2h prn for respiratory distress -will obtain procalcitonin   Dysphagia in setting of likely aspiration pneumonia  Has had previous EGD showing smooth esophageal narrowing, and h/o multiple esophageal dilations in the past with a few weeks of improved swallowing, but no history of true stricture. Esophogram limited due to orthopnea and hypoxia if patient was laying flat.  -Likely etiology of aspiration pneumonia.  -GI consulted,appreciate recommendations -continue to work with SLP -dysphagia II diet  Chest Pain - resolved Likely related to dyspnea as above.EKG with paced rhythm with no ST changes. Well's score of 0 so doubt PE as etiology of chest pain.Troponins trended at 0.03>0.03>0.03.   Atrial fibrillation Chronic, stable. CHADSVasc score of 4 on admission.HASBLED score of 2.Previously on Xarelto for  anticoagulationthis was DC'd prior to admitdue to advanced age and propensity to fall.Managed with dronedarone 400mg  tablet bid, and nebivolol 5mg  daily.  -continue dronedarone 400mg  tablet bid, per Cardiology can continue as LVEF >35% -continue nebivolol5mg  daily  Sinus node dysfunction/av block/Pacemaker Patient with prolonged QT/qtc interval on past ekgs.QTc >550 on admission.He does have a pacemaker. -continue dronederone400 mg twice a day>>> relative contraindication in HF, plan to continue per cardiology curbside (see separate note from Dr. Debara Pickett) -continue nebivolol5 mg daily -Avoid QT prolonging medications  Hypokalemia K of 3.1 -replete with Kdur -check Mg   Hyponatremia-resolved Na of 140, likely 2/2 decreased PO intake due to difficulty swallowing.  -boost supplementaiton  Hypertension Hypotensive withBP115/54 Home meds: nebivolol. -nebivolol5mg   CKD Stage III Current Cr of1.48 with GFR stable at44.BLCr 1.1-1.3. -daily bmp  Type II Diabetes Patient with past diagnosis of T2DM. Last A1c 5.8on 08/10/2017.Does not take any oral medication or insulin.Glucose in130  Sacral ulcer, stage 1 Stable. New stage 1 pressure ulcer, likely 2/2 decreased motility.  -will need frequent positional changes -apply foam dressing to relieve pressure -will change mattress to avoid increased pressure  Constipation  Reports BM this morning  -soap suds enema prn constipation  -miralax daily  FEN/GI:Dysphagia 2 diet(NPO prior to esophogram), boost supplementation Prophylaxis:Lovenox  Disposition: continued inpatient stay   Subjective:  Patient states he is doing better this morning. States his breathing is improved. Patient states he had BM yesterday and today. Denies CP. States he is eating well.   Objective: Temp:  [96.8 F (36 C)-97.9 F (36.6 C)] 96.8 F (36 C) (03/07 0448) Pulse Rate:  [70-74] 74 (03/07 1000) Resp:  [16-22] 20  (03/07 0448) BP: (101-123)/(54-71) 115/54 (03/07 0448) SpO2:  [84 %-96 %] 93 % (03/07  1000) Physical Exam: General: awake and alert, NAD, sitting up in bed eating breakfast, [place] in place  Cardiovascular: RRR, no MRG  Respiratory: crackles auscultated at bases bilaterally, slight expiratory wheezes in middle lobes, no increased work of breathing, speaking full sentences  Abdomen: soft, non tender, non distended, bowel sounds normal  Extremities: non tender, no edema   Laboratory: Recent Labs  Lab 01/19/18 0935 01/20/18 0439 01/21/18 0650  WBC 13.9* 18.1* 14.2*  HGB 10.2* 9.7* 9.3*  HCT 31.1* 30.0* 29.7*  PLT 348 356 323   Recent Labs  Lab 01/19/18 0935 01/20/18 0439 01/21/18 0650  NA 138 139 140  K 3.8 3.5 3.1*  CL 100* 100* 99*  CO2 24 27 27   BUN 31* 40* 44*  CREATININE 1.27* 1.33* 1.48*  CALCIUM 8.2* 8.0* 7.9*  GLUCOSE 151* 159* 263*    Imaging/Diagnostic Tests: Dg Chest 2 View  Result Date: 01/18/2018 CLINICAL DATA:  Shortness of breath. History of atrial fibrillation, cancer, heart failure, hypertension, pacemaker, diabetes. Former smoker. EXAM: CHEST  2 VIEW COMPARISON:  Chest x-rays dated 01/16/2018 in 01/14/2018. FINDINGS: Stable cardiomegaly. Left chest wall pacemaker leads appear stable in position. Interstitial and alveolar pulmonary opacities again noted bilaterally, right greater than left, stable compared to the most recent chest x-ray, increased compared to the earlier chest x-ray of 01/14/2018. Small bilateral pleural effusions. No new lung findings. No pneumothorax seen. IMPRESSION: 1. Patchy bilateral interstitial and alveolar opacities, right greater than left, stable compared to most recent chest x-ray of 01/16/2018, most compatible with CHF/pulmonary edema. 2. Small bilateral pleural effusions. 3. Stable cardiomegaly. Electronically Signed   By: Franki Cabot M.D.   On: 01/18/2018 09:08   Dg Chest 2 View  Result Date: 01/16/2018 CLINICAL DATA:  Shortness of  breath EXAM: CHEST  2 VIEW COMPARISON:  January 14, 2018 FINDINGS: Worsening bilateral infiltrates, right greater than left, most prominent in the upper lungs. No other changes. IMPRESSION: Worsening bilateral upper lobe predominant infiltrates. Recommend follow-up to resolution. Electronically Signed   By: Dorise Bullion III M.D   On: 01/16/2018 11:24   Dg Chest 2 View  Result Date: 01/14/2018 CLINICAL DATA:  Shortness of breath. EXAM: CHEST  2 VIEW COMPARISON:  Chest x-ray 03/14/2017. FINDINGS: Cardiac pacer with lead tip over the right atrium right ventricle. Cardiomegaly. Diffuse bilateral pulmonary infiltrates are noted consistent with pulmonary edema and/or pneumonia. Pulmonary infiltrates are primarily in the upper lobes. Small bilateral pleural effusions. No acute bony abnormality. IMPRESSION: Cardiac pacer noted stable position. Cardiomegaly. Diffuse bilateral pulmonary infiltrates, primarily in the upper lobes. Findings are consistent pulmonary edema and/or pneumonia. Small bilateral pleural effusions. Electronically Signed   By: Marcello Moores  Register   On: 01/14/2018 07:51   Dg Esophagus W/water Raelene Bott Cm  Result Date: 01/19/2018 CLINICAL DATA:  Concern for aspiration. History of GERD. History of esophageal strictures with multiple dilatations. EXAM: ESOPHOGRAM/BARIUM SWALLOW TECHNIQUE: Single contrast examination was performed using isotopic water-soluble contrast. FLUOROSCOPY TIME:  Fluoroscopy Time:  24 seconds Radiation Exposure Index (if provided by the fluoroscopic device): 6.8 mGy Number of Acquired Spot Images: 0 COMPARISON:  None. FINDINGS: Extensive airspace disease by chest radiography yesterday. The patient would become notably hypoxic when recumbent (70s O2). Patient could not stand safely for this study and had to take swallows sitting upright and in the positioned on his back for imaging. This was a markedly suboptimal exam. There is relative smooth narrowing at the lower esophagus that  did not cause stasis of thin liquid. It  is unclear if this is due to a stricture or under distention by small swallows. Motility was unremarkable for age. No aspiration was seen. IMPRESSION: 1. Very limited study due to orthopnea with prominent hypoxia when recumbent. 2. Only the most severe stricture would be detected on this study. There was no stasis/delayed passage of thin liquid. Electronically Signed   By: Monte Fantasia M.D.   On: 01/19/2018 14:11     Caroline More, DO 01/21/2018, 12:04 PM PGY-1, [place] Intern pager: [phone], text pages welcome

## 2018-01-22 ENCOUNTER — Ambulatory Visit: Payer: Medicare Other | Admitting: Gastroenterology

## 2018-01-22 LAB — PROCALCITONIN: Procalcitonin: <0.1 ng/mL

## 2018-01-22 LAB — CBC
HCT: 31.7 % — ABNORMAL LOW (ref 39.0–52.0)
Hemoglobin: 10 g/dL — ABNORMAL LOW (ref 13.0–17.0)
MCH: 28.4 pg (ref 26.0–34.0)
MCHC: 31.5 g/dL (ref 30.0–36.0)
MCV: 90.1 fL (ref 78.0–100.0)
PLATELETS: 303 10*3/uL (ref 150–400)
RBC: 3.52 MIL/uL — ABNORMAL LOW (ref 4.22–5.81)
RDW: 15.5 % (ref 11.5–15.5)
WBC: 16.7 10*3/uL — ABNORMAL HIGH (ref 4.0–10.5)

## 2018-01-22 LAB — BASIC METABOLIC PANEL
Anion gap: 12 (ref 5–15)
BUN: 49 mg/dL — AB (ref 6–20)
CALCIUM: 7.8 mg/dL — AB (ref 8.9–10.3)
CO2: 30 mmol/L (ref 22–32)
Chloride: 99 mmol/L — ABNORMAL LOW (ref 101–111)
Creatinine, Ser: 1.28 mg/dL — ABNORMAL HIGH (ref 0.61–1.24)
GFR calc Af Amer: 53 mL/min — ABNORMAL LOW (ref >60)
GFR, EST NON AFRICAN AMERICAN: 46 mL/min — AB (ref >60)
GLUCOSE: 206 mg/dL — AB (ref 65–99)
Potassium: 4.1 mmol/L (ref 3.5–5.1)
Sodium: 141 mmol/L (ref 135–145)

## 2018-01-22 MED ORDER — MORPHINE SULFATE (PF) 2 MG/ML IV SOLN
0.5000 mg | INTRAVENOUS | Status: DC | PRN
  Administered 2018-01-22 – 2018-01-25 (×2): 0.5 mg via INTRAVENOUS
  Filled 2018-01-22: qty 1

## 2018-01-22 MED ORDER — MORPHINE SULFATE (PF) 2 MG/ML IV SOLN
0.5000 mg | INTRAVENOUS | Status: DC | PRN
  Filled 2018-01-22: qty 1

## 2018-01-22 NOTE — Progress Notes (Deleted)
1 

## 2018-01-22 NOTE — Progress Notes (Signed)
   01/22/18 1646  Vitals  Resp 20  Oxygen Therapy  SpO2 92 %  O2 Device Non-rebreather Mask  Patient is alert to voice but drifts off to sleep quickly. Urine output in last 24 hours 300 mls.  Paged provider to make aware. Provider states comfort measures is plan of care. Morphine order for increased respiratory rate, pain, respiratory hunger, distress, discomfort.

## 2018-01-22 NOTE — Progress Notes (Signed)
Patient external catheter leaked, unknown how much urine.

## 2018-01-22 NOTE — Progress Notes (Signed)
Family Medicine Teaching Service Daily Progress Note Intern Pager: [phone]  Patient name: [name] Medical record number: [id number] Date of birth: Mar 28, 1920 Age: 84 y.o. Gender: male  Primary Care Provider: Dickie La, MD Consultants: GI, curbside cardiology  Code Status: DNR  Pt Overview and Major Events to Date:  Admitted to [facility] on 01/14/18  Assessment and Plan: [name] a 84 y.o.malepresenting with SOB. PMH is significant forPAF, HTN, T2DM, sinus node dysfunction, CKD III.  Acute on chronic respiratory failure 2/2 CHF exacerbation+ Aspiration PNA WorseningO2 requirement (on RA at [facility]), satting 96& on 15L NRB. S/p IV lasix. Net neg -4.277L since admission,238mL output on 3/8.Wtstable at 181lbs on 3/6. Now on dysphagia II diet and eval by GI as noted below. Afebrile. Strep pneumo urinary antigen negative. Legionella antigen negative.  -40 mg IV lasix prn  -supplemental O2 -outpatientcardiology follow up -strict I/Os and daily weights -continuesolu-medrol125 mg q6hrs -will give morphine q2h prn forrespiratory distress  Dysphagia in setting of likely aspiration pneumonia  Has had previous EGD showing smooth esophageal narrowing, and h/o multiple esophageal dilations in the past with a few weeks of improved swallowing, but no history of true stricture.Esophogram limited due to orthopnea and hypoxia if patient was laying flat. -Likelyetiology of aspiration pneumonia.  -GI consulted,appreciate recommendations -continue to work with SLP -dysphagia IIdiet  Chest Pain - resolved Likely related to dyspnea as above.EKG with paced rhythm with no ST changes. Well's score of 0 so doubt PE as etiology of chest pain.Troponins trended at 0.03>0.03>0.03.   Atrial fibrillation Chronic, stable. CHADSVasc score of 4 on admission.HASBLED score of 2.Previously on Xarelto for anticoagulationthis was DC'd prior to admitdue to advanced age and propensity to  fall.Managed with dronedarone 400mg  tablet bid, and nebivolol 5mg  daily.  -continue dronedarone 400mg  tablet bid, per Cardiology can continue as LVEF >35% -continue nebivolol5mg  daily  Sinus node dysfunction/av block/Pacemaker Patient with prolonged QT/qtc interval on past ekgs.QTc >550 on admission.He does have a pacemaker. -continue dronederone400 mg twice a day>>> relative contraindication in HF, plan to continue per cardiology curbside (see separate note from Dr. Debara Pickett) -continue nebivolol5 mg daily -Avoid QT prolonging medications  Hypokalemia-resolved K of 4.1 on 3/8. -no longer measuring labs per patient's family requests   Hyponatremia-resolved Na of 141 on 3//8, likely2/2 decreased PO intake due to difficulty swallowing.  -boost supplementation -no longer measuring labs per patient's family requests   Hypertension  Current BP 153/56. Home meds: nebivolol. -nebivolol5mg   CKD Stage III Cr of1.28on 3/8 with GFR stable at46.BLCr 1.1-1.3. -no longer measuring labs per patient's family requests  Type II Diabetes Patient with past diagnosis of T2DM. Last A1c 5.8on 08/10/2017.Does not take any oral medication or insulin.Glucose in130  Sacral ulcer, stage 1 Stable.New stage 1 pressure ulcer, likely 2/2 decreased motility.  -will need frequent positional changes -apply foam dressing to relieve pressure -will change mattress to avoid increased pressure  Constipation Reports BM this morning  -soap suds enemaprn constipation -miralax daily  FEN/GI:Dysphagia 2 diet(NPO prior to esophogram), boost supplementation Prophylaxis:Lovenox  Disposition: continued inpatient stay   Subjective:  Patient states he is feeling better. Reports improvement in breathing. Denies CP. Reports continued difficulty with eating stating food "gets stuck". In good spirits with visitor in room.   Objective: Temp:  [97.8 F (36.6 C)-98.6 F (37 C)]  97.8 F (36.6 C) (03/09 0517) Pulse Rate:  [69-79] 71 (03/09 0855) Resp:  [20-24] 20 (03/09 0811) BP: (126-153)/(56-98) 153/56 (03/09 0855) SpO2:  [92 %-100 %]  96 % (03/09 0811) Physical Exam: General: awake and alert, sitting up in bed eating breakfast, NAD Cardiovascular: RRR, no MRG Respiratory: some crackles throughout lungs, no increased WOB Abdomen: non tender, non distended, bowel sounds normal  Extremities: no edema, tenderness bilaterally   Laboratory: Recent Labs  Lab 01/20/18 0439 01/21/18 0650 01/22/18 0420  WBC 18.1* 14.2* 16.7*  HGB 9.7* 9.3* 10.0*  HCT 30.0* 29.7* 31.7*  PLT 356 323 303   Recent Labs  Lab 01/20/18 0439 01/21/18 0650 01/22/18 0420  NA 139 140 141  K 3.5 3.1* 4.1  CL 100* 99* 99*  CO2 27 27 30   BUN 40* 44* 49*  CREATININE 1.33* 1.48* 1.28*  CALCIUM 8.0* 7.9* 7.8*  GLUCOSE 159* 263* 206*    Imaging/Diagnostic Tests: Dg Chest 2 View  Result Date: 01/18/2018 CLINICAL DATA:  Shortness of breath. History of atrial fibrillation, cancer, heart failure, hypertension, pacemaker, diabetes. Former smoker. EXAM: CHEST  2 VIEW COMPARISON:  Chest x-rays dated 01/16/2018 in 01/14/2018. FINDINGS: Stable cardiomegaly. Left chest wall pacemaker leads appear stable in position. Interstitial and alveolar pulmonary opacities again noted bilaterally, right greater than left, stable compared to the most recent chest x-ray, increased compared to the earlier chest x-ray of 01/14/2018. Small bilateral pleural effusions. No new lung findings. No pneumothorax seen. IMPRESSION: 1. Patchy bilateral interstitial and alveolar opacities, right greater than left, stable compared to most recent chest x-ray of 01/16/2018, most compatible with CHF/pulmonary edema. 2. Small bilateral pleural effusions. 3. Stable cardiomegaly. Electronically Signed   By: Franki Cabot M.D.   On: 01/18/2018 09:08   Dg Chest 2 View  Result Date: 01/16/2018 CLINICAL DATA:  Shortness of breath EXAM:  CHEST  2 VIEW COMPARISON:  January 14, 2018 FINDINGS: Worsening bilateral infiltrates, right greater than left, most prominent in the upper lungs. No other changes. IMPRESSION: Worsening bilateral upper lobe predominant infiltrates. Recommend follow-up to resolution. Electronically Signed   By: Dorise Bullion III M.D   On: 01/16/2018 11:24   Dg Chest 2 View  Result Date: 01/14/2018 CLINICAL DATA:  Shortness of breath. EXAM: CHEST  2 VIEW COMPARISON:  Chest x-ray 03/14/2017. FINDINGS: Cardiac pacer with lead tip over the right atrium right ventricle. Cardiomegaly. Diffuse bilateral pulmonary infiltrates are noted consistent with pulmonary edema and/or pneumonia. Pulmonary infiltrates are primarily in the upper lobes. Small bilateral pleural effusions. No acute bony abnormality. IMPRESSION: Cardiac pacer noted stable position. Cardiomegaly. Diffuse bilateral pulmonary infiltrates, primarily in the upper lobes. Findings are consistent pulmonary edema and/or pneumonia. Small bilateral pleural effusions. Electronically Signed   By: Marcello Moores  Register   On: 01/14/2018 07:51   Dg Esophagus W/water Raelene Bott Cm  Result Date: 01/19/2018 CLINICAL DATA:  Concern for aspiration. History of GERD. History of esophageal strictures with multiple dilatations. EXAM: ESOPHOGRAM/BARIUM SWALLOW TECHNIQUE: Single contrast examination was performed using isotopic water-soluble contrast. FLUOROSCOPY TIME:  Fluoroscopy Time:  24 seconds Radiation Exposure Index (if provided by the fluoroscopic device): 6.8 mGy Number of Acquired Spot Images: 0 COMPARISON:  None. FINDINGS: Extensive airspace disease by chest radiography yesterday. The patient would become notably hypoxic when recumbent (70s O2). Patient could not stand safely for this study and had to take swallows sitting upright and in the positioned on his back for imaging. This was a markedly suboptimal exam. There is relative smooth narrowing at the lower esophagus that did not cause  stasis of thin liquid. It is unclear if this is due to a stricture or under distention by small swallows. Motility  was unremarkable for age. No aspiration was seen. IMPRESSION: 1. Very limited study due to orthopnea with prominent hypoxia when recumbent. 2. Only the most severe stricture would be detected on this study. There was no stasis/delayed passage of thin liquid. Electronically Signed   By: Monte Fantasia M.D.   On: 01/19/2018 14:11     Caroline More, DO 01/23/2018, 10:02 AM PGY-1, [place] Intern pager: [phone], text pages welcome

## 2018-01-22 NOTE — Progress Notes (Signed)
I spoke with resident yesterday and they planned on moving toward a more comfort measure level of care, however no orders or notes have reflected this. I made them aware that patient was desaturating on venturi mask overnight but was stable at that time.   Paged provider this morning in regard to plan of care, patient required non rebreather mask on 14 all night. Provider to update nursing.

## 2018-01-22 NOTE — Progress Notes (Signed)
Family Medicine Teaching Service Daily Progress Note Intern Pager: [phone]  Patient name: [name] Medical record number: [id number] Date of birth: [birth year] Age: 84 y.o. Gender: male  Primary Care Provider: Dickie La, MD Consultants: GI, curbside cardiology  Code Status: DNR  Pt Overview and Major Events to Date:  Admitted to [facility] on 01/14/18  Assessment and Plan: [name] a 84 y.o.malepresenting with SOB. PMH is significant forPAF, HTN, T2DM, sinus node dysfunction, CKD III.  Acute on chronic respiratory failure 2/2 CHF exacerbation+ Aspiration PNA WorseningO2 requirement (on RA at [facility]), satting mid 90s on non-rebreather. S/p IV lasix. Net neg -4.427L since admission, 1351mL output on 3/7.Wtstable at 181lbs.Leukocytosis improved worsend to 16.7 from 14.2. Now on dysphagia III diet and eval by GI as noted below. Afebrile. Strep pneumo urinary antigen negative  -40 mg IV lasix  -supplemental O2 -outpatientcardiology follow up -strict I/Os and daily weights -continuesolu-medrol125 mg q6hrs -d/c antibiotics -will give morphine q2h prn for respiratory distress  Dysphagia in setting of likely aspiration pneumonia  Has had previous EGD showing smooth esophageal narrowing, and h/o multiple esophageal dilations in the past with a few weeks of improved swallowing, but no history of true stricture. Esophogram limited due to orthopnea and hypoxia if patient was laying flat.  -Likely etiology of aspiration pneumonia.  -GI consulted,appreciate recommendations -continue to work with SLP -dysphagia II diet  Chest Pain - resolved Likely related to dyspnea as above.EKG with paced rhythm with no ST changes. Well's score of 0 so doubt PE as etiology of chest pain.Troponins trended at 0.03>0.03>0.03.   Atrial fibrillation Chronic, stable. CHADSVasc score of 4 on admission.HASBLED score of 2.Previously on Xarelto for anticoagulationthis was DC'd prior to  admitdue to advanced age and propensity to fall.Managed with dronedarone 400mg  tablet bid, and nebivolol 5mg  daily.  -continue dronedarone 400mg  tablet bid, per Cardiology can continue as LVEF >35% -continue nebivolol5mg  daily  Sinus node dysfunction/av block/Pacemaker Patient with prolonged QT/qtc interval on past ekgs.QTc >550 on admission.He does have a pacemaker. -continue dronederone400 mg twice a day>>> relative contraindication in HF, plan to continue per cardiology curbside (see separate note from Dr. Debara Pickett) -continue nebivolol5 mg daily -Avoid QT prolonging medications  Hypokalemia K of 4.1, mag 2.5 -monitor K daily  Hyponatremia-resolved Na of 140, likely 2/2 decreased PO intake due to difficulty swallowing.  -boost supplementaiton  Hypertension-resolved 129/81 Home meds: nebivolol. -nebivolol5mg   CKD Stage III Current Cr of1.28 with GFR stable at46.BLCr 1.1-1.3. -daily bmp  Type II Diabetes Patient with past diagnosis of T2DM. Last A1c 5.8on 08/10/2017.Does not take any oral medication or insulin.Glucose in130  Sacral ulcer, stage 1 Stable. New stage 1 pressure ulcer, likely 2/2 decreased motility.  -will need frequent positional changes -apply foam dressing to relieve pressure -will change mattress to avoid increased pressure  Constipation  Reports BM this morning  -soap suds enema prn constipation  -miralax daily  FEN/GI:Dysphagia 2 diet(NPO prior to esophogram), boost supplementation Prophylaxis:Lovenox  Disposition: continued inpatient stay   Subjective:  Patient was asleep on rounds and too drowsy to wake up for interview  Objective: Temp:  [97.9 F (36.6 C)-98.1 F (36.7 C)] 98.1 F (36.7 C) (03/08 0350) Pulse Rate:  [68-76] 76 (03/08 0350) Resp:  [16-20] 16 (03/08 0350) BP: (103-129)/(61-81) 129/81 (03/08 0350) SpO2:  [84 %-100 %] 95 % (03/08 0350) Physical Exam: General: sleeping  peacefully Cardiovascular: RRR, no MRG  Respiratory: crackles auscultated at bases bilaterally, slight expiratory wheezes in middle lobes, mild IWB,  Abdomen: soft, non tender, non distended, bowel sounds normal  Extremities: no edema   Laboratory: Recent Labs  Lab 01/20/18 0439 01/21/18 0650 01/22/18 0420  WBC 18.1* 14.2* 16.7*  HGB 9.7* 9.3* 10.0*  HCT 30.0* 29.7* 31.7*  PLT 356 323 303   Recent Labs  Lab 01/20/18 0439 01/21/18 0650 01/22/18 0420  NA 139 140 141  K 3.5 3.1* 4.1  CL 100* 99* 99*  CO2 27 27 30   BUN 40* 44* 49*  CREATININE 1.33* 1.48* 1.28*  CALCIUM 8.0* 7.9* 7.8*  GLUCOSE 159* 263* 206*    Imaging/Diagnostic Tests: Dg Chest 2 View  Result Date: 01/18/2018 CLINICAL DATA:  Shortness of breath. History of atrial fibrillation, cancer, heart failure, hypertension, pacemaker, diabetes. Former smoker. EXAM: CHEST  2 VIEW COMPARISON:  Chest x-rays dated 01/16/2018 in 01/14/2018. FINDINGS: Stable cardiomegaly. Left chest wall pacemaker leads appear stable in position. Interstitial and alveolar pulmonary opacities again noted bilaterally, right greater than left, stable compared to the most recent chest x-ray, increased compared to the earlier chest x-ray of 01/14/2018. Small bilateral pleural effusions. No new lung findings. No pneumothorax seen. IMPRESSION: 1. Patchy bilateral interstitial and alveolar opacities, right greater than left, stable compared to most recent chest x-ray of 01/16/2018, most compatible with CHF/pulmonary edema. 2. Small bilateral pleural effusions. 3. Stable cardiomegaly. Electronically Signed   By: Franki Cabot M.D.   On: 01/18/2018 09:08   Dg Chest 2 View  Result Date: 01/16/2018 CLINICAL DATA:  Shortness of breath EXAM: CHEST  2 VIEW COMPARISON:  January 14, 2018 FINDINGS: Worsening bilateral infiltrates, right greater than left, most prominent in the upper lungs. No other changes. IMPRESSION: Worsening bilateral upper lobe predominant  infiltrates. Recommend follow-up to resolution. Electronically Signed   By: Dorise Bullion III M.D   On: 01/16/2018 11:24   Dg Chest 2 View  Result Date: 01/14/2018 CLINICAL DATA:  Shortness of breath. EXAM: CHEST  2 VIEW COMPARISON:  Chest x-ray 03/14/2017. FINDINGS: Cardiac pacer with lead tip over the right atrium right ventricle. Cardiomegaly. Diffuse bilateral pulmonary infiltrates are noted consistent with pulmonary edema and/or pneumonia. Pulmonary infiltrates are primarily in the upper lobes. Small bilateral pleural effusions. No acute bony abnormality. IMPRESSION: Cardiac pacer noted stable position. Cardiomegaly. Diffuse bilateral pulmonary infiltrates, primarily in the upper lobes. Findings are consistent pulmonary edema and/or pneumonia. Small bilateral pleural effusions. Electronically Signed   By: Marcello Moores  Register   On: 01/14/2018 07:51   Dg Esophagus W/water Raelene Bott Cm  Result Date: 01/19/2018 CLINICAL DATA:  Concern for aspiration. History of GERD. History of esophageal strictures with multiple dilatations. EXAM: ESOPHOGRAM/BARIUM SWALLOW TECHNIQUE: Single contrast examination was performed using isotopic water-soluble contrast. FLUOROSCOPY TIME:  Fluoroscopy Time:  24 seconds Radiation Exposure Index (if provided by the fluoroscopic device): 6.8 mGy Number of Acquired Spot Images: 0 COMPARISON:  None. FINDINGS: Extensive airspace disease by chest radiography yesterday. The patient would become notably hypoxic when recumbent (70s O2). Patient could not stand safely for this study and had to take swallows sitting upright and in the positioned on his back for imaging. This was a markedly suboptimal exam. There is relative smooth narrowing at the lower esophagus that did not cause stasis of thin liquid. It is unclear if this is due to a stricture or under distention by small swallows. Motility was unremarkable for age. No aspiration was seen. IMPRESSION: 1. Very limited study due to orthopnea with  prominent hypoxia when recumbent. 2. Only the most severe stricture would be detected  on this study. There was no stasis/delayed passage of thin liquid. Electronically Signed   By: Monte Fantasia M.D.   On: 01/19/2018 14:11     Sherene Sires, DO 01/22/2018, 9:38 AM PGY-1, [place] Intern pager: [phone], text pages welcome

## 2018-01-22 NOTE — Progress Notes (Signed)
Attempted to wean patient off non-rebreather mask. When placed on 14 L/min on venturi mask pt O2 saturation was 84%. Placed patient back on nonrebreather mask at this time and saturation is 92%-96%.. Patient is in no distress at this time. Will continue to monitor and treat per MD orders.

## 2018-01-24 MED ORDER — FUROSEMIDE 10 MG/ML IJ SOLN
40.0000 mg | Freq: Once | INTRAMUSCULAR | Status: AC
  Administered 2018-01-24: 40 mg via INTRAVENOUS
  Filled 2018-01-24: qty 4

## 2018-01-24 NOTE — Progress Notes (Signed)
Family Medicine Teaching Service Daily Progress Note Intern Pager: [phone]  Patient name: [name] Medical record number: [id number] Date of birth: 1920-09-21 Age: 84 y.o. Gender: male  Primary Care Provider: Dickie La, MD Consultants: GI, curbside cardiology  Code Status: DNR  Pt Overview and Major Events to Date:  Admitted to [facility] on 01/14/18   Assessment and Plan: [name] a 84 y.o.malepresenting with SOB. PMH is significant forPAF, HTN, T2DM, sinus node dysfunction, CKD III.  Acute on chronic respiratory failure 2/2 CHF exacerbation+ Aspiration PNA WorseningO2 requirement (on RA at BL),satting 94% on 15L NRB. S/p IV lasix. Net neg -4.057L since admission,238mL output on 3/9.Wtstable at 181lbs on 3/6. Now on dysphagia II diet and eval by GI as noted below. Afebrile. Strep pneumo urinary antigen negative. Legionella antigen negative.  -40 mg IV lasix prn  -supplemental O2 -outpatientcardiology follow up -strict I/Os and daily weights -continuesolu-medrol125 mg q6hrs -will give morphine q2h prn forrespiratory distress  Dysphagia in setting of likely aspiration pneumonia  Has had previous EGD showing smooth esophageal narrowing, and h/o multiple esophageal dilations in the past with a few weeks of improved swallowing, but no history of true stricture.Esophogram limited due to orthopnea and hypoxia if patient was laying flat. -Likelyetiology of aspiration pneumonia.  -GI consulted,appreciate recommendations -continue to work with SLP -dysphagia IIdiet  Atrial fibrillation Chronic, stable. CHADSVasc score of 4 on admission.HASBLED score of 2.Previously on Xarelto for anticoagulationthis was DC'd prior to admitdue to advanced age and propensity to fall.Managed with dronedarone 400mg  tablet bid, and nebivolol 5mg  daily.  -continue dronedarone 400mg  tablet bid, per Cardiology can continue as LVEF >35% -continue nebivolol5mg  daily  Sinus  node dysfunction/av block/Pacemaker Patient with prolonged QT/qtc interval on past ekgs.QTc >550 on admission.He does have a pacemaker. -continue dronederone400 mg twice a day>>> relative contraindication in HF, plan to continue per cardiology curbside (see separate note from Dr. Debara Pickett) -continue nebivolol5 mg daily -Avoid QT prolonging medications  Hypertension  Current BP 152/65. Home meds: nebivolol. -nebivolol5mg   CKD Stage III Cr of1.28on 3/8 with GFR stable at46.BLCr 1.1-1.3. -no longer measuring labs per patient's family requests  Type II Diabetes Patient with past diagnosis of T2DM. Last A1c 5.8on 08/10/2017.Does not take any oral medication or insulin.Glucose in130  Sacral ulcer, stage 1 Stable.New stage 1 pressure ulcer, likely 2/2 decreased motility.  -will need frequent positional changes -apply foam dressing to relieve pressure -will change mattress to avoid increased pressure  Constipation Reports BM this morning  -soap suds enemaprn constipation -miralax daily  FEN/GI:Dysphagia 2 diet(NPO prior to esophogram), boost supplementation Prophylaxis:Lovenox  Disposition: continued inpatient stay   Subjective:  Patient today in distress. Unable to find hearing aids and very worried about it. Patient feels uncomfortable in bed despite our efforts to help. Reports breathing is doing well.   Objective: Temp:  [97.7 F (36.5 C)-98.2 F (36.8 C)] 97.7 F (36.5 C) (03/10 0037) Pulse Rate:  [70-79] 70 (03/10 0037) Resp:  [13-24] 20 (03/10 0037) BP: (133-153)/(56-66) 143/61 (03/10 0037) SpO2:  [93 %-96 %] 93 % (03/10 0037) Physical Exam: General: awake and alert, appears stressed, non-rebreather in place Cardiovascular: RRR, no MRG Respiratory: course crackles throughout, no increased work of breathing Abdomen: soft, non tender, non distended bowel sounds normal  Extremities: non tender, no edema   Laboratory: Recent Labs  Lab  01/20/18 0439 01/21/18 0650 01/22/18 0420  WBC 18.1* 14.2* 16.7*  HGB 9.7* 9.3* 10.0*  HCT 30.0* 29.7* 31.7*  PLT 356 323  303   Recent Labs  Lab 01/20/18 0439 01/21/18 0650 01/22/18 0420  NA 139 140 141  K 3.5 3.1* 4.1  CL 100* 99* 99*  CO2 27 27 30   BUN 40* 44* 49*  CREATININE 1.33* 1.48* 1.28*  CALCIUM 8.0* 7.9* 7.8*  GLUCOSE 159* 263* 206*   Imaging/Diagnostic Tests: Dg Chest 2 View  Result Date: 01/18/2018 CLINICAL DATA:  Shortness of breath. History of atrial fibrillation, cancer, heart failure, hypertension, pacemaker, diabetes. Former smoker. EXAM: CHEST  2 VIEW COMPARISON:  Chest x-rays dated 01/16/2018 in 01/14/2018. FINDINGS: Stable cardiomegaly. Left chest wall pacemaker leads appear stable in position. Interstitial and alveolar pulmonary opacities again noted bilaterally, right greater than left, stable compared to the most recent chest x-ray, increased compared to the earlier chest x-ray of 01/14/2018. Small bilateral pleural effusions. No new lung findings. No pneumothorax seen. IMPRESSION: 1. Patchy bilateral interstitial and alveolar opacities, right greater than left, stable compared to most recent chest x-ray of 01/16/2018, most compatible with CHF/pulmonary edema. 2. Small bilateral pleural effusions. 3. Stable cardiomegaly. Electronically Signed   By: Franki Cabot M.D.   On: 01/18/2018 09:08   Dg Chest 2 View  Result Date: 01/16/2018 CLINICAL DATA:  Shortness of breath EXAM: CHEST  2 VIEW COMPARISON:  January 14, 2018 FINDINGS: Worsening bilateral infiltrates, right greater than left, most prominent in the upper lungs. No other changes. IMPRESSION: Worsening bilateral upper lobe predominant infiltrates. Recommend follow-up to resolution. Electronically Signed   By: Dorise Bullion III M.D   On: 01/16/2018 11:24   Dg Chest 2 View  Result Date: 01/14/2018 CLINICAL DATA:  Shortness of breath. EXAM: CHEST  2 VIEW COMPARISON:  Chest x-ray 03/14/2017. FINDINGS: Cardiac  pacer with lead tip over the right atrium right ventricle. Cardiomegaly. Diffuse bilateral pulmonary infiltrates are noted consistent with pulmonary edema and/or pneumonia. Pulmonary infiltrates are primarily in the upper lobes. Small bilateral pleural effusions. No acute bony abnormality. IMPRESSION: Cardiac pacer noted stable position. Cardiomegaly. Diffuse bilateral pulmonary infiltrates, primarily in the upper lobes. Findings are consistent pulmonary edema and/or pneumonia. Small bilateral pleural effusions. Electronically Signed   By: Marcello Moores  Register   On: 01/14/2018 07:51   Dg Esophagus W/water Raelene Bott Cm  Result Date: 01/19/2018 CLINICAL DATA:  Concern for aspiration. History of GERD. History of esophageal strictures with multiple dilatations. EXAM: ESOPHOGRAM/BARIUM SWALLOW TECHNIQUE: Single contrast examination was performed using isotopic water-soluble contrast. FLUOROSCOPY TIME:  Fluoroscopy Time:  24 seconds Radiation Exposure Index (if provided by the fluoroscopic device): 6.8 mGy Number of Acquired Spot Images: 0 COMPARISON:  None. FINDINGS: Extensive airspace disease by chest radiography yesterday. The patient would become notably hypoxic when recumbent (70s O2). Patient could not stand safely for this study and had to take swallows sitting upright and in the positioned on his back for imaging. This was a markedly suboptimal exam. There is relative smooth narrowing at the lower esophagus that did not cause stasis of thin liquid. It is unclear if this is due to a stricture or under distention by small swallows. Motility was unremarkable for age. No aspiration was seen. IMPRESSION: 1. Very limited study due to orthopnea with prominent hypoxia when recumbent. 2. Only the most severe stricture would be detected on this study. There was no stasis/delayed passage of thin liquid. Electronically Signed   By: Monte Fantasia M.D.   On: 01/19/2018 14:11     Caroline More, DO 01/24/2018, 3:41 AM PGY-1,  [place] Intern pager: [phone], text pages  welcome

## 2018-01-24 NOTE — Plan of Care (Signed)
Progressing

## 2018-01-25 ENCOUNTER — Ambulatory Visit (INDEPENDENT_AMBULATORY_CARE_PROVIDER_SITE_OTHER): Payer: Medicare Other | Admitting: *Deleted

## 2018-01-25 DIAGNOSIS — I495 Sick sinus syndrome: Secondary | ICD-10-CM

## 2018-01-25 MED ORDER — SENNOSIDES-DOCUSATE SODIUM 8.6-50 MG PO TABS: 1.0000 | ORAL_TABLET | Freq: Two times a day (BID) | ORAL | 0 refills | Status: AC

## 2018-01-25 MED ORDER — NEBIVOLOL HCL 5 MG PO TABS
5.0000 mg | ORAL_TABLET | Freq: Every day | ORAL | Status: DC
  Administered 2018-01-26 – 2018-01-27 (×2): 5 mg via ORAL
  Filled 2018-01-25 (×3): qty 1

## 2018-01-25 MED ORDER — BISACODYL 10 MG RE SUPP: 10.0000 mg | Freq: Two times a day (BID) | RECTAL | 0 refills | Status: AC

## 2018-01-25 MED ORDER — MORPHINE SULFATE (PF) 2 MG/ML IV SOLN: 0.5000 mg | INTRAVENOUS | 0 refills | Status: DC | PRN

## 2018-01-25 NOTE — Progress Notes (Signed)
FPTS Interim Note:   Given goals of discontinuing aggressive treatment per Dr. Verlon Au discussions with daughter in law, will discontinue the following noncomfort medicines: solumedrol, lovenox, bystolic, dronedarone. Can discontinue PPI if patient desires. Dr. Tammi Klippel has discussed liberalizing patient's diet, but he wished to stay on dysphagia 2.   Ralene Ok, MD

## 2018-01-25 NOTE — Progress Notes (Signed)
Short visit with patient and had prayer for him.   Conard Novak, Chaplain   01/25/18 1400  Clinical Encounter Type  Visited With Patient  Visit Type Initial;Spiritual support  Referral From Nurse  Consult/Referral To Chaplain  Spiritual Encounters  Spiritual Needs Prayer  Stress Factors  Patient Stress Factors None identified  Family Stress Factors None identified

## 2018-01-25 NOTE — Progress Notes (Signed)
Family Medicine Teaching Service Daily Progress Note Intern Pager: [phone]  Patient name: [name] Medical record number: [id number] Date of birth: [birth year] Age: 84 y.o. 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 PMH is significant forPAF, HTN, T2DM, sinus node dysfunction, CKD III.  Acute on chronic respiratory failure 2/2 CHF exacerbation+ Aspiration PNA WorseningO2 requirement (on RA at BL),satting97%on 15L NRB. S/p IV lasix. Net neg-3.797L since admission,64mL output on 3/10.Wtstable at181lbson 3/6. Now on dysphagia II diet and eval by GI. Afebrile. Strep pneumo urinary antigen negative. Legionella antigen negative.S/p IV Vanc (3/2-3/4; 3/7-3/8 ), Cefepime (3/2 -3/4), Augmentin (2/28-3/2), unasyn (3/4-3/7), and meropenem (3/7-3/8 ).  -40 mg IV lasixprn -supplemental O2 -outpatientcardiology follow up -strict I/Os and daily weights -continuesolu-medrol125 mg q6hrs -will give morphine q2h prn forrespiratory distress  Dysphagia in setting of likely aspiration pneumonia  Has had previous EGD showing smooth esophageal narrowing, and h/o multiple esophageal dilations in the past with a few weeks of improved swallowing, but no history of true stricture.Esophogram limited due to orthopnea and hypoxia if patient was laying flat. -Likelyetiology of aspiration pneumonia.  -GI consulted,appreciate recommendations -dysphagia IIdiet  -continue to work with SLP  Atrial fibrillation Chronic, stable. CHADSVasc score of 4 on admission.HASBLED score of 2.Previously on Xarelto for anticoagulationthis was DC'd prior to admitdue to advanced age and propensity to fall.Managed with dronedarone 400mg  tablet bid, and nebivolol 5mg  daily.  -continue  dronedarone 400mg  tablet bid, per Cardiology can continue as LVEF >35% -continue nebivolol5mg  daily  Sinus node dysfunction/av block/Pacemaker Patient with prolonged QT/qtc interval on past ekgs.QTc >550 on admission.He does have a pacemaker. -continue dronederone400 mg twice a day>>> relative contraindication in HF, plan to continue per cardiology curbside (see separate note from Dr. Debara Pickett) -continue nebivolol5 mg daily -Avoid QT prolonging medications  Hypertension Current BP153/60.Home meds: nebivolol. -nebivolol5mg   CKD Stage III Cr of1.28on 3/8with GFR stable at46.BLCr 1.1-1.3. -no longer measuring labs per patient's family requests  Type II Diabetes Patient with past diagnosis of T2DM. Last A1c 5.8on 08/10/2017.Does not take any oral medication or insulin.Glucose in130  Sacral ulcer, stage 1 Stable.New stage 1 pressure ulcer, likely 2/2 decreased motility.  -will need frequent positional changes -apply foam dressing to relieve pressure -will change mattress to avoid increased pressure  Constipation Reports BM this morning  -soap suds enemaprn constipation -miralax daily  FEN/GI:Dysphagia 2 diet(NPO prior to esophogram), boost supplementation Prophylaxis:Lovenox  Disposition: hopeful for discharge to [facility] with hospice   Subjective:  Patient today reports his breathing is slightly improved. States he feels like he choked on his food yesterday and it still feels "stuck". Denies pain. Denies CP.   Objective: Temp:  [97.5 F (36.4 C)-98.4 F (36.9 C)] 97.6 F (36.4 C) (03/11 0901) Pulse Rate:  [71-77] 72 (03/11 0901) Resp:  [19-20] 19 (03/11 0901) BP: (130-160)/(47-65) 147/61 (03/11 0901) SpO2:  [95 %-97 %] 95 % (03/11 0901) Physical Exam: General: awake and alert, laying in bed, NRB on  Cardiovascular: RRR, no MRG Respiratory: CTAB, no wheezes, rales or rhonchi  Abdomen: soft, non tender, non distended, bowel sounds  normal  Extremities: no edema, non tender   Laboratory: Recent Labs  Lab 01/20/18 0439 01/21/18 0650 01/22/18 0420  WBC 18.1* 14.2* 16.7*  HGB 9.7* 9.3* 10.0*  HCT 30.0* 29.7* 31.7*  PLT 356 323 303   Recent Labs  Lab 01/20/18 0439 01/21/18 0650 01/22/18 0420  NA 139 140 141  K 3.5 3.1* 4.1  CL 100* 99* 99*  CO2 27 27 30   BUN 40* 44* 49*  CREATININE 1.33* 1.48* 1.28*  CALCIUM 8.0* 7.9* 7.8*  GLUCOSE 159* 263* 206Caroline More, DO 01/25/2018, 9:38 AM PGY-1, [place] Intern pager: [phone], text pages welcome

## 2018-01-25 NOTE — Progress Notes (Signed)
  Speech Language Pathology Treatment: Dysphagia  Patient Details Name: [name] MRN: [id number] DOB: [birth year] Today's Date: 01/25/2018 Time: 8270-7867 SLP Time Calculation (min) (ACUTE ONLY): 15 min  Assessment / Plan / Recommendation Clinical Impression  Skilled treatment session focused on dysphagia goals. Pt is currently wearing a nonrebreather mask at 15L. Upon arrival to pt's room, his O2 was 92%. Pt stated that he was thirsty but felt like something was "stuck in his throat" (globus sensation). SLP moved nonrebreather for pt to consume sips of thin liquids via straw. Pt with difficulty sustaining suction to pull liquids through straw. Pt with no overt s/s of aspiration and during consumption, pt's O2 remained at 93% however after consumption pt with significant decline O2 down to 81%. Pt required Max A cues for deep breathes and pt with significant coughing d/t increase in globus sensation when consuming thin liquids. Pt with ineffective coughing. Pt able to return O2 sats to 92% before SLP left room. DO provides that Hospice is being consulted today. Pt's prognosis for maintaining nutritional status with PO intake is poor d/t significant inability to maintain respiratory status. ST to follow.    HPI HPI: Pt is a 84 y.o.malepresenting with SOB. CXR shows diffuse bilateral infiltrates, primarily in the upper lobes, consistent with pulmonary edema and/or PNA.  PMH is significant forPAF, HTN, T2DM, sinus node dysfunction, CKD III. He has a h/o esophageal dysphagia, including GERD, dysmotility, and strictures requiring multiple dilations. Pt did have MBS completed in October 2015 that showed normal oropharyngeal function for his age.      SLP Plan  Continue with current plan of care       Recommendations  Diet recommendations: Dysphagia 2 (fine chop);Thin liquid Liquids provided via: Cup;Straw Medication Administration: Crushed with puree Supervision: Full supervision/cueing for  compensatory strategies;Patient able to self feed Compensations: Slow rate;Small sips/bites;Follow solids with liquid Postural Changes and/or Swallow Maneuvers: Seated upright 90 degrees;Upright 30-60 min after meal                Oral Care Recommendations: Oral care BID Follow up Recommendations: Other (comment)(Hospice/Palliative Care) SLP Visit Diagnosis: Dysphagia, unspecified (R13.10) Plan: Continue with current plan of care       [name] 01/25/2018, 8:54 AM

## 2018-01-25 NOTE — Progress Notes (Signed)
PT Cancellation Note  Patient Details Name: [name] MRN: [id number] DOB: 12/14/1919   Cancelled Treatment:    Reason Eval/Treat Not Completed: Other (comment).  MD has asked PT to stop therapy due to pt being transitioned to Hospice care and not being appropriate for further therapy.   Ramond Dial 01/25/2018, 4:24 PM   Mee Hives, PT MS Acute Rehab Dept. Number: [place] [facility] [place]

## 2018-01-25 NOTE — Clinical Social Work Note (Addendum)
Pt is non -rebreather at 33mL. The max the facility can take is 51mL and then has to get it approved. Paged MD. MD will talk to family about residential hospice tomorrow.   [place], [place]

## 2018-01-25 NOTE — NC FL2 (Signed)
[place] MEDICAID FL2 LEVEL OF CARE SCREENING TOOL     IDENTIFICATION  Patient Name: [name] Birthdate: 1920/08/24 Sex: male Admission Date (Current Location): 01/14/2018  [facility] and [place] Number:  Herbalist and Address:  [facility], [place] [address], [place], [place] [address]      Provider Number: [id number]  Attending Physician Name and Address:  McDiarmid, Blane Ohara, MD  Relative Name and Phone Number:       Current Level of Care: Hospital Recommended Level of Care: [place] Prior Approval Number:    Date Approved/Denied:   PASRR Number: [id number]  Discharge Plan: SNF    Current Diagnoses: Patient Active Problem List   Diagnosis Date Noted  . Pulmonary artery hypertension ([place]) 01/16/2018  . Acute diastolic congestive heart failure ([place]) 01/16/2018  . Heart failure with preserved ejection fraction ([place]) 01/16/2018  . Hypoxemia 01/14/2018  . Esophageal dysmotility 01/14/2018  . Cough with sputum 01/14/2018  . Leukocytosis 01/14/2018  . Elevated brain natriuretic peptide (BNP) level 01/14/2018  . Esophageal dysphagia 01/14/2018  . Bilateral pulmonary infiltrates on chest x-ray 01/14/2018  . Prolonged Q-T interval on ECG 01/14/2018  . Aspiration pneumonia of both upper lobes due to regurgitated food ([place])   . Postop check   . Elective surgery   . Normocytic anemia   . Essential hypertension   . Second degree AV block 11/29/2014  . Abnormal esophagram 09/07/2014  . Dysphagia 08/23/2014  . Pacemaker - Medtronic Adapta 2006, gen change 2015 07/23/2013  . Chronic right hip pain 09/22/2012  . Testicular swelling, right 09/22/2012  . Presbycusis 05/14/2012  . Paroxysmal atrial fibrillation ([place]) 01/30/2012  . Hx SBO 01/30/2012  . S/P appendectomy 01/30/2012  . CHRONIC KIDNEY DISEASE STAGE III (MODERATE) 12/03/2010  . ALLERGIC RHINITIS 09/05/2009  . HOARSENESS 05/15/2009  . HIATAL HERNIA 12/14/2008  .  Diverticulosis of large intestine 12/14/2008  . COLONIC POLYPS, HYPERPLASTIC, HX OF 12/14/2008  . ESOPHAGEAL STRICTURE 10/04/2008  . Other vitamin B12 deficiency anemia 01/26/2008  . GLAUCOMA 01/14/2007  . CATARACT 01/14/2007  . SICK SINUS SYNDROME 01/14/2007  . GASTROESOPHAGEAL REFLUX, NO ESOPHAGITIS 01/14/2007    Orientation RESPIRATION BLADDER Height & Weight     Self, Time, Situation, Place  O2(Non-rebreather, 2mL) Incontinent Weight: 181 lb (82.1 kg) Height:  5\' 6"  (167.6 cm)  BEHAVIORAL SYMPTOMS/MOOD NEUROLOGICAL BOWEL NUTRITION STATUS      Continent Diet(dys 2, thin liquids)  AMBULATORY STATUS COMMUNICATION OF NEEDS Skin   Extensive Assist Verbally PU Stage and Appropriate Care   PU Stage 2 Dressing: (Buttocks, foam dressing, PRN)                   Personal Care Assistance Level of Assistance  Bathing, Feeding, Dressing Bathing Assistance: Maximum assistance Feeding assistance: Independent Dressing Assistance: Maximum assistance     Functional Limitations Info  Sight, Hearing, Speech Sight Info: Adequate Hearing Info: Adequate Speech Info: Adequate    SPECIAL CARE FACTORS FREQUENCY  PT (By licensed PT), OT (By licensed OT)     PT Frequency: 2x OT Frequency: 2x            Contractures Contractures Info: Not present    Additional Factors Info  Code Status, Allergies Code Status Info: DNR Allergies Info: Codeine           Current Medications (01/25/2018):  This is the current hospital active medication list Current Facility-Administered Medications  Medication Dose Route Frequency Provider Last Rate  Last Dose  . bisacodyl (DULCOLAX) suppository 10 mg  10 mg Rectal BID Vena Rua, PA-C   10 mg at 01/24/18 2123  . feeding supplement (BOOST / RESOURCE BREEZE) liquid 1 Container  1 Container Oral TID BM McDiarmid, Blane Ohara, MD   1 Container at 01/25/18 0908  . morphine 2 MG/ML injection 0.5 mg  0.5 mg Intravenous Q2H PRN McDiarmid, Blane Ohara, MD    0.5 mg at 01/25/18 0322  . nebivolol (BYSTOLIC) tablet 5 mg  5 mg Oral Daily Bland, Scott, DO      . pantoprazole (PROTONIX) EC tablet 40 mg  40 mg Oral Q0600 Vena Rua, PA-C   40 mg at 01/25/18 0542  . polyethylene glycol (MIRALAX / GLYCOLAX) packet 17 g  17 g Oral BID Tammi Klippel, Sherin, DO   17 g at 01/25/18 0908  . senna-docusate (Senokot-S) tablet 1 tablet  1 tablet Oral BID Vena Rua, PA-C   1 tablet at 01/25/18 5625  . tamsulosin (FLOMAX) capsule 0.4 mg  0.4 mg Oral Daily Nicolette Bang, DO   0.4 mg at 01/25/18 6389     Discharge Medications: Please see discharge summary for a list of discharge medications.  Relevant Imaging Results:  Relevant Lab Results:   Additional Information SSN: [id number]  Eileen Stanford, LCSW

## 2018-01-25 NOTE — Progress Notes (Signed)
FPTS Interim Note:   Team was hopeful to discharge patient back to [facility] facility with hospice care today. Unfortunately, [facility] is unable to take patient with 15L NRB in place (they could take up to 10L) per CSW. Spoke with DIL [name] at length (out of patient's room per [name]'s request) regarding limitations on which facilities would take [name] at this point given his oxygen needs. She states she would not want him to go anywhere other than [facility] if he were going to ALF or SNF. She has had good experiences with [facility] inpatient hospice for friends. Per CSW, [facility] would be able to take 15L NRB. Medically, team feels patient has less than 2 week prognosis based on respiratory failure requiring NRB and inability to wean this despite diuresis, antibiotics. [name] is amenable to transfer to [facility] as bed becomes available. Let her know that this would be likely tomorrow or after. She will explain [name] to [name] herself tomorrow morning. [name] had questions about what type of care they would offer at [facility]. I explained that they would use only comfort therapies (liberalized diet if he desired, no IVFs, no antibiotics, no BP meds, morphine or other for air hunger, and lasix if for comfort). She voiced understanding with this plan.   Ralene Ok, MD

## 2018-01-26 DIAGNOSIS — I5030 Unspecified diastolic (congestive) heart failure: Secondary | ICD-10-CM

## 2018-01-26 DIAGNOSIS — I5031 Acute diastolic (congestive) heart failure: Secondary | ICD-10-CM

## 2018-01-26 NOTE — Progress Notes (Signed)
Remote pacemaker transmission.   

## 2018-01-26 NOTE — Progress Notes (Signed)
FPTS Interim Progress Note  Team was hopeful to discharge patient back to [facility] facility today. Per CSW [facility] will not accept patients on NRB mask and will take up to 6L on [place] only. Per CSW patient will need to go to residential hospice. Will inform Dr. Nori Riis so she can update Patient's DIL [name].   Caroline More, DO 01/26/2018, 2:52 PM PGY-1, [place] Medicine Service pager [phone]

## 2018-01-26 NOTE — Progress Notes (Signed)
Family Medicine Teaching Service Daily Progress Note Intern Pager: [phone]  Patient name: [name] Medical record number: [id number] Date of birth: [birth year] Age: 84 y.o. Gender: male  Primary Care Provider: Dickie La, MD Consultants: GI, curbside cardiology  Code Status: DNR  Pt Overview and Major Events to Date:  Admitted to [facility] on 01/14/18  Assessment and Plan: [name] a 84 y.o.malepresenting with SOB. PMH is significant forPAF, HTN, T2DM, sinus node dysfunction, CKD III.  Acute on chronic respiratory failure 2/2 CHF exacerbation+ Aspiration PNA WorseningO2 requirement (on RA at BL),satting96%on10L NRB. S/p IV lasix. Now on dysphagia II diet and eval by GI. Afebrile. Strep pneumo urinary antigen negative. Legionella antigen negative.S/p IV Vanc (3/2-3/4; 3/7-3/8 ), Cefepime (3/2 -3/4), Augmentin (2/28-3/2), unasyn (3/4-3/7), and meropenem (3/7-3/8 ). Medically, team feels patient has less than 2 week prognosis based on respiratory failure requiring NRB and inability to wean this despite diuresis, antibiotics.  -40 mg IV lasixprn -supplemental O2 -outpatientcardiology follow up -strict I/Os and daily weights -will give morphine q2h prn forrespiratory distress  Dysphagia in setting of likely aspiration pneumonia  Has had previous EGD showing smooth esophageal narrowing, and h/o multiple esophageal dilations in the past with a few weeks of improved swallowing, but no history of true stricture.Esophogram limited due to orthopnea and hypoxia if patient was laying flat. -Likelyetiology of aspiration pneumonia.  -GI consulted,appreciate recommendations -dysphagia IIdiet  -continue to work with SLP  Atrial fibrillation Chronic, stable. CHADSVasc score of 4 on admission.HASBLED score of 2.Previously on Xarelto for anticoagulationthis was DC'd prior to admitdue to advanced age and propensity to fall.Managed with dronedarone 400mg  tablet bid,  and nebivolol 5mg  daily.  -continue nebivolol5mg  daily  Sinus node dysfunction/av block/Pacemaker Patient with prolonged QT/qtc interval on past ekgs.QTc >550 on admission.He does have a pacemaker. -continue nebivolol5 mg daily -Avoid QT prolonging medications  Hypertension Current BP153/60.Home meds: nebivolol. -nebivolol5mg   CKD Stage III Cr of1.28on 3/8with GFR stable at46.BLCr 1.1-1.3. -no longer measuring labs per patient's family requests  Type II Diabetes Patient with past diagnosis of T2DM. Last A1c 5.8on 08/10/2017.Does not take any oral medication or insulin.Glucose in130  Sacral ulcer, stage 1 Stable.New stage 1 pressure ulcer, likely 2/2 decreased motility.  -will need frequent positional changes -apply foam dressing to relieve pressure -will change mattress to avoid increased pressure  Constipation Reports BM this morning  -soap suds enemaprn constipation -miralax daily  FEN/GI:Dysphagia 2 diet(NPO prior to esophogram), boost supplementation Prophylaxis:Lovenox  Disposition: hopeful for discharge to [facility] when bed available, medically ready for discharge   Subjective:  Patient states he is doing well. States breathing is doing well. Denies CP. Denies pain.   Objective: Temp:  [97.7 F (36.5 C)-98.4 F (36.9 C)] 97.7 F (36.5 C) (03/12 0606) Pulse Rate:  [69-81] 69 (03/12 0853) Resp:  [12] 12 (03/12 0606) BP: (121-135)/(53-54) 121/53 (03/12 0853) SpO2:  [96 %-100 %] 96 % (03/12 1201) Physical Exam: General: awake and alert, laying in bed, NRB in place. NAD  Cardiovascular: RRR, no MRG  Respiratory: CTAB, no crackles auscultated  Abdomen: soft, non tender, non distended, bowel sounds present  Extremities: no edema, non tender   Laboratory: Recent Labs  Lab 01/20/18 0439 01/21/18 0650 01/22/18 0420  WBC 18.1* 14.2* 16.7*  HGB 9.7* 9.3* 10.0*  HCT 30.0* 29.7* 31.7*  PLT 356 323 303   Recent Labs  Lab  01/20/18 0439 01/21/18 0650 01/22/18 0420  NA 139 140 141  K 3.5 3.1* 4.1  CL  100* 99* 99*  CO2 27 27 30   BUN 40* 44* 49*  CREATININE 1.33* 1.48* 1.28*  CALCIUM 8.0* 7.9* 7.8*  GLUCOSE 159* 263* 206Caroline More, DO 01/26/2018, 2:27 PM PGY-1, [place] Intern pager: [phone], text pages welcome

## 2018-01-27 ENCOUNTER — Encounter: Payer: Self-pay | Admitting: Cardiology

## 2018-01-27 NOTE — Progress Notes (Signed)
Patient will DC to: [facility] Anticipated DC date: 01/27/18 Family notified: [name] by: [facility] 3:30pm   Per MD patient ready for DC to Hospice. RN, patient, patient's family, and facility notified of DC. Discharge Summary sent to facility. RN given number for report [phone]). DC packet on chart. Ambulance transport requested for patient.   CSW signing off.  Cedric Fishman, LCSW Clinical Social Worker [phone]

## 2018-01-27 NOTE — Progress Notes (Signed)
CSW spoke with [name] to let her that patient is unable to go to SNF on non-rebreather. She reported understanding. CSW also checked with [facility] to see if they could accept patient on Hospice. They are unable to handle non-rebreather. [facility] does not have beds. CSW updated [name] and she is in agreement with [facility]. CSW sent referral for assessment.   Percell Locus Erby Sanderson LCSW [phone]

## 2018-01-27 NOTE — Progress Notes (Signed)
Pt prepared for d/c to [facility]. IV left in place, clean & intact. Skin intact except as charted in most recent assessments. Vitals are stable. Report called to receiving facility. Pt to be transported by ambulance service.

## 2018-01-27 NOTE — Progress Notes (Signed)
[facility]:  Met with pt and his daughter in law and son. Discussed hospice services and the pt moving to our [facility] for EOL care. They are in agreement with hospice philosophy. Spoke to Dr. Ander Purpura our MD and she does approve him to come to [facility]. [facility] called for pickup at 33 0pm by [facility] [place]. Thank you for this referral. Webb Silversmith RN [phone]

## 2018-01-27 NOTE — Progress Notes (Signed)
Family Medicine Teaching Service Daily Progress Note Intern Pager: [phone]  Patient name: [name] Medical record number: [id number] Date of birth: 1919-12-01 Age: 84 y.o. Gender: male  Primary Care Provider: Dickie La, MD Consultants: GI, curbside cardiology  Code Status: DNR  Pt Overview and Major Events to Date:  Admitted to [facility] on 01/14/18  Assessment and Plan: Samuel Loewen Melvinis a 84 y.o.malepresenting with SOB. PMH is significant forPAF, HTN, T2DM, sinus node dysfunction, CKD III.  Acute on chronic respiratory failure 2/2 CHF exacerbation+ Aspiration PNA WorseningO2 requirement (on RA at BL),satting100%on10L NRB. S/p IV lasix. Now on dysphagia II diet and eval by GI. Afebrile. Strep pneumo urinary antigen negative. Legionella antigen negative.S/p IV Vanc (3/2-3/4; 3/7-3/8 ), Cefepime (3/2 -3/4), Augmentin (2/28-3/2), unasyn (3/4-3/7), and meropenem (3/7-3/8 ). Medically, team feels patient has less than 2 week prognosis based on respiratory failure requiring NRB and inability to wean this despite diuresis, antibiotics.  -40 mg IV lasixprn -supplemental O2 -outpatientcardiology follow up -strict I/Os and daily weights -will give morphine q2h prn forrespiratory distress  Dysphagia in setting of likely aspiration pneumonia  Has had previous EGD showing smooth esophageal narrowing, and h/o multiple esophageal dilations in the past with a few weeks of improved swallowing, but no history of true stricture.Esophogram limited due to orthopnea and hypoxia if patient was laying flat. -Likelyetiology of aspiration pneumonia.  -GI consulted,appreciate recommendations -dysphagia IIdiet  -continue to work with SLP  Atrial fibrillation Chronic, stable. CHADSVasc score of 4 on admission.HASBLED score of 2.Previously on Xarelto for anticoagulationthis was DC'd prior to admitdue to advanced age and propensity to fall.Managed with dronedarone 400mg  tablet bid,  and nebivolol 5mg  daily.  -continue nebivolol5mg  daily  Sinus node dysfunction/av block/Pacemaker Patient with prolonged QT/qtc interval on past ekgs.QTc >550 on admission.He does have a pacemaker. -continue nebivolol5 mg daily -Avoid QT prolonging medications  Hypertension Current BP153/60.Home meds: nebivolol. -nebivolol5mg   CKD Stage III Cr of1.28on 3/8with GFR stable at46.BLCr 1.1-1.3. -no longer measuring labs per patient's family requests  Type II Diabetes Patient with past diagnosis of T2DM. Last A1c 5.8on 08/10/2017.Does not take any oral medication or insulin.Glucose in130  Sacral ulcer, stage 1 Stable.New stage 1 pressure ulcer, likely 2/2 decreased motility.  -will need frequent positional changes -apply foam dressing to relieve pressure -will change mattress to avoid increased pressure  Constipation H/o SBO and difficulty having daily BM -soap suds enemaprn constipation -miralax daily  FEN/GI:Dysphagia 2 diet(NPO prior to esophogram), boost supplementation Prophylaxis:Lovenox  Disposition: hopeful for discharge to [facility] when bed available, medically ready for discharge   Subjective:  Patient states he is doing well. States no problems with breathing. Per family friend patient "looks weak" today. Patient is speaking much more softly than previously.    Objective: Temp:  [96.7 F (35.9 C)-97.8 F (36.6 C)] 97.8 F (36.6 C) (03/13 0631) Pulse Rate:  [69-74] 69 (03/13 0631) Resp:  [17-18] 17 (03/13 0631) BP: (111-139)/(45-52) 139/52 (03/13 0631) SpO2:  [95 %-100 %] 100 % (03/13 0631) Physical Exam: General: awake and alert, sitting up in bed eating breakfast, NRB mask in place Cardiovascular: RRR, no MRG Respiratory: CTAB, no wheezes, rales, or rhonchi  Abdomen: soft, non tender, non distended, bowel sounds normal  Extremities: non tender, no edema   Laboratory: Recent Labs  Lab 01/21/18 0650 01/22/18 0420   WBC 14.2* 16.7*  HGB 9.3* 10.0*  HCT 29.7* 31.7*  PLT 323 303   Recent Labs  Lab 01/21/18 0650 01/22/18 0420  NA 140  141  K 3.1* 4.1  CL 99* 99*  CO2 27 30  BUN 44* 49*  CREATININE 1.48* 1.28*  CALCIUM 7.9* 7.8*  GLUCOSE 263* 206Caroline More, DO 01/27/2018, 12:19 PM PGY-1, [place] Intern pager: [phone], text pages welcome

## 2018-01-27 NOTE — Care Management Note (Addendum)
Case Management Note  Patient Details  Name: [name] MRN: [id number] Date of Birth: 10/03/1920  Subjective/Objective:    Presented with SOB. PMH is significant forPAF, HTN, T2DM, sinus node dysfunction, CKD III.  Hospital course pt with worseningO2 requirement, requiring NRB and inability to wean this despite diuresis,abx.  Action/Plan: Transition to residential hospice today. CSW managing disposition to facility.  Expected Discharge Date:  01/25/18               Expected Discharge Plan:  [place]  In-House Referral:  Clinical Social Work  Discharge planning Services  CM Consult    Status of Service:  Completed, signed off  If discussed at [employer] of Stay Meetings, dates discussed:    Additional Comments:  Sharin Mons, RN 01/27/2018, 12:38 PM

## 2018-01-28 LAB — CUP PACEART REMOTE DEVICE CHECK
Battery Impedance: 306 Ohm
Battery Voltage: 2.79 V
Brady Statistic AP VP Percent: 87 %
Brady Statistic AP VS Percent: 0 %
Brady Statistic AS VP Percent: 13 %
Implantable Lead Implant Date: 20061128
Implantable Lead Implant Date: 20061128
Implantable Lead Location: 753859
Implantable Lead Location: 753860
Implantable Lead Model: 4592
Implantable Lead Model: 5092
Lead Channel Pacing Threshold Amplitude: 0.5 V
Lead Channel Pacing Threshold Pulse Width: 0.4 ms
Lead Channel Setting Pacing Amplitude: 2.5 V
Lead Channel Setting Pacing Pulse Width: 0.4 ms
MDC IDC MSMT BATTERY REMAINING LONGEVITY: 90 mo
MDC IDC MSMT LEADCHNL RA IMPEDANCE VALUE: 492 Ohm
MDC IDC MSMT LEADCHNL RV IMPEDANCE VALUE: 588 Ohm
MDC IDC MSMT LEADCHNL RV PACING THRESHOLD AMPLITUDE: 0.625 V
MDC IDC MSMT LEADCHNL RV PACING THRESHOLD PULSEWIDTH: 0.4 ms
MDC IDC PG IMPLANT DT: 20150428
MDC IDC SESS DTM: 20190311163709
MDC IDC SET LEADCHNL RA PACING AMPLITUDE: 2 V
MDC IDC SET LEADCHNL RV SENSING SENSITIVITY: 4 mV
MDC IDC STAT BRADY AS VS PERCENT: 0 %

## 2018-04-26 ENCOUNTER — Telehealth: Payer: Self-pay

## 2018-04-26 ENCOUNTER — Ambulatory Visit (INDEPENDENT_AMBULATORY_CARE_PROVIDER_SITE_OTHER): Payer: Medicare Other | Admitting: *Deleted

## 2018-04-26 DIAGNOSIS — I495 Sick sinus syndrome: Secondary | ICD-10-CM

## 2018-04-26 NOTE — Telephone Encounter (Signed)
LMOVM reminding pt to send remote transmission.   

## 2018-04-27 ENCOUNTER — Encounter: Payer: Self-pay | Admitting: Cardiology

## 2018-04-27 ENCOUNTER — Emergency Department (HOSPITAL_COMMUNITY): Payer: Medicare Other

## 2018-04-27 ENCOUNTER — Other Ambulatory Visit: Payer: Self-pay

## 2018-04-27 ENCOUNTER — Inpatient Hospital Stay (HOSPITAL_COMMUNITY)
Admission: EM | Admit: 2018-04-27 | Discharge: 2018-05-08 | DRG: 388 | Disposition: A | Discharge status: Skilled Nursing Facility | Payer: Medicare Other | Attending: Family Medicine | Admitting: Family Medicine

## 2018-04-27 ENCOUNTER — Encounter (HOSPITAL_COMMUNITY): Payer: Self-pay | Admitting: Emergency Medicine

## 2018-04-27 DIAGNOSIS — R1310 Dysphagia, unspecified: Secondary | ICD-10-CM

## 2018-04-27 DIAGNOSIS — E870 Hyperosmolality and hypernatremia: Secondary | ICD-10-CM | POA: Diagnosis not present

## 2018-04-27 DIAGNOSIS — J690 Pneumonitis due to inhalation of food and vomit: Secondary | ICD-10-CM | POA: Diagnosis not present

## 2018-04-27 DIAGNOSIS — I480 Paroxysmal atrial fibrillation: Secondary | ICD-10-CM | POA: Diagnosis present

## 2018-04-27 DIAGNOSIS — I441 Atrioventricular block, second degree: Secondary | ICD-10-CM | POA: Diagnosis present

## 2018-04-27 DIAGNOSIS — N179 Acute kidney failure, unspecified: Secondary | ICD-10-CM | POA: Diagnosis present

## 2018-04-27 DIAGNOSIS — Z4659 Encounter for fitting and adjustment of other gastrointestinal appliance and device: Secondary | ICD-10-CM

## 2018-04-27 DIAGNOSIS — N3000 Acute cystitis without hematuria: Secondary | ICD-10-CM

## 2018-04-27 DIAGNOSIS — R1319 Other dysphagia: Secondary | ICD-10-CM

## 2018-04-27 DIAGNOSIS — Z87891 Personal history of nicotine dependence: Secondary | ICD-10-CM

## 2018-04-27 DIAGNOSIS — N401 Enlarged prostate with lower urinary tract symptoms: Secondary | ICD-10-CM | POA: Diagnosis present

## 2018-04-27 DIAGNOSIS — E872 Acidosis: Secondary | ICD-10-CM | POA: Diagnosis present

## 2018-04-27 DIAGNOSIS — A419 Sepsis, unspecified organism: Secondary | ICD-10-CM

## 2018-04-27 DIAGNOSIS — E1136 Type 2 diabetes mellitus with diabetic cataract: Secondary | ICD-10-CM | POA: Diagnosis present

## 2018-04-27 DIAGNOSIS — I2721 Secondary pulmonary arterial hypertension: Secondary | ICD-10-CM | POA: Diagnosis present

## 2018-04-27 DIAGNOSIS — Z885 Allergy status to narcotic agent status: Secondary | ICD-10-CM

## 2018-04-27 DIAGNOSIS — K219 Gastro-esophageal reflux disease without esophagitis: Secondary | ICD-10-CM | POA: Diagnosis present

## 2018-04-27 DIAGNOSIS — D519 Vitamin B12 deficiency anemia, unspecified: Secondary | ICD-10-CM | POA: Diagnosis present

## 2018-04-27 DIAGNOSIS — T17908A Unspecified foreign body in respiratory tract, part unspecified causing other injury, initial encounter: Secondary | ICD-10-CM

## 2018-04-27 DIAGNOSIS — R4181 Age-related cognitive decline: Secondary | ICD-10-CM | POA: Diagnosis present

## 2018-04-27 DIAGNOSIS — K5730 Diverticulosis of large intestine without perforation or abscess without bleeding: Secondary | ICD-10-CM | POA: Diagnosis present

## 2018-04-27 DIAGNOSIS — M109 Gout, unspecified: Secondary | ICD-10-CM | POA: Diagnosis present

## 2018-04-27 DIAGNOSIS — K56609 Unspecified intestinal obstruction, unspecified as to partial versus complete obstruction: Secondary | ICD-10-CM | POA: Diagnosis not present

## 2018-04-27 DIAGNOSIS — J309 Allergic rhinitis, unspecified: Secondary | ICD-10-CM | POA: Diagnosis present

## 2018-04-27 DIAGNOSIS — J189 Pneumonia, unspecified organism: Secondary | ICD-10-CM

## 2018-04-27 DIAGNOSIS — N136 Pyonephrosis: Secondary | ICD-10-CM | POA: Diagnosis present

## 2018-04-27 DIAGNOSIS — K449 Diaphragmatic hernia without obstruction or gangrene: Secondary | ICD-10-CM | POA: Diagnosis present

## 2018-04-27 DIAGNOSIS — I130 Hypertensive heart and chronic kidney disease with heart failure and stage 1 through stage 4 chronic kidney disease, or unspecified chronic kidney disease: Secondary | ICD-10-CM | POA: Diagnosis present

## 2018-04-27 DIAGNOSIS — Z66 Do not resuscitate: Secondary | ICD-10-CM | POA: Diagnosis present

## 2018-04-27 DIAGNOSIS — H409 Unspecified glaucoma: Secondary | ICD-10-CM | POA: Diagnosis present

## 2018-04-27 DIAGNOSIS — E440 Moderate protein-calorie malnutrition: Secondary | ICD-10-CM | POA: Diagnosis present

## 2018-04-27 DIAGNOSIS — I495 Sick sinus syndrome: Secondary | ICD-10-CM | POA: Diagnosis present

## 2018-04-27 DIAGNOSIS — Z6824 Body mass index (BMI) 24.0-24.9, adult: Secondary | ICD-10-CM

## 2018-04-27 DIAGNOSIS — Z0189 Encounter for other specified special examinations: Secondary | ICD-10-CM

## 2018-04-27 DIAGNOSIS — E1122 Type 2 diabetes mellitus with diabetic chronic kidney disease: Secondary | ICD-10-CM | POA: Diagnosis present

## 2018-04-27 DIAGNOSIS — N138 Other obstructive and reflux uropathy: Secondary | ICD-10-CM | POA: Diagnosis present

## 2018-04-27 DIAGNOSIS — Z96643 Presence of artificial hip joint, bilateral: Secondary | ICD-10-CM | POA: Diagnosis present

## 2018-04-27 DIAGNOSIS — Z800 Family history of malignant neoplasm of digestive organs: Secondary | ICD-10-CM

## 2018-04-27 DIAGNOSIS — K224 Dyskinesia of esophagus: Secondary | ICD-10-CM | POA: Diagnosis present

## 2018-04-27 DIAGNOSIS — Z85828 Personal history of other malignant neoplasm of skin: Secondary | ICD-10-CM

## 2018-04-27 DIAGNOSIS — L89154 Pressure ulcer of sacral region, stage 4: Secondary | ICD-10-CM | POA: Diagnosis present

## 2018-04-27 DIAGNOSIS — R1314 Dysphagia, pharyngoesophageal phase: Secondary | ICD-10-CM | POA: Diagnosis present

## 2018-04-27 DIAGNOSIS — Z807 Family history of other malignant neoplasms of lymphoid, hematopoietic and related tissues: Secondary | ICD-10-CM

## 2018-04-27 DIAGNOSIS — H9190 Unspecified hearing loss, unspecified ear: Secondary | ICD-10-CM | POA: Diagnosis present

## 2018-04-27 DIAGNOSIS — B964 Proteus (mirabilis) (morganii) as the cause of diseases classified elsewhere: Secondary | ICD-10-CM | POA: Diagnosis present

## 2018-04-27 DIAGNOSIS — Z820 Family history of epilepsy and other diseases of the nervous system: Secondary | ICD-10-CM

## 2018-04-27 DIAGNOSIS — Z86010 Personal history of colonic polyps: Secondary | ICD-10-CM

## 2018-04-27 DIAGNOSIS — D631 Anemia in chronic kidney disease: Secondary | ICD-10-CM | POA: Diagnosis present

## 2018-04-27 DIAGNOSIS — I5032 Chronic diastolic (congestive) heart failure: Secondary | ICD-10-CM | POA: Diagnosis present

## 2018-04-27 DIAGNOSIS — Z950 Presence of cardiac pacemaker: Secondary | ICD-10-CM

## 2018-04-27 DIAGNOSIS — L8990 Pressure ulcer of unspecified site, unspecified stage: Secondary | ICD-10-CM

## 2018-04-27 DIAGNOSIS — Z8249 Family history of ischemic heart disease and other diseases of the circulatory system: Secondary | ICD-10-CM

## 2018-04-27 DIAGNOSIS — N183 Chronic kidney disease, stage 3 (moderate): Secondary | ICD-10-CM | POA: Diagnosis present

## 2018-04-27 DIAGNOSIS — E860 Dehydration: Secondary | ICD-10-CM | POA: Diagnosis present

## 2018-04-27 LAB — COMPREHENSIVE METABOLIC PANEL
ALK PHOS: 174 U/L — AB (ref 38–126)
ALT: 62 U/L (ref 17–63)
AST: 34 U/L (ref 15–41)
Albumin: 2.3 g/dL — ABNORMAL LOW (ref 3.5–5.0)
Anion gap: 20 — ABNORMAL HIGH (ref 5–15)
BUN: 69 mg/dL — AB (ref 6–20)
CO2: 28 mmol/L (ref 22–32)
CREATININE: 3.22 mg/dL — AB (ref 0.61–1.24)
Calcium: 8.7 mg/dL — ABNORMAL LOW (ref 8.9–10.3)
Chloride: 83 mmol/L — ABNORMAL LOW (ref 101–111)
GFR, EST AFRICAN AMERICAN: 17 mL/min — AB (ref >60)
GFR, EST NON AFRICAN AMERICAN: 15 mL/min — AB (ref >60)
Glucose, Bld: 201 mg/dL — ABNORMAL HIGH (ref 65–99)
Potassium: 4.2 mmol/L (ref 3.5–5.1)
Sodium: 131 mmol/L — ABNORMAL LOW (ref 135–145)
Total Bilirubin: 0.6 mg/dL (ref 0.3–1.2)
Total Protein: 6.6 g/dL (ref 6.5–8.1)

## 2018-04-27 LAB — CUP PACEART REMOTE DEVICE CHECK
Battery Impedance: 330 Ohm
Battery Remaining Longevity: 91 mo
Battery Voltage: 2.79 V
Brady Statistic AP VP Percent: 73 %
Brady Statistic AP VS Percent: 0 %
Brady Statistic AS VP Percent: 23 %
Implantable Lead Implant Date: 20061128
Implantable Lead Implant Date: 20061128
Implantable Lead Location: 753859
Implantable Lead Model: 4592
Implantable Lead Model: 5092
Implantable Pulse Generator Implant Date: 20150428
Lead Channel Pacing Threshold Amplitude: 0.375 V
Lead Channel Pacing Threshold Pulse Width: 0.4 ms
Lead Channel Sensing Intrinsic Amplitude: 0.7 mV
Lead Channel Setting Pacing Amplitude: 2.5 V
Lead Channel Setting Pacing Pulse Width: 0.4 ms
MDC IDC LEAD LOCATION: 753860
MDC IDC MSMT LEADCHNL RA IMPEDANCE VALUE: 499 Ohm
MDC IDC MSMT LEADCHNL RV IMPEDANCE VALUE: 603 Ohm
MDC IDC MSMT LEADCHNL RV PACING THRESHOLD AMPLITUDE: 0.875 V
MDC IDC MSMT LEADCHNL RV PACING THRESHOLD PULSEWIDTH: 0.4 ms
MDC IDC SESS DTM: 20190611151146
MDC IDC SET LEADCHNL RA PACING AMPLITUDE: 2 V
MDC IDC SET LEADCHNL RV SENSING SENSITIVITY: 4 mV
MDC IDC STAT BRADY AS VS PERCENT: 4 %

## 2018-04-27 LAB — URINALYSIS, ROUTINE W REFLEX MICROSCOPIC
BACTERIA UA: NONE SEEN
BILIRUBIN URINE: NEGATIVE
Glucose, UA: NEGATIVE mg/dL
KETONES UR: NEGATIVE mg/dL
NITRITE: NEGATIVE
PROTEIN: 100 mg/dL — AB
RBC / HPF: >50 RBC/hpf — ABNORMAL HIGH (ref 0–5)
SPECIFIC GRAVITY, URINE: 1.015 (ref 1.005–1.030)
pH: 6 (ref 5.0–8.0)

## 2018-04-27 LAB — TYPE AND SCREEN
ABO/RH(D): O POS
Antibody Screen: NEGATIVE

## 2018-04-27 LAB — CBC
HCT: 32.1 % — ABNORMAL LOW (ref 39.0–52.0)
Hemoglobin: 10.4 g/dL — ABNORMAL LOW (ref 13.0–17.0)
MCH: 28 pg (ref 26.0–34.0)
MCHC: 32.4 g/dL (ref 30.0–36.0)
MCV: 86.5 fL (ref 78.0–100.0)
PLATELETS: 397 10*3/uL (ref 150–400)
RBC: 3.71 MIL/uL — AB (ref 4.22–5.81)
RDW: 17.4 % — AB (ref 11.5–15.5)
WBC: 23.9 10*3/uL — AB (ref 4.0–10.5)

## 2018-04-27 LAB — I-STAT TROPONIN, ED: TROPONIN I, POC: 0.02 ng/mL (ref 0.00–0.08)

## 2018-04-27 LAB — POC OCCULT BLOOD, ED: FECAL OCCULT BLD: NEGATIVE

## 2018-04-27 LAB — I-STAT CG4 LACTIC ACID, ED: Lactic Acid, Venous: 5.08 mmol/L (ref 0.5–1.9)

## 2018-04-27 LAB — LIPASE, BLOOD: Lipase: 17 U/L (ref 11–51)

## 2018-04-27 MED ORDER — PANTOPRAZOLE SODIUM 40 MG IV SOLR
40.0000 mg | Freq: Once | INTRAVENOUS | Status: AC
  Administered 2018-04-27: 40 mg via INTRAVENOUS
  Filled 2018-04-27: qty 40

## 2018-04-27 MED ORDER — SODIUM CHLORIDE 0.9 % IV BOLUS
1000.0000 mL | Freq: Once | INTRAVENOUS | Status: AC
  Administered 2018-04-27: 1000 mL via INTRAVENOUS

## 2018-04-27 MED ORDER — PIPERACILLIN-TAZOBACTAM 3.375 G IVPB
3.3750 g | Freq: Once | INTRAVENOUS | Status: AC
  Administered 2018-04-27: 3.375 g via INTRAVENOUS
  Filled 2018-04-27: qty 50

## 2018-04-27 NOTE — Progress Notes (Signed)
Remote pacemaker transmission.   

## 2018-04-27 NOTE — ED Notes (Signed)
Patient transported to CT 

## 2018-04-27 NOTE — ED Provider Notes (Addendum)
[facility] EMERGENCY DEPARTMENT Provider Note   CSN: [id number] Arrival date & time: 04/27/18  2111     History   Chief Complaint Chief Complaint  Patient presents with  . Abdominal Pain  . GI Bleeding    HPI [name] is a 84 y.o. male.  Patient with history of A. Fib not on anticoagulation, chronic kidney disease, heart failure, small bowel obstruction, diabetes, urinary obstruction with indwelling Foley catheter in the pacemaker, appendectomy, chronic sacral wound-- presents from [facility] with 3 days of malaise and decreased appetite with nausea and vomiting with brown emesis starting today at approximately 3 PM.  Patient was given Phenergan at the facility without improvement and he continued to vomit.  Foley catheter was reportedly changed today.  Patient reports generalized abdominal pain with the vomiting that is worse in the lower abdomen.  No fever or diarrhea reported.  No lower extremity swelling reported.  Patient denies chest pain or shortness of breath.  No cough. The onset of this condition was acute. The course is constant. Aggravating factors: none. Alleviating factors: none.       Past Medical History:  Diagnosis Date  . Acute gastritis without mention of hemorrhage   . Arthritis   . Atrial fibrillation ([place])   . Cancer ([place])    basal cell skin cancer removed  . Chronic kidney disease, stage III (moderate) (HCC)   . Constipation   . Diverticulosis   . Diverticulosis of colon (without mention of hemorrhage)   . Environmental allergies    HISTORY OF  . Esophageal dysmotility   . Family history of malignant neoplasm of gastrointestinal tract   . GERD (gastroesophageal reflux disease)   . Gout   . Heart failure with preserved ejection fraction ([place]) 01/16/2018  . Hiatal hernia   . History of skin cancer   . Hypertension   . Hypertrophy of prostate with urinary obstruction and other lower urinary tract symptoms (LUTS)   .  Overactive bladder   . Personal history of colonic polyps 10/21/2006   hyperplastic   . Presence of permanent cardiac pacemaker 10/14/05   medtronic Adapta  . Second degree AV block 11/29/2014  . Sinus node dysfunction (HCC)   . Small bowel obstruction ([place])   . Stricture and stenosis of esophagus   . Type II or unspecified type diabetes mellitus without mention of complication, not stated as uncontrolled    pt states that he checks his blood sugar once weekly, is not on oral meds or insulin  . Unspecified cataract   . Vitamin B12 deficiency     Patient Active Problem List   Diagnosis Date Noted  . Pulmonary artery hypertension ([place]) 01/16/2018  . Acute diastolic congestive heart failure ([place]) 01/16/2018  . Heart failure with preserved ejection fraction ([place]) 01/16/2018  . Hypoxemia 01/14/2018  . Esophageal dysmotility 01/14/2018  . Cough with sputum 01/14/2018  . Leukocytosis 01/14/2018  . Elevated brain natriuretic peptide (BNP) level 01/14/2018  . Esophageal dysphagia 01/14/2018  . Bilateral pulmonary infiltrates on chest x-ray 01/14/2018  . Prolonged Q-T interval on ECG 01/14/2018  . Aspiration pneumonia of both upper lobes due to regurgitated food ([place])   . Postop check   . Elective surgery   . Normocytic anemia   . Essential hypertension   . Second degree AV block 11/29/2014  . Abnormal esophagram 09/07/2014  . Dysphagia 08/23/2014  . Pacemaker - Medtronic Adapta 2006, gen change 2015 07/23/2013  . Chronic right hip  pain 09/22/2012  . Testicular swelling, right 09/22/2012  . Presbycusis 05/14/2012  . Paroxysmal atrial fibrillation ([place]) 01/30/2012  . Hx SBO 01/30/2012  . S/P appendectomy 01/30/2012  . CHRONIC KIDNEY DISEASE STAGE III (MODERATE) 12/03/2010  . ALLERGIC RHINITIS 09/05/2009  . HOARSENESS 05/15/2009  . HIATAL HERNIA 12/14/2008  . Diverticulosis of large intestine 12/14/2008  . COLONIC POLYPS, HYPERPLASTIC, HX OF 12/14/2008  . ESOPHAGEAL STRICTURE  10/04/2008  . Other vitamin B12 deficiency anemia 01/26/2008  . GLAUCOMA 01/14/2007  . CATARACT 01/14/2007  . SICK SINUS SYNDROME 01/14/2007  . GASTROESOPHAGEAL REFLUX, NO ESOPHAGITIS 01/14/2007    Past Surgical History:  Procedure Laterality Date  . APPENDECTOMY    . CARDIOVERSION  10/01/2011   Procedure: CARDIOVERSION;  Surgeon: Dani Gobble [name];  Location: [facility] OR;  Service: Cardiovascular;  Laterality: N/A;  OP CARDIOVERSION  . CARDIOVERSION  11/02/12   successful  . ESOPHAGOGASTRODUODENOSCOPY (EGD) WITH PROPOFOL N/A 09/22/2014   Procedure: ESOPHAGOGASTRODUODENOSCOPY (EGD) WITH PROPOFOL;  Surgeon: Jerene Bears, MD;  Location: [facility];  Service: Gastroenterology;  Laterality: N/A;  . EYE SURGERY Bilateral years ago   cataract lens replacment  . LAPAROSCOPY     for bowel obstruction  . PACEMAKER INSERTION  2006   MDT ADAPTA. s/p gen change 2015  . PERMANENT PACEMAKER GENERATOR CHANGE N/A 03/14/2014   MDT ADAPTA. Procedure: PERMANENT PACEMAKER GENERATOR CHANGE;  Surgeon: Sanda Klein, MD; Medtronic Adapta L model number [place], serial number [id number]   . PROSTATE SURGERY    . SAVORY DILATION N/A 09/22/2014   Procedure: SAVORY DILATION;  Surgeon: Jerene Bears, MD;  Location: [facility];  Service: Gastroenterology;  Laterality: N/A;  . TONSILLECTOMY    . TOTAL HIP ARTHROPLASTY Left   . TOTAL HIP ARTHROPLASTY Right 08/17/2017   Procedure: RIGHT TOTAL HIP ARTHROPLASTY ANTERIOR APPROACH;  Surgeon: Rod Can, MD;  Location: [facility];  Service: Orthopedics;  Laterality: Right;  Needs RNFA        Home Medications    Prior to Admission medications   Medication Sig Start Date End Date Taking? Authorizing Provider  bisacodyl (DULCOLAX) 10 MG suppository Place 1 suppository (10 mg total) rectally 2 (two) times daily. 01/25/18   Sherene Sires, DO  morphine 2 MG/ML injection Inject 0.25 mLs (0.5 mg total) into the vein every 2 (two) hours as needed (increased respiratory rate, pain,  respiratory hunger, distress, discomfort). 01/25/18   Sherene Sires, DO  nebivolol (BYSTOLIC) 5 MG tablet Take 1 tablet (5 mg total) by mouth daily. 12/09/17   [name], Mihai, MD  pantoprazole (PROTONIX) 40 MG tablet Take 1 tablet (40 mg total) by mouth daily before breakfast. 11/19/16   Dickie La, MD  polyethylene glycol (MIRALAX / Floria Raveling) packet Take 17 g by mouth daily. Patient taking differently: Take 17 g by mouth daily. Mix with 8 oz juice and drink 08/30/15   Haney, Alyssa A, MD  senna-docusate (SENOKOT-S) 8.6-50 MG tablet Take 1 tablet by mouth 2 (two) times daily. 01/25/18   Sherene Sires, DO  silodosin (RAPAFLO) 4 MG CAPS capsule Take 2 capsules (8 mg total) by mouth daily with breakfast. 02/06/17   Katheren Shams, DO    Family History Family History  Problem Relation Age of Onset  . Parkinsonism Mother   . Hypertension Father   . Colon cancer Brother 48  . Hypertension Brother   . Hodgkin's lymphoma Grandchild     Social History Social History   Tobacco Use  . Smoking status: Former Smoker  Packs/day: 1.00    Years: 20.00    Pack years: 20.00    Types: Cigarettes    Last attempt to quit: 11/17/1969    Years since quitting: 48.4  . Smokeless tobacco: Never Used  Substance Use Topics  . Alcohol use: No    Alcohol/week: 0.0 oz    Comment: none since 1990  . Drug use: No     Allergies   Codeine   Review of Systems Review of Systems  Constitutional: Positive for appetite change and fatigue. Negative for fever.  HENT: Negative for rhinorrhea and sore throat.   Eyes: Negative for redness.  Respiratory: Negative for cough.   Cardiovascular: Negative for chest pain.  Gastrointestinal: Positive for abdominal pain, nausea and vomiting. Negative for diarrhea.  Genitourinary: Negative for dysuria.  Musculoskeletal: Negative for myalgias.  Skin: Negative for rash.  Neurological: Negative for headaches.     Physical Exam Updated Vital Signs BP (!) 118/53    Pulse (!) 59   Temp 97.7 F (36.5 C) (Axillary)   Resp 16   Ht 5\' 6"  (1.676 m)   Wt 67.9 kg (149 lb 9.6 oz)   SpO2 100%   BMI 24.15 kg/m   Physical Exam  Constitutional: He appears well-developed and well-nourished. He appears distressed.  Patient actively vomiting.  HENT:  Head: Normocephalic and atraumatic.  Mouth/Throat: Oropharynx is clear and moist.  Eyes: Conjunctivae are normal. Right eye exhibits no discharge. Left eye exhibits no discharge.  Neck: Normal range of motion. Neck supple.  Cardiovascular: Normal rate, regular rhythm and normal heart sounds.  Pulmonary/Chest: Effort normal and breath sounds normal. No respiratory distress. He has no wheezes. He has no rales.  Abdominal: Soft. There is generalized tenderness. There is no rebound and no guarding.  Moderate generalized tenderness  Genitourinary: Rectum normal.  Genitourinary Comments: Patient with sacral wound covering in place, does not appear saturated.  Not removed. Mustard colored soft stool in rectum.   Musculoskeletal: He exhibits no edema or tenderness.  Neurological: He is alert.  Skin: Skin is warm and dry.  Psychiatric: He has a normal mood and affect.  Nursing note and vitals reviewed.    ED Treatments / Results  Labs (all labs ordered are listed, but only abnormal results are displayed) Labs Reviewed  COMPREHENSIVE METABOLIC PANEL - Abnormal; Notable for the following components:      Result Value   Sodium 131 (*)    Chloride 83 (*)    Glucose, Bld 201 (*)    BUN 69 (*)    Creatinine, Ser 3.22 (*)    Calcium 8.7 (*)    Albumin 2.3 (*)    Alkaline Phosphatase 174 (*)    GFR calc non Af Amer 15 (*)    GFR calc Af Amer 17 (*)    Anion gap 20 (*)    All other components within normal limits  CBC - Abnormal; Notable for the following components:   WBC 23.9 (*)    RBC 3.71 (*)    Hemoglobin 10.4 (*)    HCT 32.1 (*)    RDW 17.4 (*)    All other components within normal limits  URINALYSIS,  ROUTINE W REFLEX MICROSCOPIC - Abnormal; Notable for the following components:   APPearance TURBID (*)    Hgb urine dipstick MODERATE (*)    Protein, ur 100 (*)    Leukocytes, UA MODERATE (*)    RBC / HPF >50 (*)    WBC, UA >50 (*)  All other components within normal limits  I-STAT CG4 LACTIC ACID, ED - Abnormal; Notable for the following components:   Lactic Acid, Venous 5.08 (*)    All other components within normal limits  CULTURE, BLOOD (ROUTINE X 2)  CULTURE, BLOOD (ROUTINE X 2)  URINE CULTURE  LIPASE, BLOOD  POC OCCULT BLOOD, ED  I-STAT TROPONIN, ED  TYPE AND SCREEN    EKG None  ED ECG REPORT   Date: 04/27/2018  Rate: 101  Rhythm: atrial fibrillation  QRS Axis: normal  Intervals: normal  ST/T Wave abnormalities: nonspecific T wave changes  Conduction Disutrbances:nonspecific intraventricular conduction delay  Narrative Interpretation: no prolonged QTC  Old EKG Reviewed: unchanged  I have personally reviewed the EKG tracing and agree with the computerized printout as noted.   Radiology Ct Abdomen Pelvis Wo Contrast  Result Date: 04/27/2018 CLINICAL DATA:  84 year old with nausea and vomiting. EXAM: CT ABDOMEN AND PELVIS WITHOUT CONTRAST TECHNIQUE: Multidetector CT imaging of the abdomen and pelvis was performed following the standard protocol without IV contrast. COMPARISON:  CT 03/14/2017 FINDINGS: Lower chest: Emphysematous changes. Left lung base atelectasis heterogeneous pulmonary parenchyma. Pacemaker partially included. Hepatobiliary: No focal hepatic lesion. Gallbladder is distended without calcified stone. No pericholecystic inflammation. No biliary dilatation. Pancreas: Parenchymal atrophy. No ductal dilatation or inflammation. Spleen: Scattered granuloma. Normal in size. Splenule adjacent to the anterior spleen. Adrenals/Urinary Tract: No adrenal nodule. Mild right hydronephrosis and ureteral prominence. No left hydronephrosis. No perinephric edema. Foley  catheter within the urinary bladder. There is wall thickening and perivesicular edema about the bladder dome. Bladder diverticula on prior CT not demonstrated. Streak artifact from bilateral hip arthroplasties limits bowel evaluation. Stomach/Bowel: Fluid-filled dilated stomach with air-fluid level. Fluid in the distal esophagus. Fourth portion of the duodenum crosses the midline but jejunal bowel loops reside in the right abdomen suggesting a degree of congenital malrotation. This is unchanged from prior exam. Proximal small bowel are dilated and fluid-filled with transition point in the left abdomen consistent with small bowel obstruction. A more distal small bowel are decompressed. No pneumatosis, bowel wall thickening or mesenteric edema. Small to moderate colonic stool burden. Multifocal colonic diverticulosis, many of the colonic diverticula contain barium from prior exam. Mild fat stranding and fluid in the left pericolic gutter, axial image 51 series 3, may reflect mild diverticular inflammation. Pelvic bowel loops are obscured by streak artifact. Vascular/Lymphatic: Moderate aortic and branch atherosclerosis. No aneurysm. No adenopathy. Reproductive: Prostate gland is obscured by streak artifact from bilateral hip arthroplasties. Other: Minimal stranding and trace free fluid in the left pericolic gutter. No ascites. No free air. No intra-abdominal abscess. Musculoskeletal: Scoliosis and degenerative change in the spine. The bones are under mineralized. Bilateral hip arthroplasties. IMPRESSION: 1. Small-bowel obstruction with transition point in the left abdomen, likely due to adhesions. 2. Colonic diverticulosis. Minimal pericolonic fat stranding and trace fluid in the left pericolic gutter may reflect mild diverticular inflammation/diverticulitis. No perforation or abscess. 3. Mild right hydronephrosis and proximal ureter without cause obstruction. Findings may be seen with urinary tract infection. Bladder  decompressed by Foley catheter with diffuse bladder wall thickening and perivesicular edema. Recommend correlation with urinalysis. Electronically Signed   By: Jeb Levering M.D.   On: 04/27/2018 23:10   Dg Chest Portable 1 View  Result Date: 04/27/2018 CLINICAL DATA:  Abdominal pain and vomiting. EXAM: PORTABLE CHEST 1 VIEW COMPARISON:  Chest radiograph January 18, 2018 FINDINGS: Stable cardiomegaly. Mediastinal silhouette not suspicious. Calcified aortic arch. Mild chronic bronchitic changes, resolution  of alveolar airspace opacities from prior examination. No pleural effusion though RIGHT costophrenic angle incompletely imaged. Biapical pleural thickening. No pneumothorax. Dual lead cardiac pacemaker in situ. IMPRESSION: Stable cardiomegaly. Mild chronic bronchitic changes. Improved aeration. Aortic Atherosclerosis (ICD10-I70.0). Electronically Signed   By: Elon Alas M.D.   On: 04/27/2018 22:46    Procedures Procedures (including critical care time)  Medications Ordered in ED Medications  piperacillin-tazobactam (ZOSYN) IVPB 3.375 g (3.375 g Intravenous New Bag/Given 04/27/18 2233)  pantoprazole (PROTONIX) injection 40 mg (40 mg Intravenous Given 04/27/18 2301)  sodium chloride 0.9 % bolus 1,000 mL (1,000 mLs Intravenous New Bag/Given 04/27/18 2234)     Initial Impression / Assessment and Plan / ED Course  I have reviewed the triage vital signs and the nursing notes.  Pertinent labs & imaging results that were available during my care of the patient were reviewed by me and considered in my medical decision making (see chart for details).     Patient seen and examined. Actively vomiting. C/o abdominal pain.   Vital signs reviewed and are as follows: BP (!) 118/53   Pulse (!) 59   Temp 97.7 F (36.5 C) (Axillary)   Resp 16   Ht 5\' 6"  (1.676 m)   Wt 67.9 kg (149 lb 9.6 oz)   SpO2 100%   BMI 24.15 kg/m   Lactate 5. Zosyn ordered as well as 1L NS. Normal BP, HR. Temp 97.7  rectally. Stool mustard color. D/w Dr. Tamera Punt.   11:55 PM urine suggestive of UTI.  CT of abdomen demonstrates small bowel obstruction.  Patient updated.  I spoke with the patient's daughter about findings and plan for admission.  Patient is agreeable to NG tube.  He has had these in the past.  Patient's daughter will visit in the morning.  I spoke with family practice who will see patient and admit patient.  CRITICAL CARE Performed by: Carlisle Cater Total critical care time: 35 minutes Critical care time was exclusive of separately billable procedures and treating other patients. Critical care was necessary to treat or prevent imminent or life-threatening deterioration. Critical care was time spent personally by me on the following activities: development of treatment plan with patient and/or surrogate as well as nursing, discussions with consultants, evaluation of patient's response to treatment, examination of patient, obtaining history from patient or surrogate, ordering and performing treatments and interventions, ordering and review of laboratory studies, ordering and review of radiographic studies, pulse oximetry and re-evaluation of patient's condition.   Final Clinical Impressions(s) / ED Diagnoses   Final diagnoses:  Small bowel obstruction ([place])  Acute cystitis without hematuria  Sepsis, due to unspecified organism ([place])  Acute kidney injury ([place])   Admit, sepsis due to UTI, dehydration and acute kidney injury due to vomiting from small bowel obstruction.  ED Discharge Orders    None       Carlisle Cater, Hershal Coria 04/27/18 2356    Malvin Johns, MD 04/28/18 0004    Carlisle Cater, PA-C 06/20/18 [id number]    Malvin Johns, MD 06/20/18 [phone]

## 2018-04-27 NOTE — ED Triage Notes (Signed)
Pt BIB EMS from [facility]. Facility staff report onset of N/V around 1500 today with "brown emesis". Given phenergan at SNF with no improvement. Hx of indwelling foley catheter that was changed today d/t reported blockage. Known pressure sore to sacrum. Decr'd appetite for last 3 days.

## 2018-04-27 NOTE — H&P (Addendum)
[facility] Admission History and Physical Service Pager: [phone]  Patient name: [name] Medical record number: [id number] Date of birth: 07-12-1920 Age: 84 y.o. Gender: male  Primary Care Provider: Dickie La, MD Consultants: none Code Status: DNR/DNI  Chief Complaint: abdominal pain  Assessment and Plan: [name] is a 84 y.o. male presenting with abdominal pain. PMH is significant for PAF not on anticoagulation, HTN, T2DM, sinus node dysfunction, CKD III, previous SBO, GERD, diastolic CHF with pulmonary hypertension.  Abdominal pain. Due to SBO with possible diverticulitis. Patient has history of previous SBO requiring NG tube, is presenting with abdominal pain and emesis.  CT abdomen showing small bowel obstruction and colonic diverticulosis, possible diverculitis.  Differential also includes UTI as urinalysis showing moderate leukocytes moderate hemoglobin with many RBCs and patient has leukocytosis W BC 23.9 and lactic acidosis of 5 though he is afebrile. UTI is less likely as patient is probably chronic colonizer. Chest x-ray only showing chronic mild bronchitic changes so unlikely to also have a pneumonia especially since he does not have shortness of breath though lung exam is notable for some coarse breath sounds.  Appears euvolemic so will hold off on IV hydration given his HFpEF with pulm HTN -Admit to inpatient, attending Dr. Andria Frames -Keep patient NPO with NG tube for decompression -IV zosyn started in ED (6/11-), continue -Follow-up on urine culture -Monitor fluid status, may need to start gentle IV hydration if n.p.o. for prolonged period of time -PRN Phenergan -trend lactic acid  AKI on CKD. Cr 3.22 (BL 1.3).  Likely in the setting of UTI. -Monitor creatinine -Avoid nephrotoxic agents  Diabetes.  Last A1c 5.8 on 08/10/2017. -Monitor CBGs -sSSI  HFpEF with pulm HTN. Stable  BP on admit 111/63 -Holding home spironolactone and  lasix for AKI -Monitor blood pressure   FEN/GI: NPO, ADAT Prophylaxis:  Heparin SQ  Disposition: Admit to inpatient  History of Present Illness:  [name] is a 84 y.o. male presenting with abdominal pain.  Patient comes from SNF with 3 days of decreased p.o. intake, nausea, abdominal pain, vomiting with brown emesis.  He states that his abdominal pain feels like a soreness worse in the lower quadrants.  He also states that he has had some subjective fevers over the last couple of days but denies dysuria though he does have an indwelling Foley.  No chills, chest pain, palpitations, shortness of breath, cough, hematuria, diarrhea.  He does report some constipation last bowel movement was a few days ago he thinks but cannot be sure due to his memory problems.  Review Of Systems: Per HPI with the following additions:   Review of Systems  Constitutional: Positive for fever and malaise/fatigue. Negative for chills and diaphoresis.  Respiratory: Negative for cough and shortness of breath.   Cardiovascular: Negative for chest pain and palpitations.  Gastrointestinal: Positive for abdominal pain, constipation, nausea and vomiting. Negative for diarrhea.  Genitourinary: Negative for dysuria, frequency and urgency.  Neurological: Negative for dizziness and headaches.    Patient Active Problem List   Diagnosis Date Noted  . Pulmonary artery hypertension ([place]) 01/16/2018  . Acute diastolic congestive heart failure ([place]) 01/16/2018  . Heart failure with preserved ejection fraction ([place]) 01/16/2018  . Hypoxemia 01/14/2018  . Esophageal dysmotility 01/14/2018  . Cough with sputum 01/14/2018  . Leukocytosis 01/14/2018  . Elevated brain natriuretic peptide (BNP) level 01/14/2018  . Esophageal dysphagia 01/14/2018  . Bilateral pulmonary infiltrates on chest x-ray 01/14/2018  .  Prolonged Q-T interval on ECG 01/14/2018  . Aspiration pneumonia of both upper lobes due to regurgitated food ([place])    . Postop check   . Elective surgery   . Normocytic anemia   . Essential hypertension   . Second degree AV block 11/29/2014  . Abnormal esophagram 09/07/2014  . Dysphagia 08/23/2014  . Pacemaker - Medtronic Adapta 2006, gen change 2015 07/23/2013  . Chronic right hip pain 09/22/2012  . Testicular swelling, right 09/22/2012  . Presbycusis 05/14/2012  . Paroxysmal atrial fibrillation ([place]) 01/30/2012  . Hx SBO 01/30/2012  . S/P appendectomy 01/30/2012  . CHRONIC KIDNEY DISEASE STAGE III (MODERATE) 12/03/2010  . ALLERGIC RHINITIS 09/05/2009  . HOARSENESS 05/15/2009  . HIATAL HERNIA 12/14/2008  . Diverticulosis of large intestine 12/14/2008  . COLONIC POLYPS, HYPERPLASTIC, HX OF 12/14/2008  . ESOPHAGEAL STRICTURE 10/04/2008  . Other vitamin B12 deficiency anemia 01/26/2008  . GLAUCOMA 01/14/2007  . CATARACT 01/14/2007  . SICK SINUS SYNDROME 01/14/2007  . GASTROESOPHAGEAL REFLUX, NO ESOPHAGITIS 01/14/2007    Past Medical History: Past Medical History:  Diagnosis Date  . Acute gastritis without mention of hemorrhage   . Arthritis   . Atrial fibrillation ([place])   . Cancer ([place])    basal cell skin cancer removed  . Chronic kidney disease, stage III (moderate) (H[facility])   . Constipation   . Diverticulosis   . Diverticulosis of colon (without mention of hemorrhage)   . Environmental allergies    HISTORY OF  . Esophageal dysmotility   . Family history of malignant neoplasm of gastrointestinal tract   . GERD (gastroesophageal reflux disease)   . Gout   . Heart failure with preserved ejection fraction ([place]) 01/16/2018  . Hiatal hernia   . History of skin cancer   . Hypertension   . Hypertrophy of prostate with urinary obstruction and other lower urinary tract symptoms (LUTS)   . Overactive bladder   . Personal history of colonic polyps 10/21/2006   hyperplastic   . Presence of permanent cardiac pacemaker 10/14/05   medtronic Adapta  . Second degree AV block 11/29/2014  . Sinus  node dysfunction (HCC)   . Small bowel obstruction ([place])   . Stricture and stenosis of esophagus   . Type II or unspecified type diabetes mellitus without mention of complication, not stated as uncontrolled    pt states that he checks his blood sugar once weekly, is not on oral meds or insulin  . Unspecified cataract   . Vitamin B12 deficiency     Past Surgical History: Past Surgical History:  Procedure Laterality Date  . APPENDECTOMY    . CARDIOVERSION  10/01/2011   Procedure: CARDIOVERSION;  Surgeon: Dani Gobble [name];  Location: [facility] OR;  Service: Cardiovascular;  Laterality: N/A;  OP CARDIOVERSION  . CARDIOVERSION  11/02/12   successful  . ESOPHAGOGASTRODUODENOSCOPY (EGD) WITH PROPOFOL N/A 09/22/2014   Procedure: ESOPHAGOGASTRODUODENOSCOPY (EGD) WITH PROPOFOL;  Surgeon: Jerene Bears, MD;  Location: [facility] END[facility]COPY;  Service: Gastroenterology;  Laterality: N/A;  . EYE SURGERY Bilateral years ago   cataract lens replacment  . LAPAROSCOPY     for bowel obstruction  . PACEMAKER INSERTION  2006   MDT ADAPTA. s/p gen change 2015  . PERMANENT PACEMAKER GENERATOR CHANGE N/A 03/14/2014   MDT ADAPTA. Procedure: PERMANENT PACEMAKER GENERATOR CHANGE;  Surgeon: Sanda Klein, MD; Medtronic Adapta L model number [place], serial number [id number]   . PROSTATE SURGERY    . SAVORY DILATION N/A 09/22/2014   Procedure: SAVORY DILATION;  Surgeon:  Jerene Bears, MD;  Location: [facility];  Service: Gastroenterology;  Laterality: N/A;  . TONSILLECTOMY    . TOTAL HIP ARTHROPLASTY Left   . TOTAL HIP ARTHROPLASTY Right 08/17/2017   Procedure: RIGHT TOTAL HIP ARTHROPLASTY ANTERIOR APPROACH;  Surgeon: Rod Can, MD;  Location: [facility];  Service: Orthopedics;  Laterality: Right;  Needs RNFA    Social History: Social History   Tobacco Use  . Smoking status: Former Smoker    Packs/day: 1.00    Years: 20.00    Pack years: 20.00    Types: Cigarettes    Last attempt to quit: 11/17/1969    Years since  quitting: 48.4  . Smokeless tobacco: Never Used  Substance Use Topics  . Alcohol use: No    Alcohol/week: 0.0 oz    Comment: none since 1990  . Drug use: No   Additional social history: Lives at SNF  Please also refer to relevant sections of EMR.  Family History: Family History  Problem Relation Age of Onset  . Parkinsonism Mother   . Hypertension Father   . Colon cancer Brother 45  . Hypertension Brother   . Hodgkin's lymphoma Grandchild     Allergies and Medications: Allergies  Allergen Reactions  . Codeine Nausea Only   No current facility-administered medications on file prior to encounter.    Current Outpatient Medications on File Prior to Encounter  Medication Sig Dispense Refill  . acetaminophen (TYLENOL) 325 MG tablet Take 650 mg by mouth every 4 (four) hours as needed for fever.    Marland Kitchen acetaminophen (TYLENOL) 650 MG suppository Place 650 mg rectally every 4 (four) hours as needed for fever.    . Amino Acids-Protein Hydrolys (FEEDING SUPPLEMENT, PRO-STAT SUGAR FREE 64,) LIQD Take 30 mLs by mouth daily.    . bisacodyl (DULCOLAX) 10 MG suppository Place 1 suppository (10 mg total) rectally 2 (two) times daily. (Patient taking differently: Place 10 mg rectally daily as needed for mild constipation. ) 12 suppository 0  . docusate sodium (COLACE) 100 MG capsule Take 100 mg by mouth daily as needed for mild constipation.    . fentaNYL (DURAGESIC - DOSED MCG/HR) 12 MCG/HR Place 12.5 mcg onto the skin every 3 (three) days.    . furosemide (LASIX) 40 MG tablet Take 40 mg by mouth 2 (two) times daily.    Marland Kitchen HYDROmorphone (DILAUDID) 2 MG tablet Take 2 mg by mouth every 4 (four) hours as needed for severe pain.    . Multiple Vitamin (MULTIVITAMIN WITH MINERALS) TABS tablet Take 1 tablet by mouth daily.    . polyethylene glycol (MIRALAX / GLYCOLAX) packet Take 17 g by mouth daily. 14 each 0  . promethazine (PHENERGAN) 25 MG suppository Place 25 mg rectally every 12 (twelve) hours as  needed for nausea or vomiting.    . senna-docusate (SENOKOT-S) 8.6-50 MG tablet Take 1 tablet by mouth 2 (two) times daily. (Patient taking differently: Take 2 tablets by mouth daily. ) 30 tablet 0  . spironolactone (ALDACTONE) 25 MG tablet Take 25 mg by mouth daily.    . magnesium hydroxide (MILK OF MAGNESIA) 400 MG/5ML suspension Take 30 mLs by mouth once.    . morphine 2 MG/ML injection Inject 0.25 mLs (0.5 mg total) into the vein every 2 (two) hours as needed (increased respiratory rate, pain, respiratory hunger, distress, discomfort). (Patient not taking: Reported on 04/27/2018) 1 mL 0  . nebivolol (BYSTOLIC) 5 MG tablet Take 1 tablet (5 mg total) by mouth daily. (Patient  not taking: Reported on 04/27/2018) 90 tablet 3  . pantoprazole (PROTONIX) 40 MG tablet Take 1 tablet (40 mg total) by mouth daily before breakfast. (Patient not taking: Reported on 04/27/2018) 90 tablet 3  . silodosin (RAPAFLO) 4 MG CAPS capsule Take 2 capsules (8 mg total) by mouth daily with breakfast. (Patient not taking: Reported on 04/27/2018) 135 capsule 3    Objective: BP 105/65   Pulse 70   Temp (!) 97.5 F (36.4 C) (Rectal)   Resp 16   Ht 5\' 6"  (1.676 m)   Wt 67.9 kg (149 lb 9.6 oz)   SpO2 100%   BMI 24.15 kg/m  Exam: General: Lying in bed comfortably, in no acute distress Eyes: L pupil reactive, R pupil coloboma ENTM: Moist mucous membranes, oropharynx clear Neck: Supple, no JVD Cardiovascular: Regular rate and rhythm, S1-S2, no murmurs appreciated Respiratory: Coarse breath sounds throughout all lung fields, normal work of breathing without accessory muscle use.  No wheezes. Gastrointestinal: Tender to palpation over the lower abdominal areas, soft, hypoactive bowel sounds MSK: Moves all limbs spontaneously, no lower extremity edema Derm: Warm and dry Neuro: Alert.  Oriented to person place and time Psych: Appropriate mood and affect  Labs and Imaging: CBC BMET  Recent Labs  Lab 04/27/18 2125  WBC  23.9*  HGB 10.4*  HCT 32.1*  PLT 397   Recent Labs  Lab 04/27/18 2125  NA 131*  K 4.2  CL 83*  CO2 28  BUN 69*  CREATININE 3.22*  GLUCOSE 201*  CALCIUM 8.7*     Troponin (Point of Care Test) Recent Labs    04/27/18 2207  TROPIPOC 0.02    Lactic Acid, Venous    Component Value Date/Time   LATICACIDVEN 5.08 (HH) 04/27/2018 2209    Ct Abdomen Pelvis Wo Contrast  Result Date: 04/27/2018 CLINICAL DATA:  84 year old with nausea and vomiting. EXAM: CT ABDOMEN AND PELVIS WITHOUT CONTRAST TECHNIQUE: Multidetector CT imaging of the abdomen and pelvis was performed following the standard protocol without IV contrast. COMPARISON:  CT 03/14/2017 FINDINGS: Lower chest: Emphysematous changes. Left lung base atelectasis heterogeneous pulmonary parenchyma. Pacemaker partially included. Hepatobiliary: No focal hepatic lesion. Gallbladder is distended without calcified stone. No pericholecystic inflammation. No biliary dilatation. Pancreas: Parenchymal atrophy. No ductal dilatation or inflammation. Spleen: Scattered granuloma. Normal in size. Splenule adjacent to the anterior spleen. Adrenals/Urinary Tract: No adrenal nodule. Mild right hydronephrosis and ureteral prominence. No left hydronephrosis. No perinephric edema. Foley catheter within the urinary bladder. There is wall thickening and perivesicular edema about the bladder dome. Bladder diverticula on prior CT not demonstrated. Streak artifact from bilateral hip arthroplasties limits bowel evaluation. Stomach/Bowel: Fluid-filled dilated stomach with air-fluid level. Fluid in the distal esophagus. Fourth portion of the duodenum crosses the midline but jejunal bowel loops reside in the right abdomen suggesting a degree of congenital malrotation. This is unchanged from prior exam. Proximal small bowel are dilated and fluid-filled with transition point in the left abdomen consistent with small bowel obstruction. A more distal small bowel are  decompressed. No pneumatosis, bowel wall thickening or mesenteric edema. Small to moderate colonic stool burden. Multifocal colonic diverticulosis, many of the colonic diverticula contain barium from prior exam. Mild fat stranding and fluid in the left pericolic gutter, axial image 51 series 3, may reflect mild diverticular inflammation. Pelvic bowel loops are obscured by streak artifact. Vascular/Lymphatic: Moderate aortic and branch atherosclerosis. No aneurysm. No adenopathy. Reproductive: Prostate gland is obscured by streak artifact from bilateral hip arthroplasties. Other:  Minimal stranding and trace free fluid in the left pericolic gutter. No ascites. No free air. No intra-abdominal abscess. Musculoskeletal: Scoliosis and degenerative change in the spine. The bones are under mineralized. Bilateral hip arthroplasties. IMPRESSION: 1. Small-bowel obstruction with transition point in the left abdomen, likely due to adhesions. 2. Colonic diverticulosis. Minimal pericolonic fat stranding and trace fluid in the left pericolic gutter may reflect mild diverticular inflammation/diverticulitis. No perforation or abscess. 3. Mild right hydronephrosis and proximal ureter without cause obstruction. Findings may be seen with urinary tract infection. Bladder decompressed by Foley catheter with diffuse bladder wall thickening and perivesicular edema. Recommend correlation with urinalysis. Electronically Signed   By: Jeb Levering M.D.   On: 04/27/2018 23:10   Dg Chest Portable 1 View  Result Date: 04/27/2018 CLINICAL DATA:  Abdominal pain and vomiting. EXAM: PORTABLE CHEST 1 VIEW COMPARISON:  Chest radiograph January 18, 2018 FINDINGS: Stable cardiomegaly. Mediastinal silhouette not suspicious. Calcified aortic arch. Mild chronic bronchitic changes, resolution of alveolar airspace opacities from prior examination. No pleural effusion though RIGHT costophrenic angle incompletely imaged. Biapical pleural thickening. No  pneumothorax. Dual lead cardiac pacemaker in situ. IMPRESSION: Stable cardiomegaly. Mild chronic bronchitic changes. Improved aeration. Aortic Atherosclerosis (ICD10-I70.0). Electronically Signed   By: Elon Alas M.D.   On: 04/27/2018 22:46    Bufford Lope, DO 04/27/2018, 11:32 PM PGY-2, [facility] Intern pager: [phone], text pages welcome

## 2018-04-28 ENCOUNTER — Inpatient Hospital Stay (HOSPITAL_COMMUNITY): Payer: Medicare Other

## 2018-04-28 DIAGNOSIS — Z950 Presence of cardiac pacemaker: Secondary | ICD-10-CM | POA: Diagnosis not present

## 2018-04-28 DIAGNOSIS — K449 Diaphragmatic hernia without obstruction or gangrene: Secondary | ICD-10-CM | POA: Diagnosis present

## 2018-04-28 DIAGNOSIS — A419 Sepsis, unspecified organism: Secondary | ICD-10-CM

## 2018-04-28 DIAGNOSIS — K224 Dyskinesia of esophagus: Secondary | ICD-10-CM | POA: Diagnosis not present

## 2018-04-28 DIAGNOSIS — M109 Gout, unspecified: Secondary | ICD-10-CM | POA: Diagnosis present

## 2018-04-28 DIAGNOSIS — E870 Hyperosmolality and hypernatremia: Secondary | ICD-10-CM | POA: Diagnosis not present

## 2018-04-28 DIAGNOSIS — N3000 Acute cystitis without hematuria: Secondary | ICD-10-CM

## 2018-04-28 DIAGNOSIS — I441 Atrioventricular block, second degree: Secondary | ICD-10-CM | POA: Diagnosis present

## 2018-04-28 DIAGNOSIS — E872 Acidosis: Secondary | ICD-10-CM | POA: Diagnosis present

## 2018-04-28 DIAGNOSIS — J690 Pneumonitis due to inhalation of food and vomit: Secondary | ICD-10-CM

## 2018-04-28 DIAGNOSIS — L89309 Pressure ulcer of unspecified buttock, unspecified stage: Secondary | ICD-10-CM | POA: Diagnosis not present

## 2018-04-28 DIAGNOSIS — R1310 Dysphagia, unspecified: Secondary | ICD-10-CM | POA: Diagnosis not present

## 2018-04-28 DIAGNOSIS — N179 Acute kidney failure, unspecified: Secondary | ICD-10-CM | POA: Diagnosis present

## 2018-04-28 DIAGNOSIS — E1122 Type 2 diabetes mellitus with diabetic chronic kidney disease: Secondary | ICD-10-CM | POA: Diagnosis present

## 2018-04-28 DIAGNOSIS — I5032 Chronic diastolic (congestive) heart failure: Secondary | ICD-10-CM | POA: Diagnosis present

## 2018-04-28 DIAGNOSIS — Z800 Family history of malignant neoplasm of digestive organs: Secondary | ICD-10-CM | POA: Diagnosis not present

## 2018-04-28 DIAGNOSIS — N136 Pyonephrosis: Secondary | ICD-10-CM | POA: Diagnosis present

## 2018-04-28 DIAGNOSIS — K219 Gastro-esophageal reflux disease without esophagitis: Secondary | ICD-10-CM | POA: Diagnosis present

## 2018-04-28 DIAGNOSIS — K56609 Unspecified intestinal obstruction, unspecified as to partial versus complete obstruction: Secondary | ICD-10-CM | POA: Diagnosis present

## 2018-04-28 DIAGNOSIS — I130 Hypertensive heart and chronic kidney disease with heart failure and stage 1 through stage 4 chronic kidney disease, or unspecified chronic kidney disease: Secondary | ICD-10-CM | POA: Diagnosis present

## 2018-04-28 DIAGNOSIS — N138 Other obstructive and reflux uropathy: Secondary | ICD-10-CM | POA: Diagnosis present

## 2018-04-28 DIAGNOSIS — E440 Moderate protein-calorie malnutrition: Secondary | ICD-10-CM | POA: Diagnosis present

## 2018-04-28 DIAGNOSIS — R1314 Dysphagia, pharyngoesophageal phase: Secondary | ICD-10-CM | POA: Diagnosis present

## 2018-04-28 DIAGNOSIS — Z85828 Personal history of other malignant neoplasm of skin: Secondary | ICD-10-CM | POA: Diagnosis not present

## 2018-04-28 DIAGNOSIS — L8990 Pressure ulcer of unspecified site, unspecified stage: Secondary | ICD-10-CM

## 2018-04-28 DIAGNOSIS — N183 Chronic kidney disease, stage 3 (moderate): Secondary | ICD-10-CM | POA: Diagnosis present

## 2018-04-28 DIAGNOSIS — L89154 Pressure ulcer of sacral region, stage 4: Secondary | ICD-10-CM | POA: Diagnosis present

## 2018-04-28 DIAGNOSIS — I495 Sick sinus syndrome: Secondary | ICD-10-CM | POA: Diagnosis present

## 2018-04-28 DIAGNOSIS — Z86010 Personal history of colonic polyps: Secondary | ICD-10-CM | POA: Diagnosis not present

## 2018-04-28 DIAGNOSIS — E1136 Type 2 diabetes mellitus with diabetic cataract: Secondary | ICD-10-CM | POA: Diagnosis present

## 2018-04-28 LAB — HEMOGLOBIN A1C
Hgb A1c MFr Bld: 5.8 % — ABNORMAL HIGH (ref 4.8–5.6)
Mean Plasma Glucose: 119.76 mg/dL

## 2018-04-28 LAB — LACTIC ACID, PLASMA
LACTIC ACID, VENOUS: 2.7 mmol/L — AB (ref 0.5–1.9)
LACTIC ACID, VENOUS: 2.1 mmol/L — AB (ref 0.5–1.9)

## 2018-04-28 LAB — BASIC METABOLIC PANEL
Anion gap: 13 (ref 5–15)
BUN: 71 mg/dL — AB (ref 6–20)
CALCIUM: 7.9 mg/dL — AB (ref 8.9–10.3)
CO2: 27 mmol/L (ref 22–32)
CREATININE: 2.96 mg/dL — AB (ref 0.61–1.24)
Chloride: 92 mmol/L — ABNORMAL LOW (ref 101–111)
GFR calc Af Amer: 19 mL/min — ABNORMAL LOW (ref >60)
GFR calc non Af Amer: 17 mL/min — ABNORMAL LOW (ref >60)
GLUCOSE: 147 mg/dL — AB (ref 65–99)
Potassium: 4 mmol/L (ref 3.5–5.1)
Sodium: 132 mmol/L — ABNORMAL LOW (ref 135–145)

## 2018-04-28 LAB — MRSA PCR SCREENING: MRSA by PCR: POSITIVE — AB

## 2018-04-28 LAB — GLUCOSE, CAPILLARY
GLUCOSE-CAPILLARY: 83 mg/dL (ref 65–99)
GLUCOSE-CAPILLARY: 109 mg/dL — AB (ref 65–99)
GLUCOSE-CAPILLARY: 90 mg/dL (ref 65–99)
Glucose-Capillary: 152 mg/dL — ABNORMAL HIGH (ref 65–99)
Glucose-Capillary: 124 mg/dL — ABNORMAL HIGH (ref 65–99)

## 2018-04-28 LAB — CBC
HCT: 26.6 % — ABNORMAL LOW (ref 39.0–52.0)
Hemoglobin: 8.6 g/dL — ABNORMAL LOW (ref 13.0–17.0)
MCH: 27.8 pg (ref 26.0–34.0)
MCHC: 32.3 g/dL (ref 30.0–36.0)
MCV: 86.1 fL (ref 78.0–100.0)
Platelets: 344 10*3/uL (ref 150–400)
RBC: 3.09 MIL/uL — ABNORMAL LOW (ref 4.22–5.81)
RDW: 17.6 % — AB (ref 11.5–15.5)
WBC: 19.3 10*3/uL — ABNORMAL HIGH (ref 4.0–10.5)

## 2018-04-28 MED ORDER — ACETAMINOPHEN 650 MG RE SUPP: 650.0000 mg | Freq: Four times a day (QID) | RECTAL | Status: DC | PRN

## 2018-04-28 MED ORDER — SODIUM CHLORIDE 0.9 % IV SOLN: INTRAVENOUS | Status: DC

## 2018-04-28 MED ORDER — PRO-STAT SUGAR FREE PO LIQD
30.0000 mL | Freq: Every day | ORAL | Status: DC
  Administered 2018-05-01 – 2018-05-07 (×7): 30 mL via ORAL
  Filled 2018-04-28 (×8): qty 30

## 2018-04-28 MED ORDER — ACETAMINOPHEN 325 MG PO TABS
650.0000 mg | ORAL_TABLET | Freq: Four times a day (QID) | ORAL | Status: DC | PRN
  Administered 2018-05-01 – 2018-05-08 (×2): 650 mg via ORAL
  Filled 2018-04-28 (×2): qty 2

## 2018-04-28 MED ORDER — PROMETHAZINE HCL 25 MG RE SUPP
25.0000 mg | Freq: Two times a day (BID) | RECTAL | Status: DC | PRN
  Filled 2018-04-28: qty 1

## 2018-04-28 MED ORDER — INSULIN ASPART 100 UNIT/ML ~~LOC~~ SOLN
0.0000 [IU] | Freq: Three times a day (TID) | SUBCUTANEOUS | Status: DC
  Administered 2018-04-28 – 2018-05-04 (×8): 1 [IU] via SUBCUTANEOUS

## 2018-04-28 MED ORDER — SODIUM CHLORIDE 0.9 % IV BOLUS
500.0000 mL | Freq: Once | INTRAVENOUS | Status: AC
  Administered 2018-04-28: 500 mL via INTRAVENOUS

## 2018-04-28 MED ORDER — PIPERACILLIN-TAZOBACTAM IN DEX 2-0.25 GM/50ML IV SOLN
2.2500 g | Freq: Three times a day (TID) | INTRAVENOUS | Status: DC
  Administered 2018-04-28 – 2018-04-29 (×4): 2.25 g via INTRAVENOUS
  Filled 2018-04-28 (×5): qty 50

## 2018-04-28 MED ORDER — POLYETHYLENE GLYCOL 3350 17 G PO PACK: 17.0000 g | PACK | Freq: Every day | ORAL | Status: DC | PRN

## 2018-04-28 MED ORDER — HEPARIN SODIUM (PORCINE) 5000 UNIT/ML IJ SOLN
5000.0000 [IU] | Freq: Three times a day (TID) | INTRAMUSCULAR | Status: DC
  Administered 2018-04-28 – 2018-05-08 (×31): 5000 [IU] via SUBCUTANEOUS
  Filled 2018-04-28 (×31): qty 1

## 2018-04-28 MED ORDER — FENTANYL 12 MCG/HR TD PT72
12.5000 ug | MEDICATED_PATCH | TRANSDERMAL | Status: DC
  Administered 2018-04-29 – 2018-05-08 (×4): 12.5 ug via TRANSDERMAL
  Filled 2018-04-28 (×4): qty 1

## 2018-04-28 MED ORDER — CEFAZOLIN SODIUM-DEXTROSE 1-4 GM/50ML-% IV SOLN: 1.0000 g | Freq: Three times a day (TID) | INTRAVENOUS | Status: DC

## 2018-04-28 NOTE — Progress Notes (Signed)
Patient transferred from ED to 5W30. Patient A&Ox4. Telemetry box [id number] applied and second verified. Skin assessment completed by 2 RNs. Chronic unstageable wound noted on sacrum and measured, see assessment. Patient has a small fentanyl patch on right upper shoulder from the nursing home that was placed on 6/11. Yoo,MD instructed this RN to leave the patch on the patient's shoulder for now and they would reassess the patient's need for the patch in the morning. Patient instructed how to use the callbell. Callbell within reach. Admission handbook given to patient. Will continue to monitor and treat per MD orders.

## 2018-04-28 NOTE — Progress Notes (Signed)
Bladder scan performed.  Zero urine noted

## 2018-04-28 NOTE — Progress Notes (Signed)
Initial Nutrition Assessment  DOCUMENTATION CODES:   Non-severe (moderate) malnutrition in context of chronic illness  INTERVENTION:  Monitor for diet advancement  NUTRITION DIAGNOSIS:   Moderate Malnutrition related to chronic illness as evidenced by moderate muscle depletion, moderate fat depletion.  GOAL:   Patient will meet greater than or equal to 90% of their needs  MONITOR:   Diet advancement, I & O's, Labs  REASON FOR ASSESSMENT:   Malnutrition Screening Tool    ASSESSMENT:   Patient is presenting with abdominal pain and vomiting. CT abdomen showing SBO, Colonic Diverticulosis, possible diverticulitis. PMH is significant for PAF not on anticoagulation, HTN, T2DM, sinus node dysfunction, CKD III, previous SBO, GERD, diastolic CHF with pulmonary hypertension  RD drawn to patient for positive MST. Spoke with patient at bedside. He was difficult to understand but this RD was able to comprehend some of the information he provided. Patient states he lost about 25 pounds over the past 3-4 months. Per chart his weight fluctuated from 175-182 up until March 2019, he now exhibits a 32 pound/17.6% severe weight loss over 3 months per chart. 25 pounds would still be a 14% severe weight loss. Appears to be accurate at this time.  It was difficult to understand what patient was saying about hx of PO intake. He does state he has been eating less but this RD was unable to understand exactly what and how much he had been eating.  481mL brown NGT output during visit.  Abdomen did not appear firm upon exam, still appears distended.  Monitor diet advancement vs need for nutrition support.  Labs reviewed:  Na 132, BUN/Cr 71/2.96  Medications reviewed and include:  Insulin NS at 120mL/hr  NUTRITION - FOCUSED PHYSICAL EXAM:    Most Recent Value  Orbital Region  Moderate depletion  Upper Arm Region  Severe depletion  Thoracic and Lumbar Region  Moderate depletion  Buccal  Region  Moderate depletion  Temple Region  Moderate depletion  Clavicle Bone Region  Moderate depletion  Clavicle and Acromion Bone Region  Moderate depletion  Scapular Bone Region  Moderate depletion  Dorsal Hand  Moderate depletion  Patellar Region  Moderate depletion  Anterior Thigh Region  Moderate depletion  Posterior Calf Region  Severe depletion       Diet Order:   Diet Order           Diet NPO time specified  Diet effective now          EDUCATION NEEDS:   Not appropriate for education at this time  Skin:  Skin Assessment: Skin Integrity Issues: Skin Integrity Issues:: Unstageable, Stage II Stage II: to buttocks Unstageable: to sacrum  Last BM:  PTA  Height:   Ht Readings from Last 1 Encounters:  04/27/18 5\' 6"  (1.676 m)    Weight:   Wt Readings from Last 1 Encounters:  04/27/18 149 lb 9.6 oz (67.9 kg)    Ideal Body Weight:  64.54 kg  BMI:  Body mass index is 24.15 kg/m.  Estimated Nutritional Needs:   Kcal:  1642 - 1895 calories (MSJ x1.3-1.5)  Protein:  95-108 grams (1.4-1.6g/kg)  Fluid:  >1.7L    Satira Anis. Leota Maka, MS, RD LDN Inpatient Clinical Dietitian Pager [phone]

## 2018-04-28 NOTE — Progress Notes (Signed)
Received report from Mario,RN in the ED. 

## 2018-04-28 NOTE — Consult Note (Signed)
[facility] Nurse wound consult note Reason for Consult:Stage 4 pressure injury, chronic, nonhealing Wound type:Pressure Pressure Injury POA: Yes Measurement: 9cm x 5cm x 2cm with 2.5 undermining from 6-7 o'clock Wound bed: red, moist with thin areas of necrotic slough evident and bony prominence Drainage (amount, consistency, odor) moderate amount of thin yellow exudate Periwound: intact with mild periwound erythema to 0.5cm Dressing procedure/placement/frequency: I have implemented a POC that includes a calcium alginate dressing for twice daily wound care for exudate management. He turns easily and completely from side to side so a mattress replacement is not indicated.  I suspect he spends a great deal of time OOB at the SNF, so will provide him with a pressure redistribution chair pad for OOB use.  Bilateral heel pressure redistribution heel boots are provided in this setting.  It is noted that his [facility] is elevated to 30 degrees for prevention of aspiration, this increases the risk pf delayed or nonhealing to his coccyx ulcer.   If a more aggressive POC is desired for this patient of advanced age with compromised nutritional status, suggest surgical consult.  [facility] nursing team will not follow, but will remain available to this patient, the nursing and medical teams.  Please re-consult if needed. Thanks, Maudie Flakes, MSN, RN, [place], Arther Abbott  Pager# [phone]

## 2018-04-28 NOTE — Progress Notes (Signed)
Rn notified by lab that Lactic Acid now 2.1 . Trending down from last result.

## 2018-04-28 NOTE — Progress Notes (Signed)
CRITICAL VALUE ALERT  Critical Value:  Lactic Acid 2.7  Date & Time Notied:  04/28/18 9810  Provider Notified: Yoo,MD notified  Orders Received/Actions taken: No new orders received. Will continue to monitor and treat per MD orders.

## 2018-04-28 NOTE — Progress Notes (Signed)
Pharmacy Antibiotic Note  [name] is a 84 y.o. male admitted on 04/27/2018 with Abdominal pain/UTI and ARF.  Pharmacy has been consulted for Zosyn dosing.  Plan: Zosyn 2.25 g IV q8h  Height: 5\' 6"  (167.6 cm) Weight: 149 lb 9.6 oz (67.9 kg) IBW/kg (Calculated) : 63.8  Temp (24hrs), Avg:97.6 F (36.4 C), Min:97.5 F (36.4 C), Max:97.7 F (36.5 C)  Recent Labs  Lab 04/27/18 2125 04/27/18 2209  WBC 23.9*  --   CREATININE 3.22*  --   LATICACIDVEN  --  5.08*    Estimated Creatinine Clearance: 12.4 mL/min (A) (by C-G formula based on SCr of 3.22 mg/dL (H)).    Allergies  Allergen Reactions  . Codeine Nausea Only   Caryl Pina 04/28/2018 1:48 AM

## 2018-04-28 NOTE — Clinical Social Work Note (Signed)
Clinical Social Work Assessment  Patient Details  Name: [name] MRN: [id number] Date of Birth: [birth year]  Date of referral:  04/28/18               Reason for consult:  Discharge Planning                Permission sought to share information with:  Facility Sport and exercise psychologist, Family Supports Permission granted to share information::  Yes, Verbal Permission Granted  Name::     [name]  Agency::  [facility]  Relationship::     Contact Information:  [phone]  Housing/Transportation Living arrangements for the past 2 months:  [place] of Information:  Other (Comment Required)([name]) Patient Interpreter Needed:  None Criminal Activity/Legal Involvement Pertinent to Current Situation/Hospitalization:  No - Comment as needed Significant Relationships:  Adult Children Lives with:  Facility Resident Do you feel safe going back to the place where you live?  No Need for family participation in patient care:  Yes (Comment)  Care giving concerns:  CSW received consult for return to SNF placement at time of discharge. CSW spoke with patient (hard of hearing) and [name]. Patient had been discharged to [facility]osp[facility] a few months ago and then transitioned to [facility] term [facility]. He will return there at discharge. CSW to continue to follow and assist with discharge planning needs.   Social Worker assessment / plan:  CSW spoke with patient and [name] concerning return to [facility].   Employment status:  Retired Forensic scientist:  Medicare PT Recommendations:  Not assessed at this time [place] / Referral to community resources:  [place]  Patient/Family's Response to care:  [name] reports agreement with discharge plan.   Patient/Family's Understanding of and Emotional Response to Diagnosis, Current Treatment, and Prognosis:  Patient/family is realistic regarding therapy needs and expressed being hopeful  for return to SNF placement. Patient and [name] expressed understanding of CSW role and discharge process as well as medical condition. No questions/concerns about plan or treatment.    Emotional Assessment Appearance:  Appears stated age Attitude/Demeanor/Rapport:  (Appropriate but hard of hearing) Affect (typically observed):  Accepting, Appropriate Orientation:  Oriented to Self, Oriented to Place, Oriented to  Time, Oriented to Situation Alcohol / Substance use:  Not Applicable Psych involvement (Current and /or in the community):  No (Comment)  Discharge Needs  Concerns to be addressed:  Care Coordination Readmission within the last 30 days:  No Current discharge risk:  None Barriers to Discharge:  Continued Medical Work up   [employer], [place] 04/28/2018, 11:39 AM

## 2018-04-29 ENCOUNTER — Inpatient Hospital Stay (HOSPITAL_COMMUNITY): Payer: Medicare Other

## 2018-04-29 LAB — CBC
HCT: 26.4 % — ABNORMAL LOW (ref 39.0–52.0)
Hemoglobin: 8.4 g/dL — ABNORMAL LOW (ref 13.0–17.0)
MCH: 27.7 pg (ref 26.0–34.0)
MCHC: 31.8 g/dL (ref 30.0–36.0)
MCV: 87.1 fL (ref 78.0–100.0)
PLATELETS: 354 10*3/uL (ref 150–400)
RBC: 3.03 MIL/uL — ABNORMAL LOW (ref 4.22–5.81)
RDW: 17.9 % — AB (ref 11.5–15.5)
WBC: 14.9 10*3/uL — ABNORMAL HIGH (ref 4.0–10.5)

## 2018-04-29 LAB — BASIC METABOLIC PANEL
Anion gap: 14 (ref 5–15)
BUN: 61 mg/dL — AB (ref 6–20)
CO2: 27 mmol/L (ref 22–32)
CREATININE: 2.23 mg/dL — AB (ref 0.61–1.24)
Calcium: 7.8 mg/dL — ABNORMAL LOW (ref 8.9–10.3)
Chloride: 98 mmol/L — ABNORMAL LOW (ref 101–111)
GFR calc Af Amer: 27 mL/min — ABNORMAL LOW (ref >60)
GFR, EST NON AFRICAN AMERICAN: 23 mL/min — AB (ref >60)
GLUCOSE: 96 mg/dL (ref 65–99)
Potassium: 3.2 mmol/L — ABNORMAL LOW (ref 3.5–5.1)
Sodium: 139 mmol/L (ref 135–145)

## 2018-04-29 LAB — GLUCOSE, CAPILLARY
GLUCOSE-CAPILLARY: 89 mg/dL (ref 65–99)
GLUCOSE-CAPILLARY: 81 mg/dL (ref 65–99)
Glucose-Capillary: 79 mg/dL (ref 65–99)
Glucose-Capillary: 73 mg/dL (ref 65–99)

## 2018-04-29 MED ORDER — DEXTROSE IN LACTATED RINGERS 5 % IV SOLN
INTRAVENOUS | Status: DC
  Administered 2018-04-30 (×3): via INTRAVENOUS

## 2018-04-29 MED ORDER — LACTATED RINGERS IV SOLN
INTRAVENOUS | Status: DC
  Administered 2018-04-29: 19:00:00 via INTRAVENOUS

## 2018-04-29 MED ORDER — SODIUM CHLORIDE 0.9 % IV SOLN
3.0000 g | Freq: Two times a day (BID) | INTRAVENOUS | Status: DC
  Administered 2018-04-29 – 2018-05-01 (×4): 3 g via INTRAVENOUS
  Filled 2018-04-29 (×5): qty 3

## 2018-04-29 MED ORDER — POTASSIUM CHLORIDE 10 MEQ/100ML IV SOLN
10.0000 meq | INTRAVENOUS | Status: AC
  Administered 2018-04-29 (×4): 10 meq via INTRAVENOUS
  Filled 2018-04-29 (×4): qty 100

## 2018-04-29 MED ORDER — FAMOTIDINE IN NACL 20-0.9 MG/50ML-% IV SOLN
20.0000 mg | Freq: Two times a day (BID) | INTRAVENOUS | Status: DC
  Administered 2018-04-30: 20 mg via INTRAVENOUS
  Filled 2018-04-29: qty 50

## 2018-04-29 NOTE — Progress Notes (Signed)
Family Medicine Teaching Service Daily Progress Note Intern Pager: [phone]  Patient name: [name] Medical record number: [id number] Date of birth: 1920-05-24 Age: 84 y.o. Gender: male  Primary Care Provider: Dickie La, MD Consultants: None Code Status: DNR  Pt Overview and Major Events to Date:  [name] is a 84 y.o. male presenting with abdominal pain. PMH is significant for PAF not on anticoagulation, HTN, T2DM, sinus node dysfunction, CKD III, previous SBO, GERD, diastolic CHF with pulmonary hypertension.  Assessment and Plan:   SBO. Due to SBO with possible diverticulitis. Patient has history of previous SBO requiring NG tube, is presenting with abdominal pain and emesis.  CT abdomen showing small bowel obstruction and colonic diverticulosis, possible diverculitis.  -Cont NPO with NG tube for decompression -Discont IV zosyn started in ED (6/11-12), start Unasyn (6/13-) today -Urine culture grew [name] -maintenance IVFs -PRN Phenergan  AKI on CKD. Cr 3.22 (BL 1.3).  Likely in the setting of UTI. -Monitor creatinine -Avoid nephrotoxic agents  Diabetes.  Last A1c 5.8 on 08/10/2017. -Monitor CBGs -sSSI  HFpEF with pulm HTN. Stable  BP on admit 111/63 -Holding home spironolactone and lasix for AKI -Monitor blood pressure  FEN/GI: NPO PPx: Heparin  Disposition: inpatient  Subjective:  Patient states he feels somewhat better. He removed his NG tube last night and it had to be reinserted this am. It is in place and draining this morning when I was at bedside. He is still complaining of abdominal discomfort but he says overall he feels better.  Objective: Temp:  [97.7 F (36.5 C)-97.9 F (36.6 C)] 97.8 F (36.6 C) (06/13 0532) Pulse Rate:  [68-100] 68 (06/13 0532) Resp:  [14-18] 18 (06/13 0532) BP: (93-115)/(48-51) 115/50 (06/13 0532) SpO2:  [95 %-100 %] 95 % (06/13 0532) Physical Exam:  Gen: Alert and Oriented x 3, NAD HEENT:  Normocephalic, atraumatic, PERRLA, EOMI CV: RRR, no murmurs, normal S1, S2 split, +2 pulses dorsalis pedis bilaterally Resp: CTAB, no wheezing, rales, or rhonchi, comfortable work of breathing Abd: non-distended, tender to palpation in all quadrants, soft, +bs in all four quadrants MSK: Moves all four extremities Ext: no clubbing, cyanosis, or edema Skin: warm, dry, intact, no rashes  Laboratory: Recent Labs  Lab 04/27/18 2125 04/28/18 0551  WBC 23.9* 19.3*  HGB 10.4* 8.6*  HCT 32.1* 26.6*  PLT 397 344   Recent Labs  Lab 04/27/18 2125 04/28/18 0551  NA 131* 132*  K 4.2 4.0  CL 83* 92*  CO2 28 27  BUN 69* 71*  CREATININE 3.22* 2.96*  CALCIUM 8.7* 7.9*  PROT 6.6  --   BILITOT 0.6  --   ALKPHOS 174*  --   ALT 62  --   AST 34  --   GLUCOSE 201* 147*   Lacitc Acid: 2.1  Imaging/Diagnostic Tests:  CT Abd IMPRESSION: 1. Small-bowel obstruction with transition point in the left abdomen, likely due to adhesions. 2. Colonic diverticulosis. Minimal pericolonic fat stranding and trace fluid in the left pericolic gutter may reflect mild diverticular inflammation/diverticulitis. No perforation or abscess. 3. Mild right hydronephrosis and proximal ureter without cause obstruction. Findings may be seen with urinary tract infection. Bladder decompressed by Foley catheter with diffuse bladder wall thickening and perivesicular edema. Recommend correlation with Urinalysis.  KUB IMPRESSION: Tip of the enteric tube below the diaphragm in the stomach, side-port likely just beyond the gastroesophageal junction.  [name], DO 04/29/2018, 7:28 AM PGY-1,  [place] Intern  pager: [phone], text pages welcome

## 2018-04-29 NOTE — Progress Notes (Signed)
Pt's NG tube was displaced by about 2 inches. Abdominal sounds were heard during suctioning per assessment. MD Resident on call was notified. Xray was ordered to verify placement. Will continue to monitor.

## 2018-04-30 LAB — CBC
HEMATOCRIT: 26.5 % — AB (ref 39.0–52.0)
Hemoglobin: 8.4 g/dL — ABNORMAL LOW (ref 13.0–17.0)
MCH: 28.1 pg (ref 26.0–34.0)
MCHC: 31.7 g/dL (ref 30.0–36.0)
MCV: 88.6 fL (ref 78.0–100.0)
Platelets: 385 10*3/uL (ref 150–400)
RBC: 2.99 MIL/uL — ABNORMAL LOW (ref 4.22–5.81)
RDW: 18.2 % — AB (ref 11.5–15.5)
WBC: 13.1 10*3/uL — ABNORMAL HIGH (ref 4.0–10.5)

## 2018-04-30 LAB — GLUCOSE, CAPILLARY
GLUCOSE-CAPILLARY: 150 mg/dL — AB (ref 65–99)
GLUCOSE-CAPILLARY: 121 mg/dL — AB (ref 65–99)
Glucose-Capillary: 128 mg/dL — ABNORMAL HIGH (ref 65–99)
Glucose-Capillary: 112 mg/dL — ABNORMAL HIGH (ref 65–99)

## 2018-04-30 LAB — BASIC METABOLIC PANEL
Anion gap: 13 (ref 5–15)
BUN: 53 mg/dL — ABNORMAL HIGH (ref 6–20)
CALCIUM: 8 mg/dL — AB (ref 8.9–10.3)
CO2: 21 mmol/L — AB (ref 22–32)
CREATININE: 2.01 mg/dL — AB (ref 0.61–1.24)
Chloride: 105 mmol/L (ref 101–111)
GFR calc Af Amer: 31 mL/min — ABNORMAL LOW (ref >60)
GFR calc non Af Amer: 27 mL/min — ABNORMAL LOW (ref >60)
Glucose, Bld: 130 mg/dL — ABNORMAL HIGH (ref 65–99)
Potassium: 3.5 mmol/L (ref 3.5–5.1)
Sodium: 139 mmol/L (ref 135–145)

## 2018-04-30 LAB — URINE CULTURE

## 2018-04-30 MED ORDER — FAMOTIDINE IN NACL 20-0.9 MG/50ML-% IV SOLN
20.0000 mg | INTRAVENOUS | Status: DC
  Administered 2018-05-01: 20 mg via INTRAVENOUS
  Filled 2018-04-30: qty 50

## 2018-04-30 NOTE — Progress Notes (Signed)
Pt's NG tube output very minimal, not suctioning into the canister and very bloody. MD on call was notified. She came to the bedside to assess the patient and the NG tube. She advised to have the NG tube connected to just gravity. Order completed. Will continue to monitor.

## 2018-04-30 NOTE — Progress Notes (Signed)
CALL PAGER [phone] for any questions or notifications regarding this patient   FMTS Attending Daily Note: [name] MD Attending pager: [phone]  office [phone] Additionally patient has a sacral pressure ulcer. It is not giving him pain. Ulcer description:Stage 4 pressure injury, chronic, nonhealing  Wound type:Pressure  Pressure Injury POA: Yes  Measurement: 9cm x 5cm x 2cm with 2.5 undermining from 6-7 o'clock  Wound bed: red, moist with thin areas of necrotic slough evident and bony prominence  Drainage (amount, consistency, odor) moderate amount of thin yellow exudate  Periwound: intact with mild periwound erythema to 0.5cm   He was somewhat confused when I saw him an hour ago. He was quite happy, telling me about a trip he took with [place] to the [place]. He thinks he is back at [facility]. He is hungry. Belly is soft. NGT to be pulled and clear liquids started. I left mssg on [name]'s voice mail up[dating her.

## 2018-04-30 NOTE — Progress Notes (Signed)
Family Medicine Teaching Service Daily Progress Note Intern Pager: [phone]  Patient name: [name] Medical record number: [id number] Date of birth: 03-Jan-1920 Age: 84 y.o. Gender: male  Primary Care Provider: Dickie La, MD Consultants: None Code Status: DNR  Pt Overview and Major Events to Date:  [name] is a 84 y.o. male presenting with abdominal pain. PMH is significant for PAF not on anticoagulation, HTN, T2DM, sinus node dysfunction, CKD III, previous SBO, GERD, diastolic CHF with pulmonary hypertension.  Assessment and Plan:  SBO. Improved. Due to SBO with possible diverticulitis. Less likely diverticulitis as patient has improved greatly although he has been on appropriate antibiotics. I suspect however that is resolution of pain is more attributable to his resolution of SBO via NG tube. Patient has history of previous SBO requiring NG tube, is presenting with abdominal pain and emesis.  CT abdomen showing small bowel obstruction and colonic diverticulosis, possible diverculitis.  -Discont NG tube for decompression as output as dropped to <100cc -Thin liquid diet -Discont IV zosyn started in ED (6/11-12), cont Unasyn (6/13-) today for total of day 4 of 7 of antibiotics -Urine culture grew Proteus Mirabalus -maintenance IVFs D5LR -PRN Phenergan  AKI on CKD. Improving. Cr 2.01 (BL 1.3).  Likely in the setting of UTI. -Monitor creatinine -Avoid nephrotoxic agents  Diabetes.  Last A1c 5.8 on 08/10/2017. -Monitor CBGs -sSSI  HFpEF with pulm HTN. Stable  BP on admit 111/63 -Holding home spironolactone and lasix for AKI -Monitor blood pressure  FEN/GI: NPO PPx: Heparin  Disposition: inpatient  Subjective:  Patient states he feels much better. He wants something to eat and has no more abdominal pain. He states he feels much better and appears more alert and oriented.   Objective: Temp:  [97.4 F (36.3 C)-97.7 F (36.5 C)] 97.6 F (36.4 C) (06/14  0447) Pulse Rate:  [50-82] 82 (06/14 0447) Resp:  [18] 18 (06/14 0447) BP: (107-146)/(44-99) 146/57 (06/14 0447) SpO2:  [85 %-100 %] 100 % (06/14 0447) Physical Exam:  Gen: Alert and Oriented x 3, NAD HEENT: Normocephalic, atraumatic, PERRLA, EOMI CV: Regular rate, irregularly irregular rhythm, 2/6 systolic murmur, normal S1, S2 split, +2 pulses dorsalis pedis bilaterally Resp: CTAB, no wheezing, rales, or rhonchi, comfortable work of breathing Abd: non-distended, non-tender, soft, +bs in all four quadrants MSK: Moves all four extremities Ext: no clubbing, cyanosis, or edema Skin: warm, dry, intact, no rashes  Laboratory: Recent Labs  Lab 04/28/18 0551 04/29/18 0823 04/30/18 0723  WBC 19.3* 14.9* 13.1*  HGB 8.6* 8.4* 8.4*  HCT 26.6* 26.4* 26.5*  PLT 344 354 385   Recent Labs  Lab 04/27/18 2125 04/28/18 0551 04/29/18 0823 04/30/18 0723  NA 131* 132* 139 139  K 4.2 4.0 3.2* 3.5  CL 83* 92* 98* 105  CO2 28 27 27  21*  BUN 69* 71* 61* 53*  CREATININE 3.22* 2.96* 2.23* 2.01*  CALCIUM 8.7* 7.9* 7.8* 8.0*  PROT 6.6  --   --   --   BILITOT 0.6  --   --   --   ALKPHOS 174*  --   --   --   ALT 62  --   --   --   AST 34  --   --   --   GLUCOSE 201* 147* 96 130*   Lacitc Acid: 2.1  Imaging/Diagnostic Tests:  CT Abd IMPRESSION: 1. Small-bowel obstruction with transition point in the left abdomen, likely due to adhesions. 2. Colonic diverticulosis. Minimal pericolonic  fat stranding and trace fluid in the left pericolic gutter may reflect mild diverticular inflammation/diverticulitis. No perforation or abscess. 3. Mild right hydronephrosis and proximal ureter without cause obstruction. Findings may be seen with urinary tract infection. Bladder decompressed by Foley catheter with diffuse bladder wall thickening and perivesicular edema. Recommend correlation with Urinalysis.  KUB IMPRESSION: Tip of the enteric tube below the diaphragm in the stomach, side-port likely  just beyond the gastroesophageal junction.  [name], DO 04/30/2018, 7:57 AM PGY-1,  [facility] Intern pager: [phone], text pages welcome

## 2018-05-01 LAB — BASIC METABOLIC PANEL
ANION GAP: 13 (ref 5–15)
BUN: 37 mg/dL — ABNORMAL HIGH (ref 6–20)
CALCIUM: 8.3 mg/dL — AB (ref 8.9–10.3)
CO2: 24 mmol/L (ref 22–32)
Chloride: 107 mmol/L (ref 101–111)
Creatinine, Ser: 1.6 mg/dL — ABNORMAL HIGH (ref 0.61–1.24)
GFR calc Af Amer: 41 mL/min — ABNORMAL LOW (ref >60)
GFR calc non Af Amer: 35 mL/min — ABNORMAL LOW (ref >60)
GLUCOSE: 130 mg/dL — AB (ref 65–99)
Potassium: 3 mmol/L — ABNORMAL LOW (ref 3.5–5.1)
Sodium: 144 mmol/L (ref 135–145)

## 2018-05-01 LAB — CBC
HEMATOCRIT: 26.5 % — AB (ref 39.0–52.0)
Hemoglobin: 8.2 g/dL — ABNORMAL LOW (ref 13.0–17.0)
MCH: 27.9 pg (ref 26.0–34.0)
MCHC: 30.9 g/dL (ref 30.0–36.0)
MCV: 90.1 fL (ref 78.0–100.0)
PLATELETS: 356 10*3/uL (ref 150–400)
RBC: 2.94 MIL/uL — ABNORMAL LOW (ref 4.22–5.81)
RDW: 18.4 % — AB (ref 11.5–15.5)
WBC: 11.6 10*3/uL — AB (ref 4.0–10.5)

## 2018-05-01 LAB — GLUCOSE, CAPILLARY
GLUCOSE-CAPILLARY: 99 mg/dL (ref 65–99)
Glucose-Capillary: 137 mg/dL — ABNORMAL HIGH (ref 65–99)
Glucose-Capillary: 96 mg/dL (ref 65–99)
Glucose-Capillary: 101 mg/dL — ABNORMAL HIGH (ref 65–99)

## 2018-05-01 MED ORDER — AMOXICILLIN-POT CLAVULANATE 875-125 MG PO TABS: 1.0000 | ORAL_TABLET | Freq: Two times a day (BID) | ORAL | Status: DC

## 2018-05-01 MED ORDER — AMOXICILLIN-POT CLAVULANATE 500-125 MG PO TABS
1.0000 | ORAL_TABLET | Freq: Two times a day (BID) | ORAL | Status: DC
  Administered 2018-05-01: 500 mg via ORAL
  Filled 2018-05-01: qty 1

## 2018-05-01 MED ORDER — POTASSIUM CHLORIDE CRYS ER 20 MEQ PO TBCR
40.0000 meq | EXTENDED_RELEASE_TABLET | Freq: Two times a day (BID) | ORAL | Status: AC
  Administered 2018-05-01 – 2018-05-07 (×14): 40 meq via ORAL
  Filled 2018-05-01 (×15): qty 2

## 2018-05-01 NOTE — Progress Notes (Signed)
Family Medicine Teaching Service Daily Progress Note Ford Pager: [phone]  Patient name: [name] Medical record number: [id number] Date of birth: 02-Mar-1920 Age: 84 y.o. Gender: male  Primary Care Provider: Dickie La, MD Consultants: None Code Status: DNR  Pt Overview and Major Events to Date:  [name] is a 84 y.o. male presenting with abdominal pain. PMH is significant for PAF not on anticoagulation, HTN, T2DM, sinus node dysfunction, CKD III, previous SBO, GERD, diastolic CHF with pulmonary hypertension.  Assessment and Plan:  SBO. Improved. Due to SBO with possible diverticulitis. Less likely diverticulitis as patient has improved greatly although he has been on appropriate antibiotics. I suspect however that is resolution of pain is more attributable to his resolution of SBO via NG tube. Patient has history of previous SBO requiring NG tube, is presenting with abdominal pain and emesis.  CT abdomen showing small bowel obstruction and colonic diverticulosis, possible diverculitis.  - Advance diet - stop maintenance IVFs D5LR - PRN Phenergan  UTI. Urine culture grew [name]. Patient does have indwelling foley catheter. U/A did have some leuks and bacteria - Discontinued IV zosyn started in ED (6/11-12) and discont Unasyn (6/13-15) - Started Augmentin today (6/15) but stopping and will observe. Total of day 5 of antibiotics  AKI on CKD. Improving. Cr 2.01>1.60 (BL 1.3).  Likely in the setting of UTI. -Monitor creatinine -Avoid nephrotoxic agents  Diabetes.  Last A1c 5.8 on 08/10/2017. -Monitor CBGs -sSSI  HFpEF with pulm HTN. Stable  BP on admit 111/63 -Holding home spironolactone and lasix for AKI -Monitor blood pressure  FEN/GI: NPO PPx: Heparin  Disposition: inpatient  Subjective:  Patient states he feels well. He denies any more abdominal pain and states he has had something to drink and he ate last night without any nausea of vomiting. He  states he did not have a bowel movement this morning but one is recorded in his chart. He knows he is at [facility] this morning and that the president is [name].   Objective: Temp:  [97.5 F (36.4 C)-97.9 F (36.6 C)] 97.9 F (36.6 C) (06/15 0500) Pulse Rate:  [70-72] 72 (06/15 0500) Resp:  [17-18] 17 (06/15 0500) BP: (153-159)/(64-69) 153/69 (06/15 0500) SpO2:  [95 %-98 %] 98 % (06/15 0500) Physical Exam:  Gen: Alert and Oriented x 3, NAD HEENT: Normocephalic, atraumatic, PERRLA, EOMI CV: RRR, no murmurs, normal S1, S2 split, +1 pulses dorsalis pedis bilaterally Resp: CTAB, no wheezing, rales, or rhonchi, comfortable work of breathing Abd: non-distended, non-tender, soft, +bs in all four quadrants MSK: Moves all four extremities Ext: no clubbing, cyanosis, or edema Skin: warm, dry, intact, no rashes  Laboratory: Recent Labs  Lab 04/29/18 0823 04/30/18 0723 05/01/18 0521  WBC 14.9* 13.1* 11.6*  HGB 8.4* 8.4* 8.2*  HCT 26.4* 26.5* 26.5*  PLT 354 385 356   Recent Labs  Lab 04/27/18 2125  04/29/18 0823 04/30/18 0723 05/01/18 0521  NA 131*   < > 139 139 144  K 4.2   < > 3.2* 3.5 3.0*  CL 83*   < > 98* 105 107  CO2 28   < > 27 21* 24  BUN 69*   < > 61* 53* 37*  CREATININE 3.22*   < > 2.23* 2.01* 1.60*  CALCIUM 8.7*   < > 7.8* 8.0* 8.3*  PROT 6.6  --   --   --   --   BILITOT 0.6  --   --   --   --  ALKPHOS 174*  --   --   --   --   ALT 62  --   --   --   --   AST 34  --   --   --   --   GLUCOSE 201*   < > 96 130* 130*   < > = values in this interval not displayed.   Lacitc Acid: 2.1  Imaging/Diagnostic Tests:  CT Abd IMPRESSION: 1. Small-bowel obstruction with transition point in the left abdomen, likely due to adhesions. 2. Colonic diverticulosis. Minimal pericolonic fat stranding and trace fluid in the left pericolic gutter may reflect mild diverticular inflammation/diverticulitis. No perforation or abscess. 3. Mild right hydronephrosis and  proximal ureter without cause obstruction. Findings may be seen with urinary tract infection. Bladder decompressed by Foley catheter with diffuse bladder wall thickening and perivesicular edema. Recommend correlation with Urinalysis.  KUB IMPRESSION: Tip of the enteric tube below the diaphragm in the stomach, side-port likely just beyond the gastroesophageal junction.  Samuel Alpha, DO 05/01/2018, 8:15 AM PGY-1,  [name] pager: [phone], text pages welcome

## 2018-05-01 NOTE — Progress Notes (Signed)
PHARMACY NOTE:  ANTIMICROBIAL RENAL DOSAGE ADJUSTMENT  Current antimicrobial regimen includes a mismatch between antimicrobial dosage and estimated renal function.  As per policy approved by the Pharmacy & Therapeutics and Medical Executive Committees, the antimicrobial dosage will be adjusted accordingly.  Current antimicrobial dosage:  augmentin 875 mg bid  Indication: diverticulitis  Renal Function:  Estimated Creatinine Clearance: 24.9 mL/min (A) (by C-G formula based on SCr of 1.6 mg/dL (H)). []      On intermittent HD, scheduled: []      On CRRT    Antimicrobial dosage has been changed to:  augmentin 500 mg bid  Additional comments:   Thank you for allowing pharmacy to be a part of this patient's care.  Lynelle Doctor, [facility] 05/01/2018 11:10 AM

## 2018-05-01 NOTE — Discharge Summary (Signed)
[facility] Discharge Summary  Patient name: [name] Medical record number: [id number] Date of birth: 30-Jun-1920 Age: 83 y.o. Gender: male Date of Admission: 04/27/2018  Date of Discharge: 05/01/2018 Admitting Physician: Zenia Resides, MD  Primary Care Provider: Dickie La, MD Consultants: None  Indication for Hospitalization:  Small Bowel Obstruction  Discharge Diagnoses/Problem List:  SBO, resolved Aspiration PNA, resolve but with continued chronic aspiration UTI, resolved AKI (resolved) on CKD Anemia Diabetes HFpEF with pulm HTN  Disposition: [facility]  Discharge Condition: medically stable, improved  Discharge Exam:  Gen: Pleasant elderly male sitting up in bed comfortably, in NAD HEENT: Normocephalic, atraumatic, hearing aids in place CV: RRR, no murmurs, normal S1, S2 split Resp: Scattered crackles, normal effort on 3L via [place]. No wheezes or rhonchi. Abd: soft, nontender, nondistended, + bowel sounds MSK: moving all limbs equally Ext: no clubbing, cyanosis, or edema; padding on heels bilaterally Skin: warm, dry, intact, no rashes   Brief Hospital Course:  Samuel [name] is a 84y/o male who presented from [facility] with abdominal pain, nausea, and vomiting. In the ED he had a CT scan that showed a small bowel obstruction and possible diverticulitis. Given that he had an elevated WBC, fever, and possible diverticulitis he was started on IV zosyn, an NG tube was started and he was made NPO. It was deemed unlikely that he had diverticulitis so he was deescalated to IV unasyn. He also had a U/A that was significant for moderate leukocytes, WBC clumps, and Hyaline casts. A urine culture was performed and it grew >100k Proteus Mirabilis. His Blood Cultures were negative for growth at 3 days and his IV antibiotics were stopped once his NG tube was pulled. He was started on Augmentin due to his urine culture sensitivities panel and  completed a 5 day course (6/15-20). His abdominal pain improved and he tolerated oral intake and had a bowel movement before discharge.  While he was in the hospital [name] developed an cough that was concerning for aspiration PNA. We already had him on antibiotic coverage for diverticulosis and a UTI and it was decided to continue Augmentin for 5 days, it was not continued due to his continued aspiration risk after a discussion with family.  Issues for Follow Up:  1. Please ensure he is taking in adequate meals and fluids and is having bowel movements. 2. He can restart his spirolactone and lasix at home. 3. He was given Potassium daily while inpatient but ensure that he is no longer taking potassium pills.  Significant Procedures: none  Significant Labs and Imaging:  Recent Labs  Lab 05/06/18 0409 05/07/18 0455 05/08/18 0321  WBC 15.8* 14.0* 11.6*  HGB 8.2* 8.1* 7.6*  HCT 27.4* 27.0* 25.5*  PLT 239 264 262   Recent Labs  Lab 05/03/18 0708 05/04/18 0724 05/06/18 0409 05/07/18 0455 05/08/18 0321  NA 148* 149* 148* 143 143  K 4.6 4.1 4.3 4.8 4.8  CL 115* 116* 117* 113* 111  CO2 24 24 23 23 26   GLUCOSE 132* 166* 103* 84 81  BUN 34* 30* 26* 21* 20  CREATININE 1.38* 1.27* 1.02 0.91 0.95  CALCIUM 7.8* 8.0* 7.7* 7.5* 7.7*   Lactic Acid: 2.1 UCx: >100k Proteus Mirabilis; sensitive to Augmentin  CXR 05/04/18 IMPRESSION: 1. Cardiac pacer with lead tips over the right atrium right ventricle. Stable cardiomegaly. 2. Patchy bilateral pulmonary infiltrates/edema. Small bilateral pleural effusions. Similar findings noted on prior exam.  CT Abd  04/27/18 IMPRESSION: 1. Small-bowel obstruction with transition point in the left abdomen, likely due to adhesions. 2. Colonic diverticulosis. Minimal pericolonic fat stranding and trace fluid in the left pericolic gutter may reflect mild diverticular inflammation/diverticulitis. No perforation or abscess. 3. Mild right hydronephrosis  and proximal ureter without cause obstruction. Findings may be seen with urinary tract infection. Bladder decompressed by Foley catheter with diffuse bladder wall thickening and perivesicular edema. Recommend correlation with Urinalysis.   Results/Tests Pending at Time of Discharge: None  Discharge Medications:  Allergies as of 05/08/2018      Reactions   Codeine Nausea Only      Medication List    STOP taking these medications   acetaminophen 325 MG tablet Commonly known as:  TYLENOL   acetaminophen 650 MG suppository Commonly known as:  TYLENOL   HYDROmorphone 2 MG tablet Commonly known as:  DILAUDID   morphine 2 MG/ML injection   nebivolol 5 MG tablet Commonly known as:  BYSTOLIC   silodosin 4 MG Caps capsule Commonly known as:  RAPAFLO     TAKE these medications   bisacodyl 10 MG suppository Commonly known as:  DULCOLAX Place 1 suppository (10 mg total) rectally 2 (two) times daily. What changed:    when to take this  reasons to take this   docusate sodium 100 MG capsule Commonly known as:  COLACE Take 100 mg by mouth daily as needed for mild constipation.   feeding supplement (PRO-STAT SUGAR FREE 64) Liqd Take 30 mLs by mouth daily.   fentaNYL 12 MCG/HR Commonly known as:  DURAGESIC - dosed mcg/hr Place 12.5 mcg onto the skin every 3 (three) days.   furosemide 40 MG tablet Commonly known as:  LASIX Take 40 mg by mouth 2 (two) times daily.   magnesium hydroxide 400 MG/5ML suspension Commonly known as:  MILK OF MAGNESIA Take 30 mLs by mouth once.   multivitamin with minerals Tabs tablet Take 1 tablet by mouth daily.   pantoprazole 40 MG tablet Commonly known as:  PROTONIX Take 1 tablet (40 mg total) by mouth daily before breakfast.   polyethylene glycol packet Commonly known as:  MIRALAX / GLYCOLAX Take 17 g by mouth daily.   promethazine 25 MG suppository Commonly known as:  PHENERGAN Place 25 mg rectally every 12 (twelve) hours as  needed for nausea or vomiting.   senna-docusate 8.6-50 MG tablet Commonly known as:  Senokot-S Take 1 tablet by mouth 2 (two) times daily. What changed:    how much to take  when to take this   spironolactone 25 MG tablet Commonly known as:  ALDACTONE Take 25 mg by mouth daily.       Discharge Instructions: Please refer to Patient Instructions section of EMR for full details.  Patient was counseled important signs and symptoms that should prompt return to medical care, changes in medications, dietary instructions, activity restrictions, and follow up appointments.   Follow-Up Appointments:  Dr. Nori Riis will arrange for follow up with patient Contact information for after-discharge care    Destination    [facility] .   Service:  Skilled Nursing Contact information: [address] [place], [place], DO 05/08/2018, 12:08 PM PGY-2, [place]

## 2018-05-01 NOTE — Progress Notes (Deleted)
PHARMACY NOTE:  ANTIMICROBIAL RENAL DOSAGE ADJUSTMENT  Current antimicrobial regimen includes a mismatch between antimicrobial dosage and estimated renal function.  As per policy approved by the Pharmacy & Therapeutics and Medical Executive Committees, the antimicrobial dosage will be adjusted accordingly.  Current antimicrobial dosage:  augmentin 875 mg bid  Indication: diverticulitis  Renal Function:  Estimated Creatinine Clearance: 24.9 mL/min (A) (by C-G formula based on SCr of 1.6 mg/dL (H)). []      On intermittent HD, scheduled: []      On CRRT    Antimicrobial dosage has been changed to:  augmentin 500 mg bid  Additional comments:   Thank you for allowing pharmacy to be a part of this patient's care.  Lynelle Doctor, [facility] 05/01/2018 11:08 AM

## 2018-05-01 NOTE — Discharge Instructions (Signed)
You were hospitalized for a small bowel obstruction with possible diverticulitis and a UTI. You improved with nasogastric tube suction and antibiotics. Please be sure to finish taking the full course of your antibiotics. Please return to the hospital if you have fever, nausea, and abdominal pain.

## 2018-05-02 ENCOUNTER — Inpatient Hospital Stay (HOSPITAL_COMMUNITY): Payer: Medicare Other

## 2018-05-02 LAB — CULTURE, BLOOD (ROUTINE X 2)
Culture: NO GROWTH
Culture: NO GROWTH
SPECIAL REQUESTS: ADEQUATE
SPECIAL REQUESTS: ADEQUATE

## 2018-05-02 LAB — CBC WITH DIFFERENTIAL/PLATELET
ABS IMMATURE GRANULOCYTES: 0.5 10*3/uL — AB (ref 0.0–0.1)
Basophils Absolute: 0.1 10*3/uL (ref 0.0–0.1)
Basophils Relative: 0 %
EOS ABS: 0.1 10*3/uL (ref 0.0–0.7)
Eosinophils Relative: 0 %
HCT: 32.5 % — ABNORMAL LOW (ref 39.0–52.0)
Hemoglobin: 9.7 g/dL — ABNORMAL LOW (ref 13.0–17.0)
IMMATURE GRANULOCYTES: 3 %
LYMPHS ABS: 1.1 10*3/uL (ref 0.7–4.0)
Lymphocytes Relative: 6 %
MCH: 28 pg (ref 26.0–34.0)
MCHC: 29.8 g/dL — ABNORMAL LOW (ref 30.0–36.0)
MCV: 93.9 fL (ref 78.0–100.0)
MONOS PCT: 5 %
Monocytes Absolute: 0.8 10*3/uL (ref 0.1–1.0)
NEUTROS PCT: 86 %
Neutro Abs: 15.2 10*3/uL — ABNORMAL HIGH (ref 1.7–7.7)
Platelets: 364 10*3/uL (ref 150–400)
RBC: 3.46 MIL/uL — ABNORMAL LOW (ref 4.22–5.81)
RDW: 18.6 % — AB (ref 11.5–15.5)
WBC: 17.8 10*3/uL — ABNORMAL HIGH (ref 4.0–10.5)

## 2018-05-02 LAB — BASIC METABOLIC PANEL
ANION GAP: 12 (ref 5–15)
BUN: 31 mg/dL — ABNORMAL HIGH (ref 6–20)
CHLORIDE: 111 mmol/L (ref 101–111)
CO2: 26 mmol/L (ref 22–32)
Calcium: 8.8 mg/dL — ABNORMAL LOW (ref 8.9–10.3)
Creatinine, Ser: 1.38 mg/dL — ABNORMAL HIGH (ref 0.61–1.24)
GFR calc Af Amer: 48 mL/min — ABNORMAL LOW (ref >60)
GFR calc non Af Amer: 42 mL/min — ABNORMAL LOW (ref >60)
GLUCOSE: 130 mg/dL — AB (ref 65–99)
POTASSIUM: 4.4 mmol/L (ref 3.5–5.1)
Sodium: 149 mmol/L — ABNORMAL HIGH (ref 135–145)

## 2018-05-02 LAB — GLUCOSE, CAPILLARY
Glucose-Capillary: 99 mg/dL (ref 65–99)
Glucose-Capillary: 142 mg/dL — ABNORMAL HIGH (ref 65–99)
Glucose-Capillary: 151 mg/dL — ABNORMAL HIGH (ref 65–99)
Glucose-Capillary: 157 mg/dL — ABNORMAL HIGH (ref 65–99)

## 2018-05-02 MED ORDER — AMOXICILLIN-POT CLAVULANATE 500-125 MG PO TABS
1.0000 | ORAL_TABLET | Freq: Two times a day (BID) | ORAL | Status: DC
  Administered 2018-05-02: 500 mg via ORAL

## 2018-05-02 MED ORDER — RESOURCE THICKENUP CLEAR PO POWD
ORAL | Status: DC | PRN
  Filled 2018-05-02: qty 125

## 2018-05-02 MED ORDER — AMOXICILLIN-POT CLAVULANATE 500-125 MG PO TABS
1.0000 | ORAL_TABLET | Freq: Two times a day (BID) | ORAL | Status: AC
  Administered 2018-05-03 – 2018-05-06 (×8): 500 mg via ORAL
  Filled 2018-05-02 (×8): qty 1

## 2018-05-02 NOTE — Progress Notes (Signed)
Family Medicine Teaching Service Daily Progress Note Intern Pager: [phone]  Patient name: [name] Medical record number: [id number] Date of birth: 07/20/1920 Age: 84 y.o. Gender: male  Primary Care Provider: Dickie La, MD Consultants: None Code Status: DNR  Pt Overview and Major Events to Date:  [name] is a 84 y.o. male presenting with abdominal pain. PMH is significant for PAF not on anticoagulation, HTN, T2DM, sinus node dysfunction, CKD III, previous SBO, GERD, diastolic CHF with pulmonary hypertension.  Assessment and Plan:  SBO. Improved. Due to SBO with possible diverticulitis. Less likely diverticulitis as patient has improved greatly although he has been on appropriate antibiotics. I suspect however that is resolution of pain is more attributable to his resolution of SBO via NG tube. Patient has history of previous SBO requiring NG tube, is presenting with abdominal pain and emesis.  CT abdomen showing small bowel obstruction and colonic diverticulosis, possible diverculitis.  - Advance diet - IVF discontinued will consider resuming if po intake continue to be poor - PRN Phenergan  Cough, worsening Patient has had worsening productive cough in the past 24 hour concerning for possible aspiration pneumonia. CXR today shows bilateral opacities with a small right pleural effusion suspicious for PNA. In addition patient has leukocytosis WBC 17.8 from 11.8 yesterday (6/15). Patient also has a drop in O2 sat consistent with CXR findings. --Resume Augmentin 500-125 mg bid --O2 therapy as needed -- Follow up CBC  UTI. Urine culture grew Proteus Mirabalus. Patient does have indwelling foley catheter. U/A did have some leuks and bacteria - Discontinued IV zosyn started in ED (6/11-12) and discont Unasyn (6/13-15) - Started Augmentin today (6/15) but resume today in the setting of worsening respiratory status and new CXR findings.  AKI on CKD. resolved This am creatinine  1.38 down from 1.6 (BL 1.3). back to baseline -Monitor creatinine -Avoid nephrotoxic agents   Diabetes.  Last A1c 5.8 on 08/10/2017. -Monitor CBGs -sSSI  HFpEF with pulm HTN. Stable  BP on admit 111/63 -Holding home spironolactone and lasix for AKI -Monitor blood pressure  Confusion, worsening Increased confusion could be secondary to infection vs delirium in the setting of prolonged hospital course. Family members at bedside with frequent reorientation. Expect improvement with treatment of possible pneumonia.  FEN/GI: soft, thicken liquid PPx: Heparin  Disposition: inpatient  Subjective:  Patient states he feels well. He denies any more abdominal pain and has more appetite but still not eating much. Worsening productive cough in the past 24 hr. Oriented to self, place and time.  Objective: Temp:  [98.2 F (36.8 C)-99.7 F (37.6 C)] 98.5 F (36.9 C) (06/16 1620) Pulse Rate:  [72-89] 88 (06/16 1620) Resp:  [16-21] 21 (06/16 1559) BP: (148-161)/(76-135) 155/78 (06/16 1620) SpO2:  [88 %-100 %] 88 % (06/16 1620) Physical Exam: Gen: Alert and Oriented x 3, NAD HEENT: Normocephalic, atraumatic, PERRLA, EOMI CV: RRR, no murmurs, normal S1, S2 split, +1 pulses dorsalis pedis bilaterally Resp: CTAB, no wheezing, rales, or rhonchi, comfortable work of breathing Abd: non-distended, non-tender, soft, +bs in all four quadrants MSK: Moves all four extremities Ext: no clubbing, cyanosis, or edema Skin: warm, dry, intact, no rashes  Laboratory: Recent Labs  Lab 04/30/18 0723 05/01/18 0521 05/02/18 0925  WBC 13.1* 11.6* 17.8*  HGB 8.4* 8.2* 9.7*  HCT 26.5* 26.5* 32.5*  PLT 385 356 364   Recent Labs  Lab 04/27/18 2125  04/30/18 0723 05/01/18 0521 05/02/18 0925  NA 131*   < > 139  144 149*  K 4.2   < > 3.5 3.0* 4.4  CL 83*   < > 105 107 111  CO2 28   < > 21* 24 26  BUN 69*   < > 53* 37* 31*  CREATININE 3.22*   < > 2.01* 1.60* 1.38*  CALCIUM 8.7*   < > 8.0* 8.3* 8.8*   PROT 6.6  --   --   --   --   BILITOT 0.6  --   --   --   --   ALKPHOS 174*  --   --   --   --   ALT 62  --   --   --   --   AST 34  --   --   --   --   GLUCOSE 201*   < > 130* 130* 130*   < > = values in this interval not displayed.   Lacitc Acid: 2.1  Imaging/Diagnostic Tests:  CT Abd IMPRESSION: 1. Small-bowel obstruction with transition point in the left abdomen, likely due to adhesions. 2. Colonic diverticulosis. Minimal pericolonic fat stranding and trace fluid in the left pericolic gutter may reflect mild diverticular inflammation/diverticulitis. No perforation or abscess. 3. Mild right hydronephrosis and proximal ureter without cause obstruction. Findings may be seen with urinary tract infection. Bladder decompressed by Foley catheter with diffuse bladder wall thickening and perivesicular edema. Recommend correlation with Urinalysis.  KUB IMPRESSION: Tip of the enteric tube below the diaphragm in the stomach, side-port likely just beyond the gastroesophageal junction.  Dg Chest Port 1 View  Result Date: 05/02/2018 CLINICAL DATA:  Aspiration EXAM: PORTABLE CHEST 1 VIEW COMPARISON:  04/27/2018 FINDINGS: Left pacer remains in place, unchanged. Cardiomegaly. Small right pleural effusion. Patchy airspace disease noted throughout the lungs, increased since prior study. No acute bony abnormality. IMPRESSION: Patchy bilateral airspace opacities, increased since prior study. Small right effusion. Stable cardiomegaly. Electronically Signed   By: Rolm Baptise M.D.   On: 05/02/2018 13:02    Marjie Skiff, MD 05/02/2018, 6:21 PM PGY-2,  [place] Intern pager: [phone], text pages welcome

## 2018-05-02 NOTE — Progress Notes (Signed)
CALL PAGER [phone] for any questions or notifications regarding this patient  FMTS Attending Note: [name] MD [name] seems much sleepier and lethargic today. He also has a lot of upper airway and increased lung sounds. His daughter-in-law [name]) said he sounds "like a coffee pot" which is actually pretty accurate. I am worried he has aspirated. CXR shows new opacities. Will treat with augmenin as natural extension of what he was on for sacral ulcer, UTI. His creatinine is bumped a bit so levaquin not best choice.  Hs condition is tenuous and family is aware. I will continue to follow him inpatient.

## 2018-05-02 NOTE — Progress Notes (Signed)
Pt noted to have increased upper and lower extremity tremors. Pt also remains confused. VSS, CBG stable. Pt unable to voice any discomfort. Resident on call notified and will assess at bedside. Will monitor.

## 2018-05-03 DIAGNOSIS — L89309 Pressure ulcer of unspecified buttock, unspecified stage: Secondary | ICD-10-CM

## 2018-05-03 DIAGNOSIS — R1310 Dysphagia, unspecified: Secondary | ICD-10-CM

## 2018-05-03 DIAGNOSIS — K224 Dyskinesia of esophagus: Secondary | ICD-10-CM

## 2018-05-03 LAB — CBC
HEMATOCRIT: 24.4 % — AB (ref 39.0–52.0)
Hemoglobin: 7.8 g/dL — ABNORMAL LOW (ref 13.0–17.0)
MCH: 28.5 pg (ref 26.0–34.0)
MCHC: 32 g/dL (ref 30.0–36.0)
MCV: 89.1 fL (ref 78.0–100.0)
PLATELETS: 267 10*3/uL (ref 150–400)
RBC: 2.74 MIL/uL — ABNORMAL LOW (ref 4.22–5.81)
RDW: 18.6 % — AB (ref 11.5–15.5)
WBC: 22.9 10*3/uL — AB (ref 4.0–10.5)

## 2018-05-03 LAB — GLUCOSE, CAPILLARY
GLUCOSE-CAPILLARY: 81 mg/dL (ref 65–99)
Glucose-Capillary: 126 mg/dL — ABNORMAL HIGH (ref 65–99)
Glucose-Capillary: 131 mg/dL — ABNORMAL HIGH (ref 65–99)
Glucose-Capillary: 137 mg/dL — ABNORMAL HIGH (ref 65–99)

## 2018-05-03 LAB — BASIC METABOLIC PANEL
Anion gap: 9 (ref 5–15)
BUN: 34 mg/dL — AB (ref 6–20)
CHLORIDE: 115 mmol/L — AB (ref 101–111)
CO2: 24 mmol/L (ref 22–32)
CREATININE: 1.38 mg/dL — AB (ref 0.61–1.24)
Calcium: 7.8 mg/dL — ABNORMAL LOW (ref 8.9–10.3)
GFR calc Af Amer: 48 mL/min — ABNORMAL LOW (ref >60)
GFR calc non Af Amer: 42 mL/min — ABNORMAL LOW (ref >60)
GLUCOSE: 132 mg/dL — AB (ref 65–99)
Potassium: 4.6 mmol/L (ref 3.5–5.1)
SODIUM: 148 mmol/L — AB (ref 135–145)

## 2018-05-03 MED ORDER — SODIUM CHLORIDE 0.9 % IV SOLN
INTRAVENOUS | Status: DC
  Administered 2018-05-03 (×2): via INTRAVENOUS
  Administered 2018-05-04: 100 mL/h via INTRAVENOUS
  Administered 2018-05-04 – 2018-05-05 (×2): via INTRAVENOUS

## 2018-05-03 NOTE — Progress Notes (Signed)
CALL PAGER [phone] for any questions or notifications regarding this patient  FMTS Attending Note: [name] MD [name] is still very fatigued and tired today. He is oriented and met]ntally himself, although sometimes hard to understand. I have spoken with his daughter in law [name]. We decided to hold course for today and reasess tomorrow.

## 2018-05-03 NOTE — Progress Notes (Signed)
Nutrition Follow-up  DOCUMENTATION CODES:   Non-severe (moderate) malnutrition in context of chronic illness  INTERVENTION:    Recommend SLP consult due to excessive coughing upon eating/drinking  30 ml Prostat BID, each supplement provides 100 kcals and 15 grams protein.   Magic cup TID with meals, each supplement provides 290 kcal and 9 grams of protein  NUTRITION DIAGNOSIS:   Moderate Malnutrition related to chronic illness as evidenced by moderate muscle depletion, moderate fat depletion.  Ongoing  GOAL:   Patient will meet greater than or equal to 90% of their needs  Not meeting  MONITOR:   Diet advancement, I & O's, Labs  REASON FOR ASSESSMENT:   Malnutrition Screening Tool    ASSESSMENT:   Patient is presenting with abdominal pain and vomiting. CT abdomen showing SBO, Colonic Diverticulosis, possible diverticulitis. PMH is significant for PAF not on anticoagulation, HTN, T2DM, sinus node dysfunction, CKD III, previous SBO, GERD, diastolic CHF with pulmonary hypertension   6/14- clear liquid diet 6/15- advanced to soft diet  SBO noted to be improve. Pt confused upon follow up. RD consulted due to pt needing thickened liquids. Spoke with RN who reports pt coughs upon eating/drinking anything. Pt currently on a soft diet with thin liquids. He usually needs liquids thickened at the facility where he resides. Would recommend an SLP consult to ensure pt is swallowing safely. Intake remains poor with 10% completed at breakfast. Nurse mixing Prostat with coke and pt enjoys it. Will continue with these and add magic cup to maximize protein and calories.    Medications reviewed and include: fentanyl, 40 mEq KCl BID, MS @ 100 ml/hr Labs reviewed: Na 148 (H)   Diet Order:   Diet Order           DIET SOFT Room service appropriate? Yes; Fluid consistency: Thin  Diet effective now          EDUCATION NEEDS:   Not appropriate for education at this time  Skin:  Skin  Assessment: Skin Integrity Issues: Skin Integrity Issues:: Unstageable, Stage II Stage II: to buttocks Unstageable: to sacrum  Last BM:  05/01/18  Height:   Ht Readings from Last 1 Encounters:  04/27/18 5\' 6"  (1.676 m)    Weight:   Wt Readings from Last 1 Encounters:  04/27/18 149 lb 9.6 oz (67.9 kg)    Ideal Body Weight:  64.54 kg  BMI:  Body mass index is 24.15 kg/m.  Estimated Nutritional Needs:   Kcal:  1642 - 1895 calories (MSJ x1.3-1.5)  Protein:  95-108 grams (1.4-1.6g/kg)  Fluid:  >1.7L    Mariana Single RD, LDN Clinical Nutrition Pager # [phone]

## 2018-05-03 NOTE — Progress Notes (Signed)
CSW continuing to follow for needs.  Mehgan Santmyer LCSW [phone]  

## 2018-05-03 NOTE — Progress Notes (Signed)
Family Medicine Teaching Service Daily Progress Note Intern Pager: [phone]  Patient name: [name] Medical record number: [id number] Date of birth: [birth year] Age: 84 y.o. Gender: male  Primary Care Provider: Dickie La, MD Consultants: None Code Status: DNR  Pt Overview and Major Events to Date:  [name] is a 84 y.o. male presenting with abdominal pain. PMH is significant for PAF not on anticoagulation, HTN, T2DM, sinus node dysfunction, CKD III, previous SBO, GERD, diastolic CHF with pulmonary hypertension.  Assessment and Plan:  SBO. Improved. Due to SBO with possible diverticulitis. Less likely diverticulitis as patient has improved greatly although he has been on appropriate antibiotics. I suspect however that is resolution of pain is more attributable to his resolution of SBO via NG tube. Patient has history of previous SBO requiring NG tube, is presenting with abdominal pain and emesis.  CT abdomen showing small bowel obstruction and colonic diverticulosis, possible diverculitis.  - Thickened liquids - Restart IVF NS @ maintenance rate  - PRN Phenergan  Cough, worsening Patient has had worsening productive cough in the past 24 hour concerning for possible aspiration pneumonia. CXR shows bilateral opacities with a small right pleural effusion suspicious for PNA. In addition patient has leukocytosis WBC 17.8 up to 22.9 today (6/17). Patient also has a drop in O2 sat consistent with CXR findings. --Cont Augmentin 500-125 mg bid (6/15-). Patient has recieved total of 6 days of antiobiotics, plan to treat for 10 --O2 therapy as needed -- Follow up CBC  UTI. Resolved. Urine culture grew Proteus Mirabalus. Patient does have indwelling foley catheter. U/A did have some leuks and bacteria. Treated adequately with IV antibiotics. - Discontinued IV zosyn started in ED (6/11-12) and discont Unasyn (6/13-15) - Started Augmentin on 6/15 and then discontinued but resumed on 6/16  in the setting of worsening respiratory status and new CXR findings.  AKI on CKD. resolved This am creatinine 1.38 down from 1.6 (BL 1.3). back to baseline -Monitor creatinine -Avoid nephrotoxic agents   Diabetes.  Last A1c 5.8 on 08/10/2017. -Monitor CBGs -sSSI  HFpEF with pulm HTN. Stable  BP on admit 111/63 -Holding home spironolactone and lasix for AKI -Monitor blood pressure  Confusion, worsening Increased confusion could be secondary to infection vs delirium in the setting of prolonged hospital course. Family members at bedside with frequent reorientation. Expect improvement with treatment of possible pneumonia.  FEN/GI: soft, thicken liquid PPx: Heparin  Disposition: inpatient  Subjective:  Patient states he feels "worn out". He has a dry cough and is on 3L of O2. Normally, on 6L in [place]. Patient is oriented to person and place and time this morning. He states he has mild belly pain. He is not eating much and states his mouth is dry and his sacral ulcer is bothering him.  Objective: Temp:  [97.4 F (36.3 C)-99.7 F (37.6 C)] 97.4 F (36.3 C) (06/17 0521) Pulse Rate:  [46-88] 75 (06/17 0521) Resp:  [21-24] 24 (06/17 0521) BP: (130-155)/(66-135) 130/66 (06/17 0521) SpO2:  [88 %-97 %] 97 % (06/17 0521) Physical Exam: Gen: Alert and Oriented x 3, NAD, frail appearing HEENT: Normocephalic, atraumatic, PERRLA, EOMI CV: Irregularly Irregular, normal rate, no murmurs, normal S1, S2 split Resp: Decreased breath sounds bilaterally, no wheezing, rales, or rhonchi Abd: non-distended, non-tender, soft, +bs in all four quadrants MSK: Moves all four extremities Ext: no clubbing, cyanosis, or edema Skin: warm, dry, intact, no rashes  Laboratory: Recent Labs  Lab 05/01/18 0521 05/02/18 0925 05/03/18 6213  WBC 11.6* 17.8* 22.9*  HGB 8.2* 9.7* 7.8*  HCT 26.5* 32.5* 24.4*  PLT 356 364 267   Recent Labs  Lab 04/27/18 2125  05/01/18 0521 05/02/18 0925 05/03/18 0708   NA 131*   < > 144 149* 148*  K 4.2   < > 3.0* 4.4 4.6  CL 83*   < > 107 111 115*  CO2 28   < > 24 26 24   BUN 69*   < > 37* 31* 34*  CREATININE 3.22*   < > 1.60* 1.38* 1.38*  CALCIUM 8.7*   < > 8.3* 8.8* 7.8*  PROT 6.6  --   --   --   --   BILITOT 0.6  --   --   --   --   ALKPHOS 174*  --   --   --   --   ALT 62  --   --   --   --   AST 34  --   --   --   --   GLUCOSE 201*   < > 130* 130* 132*   < > = values in this interval not displayed.   Lacitc Acid: 2.1  Imaging/Diagnostic Tests:  CT Abd IMPRESSION: 1. Small-bowel obstruction with transition point in the left abdomen, likely due to adhesions. 2. Colonic diverticulosis. Minimal pericolonic fat stranding and trace fluid in the left pericolic gutter may reflect mild diverticular inflammation/diverticulitis. No perforation or abscess. 3. Mild right hydronephrosis and proximal ureter without cause obstruction. Findings may be seen with urinary tract infection. Bladder decompressed by Foley catheter with diffuse bladder wall thickening and perivesicular edema. Recommend correlation with Urinalysis.  KUB IMPRESSION: Tip of the enteric tube below the diaphragm in the stomach, side-port likely just beyond the gastroesophageal junction.  Dg Chest Port 1 View  Result Date: 05/02/2018 CLINICAL DATA:  Aspiration EXAM: PORTABLE CHEST 1 VIEW COMPARISON:  04/27/2018 FINDINGS: Left pacer remains in place, unchanged. Cardiomegaly. Small right pleural effusion. Patchy airspace disease noted throughout the lungs, increased since prior study. No acute bony abnormality. IMPRESSION: Patchy bilateral airspace opacities, increased since prior study. Small right effusion. Stable cardiomegaly. Electronically Signed   By: Rolm Baptise M.D.   On: 05/02/2018 13:02    [name], DO 05/03/2018, 7:00 AM PGY-1,  [place] Intern pager: [phone], text pages welcome

## 2018-05-03 NOTE — Progress Notes (Signed)
CSW continuing to follow for placement needs.   Percell Locus Saher Davee LCSW [phone]

## 2018-05-04 ENCOUNTER — Inpatient Hospital Stay (HOSPITAL_COMMUNITY): Payer: Medicare Other

## 2018-05-04 LAB — CBC
HEMATOCRIT: 27.3 % — AB (ref 39.0–52.0)
Hemoglobin: 8.2 g/dL — ABNORMAL LOW (ref 13.0–17.0)
MCH: 27.9 pg (ref 26.0–34.0)
MCHC: 30 g/dL (ref 30.0–36.0)
MCV: 92.9 fL (ref 78.0–100.0)
Platelets: 272 10*3/uL (ref 150–400)
RBC: 2.94 MIL/uL — ABNORMAL LOW (ref 4.22–5.81)
RDW: 19 % — ABNORMAL HIGH (ref 11.5–15.5)
WBC: 18.5 10*3/uL — ABNORMAL HIGH (ref 4.0–10.5)

## 2018-05-04 LAB — BASIC METABOLIC PANEL
Anion gap: 9 (ref 5–15)
BUN: 30 mg/dL — AB (ref 6–20)
CHLORIDE: 116 mmol/L — AB (ref 101–111)
CO2: 24 mmol/L (ref 22–32)
CREATININE: 1.27 mg/dL — AB (ref 0.61–1.24)
Calcium: 8 mg/dL — ABNORMAL LOW (ref 8.9–10.3)
GFR calc Af Amer: 54 mL/min — ABNORMAL LOW (ref >60)
GFR calc non Af Amer: 46 mL/min — ABNORMAL LOW (ref >60)
Glucose, Bld: 166 mg/dL — ABNORMAL HIGH (ref 65–99)
Potassium: 4.1 mmol/L (ref 3.5–5.1)
Sodium: 149 mmol/L — ABNORMAL HIGH (ref 135–145)

## 2018-05-04 LAB — GLUCOSE, CAPILLARY
Glucose-Capillary: 145 mg/dL — ABNORMAL HIGH (ref 65–99)
Glucose-Capillary: 127 mg/dL — ABNORMAL HIGH (ref 65–99)

## 2018-05-04 NOTE — Progress Notes (Signed)
Family Medicine Teaching Service Daily Progress Note Intern Pager: [phone]  Patient name: [name] Medical record number: [id number] Date of birth: [birth year] Age: 84 y.o. Gender: male  Primary Care Provider: Dickie La, MD Consultants: None Code Status: DNR  Pt Overview and Major Events to Date:  [name] is a 84 y.o. male presenting with abdominal pain. PMH is significant for PAF not on anticoagulation, HTN, T2DM, sinus node dysfunction, CKD III, previous SBO, GERD, diastolic CHF with pulmonary hypertension.  Assessment and Plan:  SBO, resolved.  Pain is improved and NG tube is out. - regular diet, no thickener so that patient will be more comfortable - Restart IVF NS @ maintenance rate  - PRN Phenergan suppository  Aspiration pneumonia. CXR shows bilateral opacities with a small right pleural effusion suspicious for PNA.  Aspiration will likely continue, and there are no effective interventions for this issue.  Antibiotics are being continued after discussion between PCP and family.  --Cont Augmentin 500-125 mg bid (6/15-). Patient has recieved total of 6 days of antiobiotics, plan to treat for 10 (end date 6/20) --O2 therapy as needed -- Follow up CBC --CXR    Diabetes.  Last A1c 5.8 on 08/10/2017. - CBGs have been well controlled during hospitalization, so will discontinue sSSI and CBG checks  HFpEF with pulm HTN. Stable, blood pressure 127/71 on 6/18 -Holding home spironolactone and lasix for AKI -Monitor blood pressure  Confusion, worsening Increased confusion could be secondary to infection vs delirium in the setting of prolonged hospital course. Family members at bedside with frequent reorientation. Will likely have a waxing and waning course with probable worsening given terminal trajectory.  FEN/GI: regular PPx: Heparin  Disposition: inpatient  Subjective:  Patient states he feels like he has something in his lungs but denies pain.  He wants a  cold coke and does not want to drink the thickened coke at bedside.  Objective: Temp:  [97.7 F (36.5 C)-98.3 F (36.8 C)] 98.2 F (36.8 C) (06/18 0533) Pulse Rate:  [51-76] 66 (06/18 0533) Resp:  [18-20] 18 (06/18 0533) BP: (127-161)/(71-80) 127/71 (06/18 0533) SpO2:  [97 %-100 %] 97 % (06/18 0533) Physical Exam: Gen: NAD, lying in bed with [place] in place HEENT: Normocephalic, atraumatic, EOMI, hard of hearing CV: Irregularly Irregular, normal rate, no murmurs Resp: Decreased breath sounds bilaterally, no wheezing, rales, or rhonchi Abd: non-distended, non-tender, soft, +bs in all four quadrants MSK: Moves all four extremities Ext: no clubbing, cyanosis, or edema Skin: warm, dry, intact, no rashes  Laboratory: Recent Labs  Lab 05/01/18 0521 05/02/18 0925 05/03/18 0708  WBC 11.6* 17.8* 22.9*  HGB 8.2* 9.7* 7.8*  HCT 26.5* 32.5* 24.4*  PLT 356 364 267   Recent Labs  Lab 04/27/18 2125  05/01/18 0521 05/02/18 0925 05/03/18 0708  NA 131*   < > 144 149* 148*  K 4.2   < > 3.0* 4.4 4.6  CL 83*   < > 107 111 115*  CO2 28   < > 24 26 24   BUN 69*   < > 37* 31* 34*  CREATININE 3.22*   < > 1.60* 1.38* 1.38*  CALCIUM 8.7*   < > 8.3* 8.8* 7.8*  PROT 6.6  --   --   --   --   BILITOT 0.6  --   --   --   --   ALKPHOS 174*  --   --   --   --   ALT 62  --   --   --   --  AST 34  --   --   --   --   GLUCOSE 201*   < > 130* 130* 132*   < > = values in this interval not displayed.   Lacitc Acid: 2.1  Imaging/Diagnostic Tests:  CT Abd IMPRESSION: 1. Small-bowel obstruction with transition point in the left abdomen, likely due to adhesions. 2. Colonic diverticulosis. Minimal pericolonic fat stranding and trace fluid in the left pericolic gutter may reflect mild diverticular inflammation/diverticulitis. No perforation or abscess. 3. Mild right hydronephrosis and proximal ureter without cause obstruction. Findings may be seen with urinary tract infection. Bladder decompressed by  Foley catheter with diffuse bladder wall thickening and perivesicular edema. Recommend correlation with Urinalysis.  KUB IMPRESSION: Tip of the enteric tube below the diaphragm in the stomach, side-port likely just beyond the gastroesophageal junction.  No results found.  Kathrene Alu, MD 05/04/2018, 7:10 AM PGY-1,  [place] Intern pager: [phone], text pages welcome

## 2018-05-05 LAB — CBC
HCT: 29.5 % — ABNORMAL LOW (ref 39.0–52.0)
Hemoglobin: 8.8 g/dL — ABNORMAL LOW (ref 13.0–17.0)
MCH: 28 pg (ref 26.0–34.0)
MCHC: 29.8 g/dL — AB (ref 30.0–36.0)
MCV: 93.9 fL (ref 78.0–100.0)
Platelets: 284 10*3/uL (ref 150–400)
RBC: 3.14 MIL/uL — ABNORMAL LOW (ref 4.22–5.81)
RDW: 19.2 % — AB (ref 11.5–15.5)
WBC: 20.1 10*3/uL — ABNORMAL HIGH (ref 4.0–10.5)

## 2018-05-05 MED ORDER — ORAL CARE MOUTH RINSE
15.0000 mL | Freq: Two times a day (BID) | OROMUCOSAL | Status: DC
  Administered 2018-05-05 – 2018-05-08 (×7): 15 mL via OROMUCOSAL

## 2018-05-05 MED ORDER — ENSURE ENLIVE PO LIQD
237.0000 mL | Freq: Two times a day (BID) | ORAL | Status: DC
  Administered 2018-05-05 – 2018-05-08 (×4): 237 mL via ORAL

## 2018-05-05 MED ORDER — SODIUM CHLORIDE 0.45 % IV SOLN
INTRAVENOUS | Status: DC
  Administered 2018-05-05 – 2018-05-06 (×2): via INTRAVENOUS

## 2018-05-05 NOTE — Progress Notes (Addendum)
Family Medicine Teaching Service Daily Progress Note Intern Pager: [phone]  Patient name: [name] Medical record number: [id number] Date of birth: [birth year] Age: 84 y.o. Gender: male  Primary Care Provider: Dickie La, MD Consultants: None Code Status: DNR  Pt Overview and Major Events to Date:  [name] is a 84 y.o. male presenting with abdominal pain. PMH is significant for PAF not on anticoagulation, HTN, T2DM, sinus node dysfunction, CKD III, previous SBO, GERD, diastolic CHF with pulmonary hypertension.  Assessment and Plan:  SBO, resolved.  Pain is improved and NG tube is out. - regular diet, no thickener so that patient will be more comfortable  Electrolyte Abnormalities. Hypernatremia and Hypochloridemia. Hypernatremia is stable at 148-149. Likely due to NS fluids running at maintenance rate. - Change fluids to 0.45NS @ 1/2 maintenance rate  - PRN Phenergan suppository  Aspiration pneumonia. CXR shows bilateral opacities with a small right pleural effusion suspicious for PNA.  Aspiration will likely continue, and there are no effective interventions for this issue.  Antibiotics are being continued after discussion between PCP and family.  --Cont Augmentin 500-125 mg bid (6/15-). Patient has recieved total of 9 days of antiobiotics, plan to treat for 10 (end date 6/20) --O2 therapy as needed -- Follow up CBC --CXR    Diabetes.  Last A1c 5.8 on 08/10/2017. - CBGs have been well controlled during hospitalization, so will discontinue sSSI and CBG checks  HFpEF with pulm HTN. Stable, acutely elevated blood pressure 152/94 on 6/19 -Cont holding home spironolactone and lasix as BP has been low or normal on most checks -Monitor blood pressure  Confusion, worsening Increased confusion could be secondary to infection vs delirium in the setting of prolonged hospital course. Family members at bedside with frequent reorientation. Will likely have a waxing and waning  course with probable worsening given terminal trajectory.  FEN/GI: regular PPx: Heparin  Disposition: inpatient  Subjective:  Patient states he feels "okay" this morning. He is oriented to person, place this morning. He is still coughing and states he has pain when he coughs. He has no other complaints and states he is thirsty. No family at bedside this am.  Objective: Temp:  [97.7 F (36.5 C)-98 F (36.7 C)] 97.8 F (36.6 C) (06/19 0515) Pulse Rate:  [65-118] 65 (06/19 0515) Resp:  [18-20] 18 (06/19 0515) BP: (119-152)/(60-105) 152/94 (06/19 0515) SpO2:  [96 %-98 %] 96 % (06/19 0515) Physical Exam: Gen: NAD, lying in bed resting comfortably HEENT: Normocephalic, atraumatic, EOMI, hard of hearing CV: Irregularly Irregular, normal rate, no murmurs Resp: Decreased breath sounds bilaterally, no wheezing, rales, or rhonchi Abd: non-distended, non-tender, soft, +bs in all four quadrants MSK: Moves all four extremities Ext: no clubbing, cyanosis, or edema Skin: warm, dry, intact, no rashes  Laboratory: Recent Labs  Lab 05/03/18 0708 05/04/18 0724 05/05/18 0952  WBC 22.9* 18.5* 20.1*  HGB 7.8* 8.2* 8.8*  HCT 24.4* 27.3* 29.5*  PLT 267 272 284   Recent Labs  Lab 05/02/18 0925 05/03/18 0708 05/04/18 0724  NA 149* 148* 149*  K 4.4 4.6 4.1  CL 111 115* 116*  CO2 26 24 24   BUN 31* 34* 30*  CREATININE 1.38* 1.38* 1.27*  CALCIUM 8.8* 7.8* 8.0*  GLUCOSE 130* 132* 166*   Lacitc Acid: 2.1  Imaging/Diagnostic Tests:  CT Abd IMPRESSION: 1. Small-bowel obstruction with transition point in the left abdomen, likely due to adhesions. 2. Colonic diverticulosis. Minimal pericolonic fat stranding and trace fluid in the left  pericolic gutter may reflect mild diverticular inflammation/diverticulitis. No perforation or abscess. 3. Mild right hydronephrosis and proximal ureter without cause obstruction. Findings may be seen with urinary tract infection. Bladder decompressed by  Foley catheter with diffuse bladder wall thickening and perivesicular edema. Recommend correlation with Urinalysis.  KUB IMPRESSION: Tip of the enteric tube below the diaphragm in the stomach, side-port likely just beyond the gastroesophageal junction.  Dg Chest Port 1 View  Result Date: 05/04/2018 CLINICAL DATA:  Cough and congestion. EXAM: PORTABLE CHEST 1 VIEW COMPARISON:  05/02/2018. FINDINGS: Cardiac pacer with lead tip over the right atrium right ventricle. Stable cardiomegaly. Patchy bilateral pulmonary infiltrates/edema. Small bilateral pleural effusions. No pneumothorax. IMPRESSION: 1. Cardiac pacer with lead tips over the right atrium right ventricle. Stable cardiomegaly. 2. Patchy bilateral pulmonary infiltrates/edema. Small bilateral pleural effusions. Similar findings noted on prior exam. Electronically Signed   By: Marcello Moores  Register   On: 05/04/2018 10:20    [name], DO 05/05/2018, 7:31 AM PGY-1,  [place] Intern pager: [phone], text pages welcome

## 2018-05-06 LAB — BASIC METABOLIC PANEL
Anion gap: 8 (ref 5–15)
BUN: 26 mg/dL — ABNORMAL HIGH (ref 6–20)
CALCIUM: 7.7 mg/dL — AB (ref 8.9–10.3)
CO2: 23 mmol/L (ref 22–32)
CREATININE: 1.02 mg/dL (ref 0.61–1.24)
Chloride: 117 mmol/L — ABNORMAL HIGH (ref 101–111)
GFR calc non Af Amer: >60 mL/min (ref >60)
Glucose, Bld: 103 mg/dL — ABNORMAL HIGH (ref 65–99)
Potassium: 4.3 mmol/L (ref 3.5–5.1)
SODIUM: 148 mmol/L — AB (ref 135–145)

## 2018-05-06 LAB — CBC
HCT: 27.4 % — ABNORMAL LOW (ref 39.0–52.0)
Hemoglobin: 8.2 g/dL — ABNORMAL LOW (ref 13.0–17.0)
MCH: 28 pg (ref 26.0–34.0)
MCHC: 29.9 g/dL — ABNORMAL LOW (ref 30.0–36.0)
MCV: 93.5 fL (ref 78.0–100.0)
PLATELETS: 239 10*3/uL (ref 150–400)
RBC: 2.93 MIL/uL — AB (ref 4.22–5.81)
RDW: 18.8 % — ABNORMAL HIGH (ref 11.5–15.5)
WBC: 15.8 10*3/uL — AB (ref 4.0–10.5)

## 2018-05-06 MED ORDER — SODIUM CHLORIDE 0.45 % IV SOLN
INTRAVENOUS | Status: DC
  Administered 2018-05-06 – 2018-05-08 (×3): via INTRAVENOUS

## 2018-05-06 MED ORDER — BOOST / RESOURCE BREEZE PO LIQD CUSTOM
1.0000 | Freq: Three times a day (TID) | ORAL | Status: DC
  Administered 2018-05-07 – 2018-05-08 (×3): 1 via ORAL

## 2018-05-06 NOTE — Progress Notes (Signed)
Nutrition Follow-up  DOCUMENTATION CODES:   Non-severe (moderate) malnutrition in context of chronic illness  INTERVENTION:  Continue Ensure Enlive po BID, each supplement provides 350 kcal and 20 grams of protein  Continue 87mL Prostat daily, each supplement provides 100 calories and 15 grams of protein  Add Boost Breeze po TID, each supplement provides 250 kcal and 9 grams of protein  D/C Magic cup TID   NUTRITION DIAGNOSIS:   Moderate Malnutrition related to chronic illness as evidenced by moderate muscle depletion, moderate fat depletion. -ongoing  GOAL:   Patient will meet greater than or equal to 90% of their needs -unmet  MONITOR:   Diet advancement, I & O's, Labs  REASON FOR ASSESSMENT:   Malnutrition Screening Tool    ASSESSMENT:   Patient is presenting with abdominal pain and vomiting. CT abdomen showing SBO, Colonic Diverticulosis, possible diverticulitis. PMH is significant for PAF not on anticoagulation, HTN, T2DM, sinus node dysfunction, CKD III, previous SBO, GERD, diastolic CHF with pulmonary hypertension  Patient eats 2-3 bites of meals. Drinking Ensure daily. Ordered boost breeze to allow some variation in what he is consuming. Patient has been struggling to eat anything that isn't soft. Continues to aspirate. No thickener for dysphagia diet per MD.  Labs reviewed:  Na 148  Medications reviewed and include:  40K+ BID 1/2 NS at 61mL/hr    Diet Order:   Diet Order           Diet regular Room service appropriate? Yes; Fluid consistency: Thin  Diet effective now          EDUCATION NEEDS:   Not appropriate for education at this time  Skin:  Skin Assessment: Skin Integrity Issues: Skin Integrity Issues:: Unstageable, Stage II Stage II: to buttocks Unstageable: to sacrum  Last BM:  05/01/18  Height:   Ht Readings from Last 1 Encounters:  04/27/18 5\' 6"  (1.676 m)    Weight:   Wt Readings from Last 1 Encounters:  04/27/18 149 lb  9.6 oz (67.9 kg)    Ideal Body Weight:  64.54 kg  BMI:  Body mass index is 24.15 kg/m.  Estimated Nutritional Needs:   Kcal:  1642 - 1895 calories (MSJ x1.3-1.5)  Protein:  95-108 grams (1.4-1.6g/kg)  Fluid:  >1.7L    Satira Anis. Keyari Kleeman, MS, RD LDN Inpatient Clinical Dietitian Pager [phone]

## 2018-05-06 NOTE — Progress Notes (Signed)
Family Medicine Teaching Service Daily Progress Note Intern Pager: [phone]  Patient name: [name] Medical record number: [id number] Date of birth: 03-13-1920 Age: 84 y.o. Gender: male  Primary Care Provider: Dickie La, MD Consultants: None Code Status: DNR  Pt Overview and Major Events to Date:  [name] is a 84 y.o. male presenting with abdominal pain. PMH is significant for PAF not on anticoagulation, HTN, T2DM, sinus node dysfunction, CKD III, previous SBO, GERD, diastolic CHF with pulmonary hypertension.  Assessment and Plan:  SBO, resolved.  Pain is improved and NG tube is out. - regular diet, no thickener so that patient will be more comfortable - PRN Phenergan suppository  Electrolyte Abnormalities. Hypernatremia and Hypochloridemia. Hypernatremia is stable at 148-149 for past 3 days. - Discont 0.45NS @ 1/2 maintenance rate   Aspiration pneumonia. CXR shows bilateral opacities with a small right pleural effusion suspicious for PNA.  Aspiration will likely continue, and there are no effective interventions for this issue.  Antibiotics are being continued after discussion between PCP and family.  --Discont Augmentin 500-125 mg bid (6/15-). Patient has recieved total of 10 days of antiobiotics, plan to treat for 10 (end date 6/20)  --O2 therapy as needed -- Follow up CBC   Diabetes.  Last A1c 5.8 on 08/10/2017. - CBGs have been well controlled during hospitalization, so will discontinue sSSI and CBG checks  HFpEF with pulm HTN. Stable, acutely elevated blood pressure 152/94 on 6/19 -Cont holding home spironolactone and lasix as BP has been low or normal on most checks -Monitor blood pressure  Confusion, worsening Increased confusion could be secondary to infection vs delirium in the setting of prolonged hospital course. Family members at bedside with frequent reorientation. Will likely have a waxing and waning course with probable worsening given terminal  trajectory.  FEN/GI: regular PPx: Heparin  Disposition: inpatient  Subjective:  Patient states he feels sore this morning as he has not been able to get comfortable in the bed. He states his coughing has improved. He has not other complaints this morning.  Objective: Temp:  [98 F (36.7 C)-98.7 F (37.1 C)] 98.7 F (37.1 C) (06/19 2121) Pulse Rate:  [74-96] 92 (06/20 0558) Resp:  [20] 20 (06/19 1342) BP: (129-139)/(54-69) 129/66 (06/20 0558) SpO2:  [91 %-96 %] 94 % (06/20 0558)   Physical Exam: Gen: Alert and Oriented x 3, NAD HEENT: Normocephalic, atraumatic, PERRLA, EOMI, has hearing aids in place CV: Irregularly Irregular, no murmurs, normal S1, S2 split, +2 pulses dorsalis pedis bilaterally Resp: CTAB, no wheezing, rales, or rhonchi, comfortable work of breathing Abd: non-distended, non-tender, soft, +bs in all four quadrants MSK: Moves all four extremities Ext: no clubbing, cyanosis, or edema Skin: warm, dry, intact, no rashes  Laboratory: Recent Labs  Lab 05/04/18 0724 05/05/18 0952 05/06/18 0409  WBC 18.5* 20.1* 15.8*  HGB 8.2* 8.8* 8.2*  HCT 27.3* 29.5* 27.4*  PLT 272 284 239   Recent Labs  Lab 05/03/18 0708 05/04/18 0724 05/06/18 0409  NA 148* 149* 148*  K 4.6 4.1 4.3  CL 115* 116* 117*  CO2 24 24 23   BUN 34* 30* 26*  CREATININE 1.38* 1.27* 1.02  CALCIUM 7.8* 8.0* 7.7*  GLUCOSE 132* 166* 103*   Lacitc Acid: 2.1  Imaging/Diagnostic Tests:  CT Abd IMPRESSION: 1. Small-bowel obstruction with transition point in the left abdomen, likely due to adhesions. 2. Colonic diverticulosis. Minimal pericolonic fat stranding and trace fluid in the left pericolic gutter may reflect mild diverticular  inflammation/diverticulitis. No perforation or abscess. 3. Mild right hydronephrosis and proximal ureter without cause obstruction. Findings may be seen with urinary tract infection. Bladder decompressed by Foley catheter with diffuse bladder wall thickening  and perivesicular edema. Recommend correlation with Urinalysis.  KUB IMPRESSION: Tip of the enteric tube below the diaphragm in the stomach, side-port likely just beyond the gastroesophageal junction.  No results found.  [name], DO 05/06/2018, 6:51 AM PGY-1,  [place] Intern pager: [phone], text pages welcome

## 2018-05-07 LAB — BASIC METABOLIC PANEL
Anion gap: 7 (ref 5–15)
BUN: 21 mg/dL — AB (ref 6–20)
CHLORIDE: 113 mmol/L — AB (ref 101–111)
CO2: 23 mmol/L (ref 22–32)
Calcium: 7.5 mg/dL — ABNORMAL LOW (ref 8.9–10.3)
Creatinine, Ser: 0.91 mg/dL (ref 0.61–1.24)
GFR calc Af Amer: >60 mL/min (ref >60)
GLUCOSE: 84 mg/dL (ref 65–99)
POTASSIUM: 4.8 mmol/L (ref 3.5–5.1)
Sodium: 143 mmol/L (ref 135–145)

## 2018-05-07 LAB — CBC
HCT: 27 % — ABNORMAL LOW (ref 39.0–52.0)
Hemoglobin: 8.1 g/dL — ABNORMAL LOW (ref 13.0–17.0)
MCH: 28.1 pg (ref 26.0–34.0)
MCHC: 30 g/dL (ref 30.0–36.0)
MCV: 93.8 fL (ref 78.0–100.0)
PLATELETS: 264 10*3/uL (ref 150–400)
RBC: 2.88 MIL/uL — AB (ref 4.22–5.81)
RDW: 18.8 % — ABNORMAL HIGH (ref 11.5–15.5)
WBC: 14 10*3/uL — ABNORMAL HIGH (ref 4.0–10.5)

## 2018-05-07 LAB — GLUCOSE, CAPILLARY: Glucose-Capillary: 87 mg/dL (ref 65–99)

## 2018-05-07 NOTE — Progress Notes (Signed)
Patient was discussed with Medical Advisor. CSW continuing to follow for discharge back to [facility] versus residential hospice. Hopeful for family decision today.  [name] Euriah Matlack LCSW [phone]

## 2018-05-07 NOTE — NC FL2 (Signed)
[place] LEVEL OF CARE SCREENING TOOL     IDENTIFICATION  Patient Name: [name] Birthdate: [birth year] Sex: male Admission Date (Current Location): 04/27/2018  [facility] and [place] Number:  Herbalist and Address:  [facility], [place] [address], [place], [place] [address]      Provider Number: [id number]  Attending Physician Name and Address:  Zenia Resides, MD  Relative Name and Phone Number:  Janean Sark, daughter-in-law, [phone]    Current Level of Care: Hospital Recommended Level of Care: [place] Prior Approval Number:    Date Approved/Denied:   PASRR Number: [id number]  Discharge Plan: SNF    Current Diagnoses: Patient Active Problem List   Diagnosis Date Noted  . Bowel obstruction ([place]) 04/28/2018  . Pressure injury of skin 04/28/2018  . Malnutrition of moderate degree 04/28/2018  . Pulmonary artery hypertension ([place]) 01/16/2018  . Acute diastolic congestive heart failure ([place]) 01/16/2018  . Heart failure with preserved ejection fraction ([place]) 01/16/2018  . Hypoxemia 01/14/2018  . Esophageal dysmotility 01/14/2018  . Cough with sputum 01/14/2018  . Leukocytosis 01/14/2018  . Elevated brain natriuretic peptide (BNP) level 01/14/2018  . Esophageal dysphagia 01/14/2018  . Bilateral pulmonary infiltrates on chest x-ray 01/14/2018  . Prolonged Q-T interval on ECG 01/14/2018  . Aspiration pneumonia of both upper lobes due to regurgitated food ([place])   . Postop check   . Elective surgery   . Normocytic anemia   . Essential hypertension   . Second degree AV block 11/29/2014  . Abnormal esophagram 09/07/2014  . Dysphagia 08/23/2014  . Pacemaker - Medtronic Adapta 2006, gen change 2015 07/23/2013  . Chronic right hip pain 09/22/2012  . Testicular swelling, right 09/22/2012  . Presbycusis 05/14/2012  . Paroxysmal atrial fibrillation ([place]) 01/30/2012  . Hx SBO 01/30/2012  . S/P appendectomy  01/30/2012  . CHRONIC KIDNEY DISEASE STAGE III (MODERATE) 12/03/2010  . ALLERGIC RHINITIS 09/05/2009  . HOARSENESS 05/15/2009  . HIATAL HERNIA 12/14/2008  . Diverticulosis of large intestine 12/14/2008  . COLONIC POLYPS, HYPERPLASTIC, HX OF 12/14/2008  . ESOPHAGEAL STRICTURE 10/04/2008  . Other vitamin B12 deficiency anemia 01/26/2008  . GLAUCOMA 01/14/2007  . CATARACT 01/14/2007  . SICK SINUS SYNDROME 01/14/2007  . GASTROESOPHAGEAL REFLUX, NO ESOPHAGITIS 01/14/2007    Orientation RESPIRATION BLADDER Height & Weight     Self, Time, Situation, Place  O2(Nasal cannula 3L) Continent, Indwelling catheter Weight: 67.9 kg (149 lb 9.6 oz) Height:  5\' 6"  (167.6 cm)  BEHAVIORAL SYMPTOMS/MOOD NEUROLOGICAL BOWEL NUTRITION STATUS      Incontinent Diet(Please see DC Summary)  AMBULATORY STATUS COMMUNICATION OF NEEDS Skin   Extensive Assist Verbally PU Stage and Appropriate Care                       Personal Care Assistance Level of Assistance  Bathing, Feeding, Dressing Bathing Assistance: Maximum assistance Feeding assistance: Maximum assistance Dressing Assistance: Maximum assistance     Functional Limitations Info  Sight, Hearing, Speech Sight Info: Impaired Hearing Info: Impaired Speech Info: Adequate    SPECIAL CARE FACTORS FREQUENCY                       Contractures Contractures Info: Not present    Additional Factors Info  Code Status, Allergies, Isolation Precautions Code Status Info: DNR Allergies Info: Codeine     Isolation Precautions Info: MRSA in the nose     Current Medications (05/07/2018):  This is the current hospital active medication list Current Facility-Administered Medications  Medication Dose Route Frequency Provider Last Rate Last Dose  . 0.45 % sodium chloride infusion   Intravenous Continuous Kathrene Alu, MD 50 mL/hr at 05/07/18 9166    . acetaminophen (TYLENOL) tablet 650 mg  650 mg Oral Q6H PRN Bufford Lope, DO   650 mg at  05/01/18 2106   Or  . acetaminophen (TYLENOL) suppository 650 mg  650 mg Rectal Q6H PRN Bufford Lope, DO      . feeding supplement (BOOST / RESOURCE BREEZE) liquid 1 Container  1 Container Oral TID BM Hensel, Jamal Collin, MD      . feeding supplement (ENSURE ENLIVE) (ENSURE ENLIVE) liquid 237 mL  237 mL Oral BID BM Zenia Resides, MD   237 mL at 05/06/18 1549  . feeding supplement (PRO-STAT SUGAR FREE 64) liquid 30 mL  30 mL Oral Daily Orson Eva J, DO   30 mL at 05/06/18 0917  . fentaNYL (DURAGESIC - dosed mcg/hr) 12.5 mcg  12.5 mcg Transdermal Q72H Lockamy, Timothy, DO   12.5 mcg at 05/05/18 1045  . heparin injection 5,000 Units  5,000 Units Subcutaneous Q8H Orson Eva J, DO   5,000 Units at 05/06/18 2317  . MEDLINE mouth rinse  15 mL Mouth Rinse BID Zenia Resides, MD   15 mL at 05/06/18 2138  . polyethylene glycol (MIRALAX / GLYCOLAX) packet 17 g  17 g Oral Daily PRN Orson Eva J, DO      . potassium chloride SA (K-DUR,KLOR-CON) CR tablet 40 mEq  40 mEq Oral BID Lockamy, Timothy, DO   40 mEq at 05/06/18 2132  . promethazine (PHENERGAN) suppository 25 mg  25 mg Rectal Q12H PRN Bufford Lope, DO         Discharge Medications: Please see discharge summary for a list of discharge medications.  Relevant Imaging Results:  Relevant Lab Results:   Additional Information SSN: [id number]  Benard Halsted, LCSWA

## 2018-05-07 NOTE — Progress Notes (Signed)
Family Medicine Teaching Service Daily Progress Note Intern Pager: [phone]  Patient name: [name] Medical record number: [id number] Date of birth: [birth year] Age: 84 y.o. Gender: male  Primary Care Provider: Dickie La, MD Consultants: None Code Status: DNR  Pt Overview and Major Events to Date:  [name] is a 84 y.o. male presenting with abdominal pain. PMH is significant for PAF not on anticoagulation, HTN, T2DM, sinus node dysfunction, CKD III, previous SBO, GERD, diastolic CHF with pulmonary hypertension.  Assessment and Plan:  SBO, resolved.  Pain is improved and NG tube is out. - regular diet, no thickener so that patient will be more comfortable - PRN Phenergan suppository  Electrolyte Abnormalities. Resolved Hypernatremia and improved Hyperchloridemia. Most likely due to combination of IVFs and poor oral intake - Restarted 0.45NS @ 50cc/hr  Aspiration pneumonia. CXR shows bilateral opacities with a small right pleural effusion suspicious for PNA.  Aspiration will likely continue, and there are no effective interventions for this issue.  Antibiotics are being continued after discussion between PCP and family. WBC is down trending. --Finished Augmentin 500-125mg  bid for a total of 10 days of antibiotics (6/10-20). --O2 therapy as needed -- Follow WBC   Diabetes.  Last A1c 5.8 on 08/10/2017. - CBGs have been well controlled during hospitalization, so will discontinue sSSI and CBG checks  HFpEF with pulm HTN. Stable, acutely elevated blood pressure 151/79 on 6/21 but mostly normal and his BP this am is not high enough to merit any changes in medications. -Cont holding home spironolactone and lasix as BP has been normal on most checks -Monitor blood pressure  Confusion, worsening Increased confusion could be secondary to infection vs delirium in the setting of prolonged hospital course. Family members at bedside with frequent reorientation. Will likely have a  waxing and waning course with probable worsening given terminal trajectory.  FEN/GI: regular PPx: Heparin  Disposition: inpatient  Subjective:  Patient states he feels better. He states his "tailbone" hurts. He did get up and sit in the chair and get repositioned yesterday. He states he has no chest pain and is no longer coughing. He is awake, alert, and remembers me, the current president, and that he is at [facility] in [place] this am.  Objective: Temp:  [97.5 F (36.4 C)] 97.5 F (36.4 C) (06/21 0519) Pulse Rate:  [61-70] 67 (06/21 0519) Resp:  [18-20] 20 (06/21 0519) BP: (123-151)/(58-79) 151/79 (06/21 0519) SpO2:  [97 %-100 %] 97 % (06/21 0519)   Physical Exam: Gen: Alert and Oriented x 3, NAD HEENT: Normocephalic, atraumatic, PERRLA, EOMI, hearing aids in place CV: RRR, no murmurs, normal S1, S2 split, +2 pulses dorsalis pedis bilaterally Resp: CTAB, no wheezing, rales, or rhonchi, comfortable work of breathing Abd: non-distended, non-tender, soft, +bs in all four quadrants MSK: Moves all four extremities Ext: no clubbing, cyanosis, or edema; padding on heels bilaterally Skin: warm, dry, intact, no rashes  Laboratory: Recent Labs  Lab 05/05/18 0952 05/06/18 0409 05/07/18 0455  WBC 20.1* 15.8* 14.0*  HGB 8.8* 8.2* 8.1*  HCT 29.5* 27.4* 27.0*  PLT 284 239 264   Recent Labs  Lab 05/04/18 0724 05/06/18 0409 05/07/18 0455  NA 149* 148* 143  K 4.1 4.3 4.8  CL 116* 117* 113*  CO2 24 23 23   BUN 30* 26* 21*  CREATININE 1.27* 1.02 0.91  CALCIUM 8.0* 7.7* 7.5*  GLUCOSE 166* 103* 84   Lacitc Acid: 2.1  Imaging/Diagnostic Tests:  CT Abd IMPRESSION: 1. Small-bowel  obstruction with transition point in the left abdomen, likely due to adhesions. 2. Colonic diverticulosis. Minimal pericolonic fat stranding and trace fluid in the left pericolic gutter may reflect mild diverticular inflammation/diverticulitis. No perforation or abscess. 3. Mild right  hydronephrosis and proximal ureter without cause obstruction. Findings may be seen with urinary tract infection. Bladder decompressed by Foley catheter with diffuse bladder wall thickening and perivesicular edema. Recommend correlation with Urinalysis.  KUB IMPRESSION: Tip of the enteric tube below the diaphragm in the stomach, side-port likely just beyond the gastroesophageal junction.  No results found.  [name], DO 05/07/2018, 7:19 AM PGY-1,  [place] Intern pager: [phone], text pages welcome

## 2018-05-08 LAB — BASIC METABOLIC PANEL
Anion gap: 6 (ref 5–15)
BUN: 20 mg/dL (ref 6–20)
CHLORIDE: 111 mmol/L (ref 101–111)
CO2: 26 mmol/L (ref 22–32)
CREATININE: 0.95 mg/dL (ref 0.61–1.24)
Calcium: 7.7 mg/dL — ABNORMAL LOW (ref 8.9–10.3)
GFR calc Af Amer: >60 mL/min (ref >60)
GFR calc non Af Amer: >60 mL/min (ref >60)
GLUCOSE: 81 mg/dL (ref 65–99)
POTASSIUM: 4.8 mmol/L (ref 3.5–5.1)
Sodium: 143 mmol/L (ref 135–145)

## 2018-05-08 LAB — CBC
HEMATOCRIT: 25.5 % — AB (ref 39.0–52.0)
Hemoglobin: 7.6 g/dL — ABNORMAL LOW (ref 13.0–17.0)
MCH: 27.8 pg (ref 26.0–34.0)
MCHC: 29.8 g/dL — ABNORMAL LOW (ref 30.0–36.0)
MCV: 93.4 fL (ref 78.0–100.0)
Platelets: 262 10*3/uL (ref 150–400)
RBC: 2.73 MIL/uL — AB (ref 4.22–5.81)
RDW: 18.6 % — ABNORMAL HIGH (ref 11.5–15.5)
WBC: 11.6 10*3/uL — ABNORMAL HIGH (ref 4.0–10.5)

## 2018-05-08 MED ORDER — FENTANYL 12 MCG/HR TD PT72: 12.5000 ug | MEDICATED_PATCH | TRANSDERMAL | 0 refills | Status: AC

## 2018-05-08 NOTE — Progress Notes (Signed)
Family Medicine Teaching Service Daily Progress Note Intern Pager: [phone]  Patient name: [name] Medical record number: [id number] Date of birth: 1920/10/30 Age: 84 y.o. Gender: male  Primary Care Provider: Dickie La, MD Consultants: None Code Status: DNR  Pt Overview and Major Events to Date:  [name] is a 84 y.o. male presenting with abdominal pain. PMH is significant for PAF not on anticoagulation, HTN, T2DM, sinus node dysfunction, CKD III, previous SBO, GERD, diastolic CHF with pulmonary hypertension.  Assessment and Plan: Confusion, improved, stable. Has some waxing and waning throughout hospital stay. Likely has some age related cognitive decline and probable terminal trajectory. Does well with family at bedside and frequent reorientation. Family and patient has meet with palliative in the past. - awaiting transfer back to [facility]  Electrolyte Abnormalities, resolved. BMP WNL today. Was likely due to combination of IVFs and poor oral intake. - Discontinue MIVF   Anemia. Chronic with recent baseline around ~8.5, today down to 7.6 likely dilutional from IVF.  - monitor CBC while admitted - discontinuing IVF as per above  Pneumonia in the setting of chronic aspiration, completed treatment. WBC continues to improve now 11.6 from 14 yesterday. Patient afebrile. Still with crackles on lung exam. PCP has had multiple discussions with patient and family that aspiration will likely continue, and given his desire for thin liquids and po intake in the setting of progressive slow overall decline that there are no effective long term interventions for this issue. - Finished Augmentin 500-125mg  bid for a total of 10 days of antibiotics (6/10-20). - O2 therapy as needed - Monitor WBC   Diabetes.  Last A1c 5.8 on 08/10/2017. CBGs well controlled. - Monitor glucose on BMP  HFpEF with pulm HTN. Stable. BP normotensive -Holding home spironolactone and lasix  -Monitor  blood pressure  SBO, resolved.  - regular diet, thin liquids without thickener per patient and family GOC - PRN Phenergan suppository  FEN/GI: regular diet with thin liquids PPx: Heparin SQ  Disposition: awaiting transfer back to [facility]  Subjective:  States that he feels well today, no acute complaints. Family at bedside inquiring regarding if SNF transfer will occur today.   Objective: Temp:  [97.6 F (36.4 C)-97.7 F (36.5 C)] 97.6 F (36.4 C) (06/22 0522) Pulse Rate:  [72-76] 72 (06/22 0446) Resp:  [18] 18 (06/22 0446) BP: (129-137)/(47-63) 129/47 (06/22 0446) SpO2:  [100 %] 100 % (06/22 0446)   Physical Exam: Gen: Pleasant elderly male sitting up in bed comfortably, in NAD HEENT: Normocephalic, atraumatic, hearing aids in place CV: RRR, no murmurs, normal S1, S2 split Resp: Scattered crackles, normal effort on 3L via [place]. No wheezes or rhonchi. Abd: soft, nontender, nondistended, + bowel sounds MSK: moving all limbs equally Ext: no clubbing, cyanosis, or edema; padding on heels bilaterally Skin: warm, dry, intact, no rashes  Laboratory: Recent Labs  Lab 05/06/18 0409 05/07/18 0455 05/08/18 0321  WBC 15.8* 14.0* 11.6*  HGB 8.2* 8.1* 7.6*  HCT 27.4* 27.0* 25.5*  PLT 239 264 262   Recent Labs  Lab 05/06/18 0409 05/07/18 0455 05/08/18 0321  NA 148* 143 143  K 4.3 4.8 4.8  CL 117* 113* 111  CO2 23 23 26   BUN 26* 21* 20  CREATININE 1.02 0.91 0.95  CALCIUM 7.7* 7.5* 7.7*  GLUCOSE 103* 84 81    Imaging/Diagnostic Tests: CXR 05/04/18 IMPRESSION: 1. Cardiac pacer with lead tips over the right atrium right ventricle. Stable cardiomegaly. 2. Patchy bilateral  pulmonary infiltrates/edema. Small bilateral pleural effusions. Similar findings noted on prior exam.  CT Abd 04/27/18 IMPRESSION: 1. Small-bowel obstruction with transition point in the left abdomen, likely due to adhesions. 2. Colonic diverticulosis. Minimal pericolonic fat stranding and trace  fluid in the left pericolic gutter may reflect mild diverticular inflammation/diverticulitis. No perforation or abscess. 3. Mild right hydronephrosis and proximal ureter without cause obstruction. Findings may be seen with urinary tract infection. Bladder decompressed by Foley catheter with diffuse bladder wall thickening and perivesicular edema. Recommend correlation with Urinalysis.  Bufford Lope, DO 05/08/2018, 9:26 AM PGY-2,  [place] Intern pager: [phone], text pages welcome

## 2018-05-08 NOTE — Progress Notes (Signed)
Patient will DC to: [facility] Anticipated DC date: 05/08/18 Family notified: [name] by: Corey Harold   Per MD patient ready for DC to [facility]. RN, patient, patient's family, and facility notified of DC. Discharge Summary sent to facility. RN given number for report [phone]). DC packet on chart. Ambulance transport requested for patient.   CSW signing off.  Cedric Fishman, LCSW Clinical Social Worker [phone]

## 2018-05-08 NOTE — Progress Notes (Signed)
Report called to [facility]

## 2018-05-08 NOTE — Progress Notes (Signed)
Nsg Discharge Note  Admit Date:  04/27/2018 Discharge date: 05/08/2018   [name] to be D/C'd Skilled nursing facility per MD order.  AVS completed.  Copy for chart, and copy for patient signed, and dated. Patient/caregiver able to verbalize understanding.  Discharge Medication: Allergies as of 05/08/2018      Reactions   Codeine Nausea Only      Medication List    STOP taking these medications   acetaminophen 325 MG tablet Commonly known as:  TYLENOL   acetaminophen 650 MG suppository Commonly known as:  TYLENOL   HYDROmorphone 2 MG tablet Commonly known as:  DILAUDID   morphine 2 MG/ML injection   nebivolol 5 MG tablet Commonly known as:  BYSTOLIC   silodosin 4 MG Caps capsule Commonly known as:  RAPAFLO     TAKE these medications   bisacodyl 10 MG suppository Commonly known as:  DULCOLAX Place 1 suppository (10 mg total) rectally 2 (two) times daily. What changed:    when to take this  reasons to take this   docusate sodium 100 MG capsule Commonly known as:  COLACE Take 100 mg by mouth daily as needed for mild constipation.   feeding supplement (PRO-STAT SUGAR FREE 64) Liqd Take 30 mLs by mouth daily.   fentaNYL 12 MCG/HR Commonly known as:  DURAGESIC - dosed mcg/hr Place 1 patch (12.5 mcg total) onto the skin every 3 (three) days.   furosemide 40 MG tablet Commonly known as:  LASIX Take 40 mg by mouth 2 (two) times daily.   magnesium hydroxide 400 MG/5ML suspension Commonly known as:  MILK OF MAGNESIA Take 30 mLs by mouth once.   multivitamin with minerals Tabs tablet Take 1 tablet by mouth daily.   pantoprazole 40 MG tablet Commonly known as:  PROTONIX Take 1 tablet (40 mg total) by mouth daily before breakfast.   polyethylene glycol packet Commonly known as:  MIRALAX / GLYCOLAX Take 17 g by mouth daily.   promethazine 25 MG suppository Commonly known as:  PHENERGAN Place 25 mg rectally every 12 (twelve) hours as needed for nausea  or vomiting.   senna-docusate 8.6-50 MG tablet Commonly known as:  Senokot-S Take 1 tablet by mouth 2 (two) times daily. What changed:    how much to take  when to take this   spironolactone 25 MG tablet Commonly known as:  ALDACTONE Take 25 mg by mouth daily.       Discharge Assessment: Vitals:   05/08/18 0522 05/08/18 1255  BP:  (!) 121/50  Pulse:  65  Resp:  18  Temp: 97.6 F (36.4 C) 97.6 F (36.4 C)  SpO2:  100%  IV catheter discontinued intact. Site without signs and symptoms of complications - no redness or edema noted at insertion site, patient denies c/o pain - only slight tenderness at site.  Dressing with slight pressure applied.  D/c Instructions-Education: Discharge instructions given to patient/family with verbalized understanding. D/c education completed with patient/family including follow up instructions, medication list, d/c activities limitations if indicated, with other d/c instructions as indicated by MD - patient able to verbalize understanding, all questions fully answered. Patient instructed to return to ED, call 911, or call MD for any changes in condition.  Patient discharged via [facility] to [facility]  Samuel N Yaron Grasse, RN 05/08/2018 4:19 PM

## 2018-07-18 DEATH — deceased

## 2018-07-26 ENCOUNTER — Encounter: Payer: Medicare Other | Admitting: *Deleted

## 2018-07-26 ENCOUNTER — Telehealth: Payer: Self-pay

## 2018-07-26 NOTE — Telephone Encounter (Signed)
Confirmed remote transmission w/ pt daughter.  Pt has passed away.
